# Patient Record
Sex: Female | Born: 1960 | Race: Black or African American | Hispanic: No | Marital: Single | Smoking: Former smoker
Health system: Southern US, Community
[De-identification: ages and names within clinical notes are randomized; demographics above are authoritative.]

## PROBLEM LIST (undated history)

## (undated) DIAGNOSIS — F32A Depression, unspecified: Secondary | ICD-10-CM

## (undated) DIAGNOSIS — S14124A Central cord syndrome at C4 level of cervical spinal cord, initial encounter: Secondary | ICD-10-CM

## (undated) DIAGNOSIS — R569 Unspecified convulsions: Secondary | ICD-10-CM

---

## 2007-02-08 ENCOUNTER — Emergency Department: Payer: Self-pay | Admitting: Emergency Medicine

## 2008-09-01 ENCOUNTER — Emergency Department: Payer: Self-pay | Admitting: Emergency Medicine

## 2010-11-15 ENCOUNTER — Emergency Department: Payer: Self-pay | Admitting: Emergency Medicine

## 2010-11-15 IMAGING — CR RIGHT FOOT - 2 VIEW
1 series · 2 of 2 positions shown · non-contrast
Comparison: none

REASON FOR EXAM: pain right lateral foot s/p fall yesterday
COMMENTS:   LMP: Post-Menopausal

PROCEDURE:     DXR - DXR FOOT RIGHT AP AND LATERAL  - [DATE]  [DATE]
RESULT:     No fracture, dislocation or other acute bony abnormality is
identified. Small plantar and Achilles calcaneal spurs are noted.

[Series 1: view not recorded · 0.17mm/px · 2 of 2 slices shown]
[im 1/2]
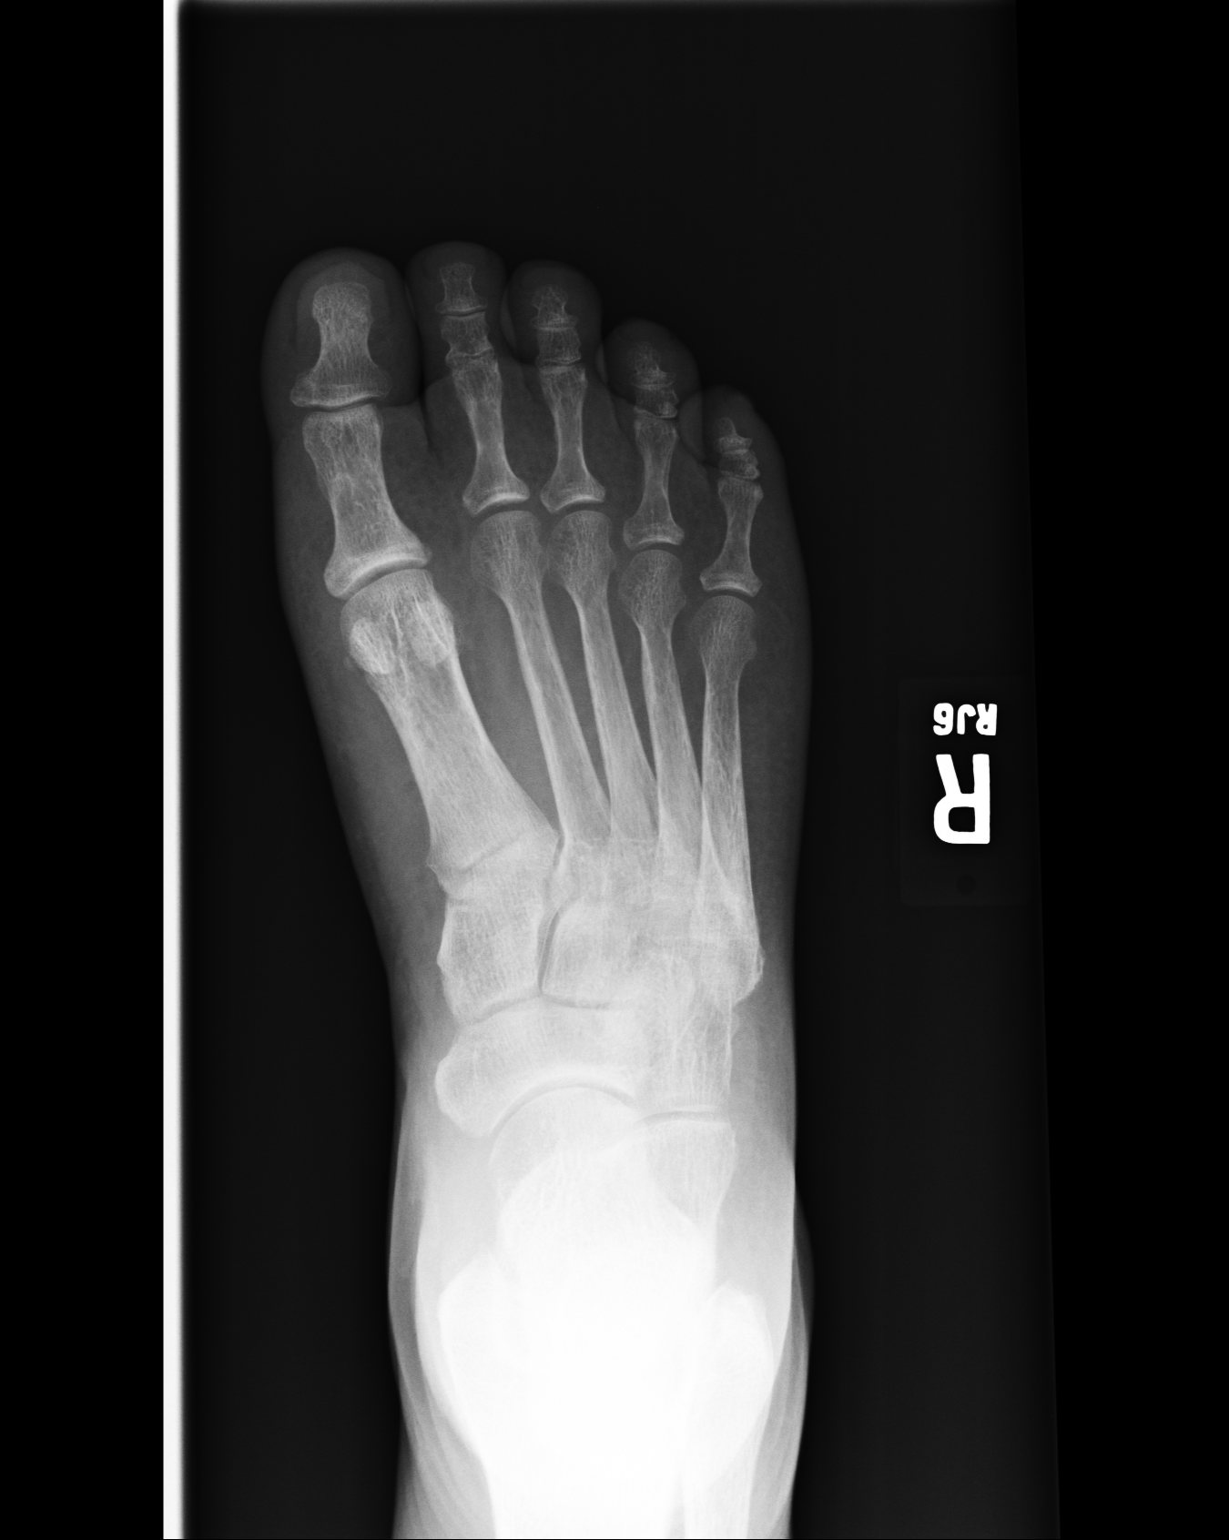
[im 2/2]
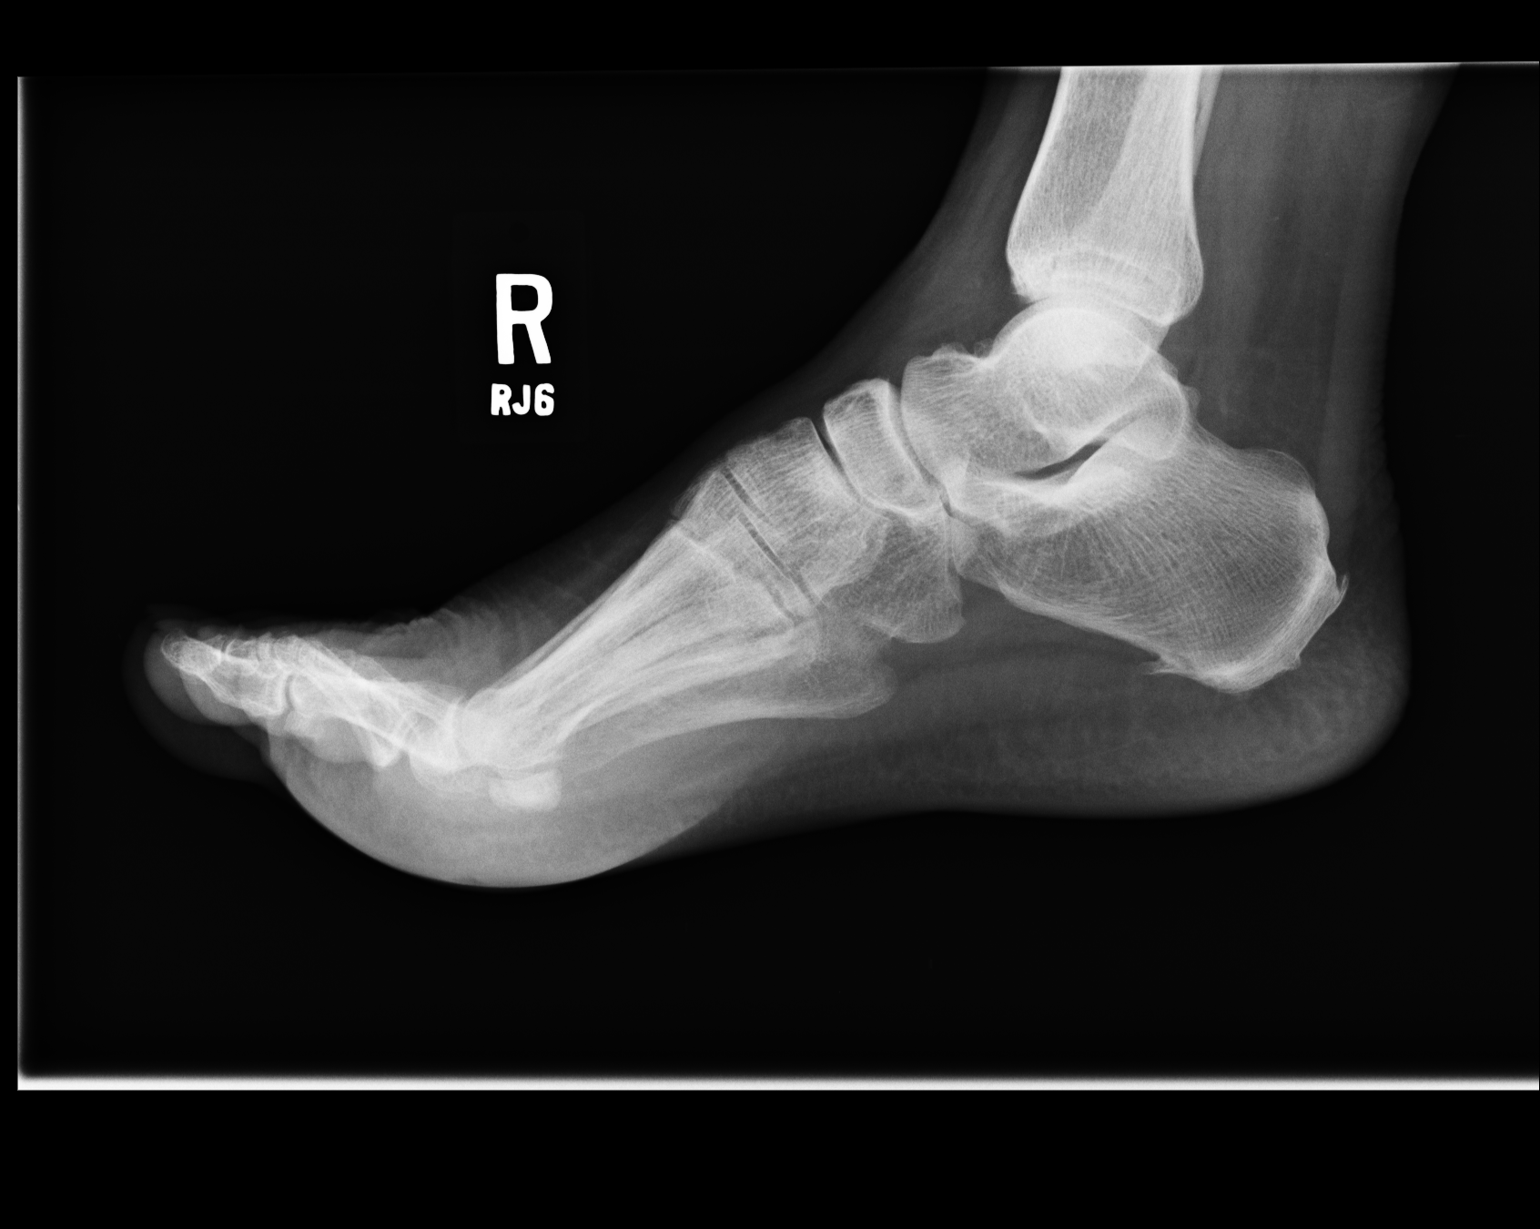

[2 of 2 positions shown; findings below may reference images not displayed]

IMPRESSION: 1.  No acute bony abnormalities are seen.
2.  No radiodense soft tissue foreign body is seen.
3.  Small plantar and Achilles calcaneal spurs are noted.

## 2013-01-05 ENCOUNTER — Emergency Department: Payer: Self-pay | Admitting: Emergency Medicine

## 2013-10-26 ENCOUNTER — Emergency Department: Payer: Self-pay | Admitting: Emergency Medicine

## 2017-12-30 ENCOUNTER — Other Ambulatory Visit: Payer: Self-pay

## 2017-12-30 ENCOUNTER — Emergency Department
Admission: EM | Admit: 2017-12-30 | Discharge: 2017-12-30 | Disposition: A | Discharge status: Other Acute Inpatient Hospital | Payer: Self-pay | Attending: Emergency Medicine | Admitting: Emergency Medicine

## 2017-12-30 DIAGNOSIS — X58XXXA Exposure to other specified factors, initial encounter: Secondary | ICD-10-CM | POA: Insufficient documentation

## 2017-12-30 DIAGNOSIS — T578X1A Toxic effect of other specified inorganic substances, accidental (unintentional), initial encounter: Secondary | ICD-10-CM | POA: Insufficient documentation

## 2017-12-30 DIAGNOSIS — F17200 Nicotine dependence, unspecified, uncomplicated: Secondary | ICD-10-CM | POA: Insufficient documentation

## 2017-12-30 DIAGNOSIS — R600 Localized edema: Secondary | ICD-10-CM

## 2017-12-30 DIAGNOSIS — Y999 Unspecified external cause status: Secondary | ICD-10-CM | POA: Insufficient documentation

## 2017-12-30 DIAGNOSIS — Y939 Activity, unspecified: Secondary | ICD-10-CM | POA: Insufficient documentation

## 2017-12-30 DIAGNOSIS — Y929 Unspecified place or not applicable: Secondary | ICD-10-CM | POA: Insufficient documentation

## 2017-12-30 DIAGNOSIS — T304 Corrosion of unspecified body region, unspecified degree: Secondary | ICD-10-CM

## 2017-12-30 DIAGNOSIS — T2065XA Corrosion of second degree of scalp [any part], initial encounter: Secondary | ICD-10-CM | POA: Insufficient documentation

## 2017-12-30 LAB — CBC WITH DIFFERENTIAL/PLATELET
BASOS PCT: 1 %
Basophils Absolute: 0 10*3/uL (ref 0–0.1)
EOS ABS: 0.5 10*3/uL (ref 0–0.7)
Eosinophils Relative: 5 %
HCT: 43 % (ref 35.0–47.0)
HEMOGLOBIN: 14.8 g/dL (ref 12.0–16.0)
Lymphocytes Relative: 9 %
Lymphs Abs: 0.8 10*3/uL — ABNORMAL LOW (ref 1.0–3.6)
MCH: 35.1 pg — ABNORMAL HIGH (ref 26.0–34.0)
MCHC: 34.5 g/dL (ref 32.0–36.0)
MCV: 101.7 fL — ABNORMAL HIGH (ref 80.0–100.0)
Monocytes Absolute: 0.9 10*3/uL (ref 0.2–0.9)
Monocytes Relative: 10 %
NEUTROS PCT: 75 %
Neutro Abs: 6.7 10*3/uL — ABNORMAL HIGH (ref 1.4–6.5)
Platelets: 243 10*3/uL (ref 150–440)
RBC: 4.22 MIL/uL (ref 3.80–5.20)
RDW: 13.5 % (ref 11.5–14.5)
WBC: 8.9 10*3/uL (ref 3.6–11.0)

## 2017-12-30 LAB — BASIC METABOLIC PANEL
Anion gap: 6 (ref 5–15)
BUN: 14 mg/dL (ref 6–20)
CHLORIDE: 102 mmol/L (ref 98–111)
CO2: 29 mmol/L (ref 22–32)
Calcium: 9.3 mg/dL (ref 8.9–10.3)
Creatinine, Ser: 1.02 mg/dL — ABNORMAL HIGH (ref 0.44–1.00)
GFR calc non Af Amer: 59 mL/min — ABNORMAL LOW (ref >60)
Glucose, Bld: 96 mg/dL (ref 70–99)
POTASSIUM: 4.1 mmol/L (ref 3.5–5.1)
SODIUM: 137 mmol/L (ref 135–145)

## 2017-12-30 MED ORDER — ONDANSETRON HCL 4 MG/2ML IJ SOLN
4.0000 mg | Freq: Once | INTRAMUSCULAR | Status: AC
  Administered 2017-12-30: 4 mg via INTRAVENOUS
  Filled 2017-12-30: qty 2

## 2017-12-30 MED ORDER — SODIUM CHLORIDE 0.9 % IV BOLUS
1000.0000 mL | Freq: Once | INTRAVENOUS | Status: AC
  Administered 2017-12-30: 1000 mL via INTRAVENOUS

## 2017-12-30 MED ORDER — TETANUS-DIPHTH-ACELL PERTUSSIS 5-2.5-18.5 LF-MCG/0.5 IM SUSP
0.5000 mL | Freq: Once | INTRAMUSCULAR | Status: AC
  Administered 2017-12-30: 0.5 mL via INTRAMUSCULAR
  Filled 2017-12-30: qty 0.5

## 2017-12-30 MED ORDER — FENTANYL CITRATE (PF) 100 MCG/2ML IJ SOLN
50.0000 ug | Freq: Once | INTRAMUSCULAR | Status: AC
  Administered 2017-12-30: 50 ug via INTRAVENOUS
  Filled 2017-12-30: qty 2

## 2017-12-30 NOTE — ED Triage Notes (Signed)
Pt c/o having an allergic reaction to a hair die that she used on Sunday .the patient has sever facial swelling. Denies any difficulty breathing

## 2017-12-30 NOTE — ED Provider Notes (Signed)
[facility] Emergency Department Provider Note ____________________________________________   First MD Initiated Contact with Patient 12/30/17 [phone]     (approximate)  I have reviewed the triage vital signs and the nursing notes.   HISTORY  Chief Complaint Allergic Reaction  HPI Yvette Phillips is a 57 y.o. female without any chronic medical conditions was presented to the emergency department today with a burn to her scalp.  Said that she used a hair dye this past Sunday which she used several years ago and had a more mild reaction.  Says that she washed her [place] but then started having a burning sensation to the scalp.  The burning has progressed and her scalp is weeping.  She also has pain to her right ear and weeping from the right ear.  This morning she came to the emergency department because her eyes have swollen shut.  She describes the pain as a burning pain.  Does not know the date of her last tetanus shot.   History reviewed. No pertinent past medical history.  There are no active problems to display for this patient.   History reviewed. No pertinent surgical history.  Prior to Admission medications   Not on File    Allergies Codeine and Penicillins  No family history on file.  Social History Social History   Tobacco Use  . Smoking status: Current Every Day Smoker  . Smokeless tobacco: Never Used  Substance Use Topics  . Alcohol use: Yes    Comment: occassionally  . Drug use: Not Currently    Review of Systems  Constitutional: No fever/chills Eyes: No visual changes. ENT: No sore throat. Cardiovascular: Denies chest pain. Respiratory: Denies shortness of breath. Gastrointestinal: No abdominal pain.  No nausea, no vomiting.  No diarrhea.  No constipation. Genitourinary: Negative for dysuria. Musculoskeletal: Negative for back pain. Skin: As above Neurological: Negative for headaches, focal weakness or  numbness.   ____________________________________________   PHYSICAL EXAM:  VITAL SIGNS: ED Triage Vitals  Enc Vitals Group     BP 12/30/17 0738 138/83     Pulse Rate 12/30/17 0738 90     Resp 12/30/17 0738 18     Temp 12/30/17 0738 98.8 F (37.1 C)     Temp Source 12/30/17 0738 Oral     SpO2 12/30/17 0738 100 %     Weight 12/30/17 0739 230 lb (104.3 kg)     Height 12/30/17 0739 5\' 7"  (1.702 m)     Head Circumference --      Peak Flow --      Pain Score 12/30/17 0739 6     Pain Loc --      Pain Edu? --      Excl. in GC? --     Constitutional: Alert and oriented. Well appearing and in no acute distress. Eyes: Severely edematous periorbital swelling.  Able to open the eyes, bilaterally and visualize the pupils which are 3 and reactive bilaterally.  Patient denies any blurred vision. Head: See skin exam below. Nose: No congestion/rhinnorhea. Mouth/Throat: Mucous membranes are moist.  Neck: No stridor.   Cardiovascular: Normal rate, regular rhythm. Grossly normal heart sounds.   Respiratory: Normal respiratory effort.  No retractions. Lungs CTAB. Gastrointestinal: Soft and nontender. No distention. No CVA tenderness. Musculoskeletal: No lower extremity tenderness nor edema.  No joint effusions. Neurologic:  Normal speech and language. No gross focal neurologic deficits are appreciated. Skin: Weeping scalp diffusely.  Severely swollen superior aspect of the right pinna which  is weeping and tense.  Scalp is erythematous.  I do not see any bullae.  The erythema extends down about 3 cm onto the forehead. Psychiatric: Mood and affect are normal. Speech and behavior are normal.  ____________________________________________   LABS (all labs ordered are listed, but only abnormal results are displayed)  Labs Reviewed  CBC WITH DIFFERENTIAL/PLATELET  BASIC METABOLIC PANEL    ____________________________________________  EKG   ____________________________________________  RADIOLOGY   ____________________________________________   PROCEDURES  Procedure(s) performed:   Procedures  Critical Care performed:   ____________________________________________   INITIAL IMPRESSION / ASSESSMENT AND PLAN / ED COURSE  Pertinent labs & imaging results that were available during my care of the patient were reviewed by me and considered in my medical decision making (see chart for details).  DDX: Chemical burn, allergic reaction, first-degree burn, second-degree burn, burn of the right ear. As part of my medical decision making, I reviewed the following data within the electronic MEDICAL RECORD NUMBER Notes from prior ED visits  ----------------------------------------- 8:25 AM on 12/30/2017 -----------------------------------------  Discussed the case with the burn fellow, Dr.Sljizic, who [facility]  Accepting doctor is Dr. Yetta BarreJones.  Patient given tetanus shot as well as pain control.  She is aware of her disposition and willing to comply. ____________________________________________   FINAL CLINICAL IMPRESSION(S) / ED DIAGNOSES  Chemical burn.  Periorbital edema.  NEW MEDICATIONS STARTED DURING THIS VISIT:  New Prescriptions   No medications on file     Note:  This document was prepared using Dragon voice recognition software and may include unintentional dictation errors.     Myrna BlazerSchaevitz, Daeshawn Redmann Matthew, MD 12/30/17 [phone]

## 2017-12-30 NOTE — ED Notes (Signed)
[facility]  CALLED  FOR  TRANSPORT 

## 2017-12-30 NOTE — ED Notes (Signed)
EMTALA reviewed by charge RN 

## 2020-05-21 DIAGNOSIS — F1010 Alcohol abuse, uncomplicated: Secondary | ICD-10-CM | POA: Diagnosis present

## 2020-05-21 DIAGNOSIS — S14129A Central cord syndrome at unspecified level of cervical spinal cord, initial encounter: Secondary | ICD-10-CM | POA: Diagnosis present

## 2020-05-31 ENCOUNTER — Emergency Department
Admission: EM | Admit: 2020-05-31 | Discharge: 2020-05-31 | Disposition: A | Discharge status: Home / Self Care | Payer: Medicaid Other | Attending: Emergency Medicine | Admitting: Emergency Medicine

## 2020-05-31 ENCOUNTER — Other Ambulatory Visit: Payer: Self-pay

## 2020-05-31 ENCOUNTER — Emergency Department: Payer: Medicaid Other

## 2020-05-31 DIAGNOSIS — R531 Weakness: Secondary | ICD-10-CM | POA: Insufficient documentation

## 2020-05-31 DIAGNOSIS — F17200 Nicotine dependence, unspecified, uncomplicated: Secondary | ICD-10-CM | POA: Insufficient documentation

## 2020-05-31 DIAGNOSIS — M25562 Pain in left knee: Secondary | ICD-10-CM | POA: Insufficient documentation

## 2020-05-31 DIAGNOSIS — W19XXXA Unspecified fall, initial encounter: Secondary | ICD-10-CM

## 2020-05-31 DIAGNOSIS — S50311A Abrasion of right elbow, initial encounter: Secondary | ICD-10-CM | POA: Diagnosis not present

## 2020-05-31 DIAGNOSIS — W010XXA Fall on same level from slipping, tripping and stumbling without subsequent striking against object, initial encounter: Secondary | ICD-10-CM | POA: Insufficient documentation

## 2020-05-31 DIAGNOSIS — S50312A Abrasion of left elbow, initial encounter: Secondary | ICD-10-CM | POA: Insufficient documentation

## 2020-05-31 LAB — BASIC METABOLIC PANEL
Anion gap: 8 (ref 5–15)
BUN: 16 mg/dL (ref 8–23)
CO2: 26 mmol/L (ref 22–32)
Calcium: 9.2 mg/dL (ref 8.9–10.3)
Chloride: 104 mmol/L (ref 98–111)
Creatinine, Ser: 0.68 mg/dL (ref 0.44–1.00)
GFR, Estimated: >60 mL/min (ref >60)
Glucose, Bld: 113 mg/dL — ABNORMAL HIGH (ref 70–99)
Potassium: 3.9 mmol/L (ref 3.5–5.1)
Sodium: 138 mmol/L (ref 135–145)

## 2020-05-31 LAB — CBC
HCT: 44.8 % (ref 36.0–46.0)
Hemoglobin: 15 g/dL (ref 12.0–15.0)
MCH: 38 pg — ABNORMAL HIGH (ref 26.0–34.0)
MCHC: 33.5 g/dL (ref 30.0–36.0)
MCV: 113.4 fL — ABNORMAL HIGH (ref 80.0–100.0)
Platelets: 338 10*3/uL (ref 150–400)
RBC: 3.95 MIL/uL (ref 3.87–5.11)
RDW: 13.6 % (ref 11.5–15.5)
WBC: 6.7 10*3/uL (ref 4.0–10.5)
nRBC: 0 % (ref 0.0–0.2)

## 2020-05-31 IMAGING — DX DG KNEE COMPLETE 4+V*L*
4 series · 4 of 4 positions shown · non-contrast
Comparison: None.

CLINICAL DATA: Left knee pain, fell from standing at 4 p.m.

EXAM:
LEFT KNEE - COMPLETE 4+ VIEW

[knee ap]
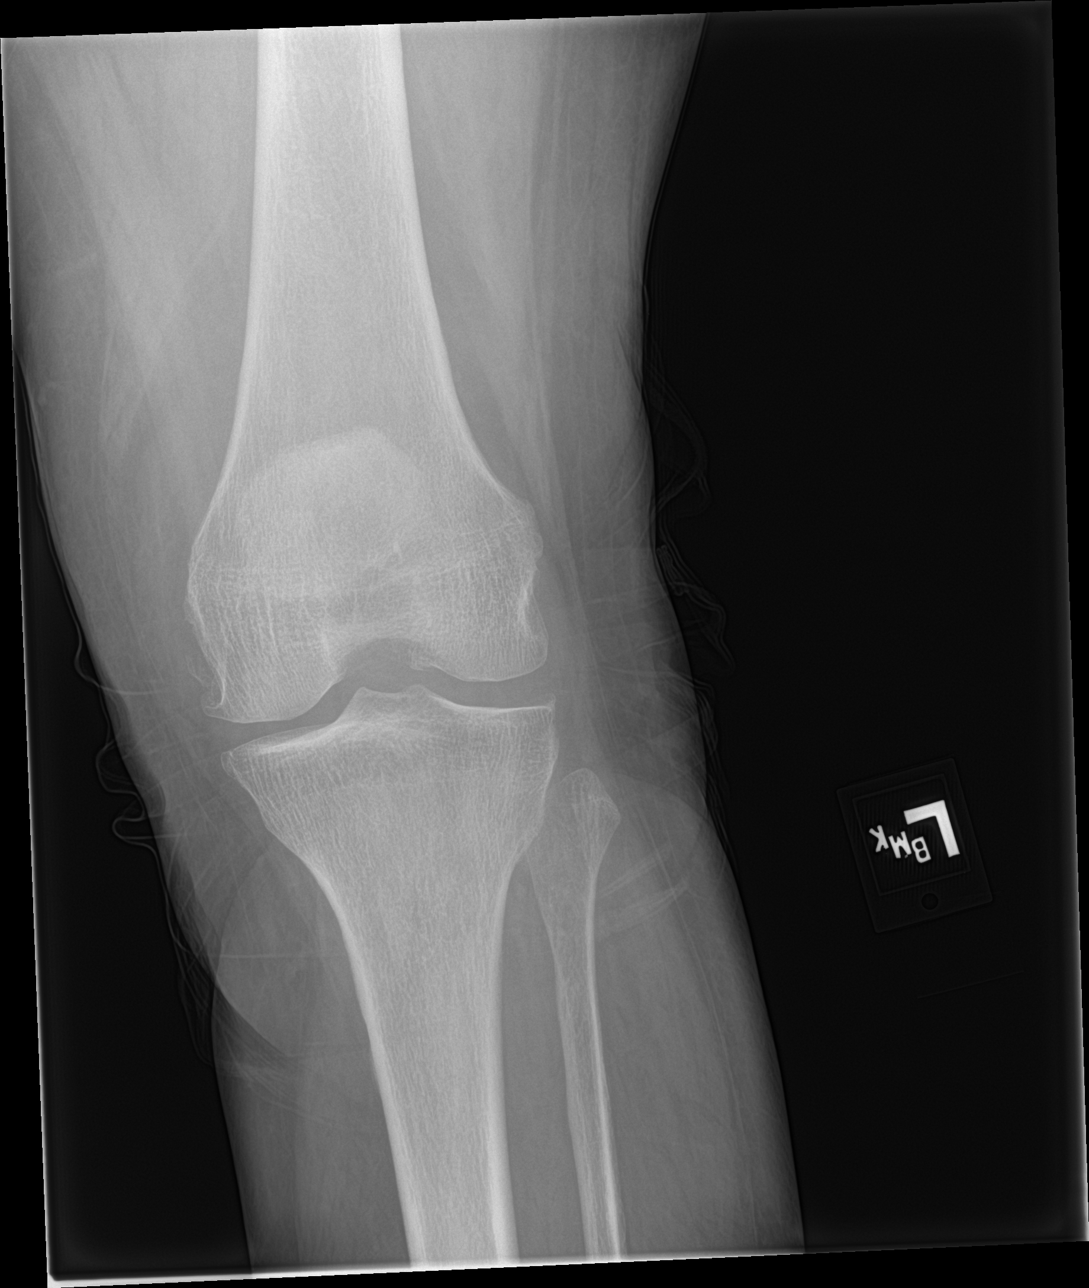

[knee tunnel]
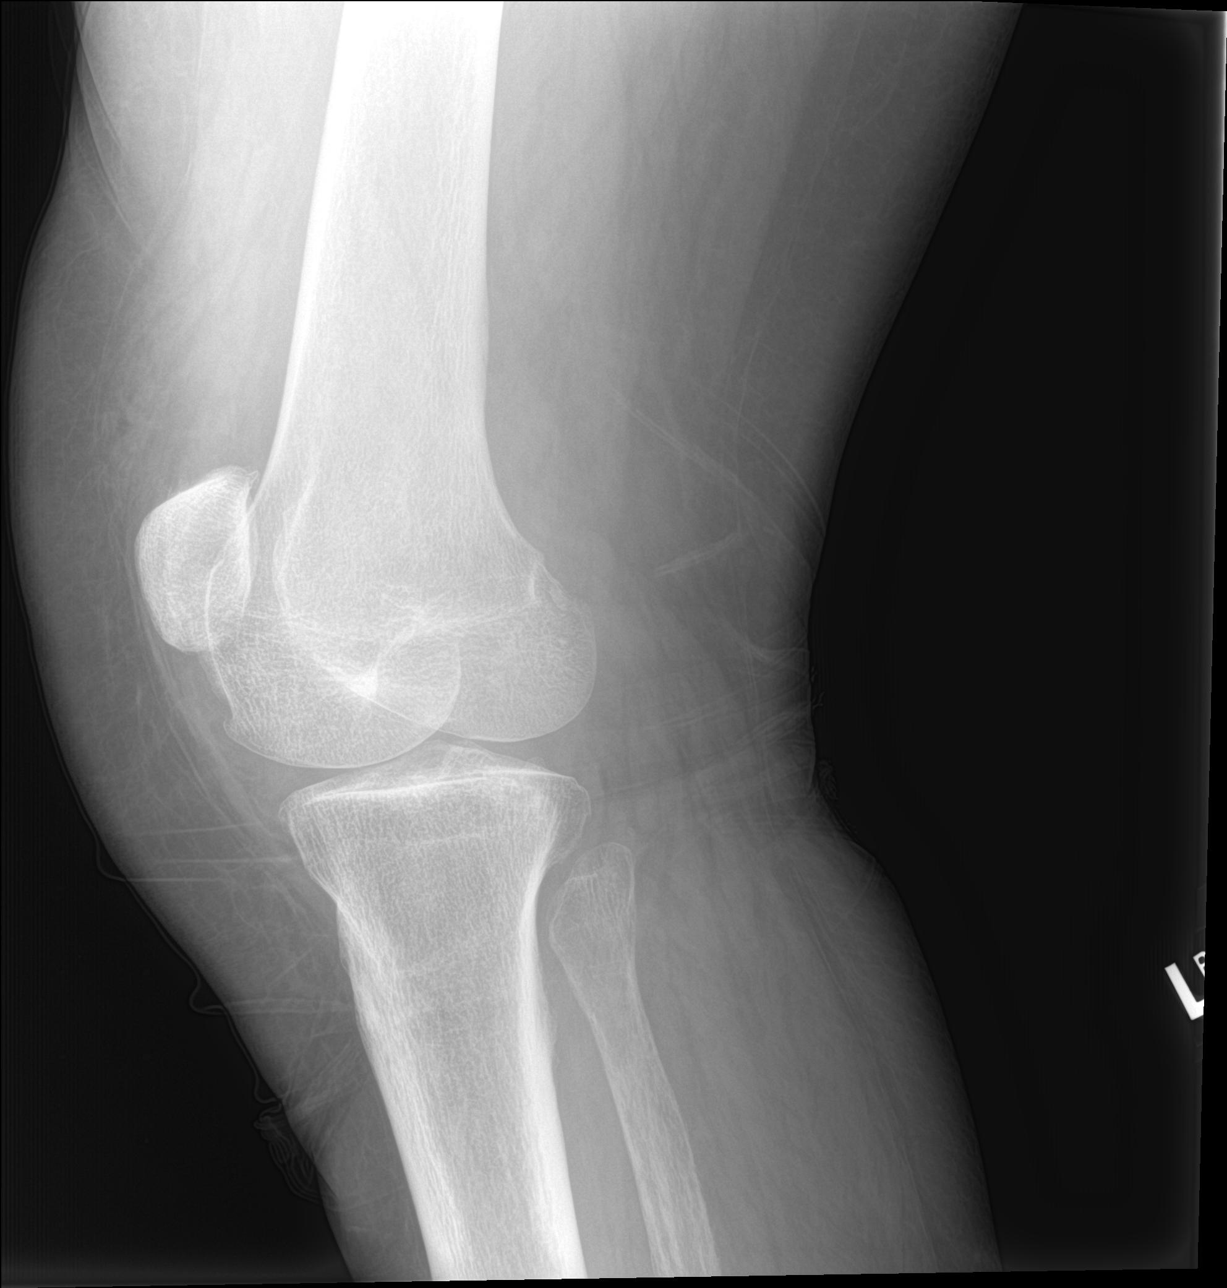

[knee lat]
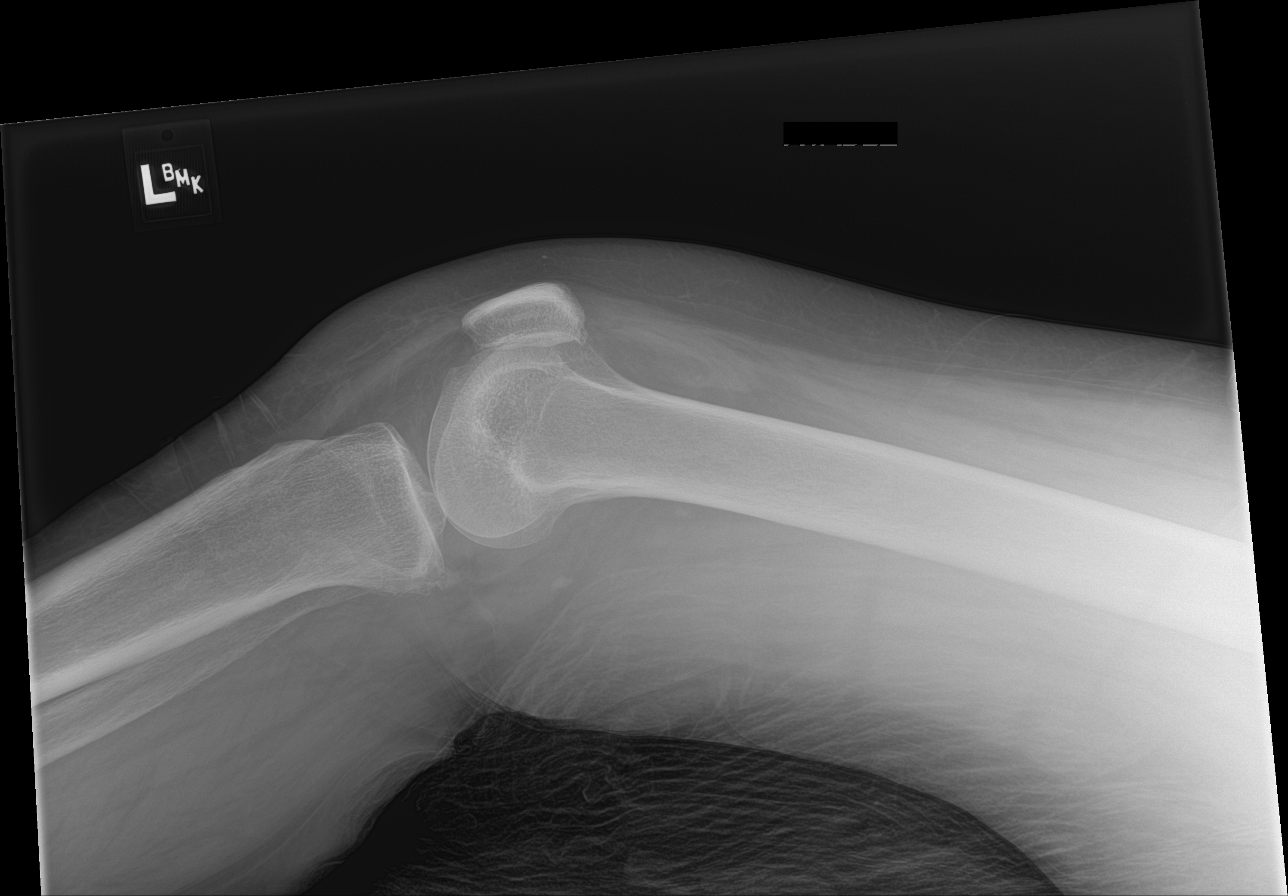

[knee obl]
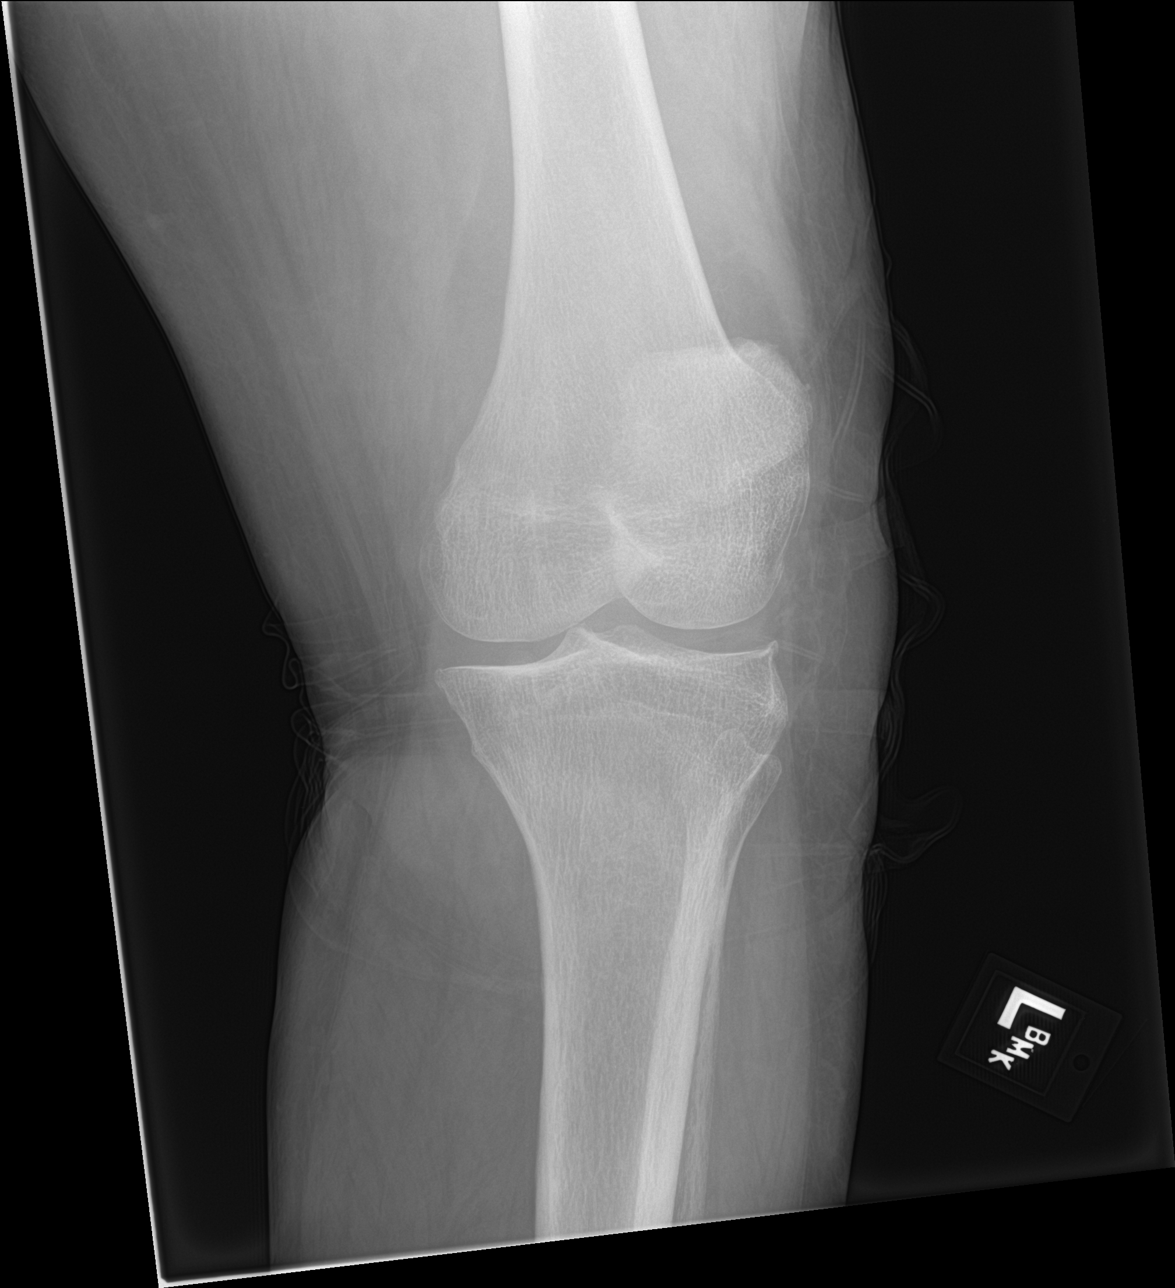

[4 of 4 positions shown; findings below may reference images not displayed]

FINDINGS: Frontal, bilateral oblique, and cross-table lateral views of the
left knee are obtained. There is mild 3 compartmental osteoarthritis
greatest in the medial compartment. No fracture, subluxation, or
dislocation. Trace joint effusion is likely reactive. Soft tissues
are otherwise unremarkable.
IMPRESSION: 1. Mild 3 compartmental osteoarthritis with trace reactive joint
effusion.
2. No acute fracture.

## 2020-05-31 MED ORDER — ACETAMINOPHEN 500 MG PO TABS
1000.0000 mg | ORAL_TABLET | Freq: Once | ORAL | Status: AC
  Administered 2020-05-31: 1000 mg via ORAL
  Filled 2020-05-31: qty 2

## 2020-05-31 NOTE — ED Triage Notes (Signed)
Pt to ED for chief complaint of fall and right sided weakness since christmas day when discharged from [facility] Denies hitting head with fall or blood thinners  Pt alert and oriented, speaking in complete sentences, clear speech.    Pt states she feels the same as when she left [facility], not worse. Discussed pt with DR Siadecki. No head CT at this time

## 2020-05-31 NOTE — ED Notes (Signed)
ED Provider at bedside. 

## 2020-05-31 NOTE — ED Triage Notes (Signed)
Pt comes into the ED via EMS from home with c/o trip and fall with c/o BL knee pain, right worse then left. States she was recently seen at [facility] for the right leg weakness a week ago and did an MRI and told her it was a pinched nerve. 85HR 140/70 100%RA

## 2020-05-31 NOTE — Discharge Instructions (Signed)
Please seek medical attention for any high fevers, chest pain, shortness of breath, change in behavior, persistent vomiting, bloody stool or any other new or concerning symptoms.  

## 2020-05-31 NOTE — ED Provider Notes (Signed)
[facility] Emergency Department Provider Note   ____________________________________________   I have reviewed the triage vital signs and the nursing notes.   HISTORY  Chief Complaint Left knee pain  History limited by: Not Limited   HPI Yvette Phillips is a 60 y.o. female who presents to the emergency department today with primary complaint of left knee pain after a fall. The patient states that she has been having weakness since an admission to [facility]. The patient states that her weakness has been the same as it was when she left the hospital. The patient denies hitting her head with the fall today. Denies any chest pain or shortness of breath.   Records reviewed. Per medical record review patient has a history of recent admission to [facility]. 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 Was evaluated by neurosurgery there who felt outpatient follow up was appropriate.    Prior to Admission medications   Not on File    Allergies Codeine and Penicillins  No family history on file.  Social History Social History   Tobacco Use  . Smoking status: Current Every Day Smoker  . Smokeless tobacco: Never Used  Substance Use Topics  . Alcohol use: Yes    Comment: occassionally  . Drug use: Not Currently    Review of Systems Constitutional: No fever/chills Eyes: No visual changes. ENT: No sore throat. Cardiovascular: Denies chest pain. Respiratory: Denies shortness of breath. Gastrointestinal: No abdominal pain.  No nausea, no vomiting.  No diarrhea.   Genitourinary: Negative for dysuria. Musculoskeletal: Positive for left knee pain. Skin: Negative for rash. Neurological: Positive for right arm numbness and weakness.   ____________________________________________   PHYSICAL EXAM:  VITAL SIGNS: ED Triage Vitals  Enc Vitals Group     BP 05/31/20 1558 135/79     Pulse Rate 05/31/20 1558 86     Resp  05/31/20 1558 20     Temp 05/31/20 1558 98.1 F (36.7 C)     Temp Source 05/31/20 1558 Oral     SpO2 05/31/20 1558 99 %     Weight 05/31/20 1602 230 lb (104.3 kg)     Height 05/31/20 1602 5\' 7"  (1.702 m)     Head Circumference --      Peak Flow --      Pain Score 05/31/20 1602 0   Constitutional: Alert and oriented.  Eyes: Conjunctivae are normal.  ENT      Head: Normocephalic and atraumatic.      Nose: No congestion/rhinnorhea.      Mouth/Throat: Mucous membranes are moist.      Neck: No stridor. Hematological/Lymphatic/Immunilogical: No cervical lymphadenopathy. Cardiovascular: Normal rate, regular rhythm.  No murmurs, rubs, or gallops.  Respiratory: Normal respiratory effort without tachypnea nor retractions. Breath sounds are clear and equal bilaterally. No wheezes/rales/rhonchi. Gastrointestinal: Soft and non tender. No rebound. No guarding.  Genitourinary: Deferred Musculoskeletal: Normal range of motion in all extremities. Small effusion to left knee. No erythema.  Neurologic:  Strength decreased in right upper arm.  Skin: Abrasion to bilateral elbows  Psychiatric: Mood and affect are normal. Speech and behavior are normal. Patient exhibits appropriate insight and judgment.  ____________________________________________    LABS (pertinent positives/negatives)  BMP wnl except glu 113 CBC wbc 6.7, hgb 15.0, plt 338  ____________________________________________   EKG  I, 07/29/20, attending physician, personally viewed and interpreted this EKG  EKG Time: 1600 Rate: 80 Rhythm: normal sinus rhythm  Axis: left axis deviation Intervals: qtc 440 QRS: narrow, q waves v1, LVH ST changes: no st elevation Impression: abnormal ekg   ____________________________________________    RADIOLOGY  None  ____________________________________________   PROCEDURES  Procedures  ____________________________________________   INITIAL IMPRESSION / ASSESSMENT AND  PLAN / ED COURSE  Pertinent labs & imaging results that were available during my care of the patient were reviewed by me and considered in my medical decision making (see chart for details).   Patient presented to the emergency department today because of concerns for left knee pain after a fall.  On exam patient does have a small effusion to the left knee.  X-ray was performed and not show any acute fracture or dislocation.  Patient does have some weakness but this has been constant since her recent admission to [facility].  Per chart review she was evaluated by neurosurgery and they are planning on following up with her as an outpatient.  I did reinforce with patient importance of neurosurgery follow-up.   ____________________________________________   FINAL CLINICAL IMPRESSION(S) / ED DIAGNOSES  Final diagnoses:  Acute pain of left knee  Fall, initial encounter     Note: This dictation was prepared with Nurse, children's dictation. Any transcriptional errors that result from this process are unintentional     Phineas Semen, MD 05/31/20 [phone]

## 2020-07-03 ENCOUNTER — Emergency Department: Payer: Medicaid Other

## 2020-07-03 ENCOUNTER — Inpatient Hospital Stay
Admission: EM | Admit: 2020-07-03 | Discharge: 2020-07-11 | DRG: 029 | Disposition: A | Discharge status: Other Acute Inpatient Hospital | Payer: Medicaid Other | Attending: Internal Medicine | Admitting: Internal Medicine

## 2020-07-03 ENCOUNTER — Other Ambulatory Visit: Payer: Self-pay

## 2020-07-03 DIAGNOSIS — E876 Hypokalemia: Secondary | ICD-10-CM | POA: Diagnosis not present

## 2020-07-03 DIAGNOSIS — R030 Elevated blood-pressure reading, without diagnosis of hypertension: Secondary | ICD-10-CM | POA: Diagnosis present

## 2020-07-03 DIAGNOSIS — S14124A Central cord syndrome at C4 level of cervical spinal cord, initial encounter: Secondary | ICD-10-CM | POA: Diagnosis present

## 2020-07-03 DIAGNOSIS — R296 Repeated falls: Secondary | ICD-10-CM | POA: Diagnosis present

## 2020-07-03 DIAGNOSIS — F1910 Other psychoactive substance abuse, uncomplicated: Secondary | ICD-10-CM | POA: Diagnosis present

## 2020-07-03 DIAGNOSIS — L8990 Pressure ulcer of unspecified site, unspecified stage: Secondary | ICD-10-CM | POA: Insufficient documentation

## 2020-07-03 DIAGNOSIS — Z716 Tobacco abuse counseling: Secondary | ICD-10-CM | POA: Diagnosis not present

## 2020-07-03 DIAGNOSIS — W19XXXA Unspecified fall, initial encounter: Secondary | ICD-10-CM | POA: Diagnosis present

## 2020-07-03 DIAGNOSIS — R531 Weakness: Secondary | ICD-10-CM | POA: Diagnosis present

## 2020-07-03 DIAGNOSIS — R1319 Other dysphagia: Secondary | ICD-10-CM | POA: Diagnosis not present

## 2020-07-03 DIAGNOSIS — M50021 Cervical disc disorder at C4-C5 level with myelopathy: Secondary | ICD-10-CM | POA: Diagnosis present

## 2020-07-03 DIAGNOSIS — S14154A Other incomplete lesion at C4 level of cervical spinal cord, initial encounter: Secondary | ICD-10-CM | POA: Diagnosis present

## 2020-07-03 DIAGNOSIS — Z7151 Drug abuse counseling and surveillance of drug abuser: Secondary | ICD-10-CM

## 2020-07-03 DIAGNOSIS — F17210 Nicotine dependence, cigarettes, uncomplicated: Secondary | ICD-10-CM | POA: Diagnosis present

## 2020-07-03 DIAGNOSIS — Z6836 Body mass index (BMI) 36.0-36.9, adult: Secondary | ICD-10-CM

## 2020-07-03 DIAGNOSIS — Z20822 Contact with and (suspected) exposure to covid-19: Secondary | ICD-10-CM | POA: Diagnosis present

## 2020-07-03 DIAGNOSIS — Z880 Allergy status to penicillin: Secondary | ICD-10-CM | POA: Diagnosis not present

## 2020-07-03 DIAGNOSIS — Z885 Allergy status to narcotic agent status: Secondary | ICD-10-CM

## 2020-07-03 DIAGNOSIS — L89322 Pressure ulcer of left buttock, stage 2: Secondary | ICD-10-CM | POA: Diagnosis not present

## 2020-07-03 DIAGNOSIS — Z981 Arthrodesis status: Secondary | ICD-10-CM

## 2020-07-03 DIAGNOSIS — D7589 Other specified diseases of blood and blood-forming organs: Secondary | ICD-10-CM

## 2020-07-03 DIAGNOSIS — S14129A Central cord syndrome at unspecified level of cervical spinal cord, initial encounter: Secondary | ICD-10-CM

## 2020-07-03 DIAGNOSIS — Z8616 Personal history of COVID-19: Secondary | ICD-10-CM

## 2020-07-03 DIAGNOSIS — M25562 Pain in left knee: Secondary | ICD-10-CM | POA: Diagnosis present

## 2020-07-03 DIAGNOSIS — M4802 Spinal stenosis, cervical region: Secondary | ICD-10-CM | POA: Diagnosis present

## 2020-07-03 DIAGNOSIS — Z419 Encounter for procedure for purposes other than remedying health state, unspecified: Secondary | ICD-10-CM

## 2020-07-03 DIAGNOSIS — M2578 Osteophyte, vertebrae: Secondary | ICD-10-CM | POA: Diagnosis present

## 2020-07-03 DIAGNOSIS — M5000 Cervical disc disorder with myelopathy, unspecified cervical region: Secondary | ICD-10-CM

## 2020-07-03 DIAGNOSIS — F1010 Alcohol abuse, uncomplicated: Secondary | ICD-10-CM | POA: Diagnosis present

## 2020-07-03 DIAGNOSIS — R1314 Dysphagia, pharyngoesophageal phase: Secondary | ICD-10-CM

## 2020-07-03 DIAGNOSIS — R1310 Dysphagia, unspecified: Secondary | ICD-10-CM

## 2020-07-03 DIAGNOSIS — D62 Acute posthemorrhagic anemia: Secondary | ICD-10-CM

## 2020-07-03 LAB — URINALYSIS, COMPLETE (UACMP) WITH MICROSCOPIC
Glucose, UA: NEGATIVE mg/dL
Ketones, ur: 80 mg/dL — AB
Nitrite: NEGATIVE
Protein, ur: NEGATIVE mg/dL
Specific Gravity, Urine: 1.028 (ref 1.005–1.030)
pH: 5 (ref 5.0–8.0)

## 2020-07-03 LAB — CBC WITH DIFFERENTIAL/PLATELET
Abs Immature Granulocytes: 0.03 10*3/uL (ref 0.00–0.07)
Basophils Absolute: 0.1 10*3/uL (ref 0.0–0.1)
Basophils Relative: 1 %
Eosinophils Absolute: 0.1 10*3/uL (ref 0.0–0.5)
Eosinophils Relative: 1 %
HCT: 46.5 % — ABNORMAL HIGH (ref 36.0–46.0)
Hemoglobin: 15.5 g/dL — ABNORMAL HIGH (ref 12.0–15.0)
Immature Granulocytes: 0 %
Lymphocytes Relative: 23 %
Lymphs Abs: 2.1 10*3/uL (ref 0.7–4.0)
MCH: 35.6 pg — ABNORMAL HIGH (ref 26.0–34.0)
MCHC: 33.3 g/dL (ref 30.0–36.0)
MCV: 106.7 fL — ABNORMAL HIGH (ref 80.0–100.0)
Monocytes Absolute: 0.9 10*3/uL (ref 0.1–1.0)
Monocytes Relative: 9 %
Neutro Abs: 6.1 10*3/uL (ref 1.7–7.7)
Neutrophils Relative %: 66 %
Platelets: 287 10*3/uL (ref 150–400)
RBC: 4.36 MIL/uL (ref 3.87–5.11)
RDW: 12.9 % (ref 11.5–15.5)
WBC: 9.3 10*3/uL (ref 4.0–10.5)
nRBC: 0 % (ref 0.0–0.2)

## 2020-07-03 LAB — BASIC METABOLIC PANEL
Anion gap: 17 — ABNORMAL HIGH (ref 5–15)
BUN: 13 mg/dL (ref 8–23)
CO2: 24 mmol/L (ref 22–32)
Calcium: 9.9 mg/dL (ref 8.9–10.3)
Chloride: 95 mmol/L — ABNORMAL LOW (ref 98–111)
Creatinine, Ser: 1.01 mg/dL — ABNORMAL HIGH (ref 0.44–1.00)
GFR, Estimated: >60 mL/min (ref >60)
Glucose, Bld: 75 mg/dL (ref 70–99)
Potassium: 4.1 mmol/L (ref 3.5–5.1)
Sodium: 136 mmol/L (ref 135–145)

## 2020-07-03 LAB — CK: Total CK: 375 U/L — ABNORMAL HIGH (ref 38–234)

## 2020-07-03 IMAGING — MR MR HEAD W/O CM
11 series · 42 of 48 positions shown · non-contrast
Comparison: None.

CLINICAL DATA: Found down

EXAM:
MRI HEAD WITHOUT CONTRAST
TECHNIQUE: Multiplanar, multiecho pulse sequences of the brain and surrounding
structures were obtained without intravenous contrast.

[Series 5: ax dwi_tracew · axial · 3.0mm · 0.60mm/px · z∈[-140,+4]mm · 3 of 48 slices shown]
[im 1/48]
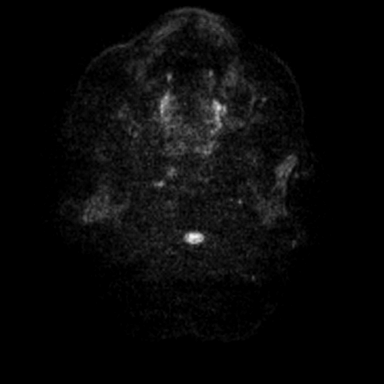
[im 24/48]
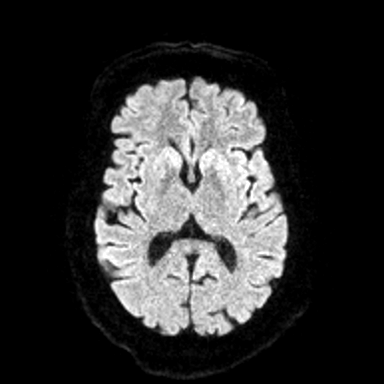
[im 48/48]
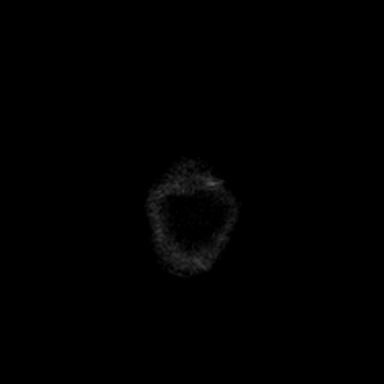

[Series 6: ax dwi_adc · axial · 3.0mm · 0.60mm/px · z∈[-140,+4]mm · 4 of 48 slices shown]
[im 1/48]
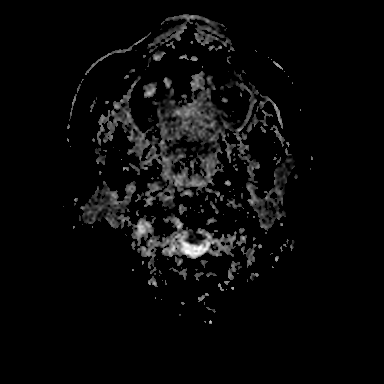
[im 16/48]
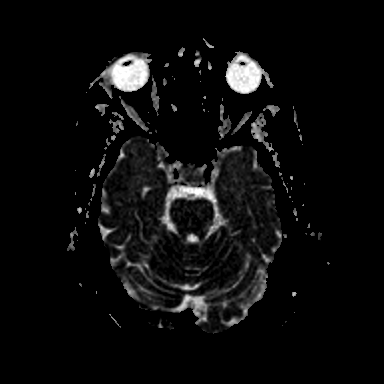
[im 32/48]
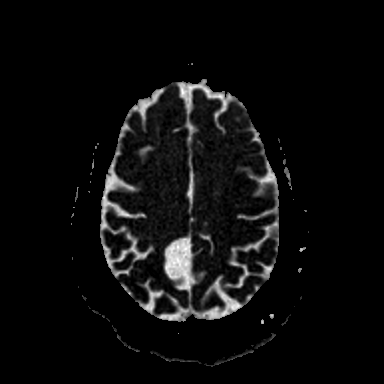
[im 48/48]
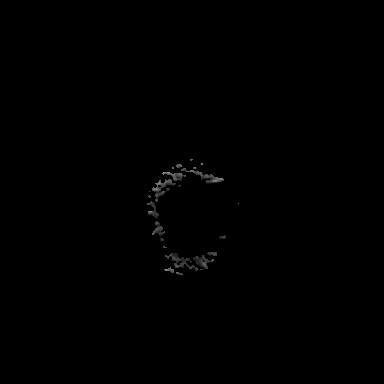

[Series 7: cor dwi_tracew · coronal · 5.0mm · 0.60mm/px · 3 of 38 slices shown]
[im 1/38]
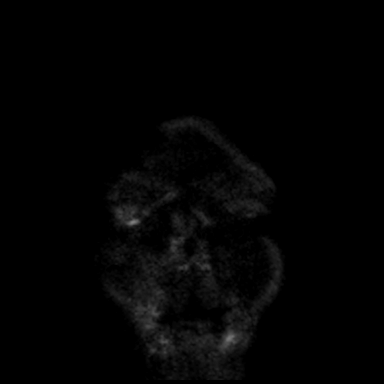
[im 19/38]
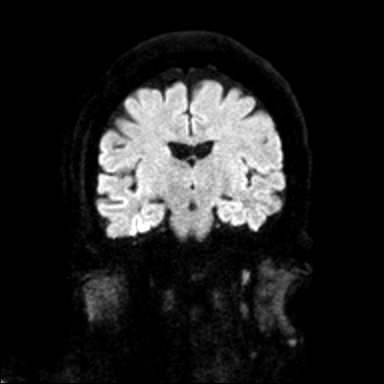
[im 38/38]
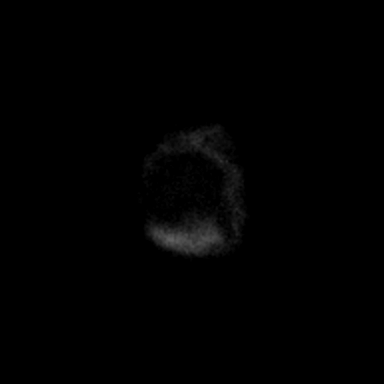

[Series 8: cor dwi_adc · coronal · 5.0mm · 0.60mm/px · 3 of 38 slices shown]
[im 1/38]
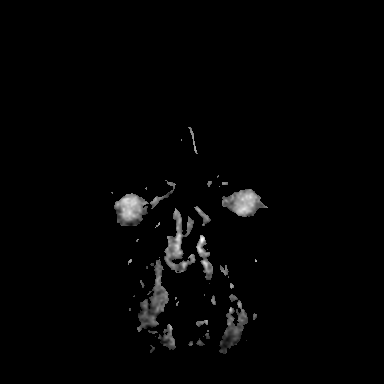
[im 19/38]
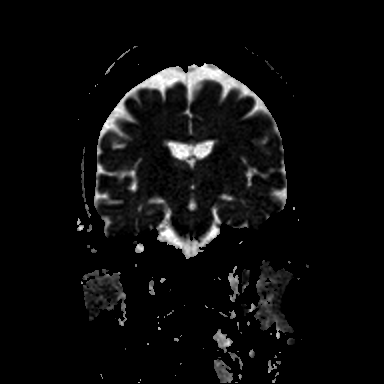
[im 38/38]
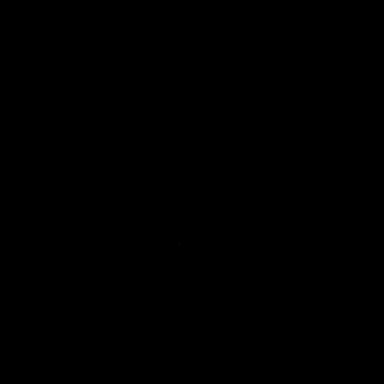

[Series 9: T1 · sagittal · 5.0mm · 0.62mm/px · 2 of 25 slices shown (1 of 2)]
[im 1/25]
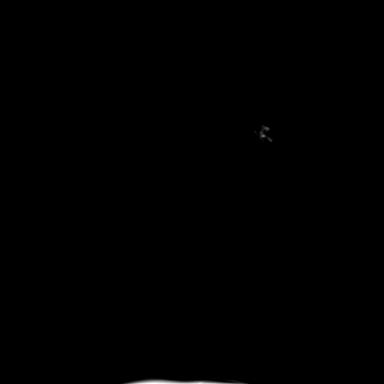
[im 25/25]
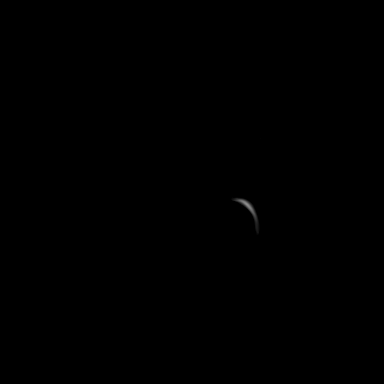

[Series 10: T2 · axial · 5.0mm · 0.53mm/px · z∈[-142,+3]mm · 2 of 27 slices shown (1 of 2)]
[im 1/27]
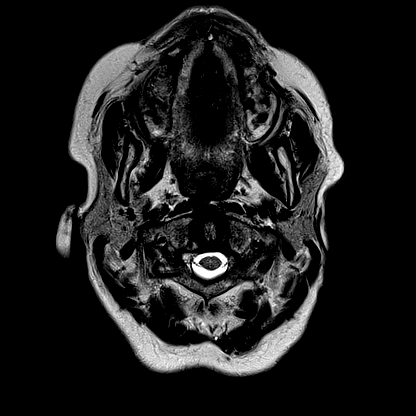
[im 27/27]
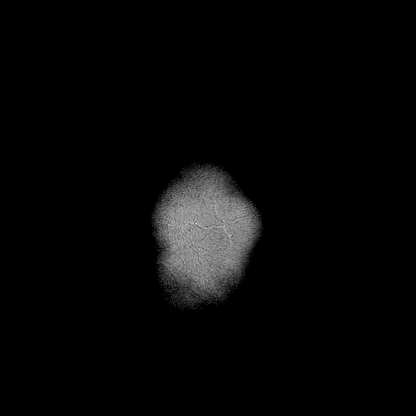

[Series 12: pha_images · axial · 3.0mm · 0.90mm/px · z∈[-149,+12]mm · 5 of 59 slices shown]
[im 1/59]
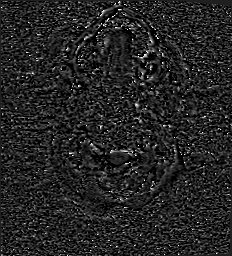
[im 15/59]
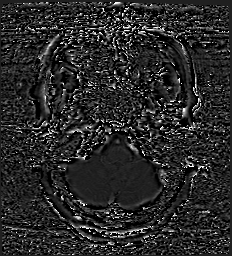
[im 30/59]
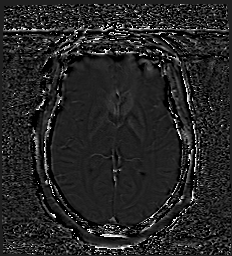
[im 44/59]
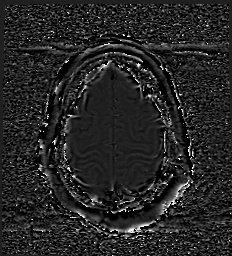
[im 59/59]
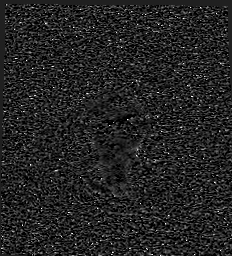

[Series 13: swi_images · axial · 3.0mm · 0.90mm/px · z∈[-152,+12]mm · 5 of 60 slices shown]
[im 1/60]
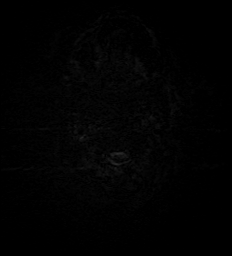
[im 15/60]
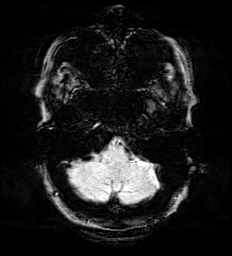
[im 30/60]
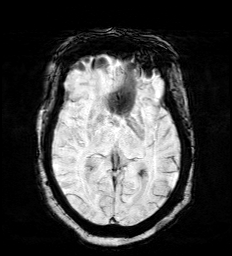
[im 45/60]
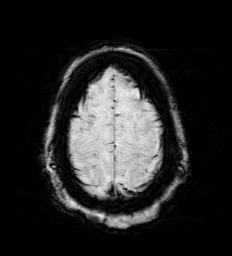
[im 60/60]
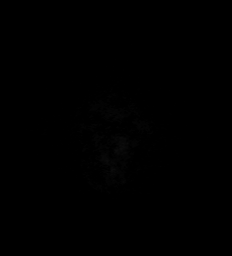

[Series 15: FLAIR · axial · 3.0mm · 0.53mm/px · z∈[-144,+6]mm · 4 of 55 slices shown]
[im 1/55]
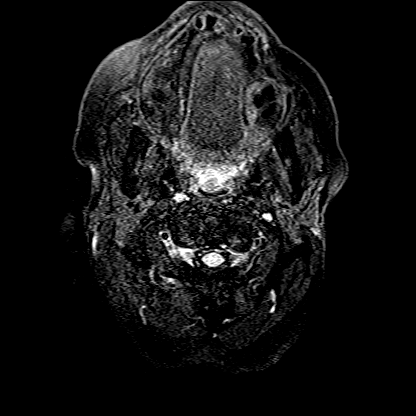
[im 19/55]
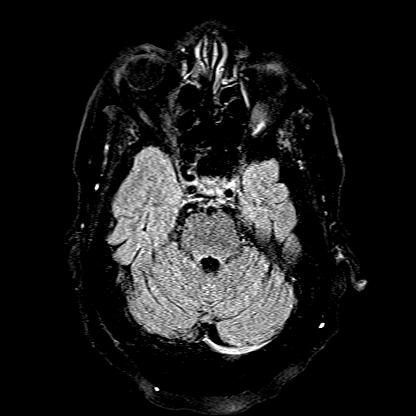
[im 37/55]
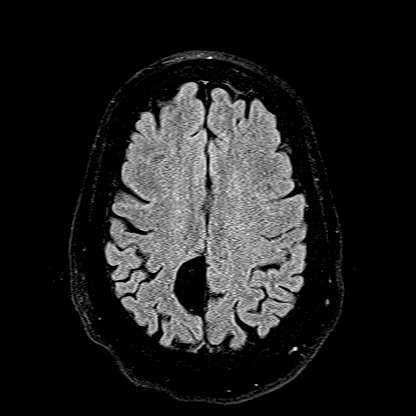
[im 55/55]
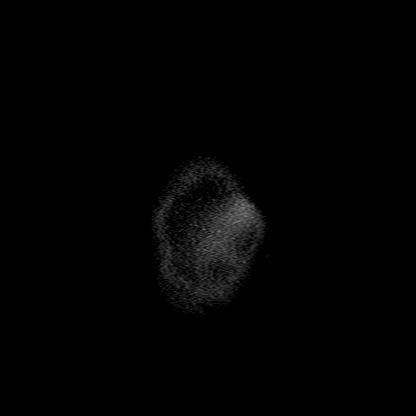

[Series 16: T1 · axial · 1.0mm · 0.98mm/px · z∈[-156,+6]mm · 8 of 176 slices shown (2 of 2)]
[im 1/176]
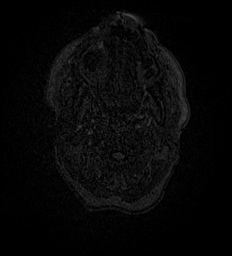
[im 27/176]
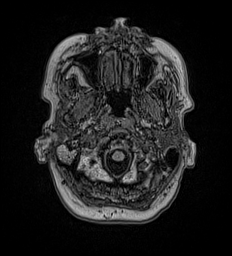
[im 54/176]
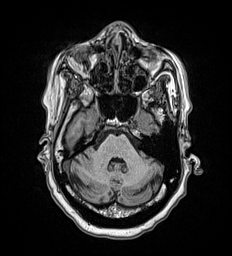
[im 81/176]
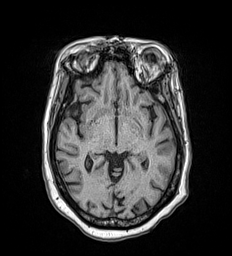
[im 95/176]
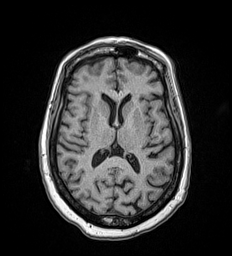
[im 122/176]
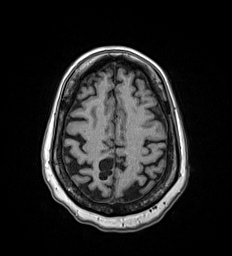
[im 149/176]
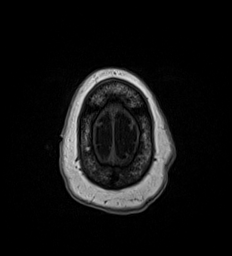
[im 176/176]
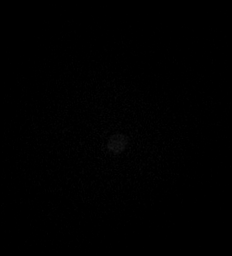

[Series 17: T2 · coronal · 5.0mm · 0.45mm/px · 3 of 32 slices shown (2 of 2)]
[im 1/32]
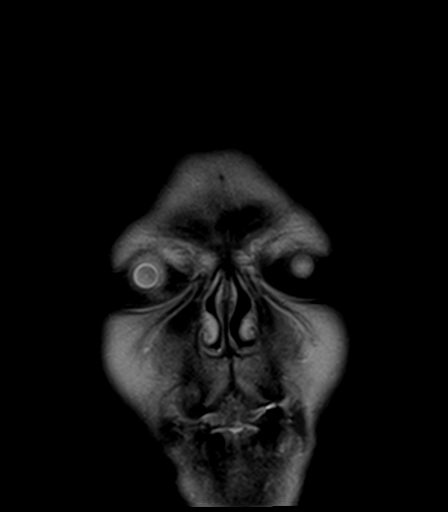
[im 16/32]
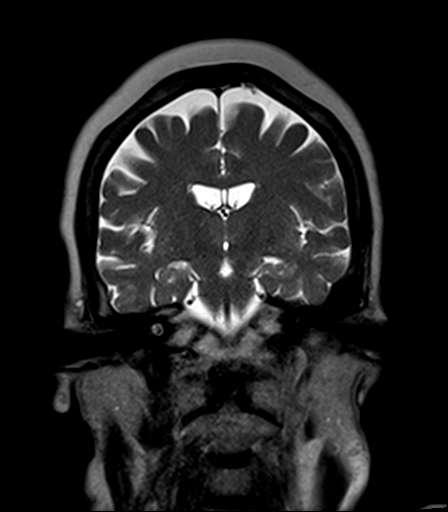
[im 32/32]
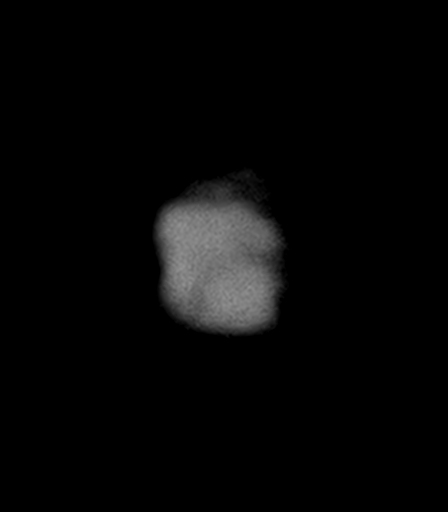

[42 of 48 positions shown; findings below may reference images not displayed]

FINDINGS: Brain: No acute infarct, mass effect or extra-axial collection. No
acute or chronic hemorrhage. Cystic encephalomalacia in the high
right parietal lobe. The midline structures are normal.

Vascular: Major flow voids are preserved.

Skull and upper cervical spine: Normal calvarium and skull base.
Visualized upper cervical spine and soft tissues are normal.

Sinuses/Orbits:No paranasal sinus fluid levels or advanced mucosal
thickening. No mastoid or middle ear effusion. Normal orbits.
IMPRESSION: 1. No acute intracranial abnormality.
2. Cystic encephalomalacia in the high right parietal lobe.

## 2020-07-03 IMAGING — MR MR CERVICAL SPINE W/O CM
5 series · 39 of 48 positions shown · non-contrast
Comparison: None.

CLINICAL DATA: Found down.  Frequent falls.

EXAM:
MRI CERVICAL SPINE WITHOUT CONTRAST
TECHNIQUE: Multiplanar, multisequence MR imaging of the cervical spine was
performed. No intravenous contrast was administered.

[Series 26: T2 · sagittal · 3.0mm · 0.62mm/px · 6 of 15 slices shown (1 of 2)]
[im 1/15]
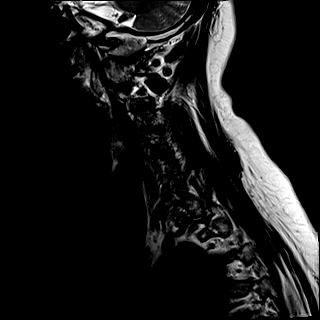
[im 3/15]
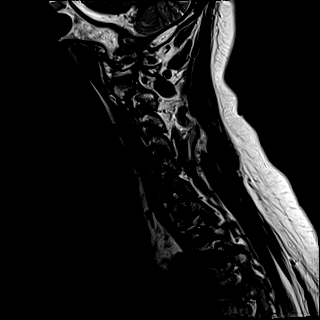
[im 6/15]
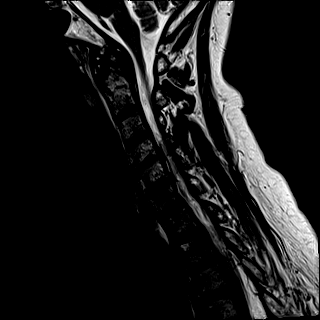
[im 9/15]
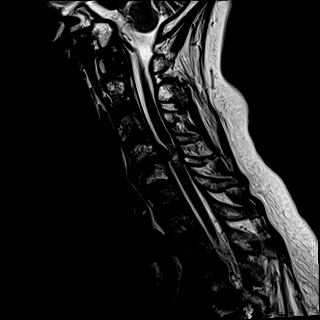
[im 12/15]
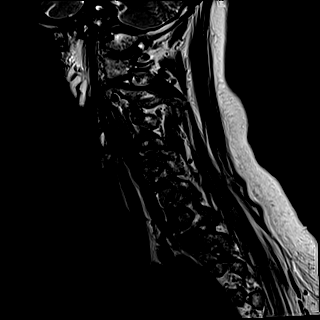
[im 15/15]
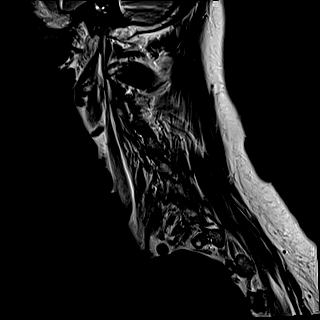

[Series 27: FLAIR · sagittal · 3.0mm · 0.78mm/px · 7 of 15 slices shown]
[im 1/15]
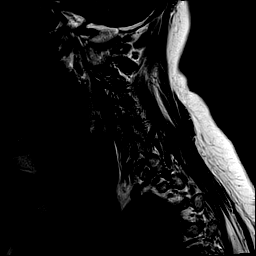
[im 3/15]
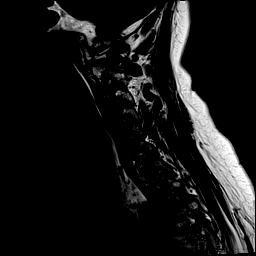
[im 5/15]
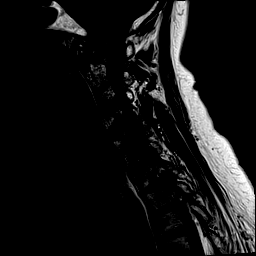
[im 8/15]
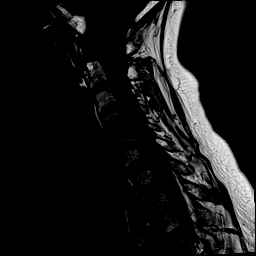
[im 10/15]
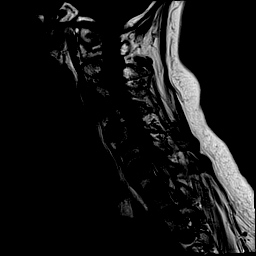
[im 12/15]
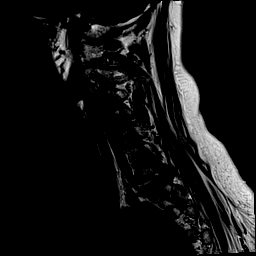
[im 15/15]
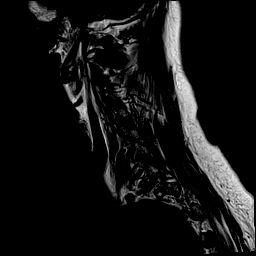

[Series 28: STIR · sagittal · 3.0mm · 0.62mm/px · 7 of 15 slices shown]
[im 1/15]
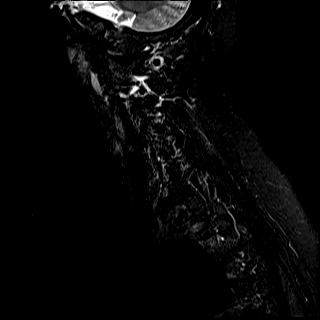
[im 3/15]
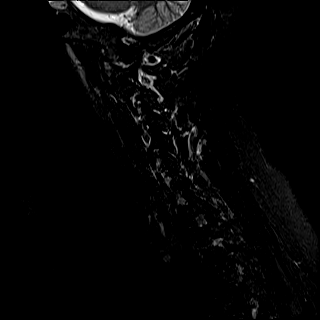
[im 5/15]
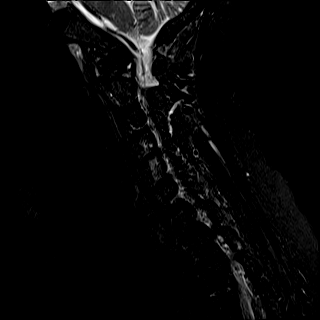
[im 8/15]
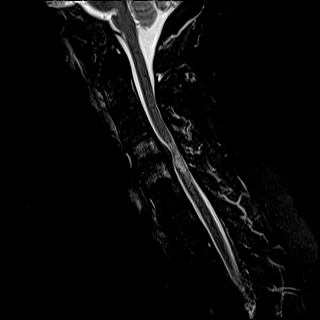
[im 10/15]
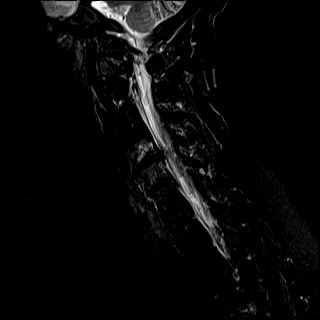
[im 12/15]
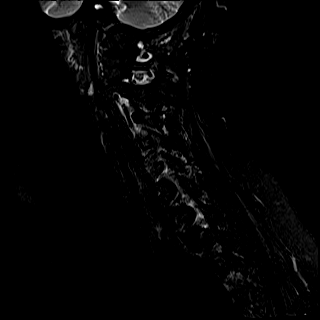
[im 15/15]
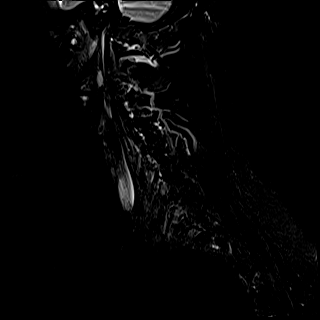

[Series 29: T2 · axial · 3.0mm · 0.70mm/px · z∈[-231,-140]mm · 11 of 30 slices shown (2 of 2)]
[im 1/30]
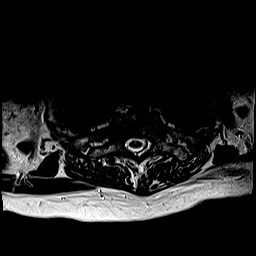
[im 3/30]
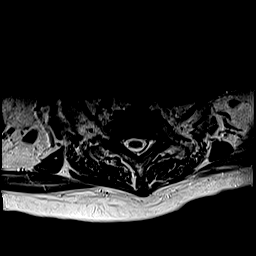
[im 5/30]
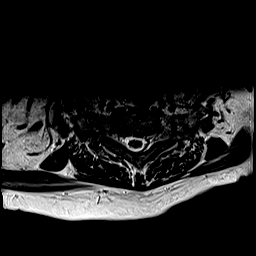
[im 7/30]
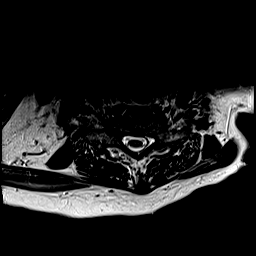
[im 9/30]
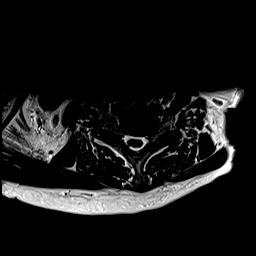
[im 12/30]
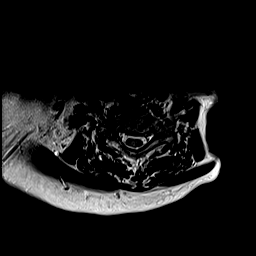
[im 14/30]
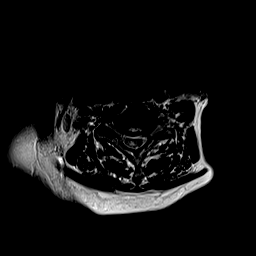
[im 16/30]
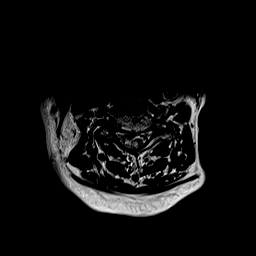
[im 21/30]
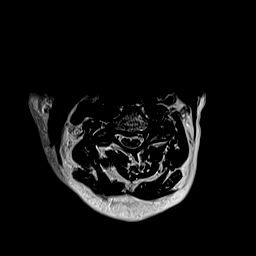
[im 25/30]
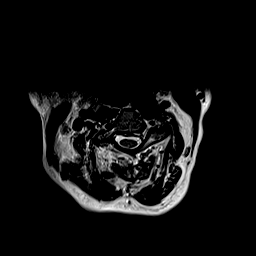
[im 30/30]
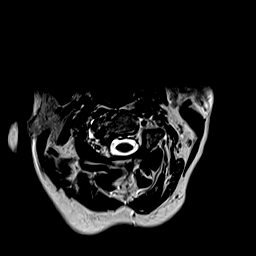

[Series 30: ax mpgr · axial · 3.0mm · 0.35mm/px · z∈[-231,-140]mm · 8 of 30 slices shown]
[im 1/30]
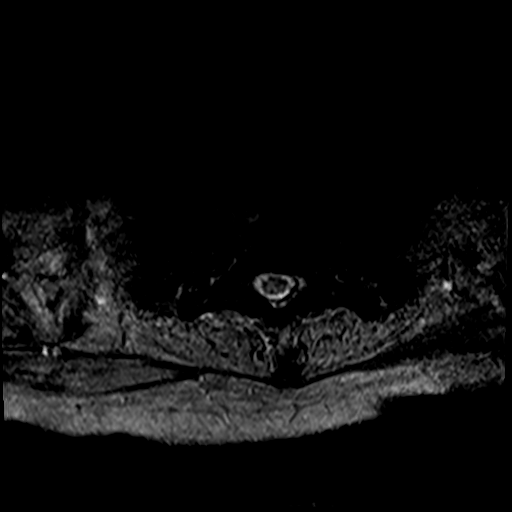
[im 5/30]
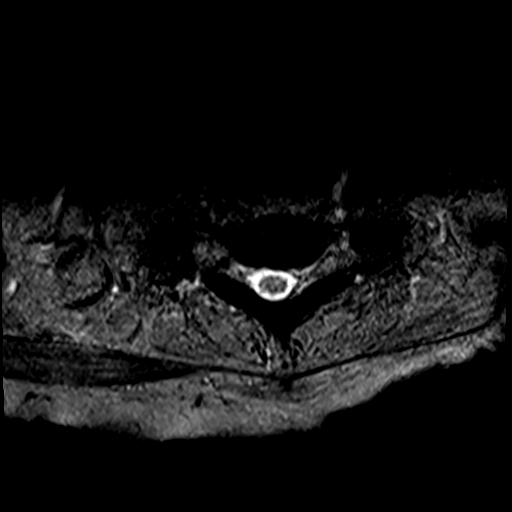
[im 9/30]
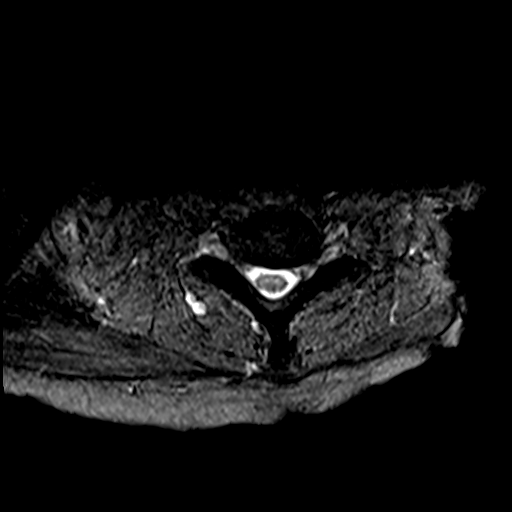
[im 14/30]
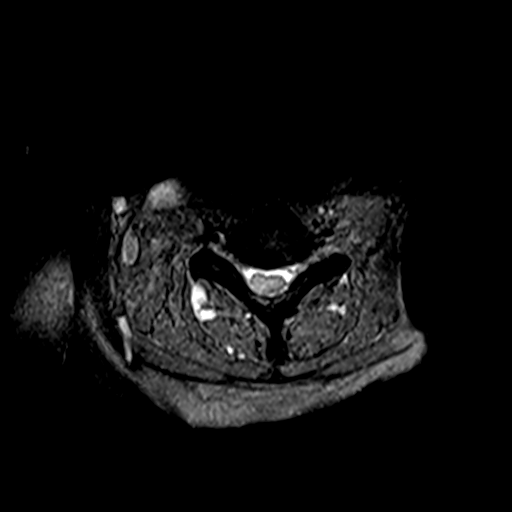
[im 16/30]
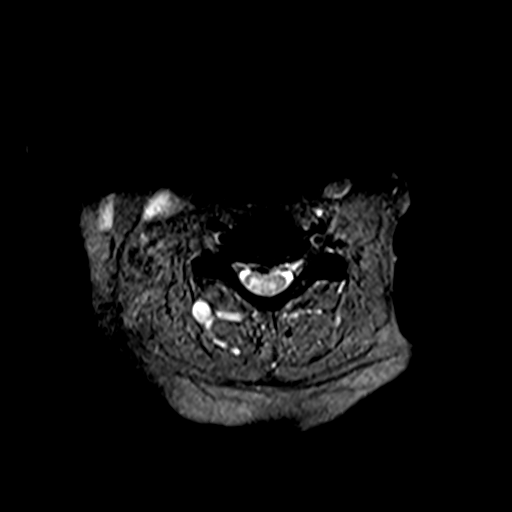
[im 21/30]
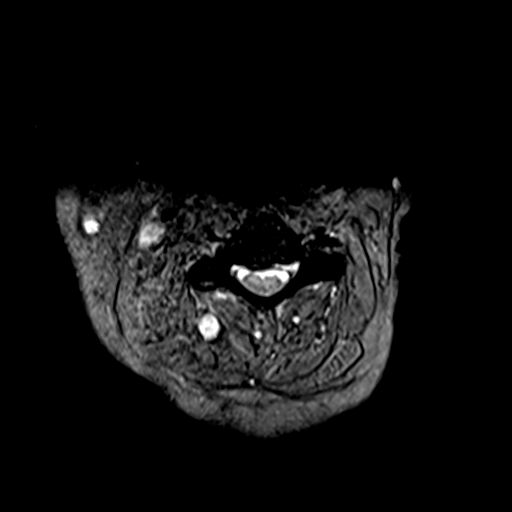
[im 25/30]
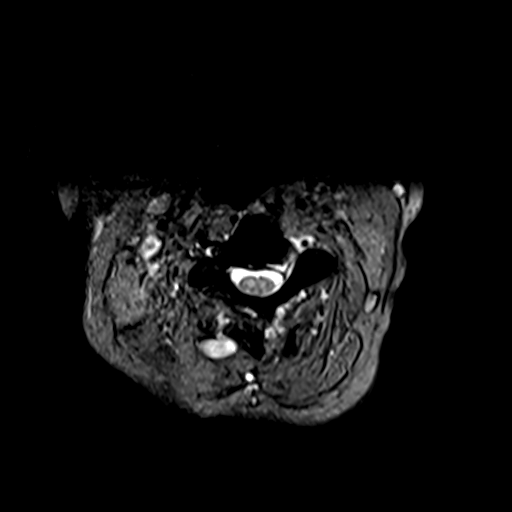
[im 30/30]
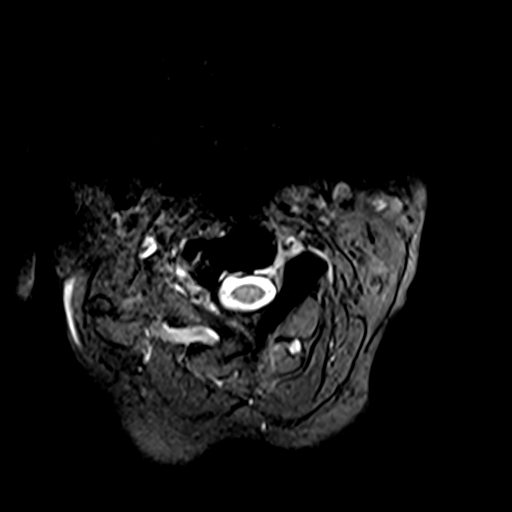

[39 of 48 positions shown; findings below may reference images not displayed]

FINDINGS: Alignment: Physiologic.  Straightening of normal cervical lordosis.

Vertebrae: Modic type 2 endplate signal changes at C4-5. No acute
fracture.

Cord: Hyperintense T2-weighted signal at the C4-5 levels within the
spinal cord.

Posterior Fossa, vertebral arteries, paraspinal tissues: Negative.

Disc levels:

C1-2: Unremarkable.

C2-3: Normal disc space and facet joints. There is no spinal canal
stenosis. No neural foraminal stenosis.

C3-4: Normal disc space and facet joints. There is no spinal canal
stenosis. No neural foraminal stenosis.

C4-5: Large central disc extrusion with components of superior and
inferior migration. There is mass effect on the spinal cord with
associated signal change. Severe spinal canal stenosis. Severe right
and mild left neural foraminal stenosis.

C5-6: Disc desiccation with mild uncovertebral hypertrophy. There is
no spinal canal stenosis. No neural foraminal stenosis.

C6-7: Normal disc space and facet joints. There is no spinal canal
stenosis. No neural foraminal stenosis.

C7-T1: Normal disc space and facet joints. There is no spinal canal
stenosis. No neural foraminal stenosis.
IMPRESSION: 1. Large central disc extrusion at C4-5 with components of superior
and inferior migration, with severe spinal canal stenosis and
compressive myelopathy.
2. Severe right C4-5 neural foraminal stenosis.

## 2020-07-03 IMAGING — DX DG KNEE 1-2V*L*
2 series · 2 of 2 positions shown · non-contrast
Comparison: [DATE]

CLINICAL DATA: Left knee pain

EXAM:
LEFT KNEE - 1-2 VIEW

[knee ap]
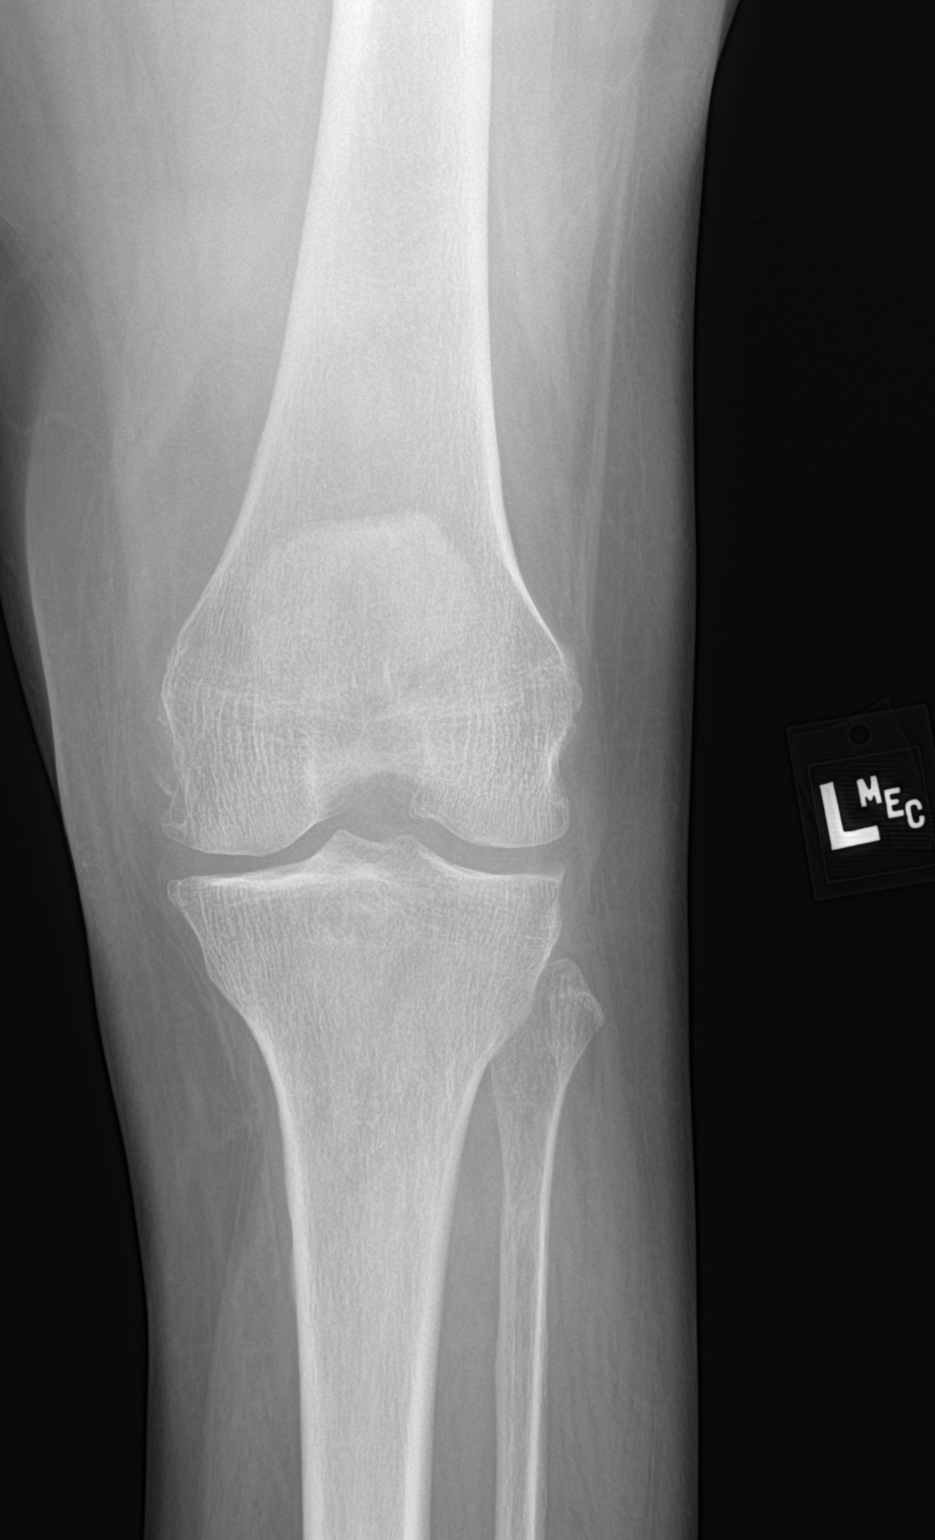

[knee lat]
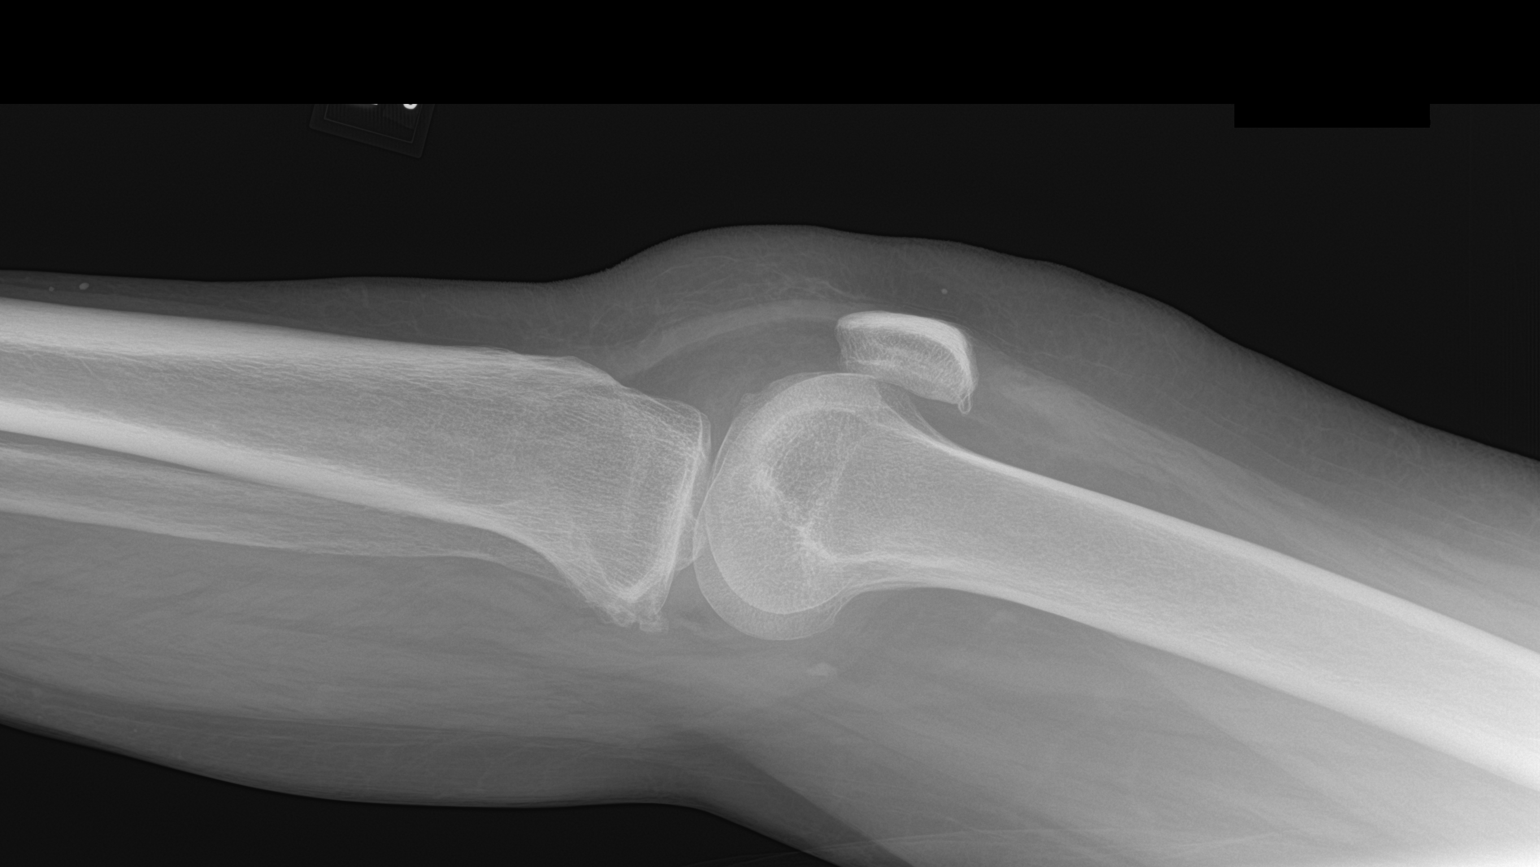

[2 of 2 positions shown; findings below may reference images not displayed]

FINDINGS: Frontal and cross-table lateral views of the left knee are obtained.
No acute fracture, subluxation, or dislocation. Stable 3
compartmental osteoarthritis greatest in the medial compartment. No
joint effusion. Soft tissues are unremarkable.
IMPRESSION: 1. Stable osteoarthritis.  No acute fracture.

## 2020-07-03 IMAGING — DX DG CHEST 1V PORT
1 series · 1 of 1 positions shown · non-contrast
Comparison: None.

CLINICAL DATA: Weakness

EXAM:
PORTABLE CHEST 1 VIEW

[chest ap]
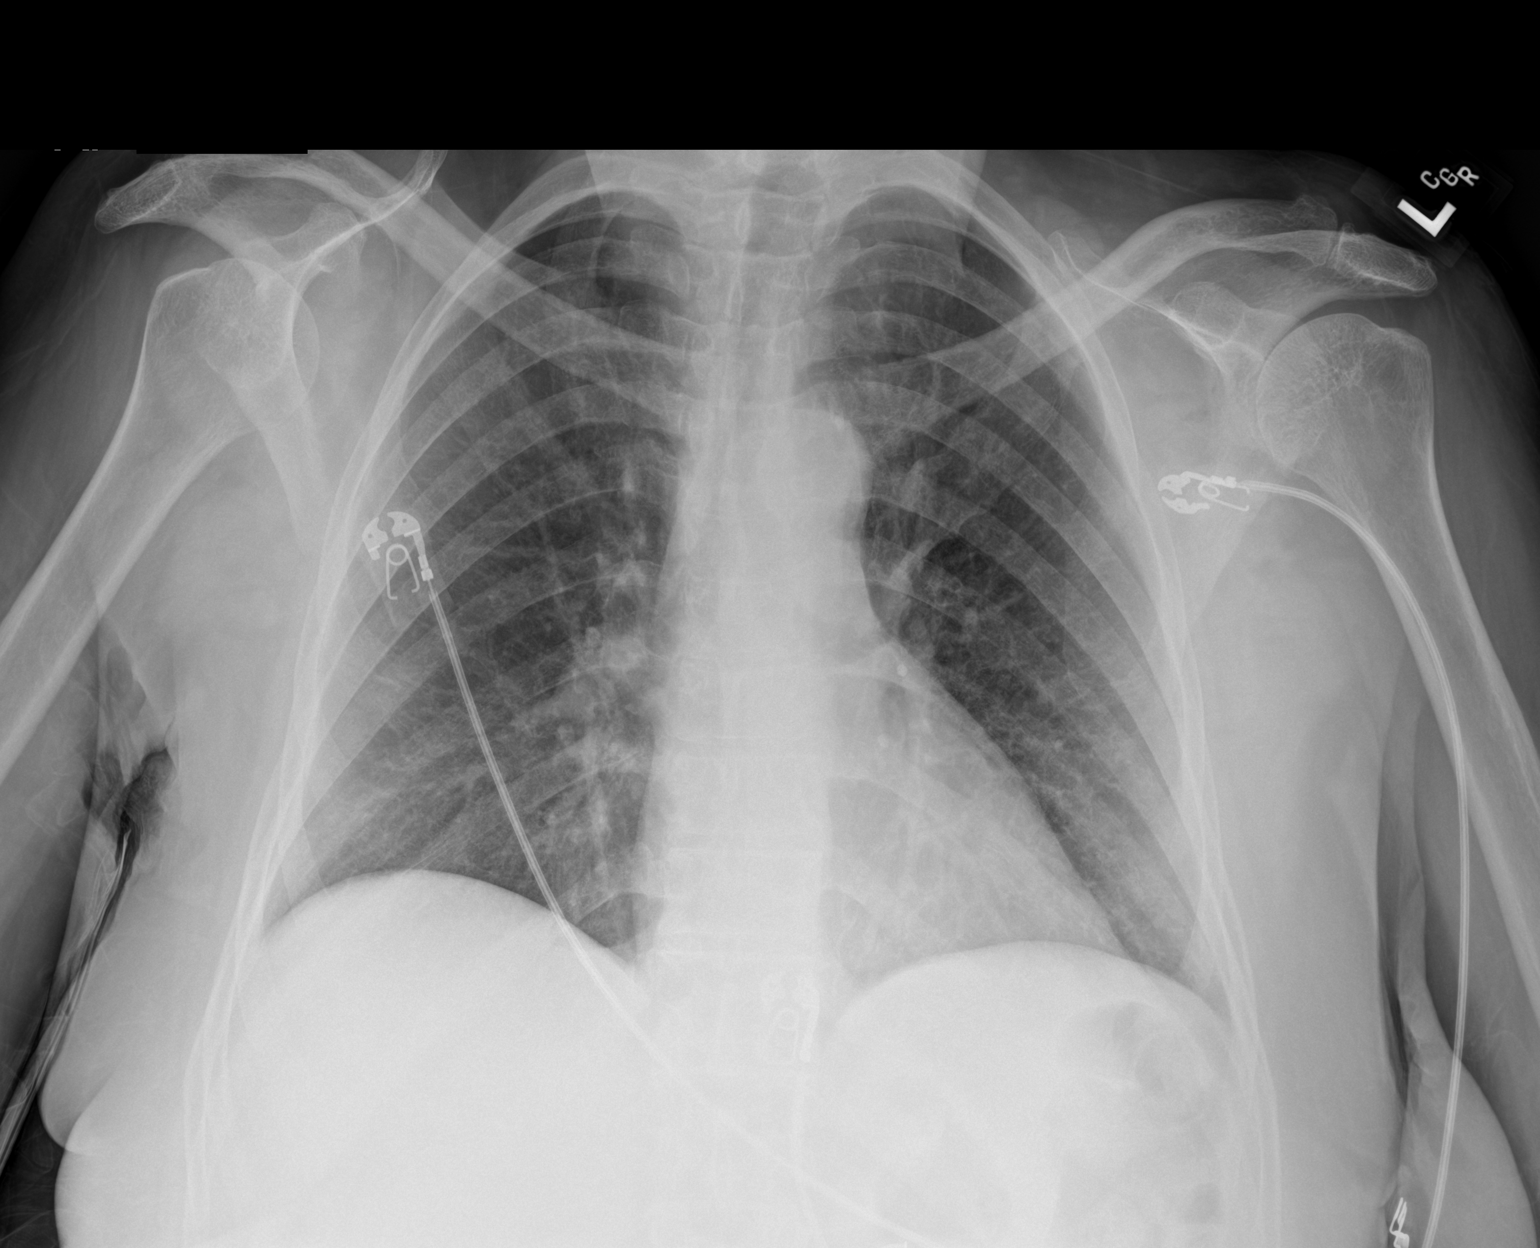

[1 of 1 positions shown; findings below may reference images not displayed]

FINDINGS: The heart size and mediastinal contours are within normal limits.
Both lungs are clear. The visualized skeletal structures are
unremarkable.
IMPRESSION: No acute abnormality of the lungs in AP portable projection.

## 2020-07-03 MED ORDER — ADULT MULTIVITAMIN W/MINERALS CH
1.0000 | ORAL_TABLET | Freq: Every day | ORAL | Status: DC
  Administered 2020-07-05 – 2020-07-11 (×7): 1 via ORAL
  Filled 2020-07-03 (×7): qty 1

## 2020-07-03 MED ORDER — ONDANSETRON HCL 4 MG PO TABS: 4.0000 mg | ORAL_TABLET | Freq: Four times a day (QID) | ORAL | Status: DC | PRN

## 2020-07-03 MED ORDER — MIDODRINE HCL 5 MG PO TABS
5.0000 mg | ORAL_TABLET | Freq: Three times a day (TID) | ORAL | Status: DC
  Administered 2020-07-04: 5 mg via ORAL
  Filled 2020-07-03 (×4): qty 1

## 2020-07-03 MED ORDER — SODIUM CHLORIDE 0.9 % IV SOLN: Freq: Once | INTRAVENOUS | Status: AC

## 2020-07-03 MED ORDER — ACETAMINOPHEN 325 MG PO TABS
650.0000 mg | ORAL_TABLET | Freq: Once | ORAL | Status: AC
  Administered 2020-07-03: 650 mg via ORAL
  Filled 2020-07-03: qty 2

## 2020-07-03 MED ORDER — LACTATED RINGERS IV SOLN: INTRAVENOUS | Status: AC

## 2020-07-03 MED ORDER — ENOXAPARIN SODIUM 60 MG/0.6ML ~~LOC~~ SOLN
0.5000 mg/kg | SUBCUTANEOUS | Status: DC
  Administered 2020-07-04: 52.5 mg via SUBCUTANEOUS
  Filled 2020-07-03: qty 0.6

## 2020-07-03 MED ORDER — ACETAMINOPHEN 650 MG RE SUPP: 650.0000 mg | Freq: Four times a day (QID) | RECTAL | Status: DC | PRN

## 2020-07-03 MED ORDER — LORAZEPAM 1 MG PO TABS: 1.0000 mg | ORAL_TABLET | ORAL | Status: DC | PRN

## 2020-07-03 MED ORDER — MIDODRINE HCL 5 MG PO TABS
2.5000 mg | ORAL_TABLET | Freq: Once | ORAL | Status: DC
  Filled 2020-07-03: qty 0.5

## 2020-07-03 MED ORDER — THIAMINE HCL 100 MG/ML IJ SOLN
100.0000 mg | Freq: Every day | INTRAMUSCULAR | Status: DC
  Administered 2020-07-04: 100 mg via INTRAVENOUS
  Filled 2020-07-03 (×2): qty 2

## 2020-07-03 MED ORDER — SODIUM CHLORIDE 0.9 % IV BOLUS
500.0000 mL | Freq: Once | INTRAVENOUS | Status: AC
  Administered 2020-07-03: 500 mL via INTRAVENOUS

## 2020-07-03 MED ORDER — LORAZEPAM 2 MG PO TABS: 0.0000 mg | ORAL_TABLET | Freq: Two times a day (BID) | ORAL | Status: DC

## 2020-07-03 MED ORDER — POLYETHYLENE GLYCOL 3350 17 G PO PACK
17.0000 g | PACK | Freq: Every day | ORAL | Status: DC | PRN
  Administered 2020-07-04: 03:00:00 17 g via ORAL
  Filled 2020-07-03: qty 1

## 2020-07-03 MED ORDER — LORAZEPAM 2 MG PO TABS: 0.0000 mg | ORAL_TABLET | Freq: Four times a day (QID) | ORAL | Status: DC

## 2020-07-03 MED ORDER — ACETAMINOPHEN 325 MG PO TABS
650.0000 mg | ORAL_TABLET | Freq: Four times a day (QID) | ORAL | Status: DC | PRN
  Administered 2020-07-04 – 2020-07-11 (×12): 650 mg via ORAL
  Filled 2020-07-03 (×12): qty 2

## 2020-07-03 MED ORDER — LORAZEPAM 2 MG/ML IJ SOLN: 1.0000 mg | INTRAMUSCULAR | Status: DC | PRN

## 2020-07-03 MED ORDER — FOLIC ACID 1 MG PO TABS
1.0000 mg | ORAL_TABLET | Freq: Every day | ORAL | Status: DC
  Administered 2020-07-05 – 2020-07-11 (×7): 1 mg via ORAL
  Filled 2020-07-03 (×7): qty 1

## 2020-07-03 MED ORDER — SODIUM CHLORIDE 0.9 % IV BOLUS
250.0000 mL | Freq: Once | INTRAVENOUS | Status: AC
  Administered 2020-07-03: 250 mL via INTRAVENOUS

## 2020-07-03 MED ORDER — ONDANSETRON HCL 4 MG/2ML IJ SOLN: 4.0000 mg | Freq: Four times a day (QID) | INTRAMUSCULAR | Status: DC | PRN

## 2020-07-03 MED ORDER — THIAMINE HCL 100 MG PO TABS
100.0000 mg | ORAL_TABLET | Freq: Every day | ORAL | Status: DC
  Administered 2020-07-05 – 2020-07-11 (×7): 100 mg via ORAL
  Filled 2020-07-03 (×8): qty 1

## 2020-07-03 NOTE — ED Triage Notes (Signed)
Pt to ED from home via [facility], c/o increasing bilateral foot and leg pain over last 1.5 months Cannot walk well for past month Was seen at [facility] stroke symptoms 2 months ago, was pinched nerve. 10/10 pain, pt took 500mg  tylenol this morning at home Allergic to PCN and codeine Feet cold to touch, can wiggle toes per EMS [facility] yesterday, on floor for 1 hour. Fell 2 months ago due to bilateral leg weakness. Denies hitting head. Pt is A&O times 4.  This nurse noted +1 bilateral pedal pulses.  Pt denies pain at this time, states knees hurt when moves legs.

## 2020-07-03 NOTE — Consult Note (Signed)
Neurosurgery-New Consultation Evaluation 07/03/2020 Abrea Henle [id number]  Identifying Statement: Jaeleah Smyser is a 60 y.o. female from [place] [address] with weakness  Physician Requesting Consultation: [facility]  History of Present Illness: Ms. Langland is here for evaluation of ongoing weakness.  She states that Christmas Day of last year she did have a fall at home resulting in weakness for which she was treated at [facility].  She was treated for a few days in the hospital there.  She was discharged home with ongoing weakness and required care from her family member.  She was attempting to set up a clinic visit for discussion of surgery but unfortunately noticed even more decline a few weeks ago.  She was using a walker but felt unable to use that at this time.  She is been unable to get up and go to the bathroom.  She did have another fall in the last day that she says resulted in more weakness.  She is now here for evaluation.  She does note some numbness in multiple extremities.  Past Medical History:  History reviewed. No pertinent past medical history.  Social History: Social History   Socioeconomic History  . Marital status: Single    Spouse name: Not on file  . Number of children: Not on file  . Years of education: Not on file  . Highest education level: Not on file  Occupational History  . Not on file  Tobacco Use  . Smoking status: Current Every Day Smoker  . Smokeless tobacco: Never Used  Substance and Sexual Activity  . Alcohol use: Yes    Comment: occassionally  . Drug use: Not Currently  . Sexual activity: Not on file  Other Topics Concern  . Not on file  Social History Narrative  . Not on file   Social Determinants of Health   Financial Resource Strain: Not on file  Food Insecurity: Not on file  Transportation Needs: Not on file  Physical Activity: Not on file  Stress: Not on file  Social Connections: Not on file   Intimate Partner Violence: Not on file    Family History: History reviewed. No pertinent family history.  Review of Systems:  Review of Systems - General ROS: Negative Psychological ROS: Negative Ophthalmic ROS: Negative ENT ROS: Negative Hematological and Lymphatic ROS: Negative  Endocrine ROS: Negative Respiratory ROS: Negative Cardiovascular ROS: Negative Gastrointestinal ROS: Negative Genito-Urinary ROS: Positive for incontinence Musculoskeletal ROS: Negative Neurological ROS: Positive for numbness and weakness Dermatological ROS: Negative  Physical Exam: BP (!) 117/52   Pulse 92   Temp 98.1 F (36.7 C) (Oral)   Resp 17   Ht 5\' 7"  (1.702 m)   Wt 104.3 kg   SpO2 96%   BMI 36.02 kg/m  Body mass index is 36.02 kg/m. Body surface area is 2.22 meters squared. General appearance: Alert, cooperative, in no acute distress Head: Normocephalic, atraumatic Eyes: Normal, EOM intact Oropharynx: Appears hydrated Neck: Supple, no obvious tenderness Ext: No edema in LE bilaterally but she does have tenderness around the left knee  Neurologic exam:  Mental status: alertness: alert, affect: normal Speech: fluent and clear Motor:strength strength appears 5 out of 5 in bilateral deltoids, biceps, triceps.  She is 1 out of 5 in bilateral grip and wrist extension.  She is 4 out of 5 in bilateral hip flexion but does note pain in the left knee limiting this.  She has 5-5 in bilateral dorsi and plantar flexion but does not  show any appreciable movement at knee extension Sensory: Decreased to light touch in bilateral lower extremities throughout all dermatomes and in bilateral hands below the forearm Gait: Not tested  Laboratory: Results for orders placed or performed during the hospital encounter of 07/03/20  CBC with Differential  Result Value Ref Range   WBC 9.3 4.0 - 10.5 K/uL   RBC 4.36 3.87 - 5.11 MIL/uL   Hemoglobin 15.5 (H) 12.0 - 15.0 g/dL   HCT 42.8 (H) 76.8 - 11.5 %    MCV 106.7 (H) 80.0 - 100.0 fL   MCH 35.6 (H) 26.0 - 34.0 pg   MCHC 33.3 30.0 - 36.0 g/dL   RDW 72.6 20.3 - 55.9 %   Platelets 287 150 - 400 K/uL   nRBC 0.0 0.0 - 0.2 %   Neutrophils Relative % 66 %   Neutro Abs 6.1 1.7 - 7.7 K/uL   Lymphocytes Relative 23 %   Lymphs Abs 2.1 0.7 - 4.0 K/uL   Monocytes Relative 9 %   Monocytes Absolute 0.9 0.1 - 1.0 K/uL   Eosinophils Relative 1 %   Eosinophils Absolute 0.1 0.0 - 0.5 K/uL   Basophils Relative 1 %   Basophils Absolute 0.1 0.0 - 0.1 K/uL   Immature Granulocytes 0 %   Abs Immature Granulocytes 0.03 0.00 - 0.07 K/uL  Basic metabolic panel  Result Value Ref Range   Sodium 136 135 - 145 mmol/L   Potassium 4.1 3.5 - 5.1 mmol/L   Chloride 95 (L) 98 - 111 mmol/L   CO2 24 22 - 32 mmol/L   Glucose, Bld 75 70 - 99 mg/dL   BUN 13 8 - 23 mg/dL   Creatinine, Ser 7.41 (H) 0.44 - 1.00 mg/dL   Calcium 9.9 8.9 - 63.8 mg/dL   GFR, Estimated >45 >36 mL/min   Anion gap 17 (H) 5 - 15  CK  Result Value Ref Range   Total CK 375 (H) 38 - 234 U/L   I personally reviewed labs  Imaging: No current imaging  Impression/Plan:   Ms. Collard is here for evaluation of ongoing symptoms of weakness stemming from a an initial diagnosis of central cord syndrome in December.  I do not have those images for review and therefore I do recommend MRI of the cervical spine for full evaluation.  Given the degree of her weakness and the chronicity, I do not think an emergent intervention is needed but I do recommend management of her blood pressure to ensure no hypotension.  This would be to encourage adequate spinal cord perfusion.  Once we have the images, we can make a determination on surgical options as she may need a decompression in the near future.  Given the left knee pain, I do recommend x-ray of the left knee  1.  Diagnosis: Ongoing weakness, prior central cord syndrome  2.  Plan -MRI cervical spine recommended -Recommend try to maintain maps greater than  80 mmHg

## 2020-07-03 NOTE — ED Notes (Signed)
Pt sister arrived and pt given something to drink. Pt still waiting for MRI.

## 2020-07-03 NOTE — ED Notes (Signed)
Pt on phone with MRI

## 2020-07-03 NOTE — ED Notes (Signed)
Bladder scan reveals 82 mL. Repeated 4 times to verify.

## 2020-07-03 NOTE — ED Provider Notes (Signed)
  Patient received in signout from Dr. Roxan Hockey pending MRI brain and C-spine for evaluation of worsening weakness in the setting of known central cord syndrome.  Already evaluated by neurosurgery, recommending BP support to maintain maps greater than 80.  MRIs reviewed with C4/5 disc extrusion and cord compression.  I discussed the case with Dr. Adriana Simas again, he reports no immediate intervention indicated.  Continues recommend BP support, PT OT and that he may be able to perform surgery on Friday.  Discussed case with hospitalist, who agrees to admit the patient.  Clinical Course as of 07/03/20 2245  Tue Jul 03, 2020  2216 Discussed the case with Dr. Adriana Simas, recommends continue blood pressure support maintaining maps greater than 80, admission for PT/OT, and he may be able to perform surgery on Friday [DS]  2232 Spoke with Dr. Leafy Half, who agrees to admit the patient [DS]    Clinical Course User Index [DS] Delton Prairie, MD      Delton Prairie, MD 07/03/20 [id number]

## 2020-07-03 NOTE — ED Notes (Signed)
Patient transported to MRI 

## 2020-07-03 NOTE — ED Notes (Signed)
Messaged Tamikia H. RN for transport to the floor.

## 2020-07-03 NOTE — ED Provider Notes (Signed)
[facility] Provider Note    Event Date/Time   First MD Initiated Contact with Patient 07/03/20 1231     (approximate)  I have reviewed the triage vital signs and the nursing notes.   HISTORY  Chief Complaint Extremity Weakness    HPI [facility] is a 60 y.o. female presents to the ER via EMS after DSS can evaluate patient for on the floor.  Is having frequent falls not able to care for her himself as she lives at home alone.  Patient was admitted to [facility] in early January found to have Covid as well as MRI suggesting central cord syndrome.  Was evaluate by neurosurgery and discharged home for outpatient follow-up.  Interim she is not been able to follow-up with neurosurgery.  She has had increasing weakness and frequent falls.  Denies any headache.  Denies any fevers.  No chest pain or shortness of breath.  She is unable to care for herself at home    History reviewed. No pertinent past medical history. History reviewed. No pertinent family history. History reviewed. No pertinent surgical history. There are no problems to display for this patient.     Prior to Admission medications   Not on File    Allergies Codeine and Penicillins    Social History Social History   Tobacco Use  . Smoking status: Current Every Day Smoker  . Smokeless tobacco: Never Used  Substance Use Topics  . Alcohol use: Yes    Comment: occassionally  . Drug use: Not Currently    Review of Systems Patient denies headaches, rhinorrhea, blurry vision, numbness, shortness of breath, chest pain, edema, cough, abdominal pain, nausea, vomiting, diarrhea, dysuria, fevers, rashes or hallucinations unless otherwise stated above in HPI. ____________________________________________   PHYSICAL EXAM:  VITAL SIGNS: Vitals:   07/03/20 1530 07/03/20 1630  BP: 120/62 (!) 117/52  Pulse: 91 92  Resp: 16 17  Temp:    SpO2: 97% 96%    Constitutional:  Alert and oriented.  Eyes: Conjunctivae are normal.  Head: Atraumatic. Nose: No congestion/rhinnorhea. Mouth/Throat: Mucous membranes are moist.   Neck: No stridor. Painless ROM.  Cardiovascular: Normal rate, regular rhythm. Grossly normal heart sounds.  Good peripheral circulation. Respiratory: Normal respiratory effort.  No retractions. Lungs CTAB. Gastrointestinal: Soft and nontender. No distention. No abdominal bruits. No CVA tenderness. Genitourinary:  Musculoskeletal: No lower extremity tenderness nor edema.  No joint effusions. Neurologic:  Normal speech and language. Weakness to grip and extension to bilat UE,  4/5 weakness to bilat LE Skin:  Skin is warm, dry and intact. No rash noted. Psychiatric: Mood and affect are normal. Speech and behavior are normal.  ____________________________________________   LABS (all labs ordered are listed, but only abnormal results are displayed)  Results for orders placed or performed during the hospital encounter of 07/03/20 (from the past 24 hour(s))  CBC with Differential     Status: Abnormal   Collection Time: 07/03/20 12:25 PM  Result Value Ref Range   WBC 9.3 4.0 - 10.5 K/uL   RBC 4.36 3.87 - 5.11 MIL/uL   Hemoglobin 15.5 (H) 12.0 - 15.0 g/dL   HCT 46.5 (H) 68.1 - 27.5 %   MCV 106.7 (H) 80.0 - 100.0 fL   MCH 35.6 (H) 26.0 - 34.0 pg   MCHC 33.3 30.0 - 36.0 g/dL   RDW 17.0 01.7 - 49.4 %   Platelets 287 150 - 400 K/uL   nRBC 0.0 0.0 - 0.2 %  Neutrophils Relative % 66 %   Neutro Abs 6.1 1.7 - 7.7 K/uL   Lymphocytes Relative 23 %   Lymphs Abs 2.1 0.7 - 4.0 K/uL   Monocytes Relative 9 %   Monocytes Absolute 0.9 0.1 - 1.0 K/uL   Eosinophils Relative 1 %   Eosinophils Absolute 0.1 0.0 - 0.5 K/uL   Basophils Relative 1 %   Basophils Absolute 0.1 0.0 - 0.1 K/uL   Immature Granulocytes 0 %   Abs Immature Granulocytes 0.03 0.00 - 0.07 K/uL  Basic metabolic panel     Status: Abnormal   Collection Time: 07/03/20 12:25 PM  Result  Value Ref Range   Sodium 136 135 - 145 mmol/L   Potassium 4.1 3.5 - 5.1 mmol/L   Chloride 95 (L) 98 - 111 mmol/L   CO2 24 22 - 32 mmol/L   Glucose, Bld 75 70 - 99 mg/dL   BUN 13 8 - 23 mg/dL   Creatinine, Ser 4.76 (H) 0.44 - 1.00 mg/dL   Calcium 9.9 8.9 - 54.6 mg/dL   GFR, Estimated >50 >35 mL/min   Anion gap 17 (H) 5 - 15  CK     Status: Abnormal   Collection Time: 07/03/20  1:47 PM  Result Value Ref Range   Total CK 375 (H) 38 - 234 U/L  Urinalysis, Complete w Microscopic     Status: Abnormal   Collection Time: 07/03/20  4:49 PM  Result Value Ref Range   Color, Urine AMBER (A) YELLOW   APPearance HAZY (A) CLEAR   Specific Gravity, Urine 1.028 1.005 - 1.030   pH 5.0 5.0 - 8.0   Glucose, UA NEGATIVE NEGATIVE mg/dL   Hgb urine dipstick MODERATE (A) NEGATIVE   Bilirubin Urine SMALL (A) NEGATIVE   Ketones, ur 80 (A) NEGATIVE mg/dL   Protein, ur NEGATIVE NEGATIVE mg/dL   Nitrite NEGATIVE NEGATIVE   Leukocytes,Ua MODERATE (A) NEGATIVE   RBC / HPF 0-5 0 - 5 RBC/hpf   WBC, UA 6-10 0 - 5 WBC/hpf   Bacteria, UA RARE (A) NONE SEEN   Squamous Epithelial / LPF 0-5 0 - 5   Hyaline Casts, UA PRESENT    ____________________________________________ ____________________________________________  RADIOLOGY  I personally reviewed all radiographic images ordered to evaluate for the above acute complaints and reviewed radiology reports and findings.  These findings were personally discussed with the patient.  Please see medical record for radiology report.  ____________________________________________   PROCEDURES  Procedure(s) performed:  Procedures    Critical Care performed: no ____________________________________________   INITIAL IMPRESSION / ASSESSMENT AND PLAN / ED COURSE  Pertinent labs & imaging results that were available during my care of the patient were reviewed by me and considered in my medical decision making (see chart for details).   DDX: Electrolyte  abnormality, rhabdo, dehydration, UTI, sepsis, stroke, central cord syndrome, spinal stenosis  [facility] is a 60 y.o. who presents to the ED with presentation as described above.  Symptoms have been ongoing for several weeks and seems that she is developing worsening symptoms of central cord syndrome.  Has had increased falls.  Brought in by DSS that she is unable to care for herself at home.  Bladder does show mild elevation of CK possible component of rhabdo will give IV fluids.  MRI ordered of the brain and cervical spine for the but differential.  I discussed case in consultation with Dr. Adriana Simas who recommends of MRI without any acute changes bleed or findings to suggest  need for emergent surgery would be appropriate for admission to the hospital here.  Patient be signed out to oncoming physician pending MRI     The patient was evaluated in Emergency Department today for the symptoms described in the history of present illness. He/she was evaluated in the context of the global COVID-19 pandemic, which necessitated consideration that the patient might be at risk for infection with the SARS-CoV-2 virus that causes COVID-19. Institutional protocols and algorithms that pertain to the evaluation of patients at risk for COVID-19 are in a state of rapid change based on information released by regulatory bodies including the CDC and federal and state organizations. These policies and algorithms were followed during the patient's care in the ED.  As part of my medical decision making, I reviewed the following data within the electronic MEDICAL RECORD NUMBER Nursing notes reviewed and incorporated, Labs reviewed, notes from prior ED visits and [place] Controlled Substance Database   ____________________________________________   FINAL CLINICAL IMPRESSION(S) / ED DIAGNOSES  Final diagnoses:  Weakness      NEW MEDICATIONS STARTED DURING THIS VISIT:  New Prescriptions   No medications on file     Note:   This document was prepared using Dragon voice recognition software and may include unintentional dictation errors.    Willy Eddy, MD 07/03/20 [phone]

## 2020-07-03 NOTE — ED Notes (Signed)
[place] DSS social worker came to see pt and would like [facility] CSW to call her when pt is seen. Contact number is [phone]. Name is [employer].

## 2020-07-04 ENCOUNTER — Inpatient Hospital Stay: Payer: Medicaid Other | Admitting: Anesthesiology

## 2020-07-04 ENCOUNTER — Encounter: Admission: EM | Payer: Self-pay | Source: Home / Self Care | Attending: Internal Medicine

## 2020-07-04 ENCOUNTER — Other Ambulatory Visit: Payer: Self-pay

## 2020-07-04 ENCOUNTER — Inpatient Hospital Stay: Payer: Medicaid Other

## 2020-07-04 DIAGNOSIS — E876 Hypokalemia: Secondary | ICD-10-CM | POA: Diagnosis not present

## 2020-07-04 DIAGNOSIS — F17210 Nicotine dependence, cigarettes, uncomplicated: Secondary | ICD-10-CM | POA: Diagnosis present

## 2020-07-04 HISTORY — PX: ANTERIOR CERVICAL DECOMP/DISCECTOMY FUSION: SHX1161

## 2020-07-04 LAB — CBC WITH DIFFERENTIAL/PLATELET
Abs Immature Granulocytes: 0.04 10*3/uL (ref 0.00–0.07)
Basophils Absolute: 0.1 10*3/uL (ref 0.0–0.1)
Basophils Relative: 1 %
Eosinophils Absolute: 0.1 10*3/uL (ref 0.0–0.5)
Eosinophils Relative: 2 %
HCT: 39.7 % (ref 36.0–46.0)
Hemoglobin: 13.4 g/dL (ref 12.0–15.0)
Immature Granulocytes: 1 %
Lymphocytes Relative: 26 %
Lymphs Abs: 2 10*3/uL (ref 0.7–4.0)
MCH: 35.9 pg — ABNORMAL HIGH (ref 26.0–34.0)
MCHC: 33.8 g/dL (ref 30.0–36.0)
MCV: 106.4 fL — ABNORMAL HIGH (ref 80.0–100.0)
Monocytes Absolute: 0.7 10*3/uL (ref 0.1–1.0)
Monocytes Relative: 9 %
Neutro Abs: 4.6 10*3/uL (ref 1.7–7.7)
Neutrophils Relative %: 61 %
Platelets: 263 10*3/uL (ref 150–400)
RBC: 3.73 MIL/uL — ABNORMAL LOW (ref 3.87–5.11)
RDW: 12.8 % (ref 11.5–15.5)
WBC: 7.5 10*3/uL (ref 4.0–10.5)
nRBC: 0 % (ref 0.0–0.2)

## 2020-07-04 LAB — PROTIME-INR
INR: 1 (ref 0.8–1.2)
Prothrombin Time: 13.1 seconds (ref 11.4–15.2)

## 2020-07-04 LAB — COMPREHENSIVE METABOLIC PANEL
ALT: 13 U/L (ref 0–44)
AST: 22 U/L (ref 15–41)
Albumin: 3 g/dL — ABNORMAL LOW (ref 3.5–5.0)
Alkaline Phosphatase: 53 U/L (ref 38–126)
Anion gap: 11 (ref 5–15)
BUN: 11 mg/dL (ref 8–23)
CO2: 26 mmol/L (ref 22–32)
Calcium: 9.2 mg/dL (ref 8.9–10.3)
Chloride: 100 mmol/L (ref 98–111)
Creatinine, Ser: 0.83 mg/dL (ref 0.44–1.00)
GFR, Estimated: >60 mL/min (ref >60)
Glucose, Bld: 83 mg/dL (ref 70–99)
Potassium: 3.4 mmol/L — ABNORMAL LOW (ref 3.5–5.1)
Sodium: 137 mmol/L (ref 135–145)
Total Bilirubin: 1.3 mg/dL — ABNORMAL HIGH (ref 0.3–1.2)
Total Protein: 6.1 g/dL — ABNORMAL LOW (ref 6.5–8.1)

## 2020-07-04 LAB — CK: Total CK: 279 U/L — ABNORMAL HIGH (ref 38–234)

## 2020-07-04 LAB — URINE DRUG SCREEN, QUALITATIVE (ARMC ONLY)
Amphetamines, Ur Screen: NOT DETECTED
Barbiturates, Ur Screen: NOT DETECTED
Benzodiazepine, Ur Scrn: NOT DETECTED
Cannabinoid 50 Ng, Ur ~~LOC~~: POSITIVE — AB
Cocaine Metabolite,Ur ~~LOC~~: POSITIVE — AB
MDMA (Ecstasy)Ur Screen: NOT DETECTED
Methadone Scn, Ur: NOT DETECTED
Opiate, Ur Screen: NOT DETECTED
Phencyclidine (PCP) Ur S: NOT DETECTED
Tricyclic, Ur Screen: NOT DETECTED

## 2020-07-04 LAB — SARS CORONAVIRUS 2 (TAT 6-24 HRS): SARS Coronavirus 2: NEGATIVE

## 2020-07-04 LAB — HIV ANTIBODY (ROUTINE TESTING W REFLEX): HIV Screen 4th Generation wRfx: NONREACTIVE

## 2020-07-04 LAB — MAGNESIUM: Magnesium: 1.5 mg/dL — ABNORMAL LOW (ref 1.7–2.4)

## 2020-07-04 IMAGING — RF DG CERVICAL SPINE 2 OR 3 VIEWS
1 series · 3 of 3 positions shown · non-contrast
Comparison: Cervical spine MRI [DATE].

CLINICAL DATA: Surgery, elective. Provided fluoroscopy time: 3
seconds.

EXAM:
CERVICAL SPINE - 2-3 VIEW; DG C-ARM 1-60 MIN

[Series 1: dg x-ray · 0.20mm/px · 3 of 3 slices shown]
[im 1/3]
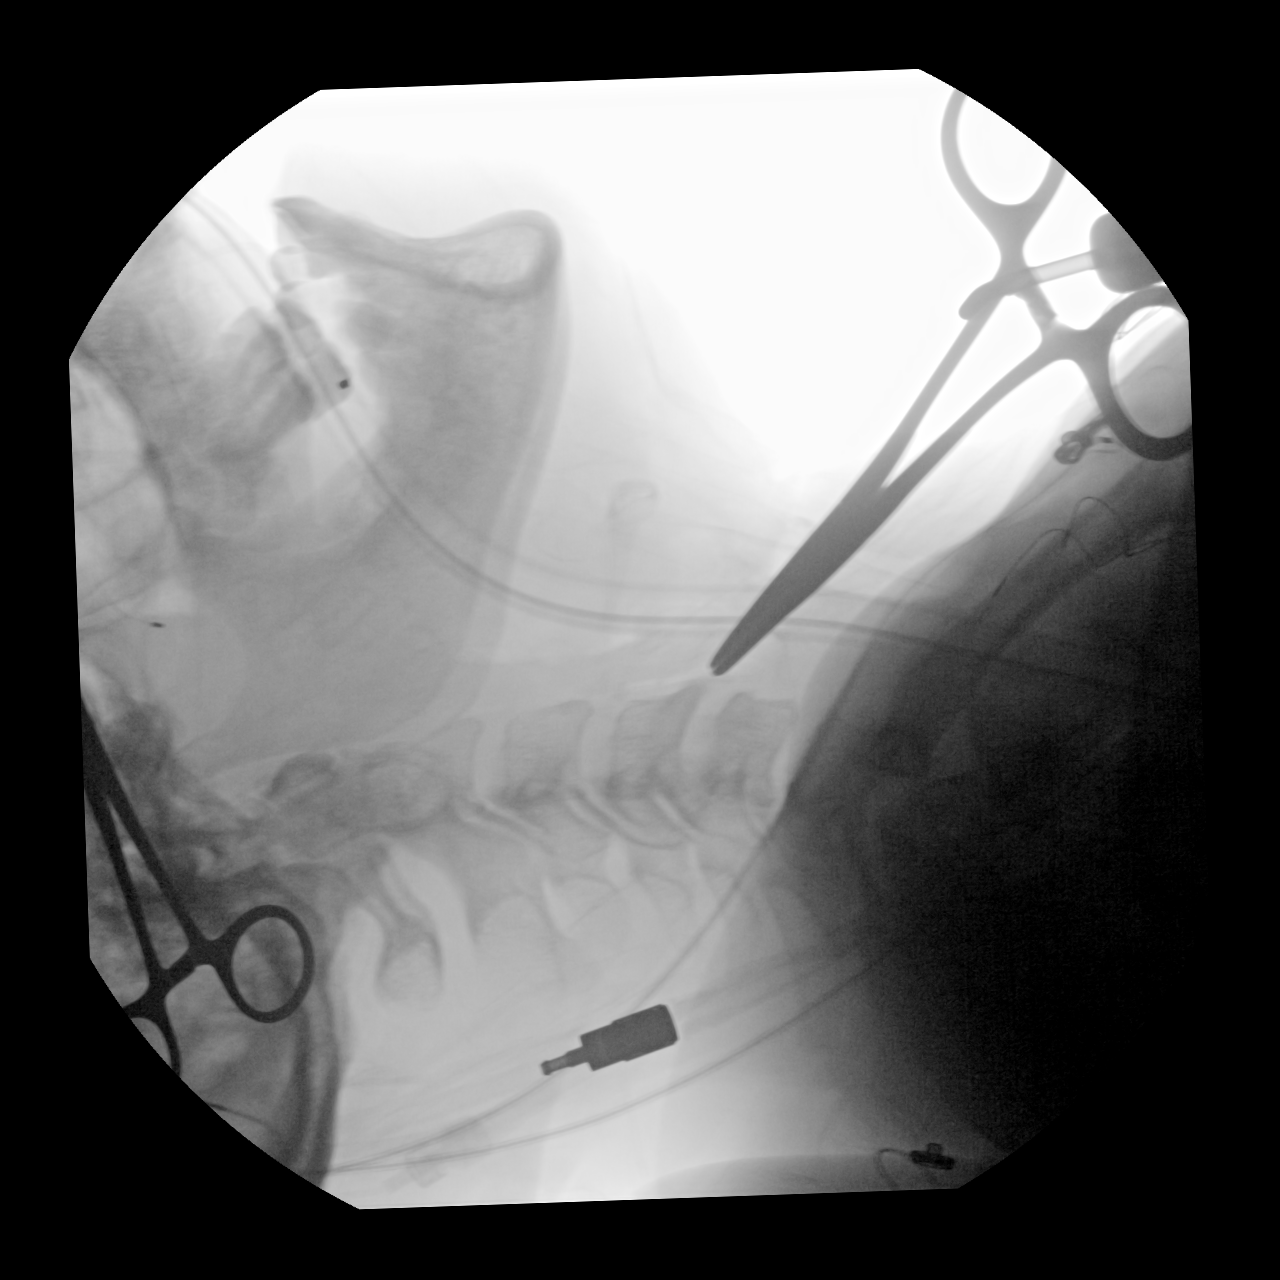
[im 2/3]
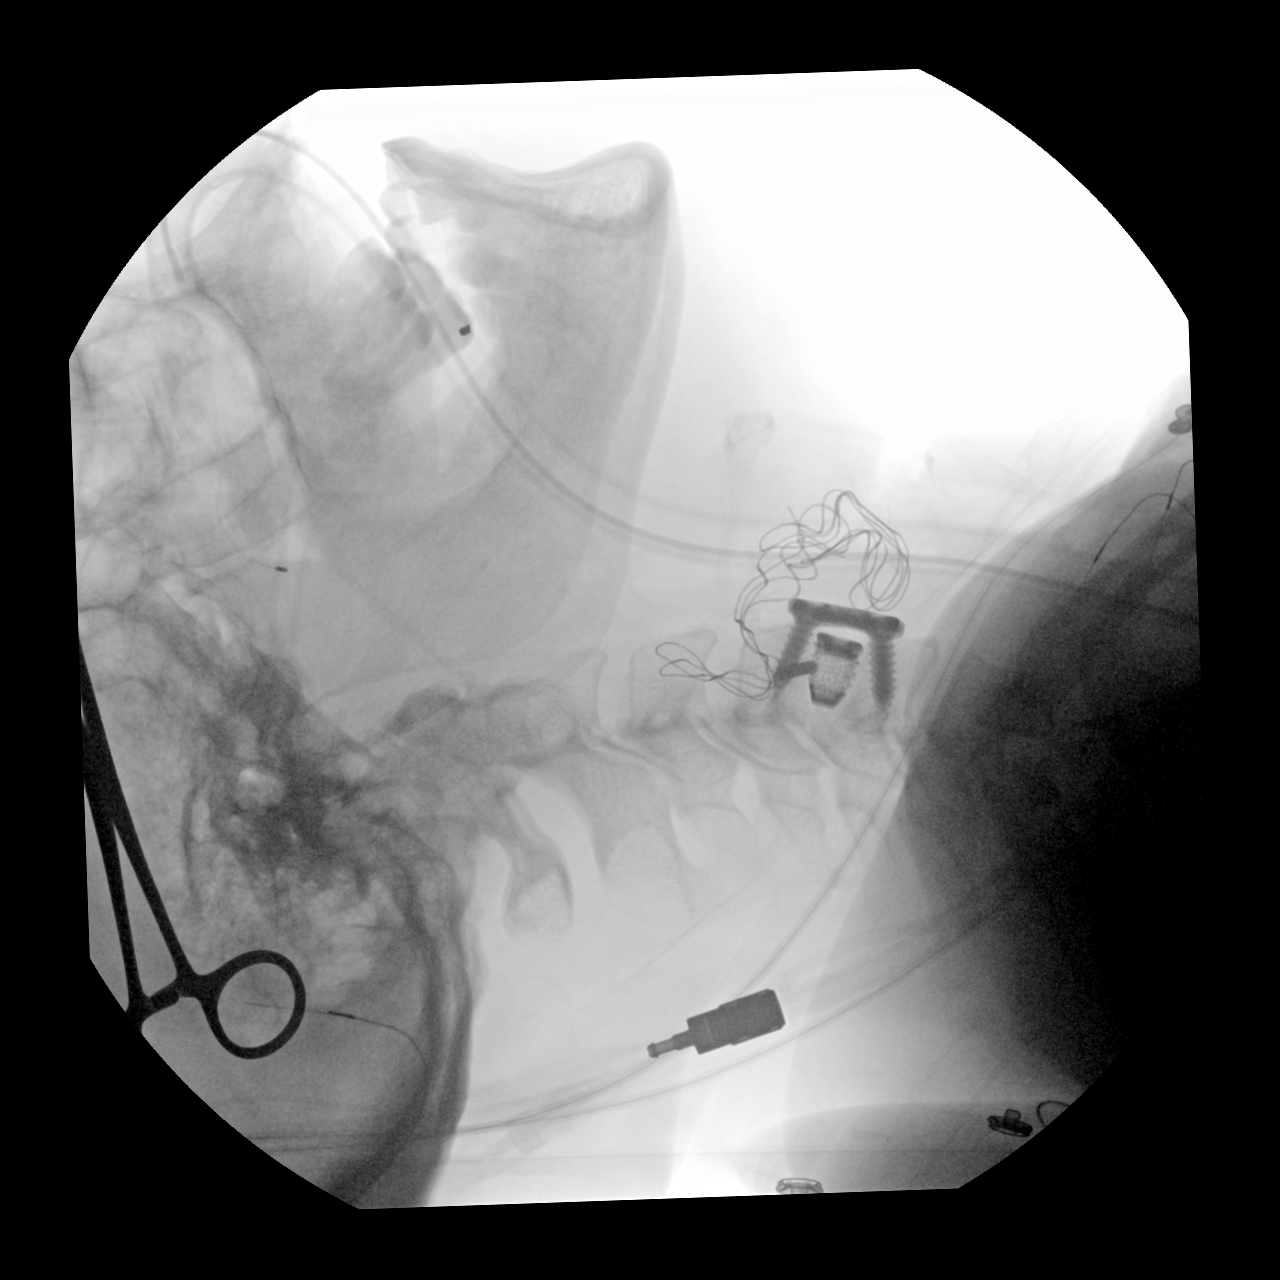
[im 3/3]
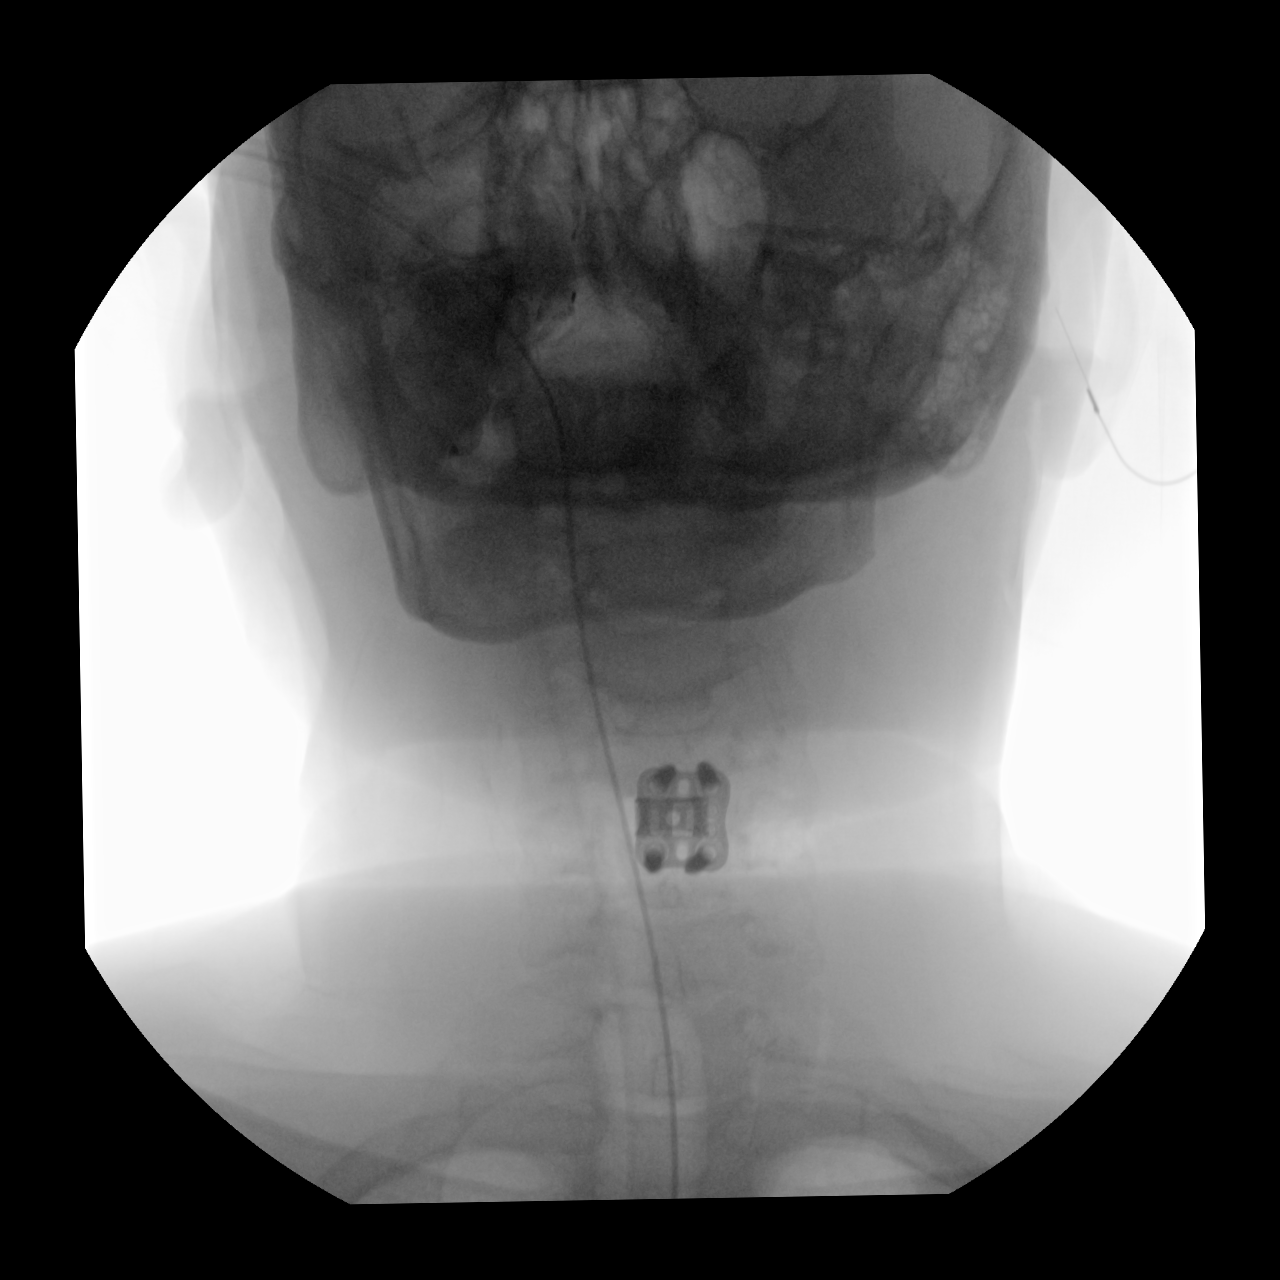

[3 of 3 positions shown; findings below may reference images not displayed]

FINDINGS: PA and lateral view intraoperative fluoroscopic images of the
cervical spine are submitted, 3 images total. On the final images,
ACDF hardware is present at the C4-C5 level. No unexpected finding.
Partially visualized ET tube. A surgical sponge/packing material
projects ventral to the ACDF hardware on the image taken at [DATE]
p.m., but this is no longer present at time of the PA fluoroscopic
image taken at [DATE] p.m.
IMPRESSION: Three intraoperative fluoroscopic images of the cervical spine from
C4-C5 ACDF, as described.

## 2020-07-04 IMAGING — RF DG C-ARM 1-60 MIN
1 series · 3 of 3 positions shown · non-contrast
Comparison: Cervical spine MRI [DATE].

CLINICAL DATA: Surgery, elective. Provided fluoroscopy time: 3
seconds.

EXAM:
CERVICAL SPINE - 2-3 VIEW; DG C-ARM 1-60 MIN

[Series 1: dg x-ray · 0.20mm/px · 3 of 3 slices shown]
[im 1/3]
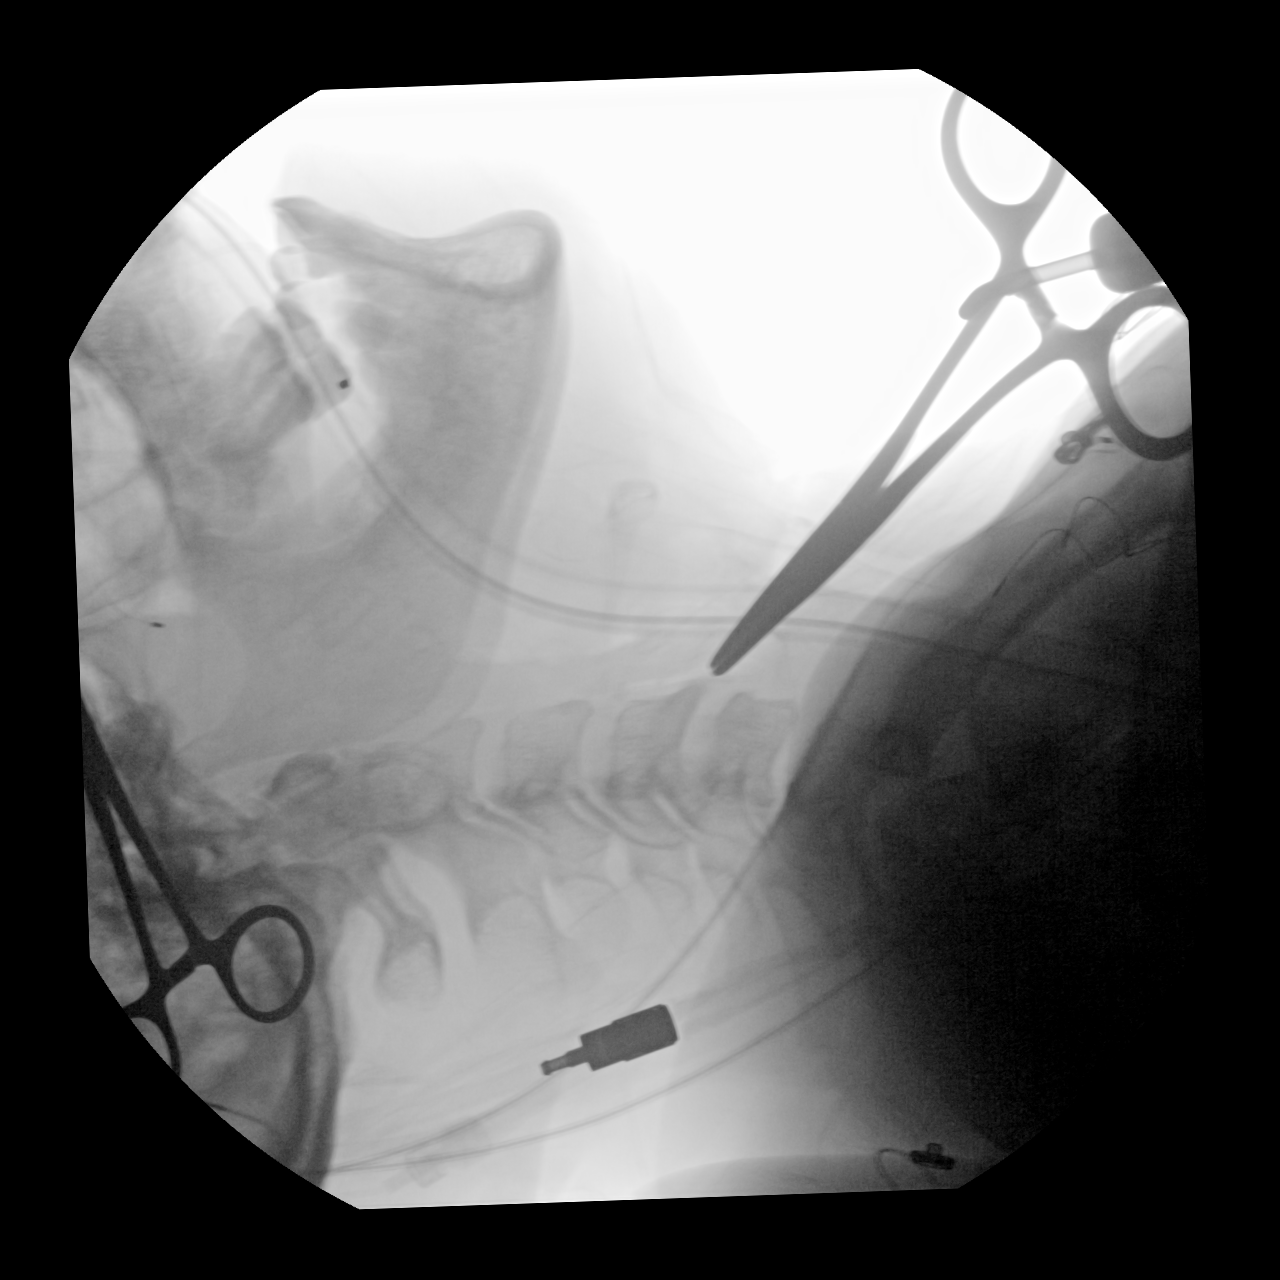
[im 2/3]
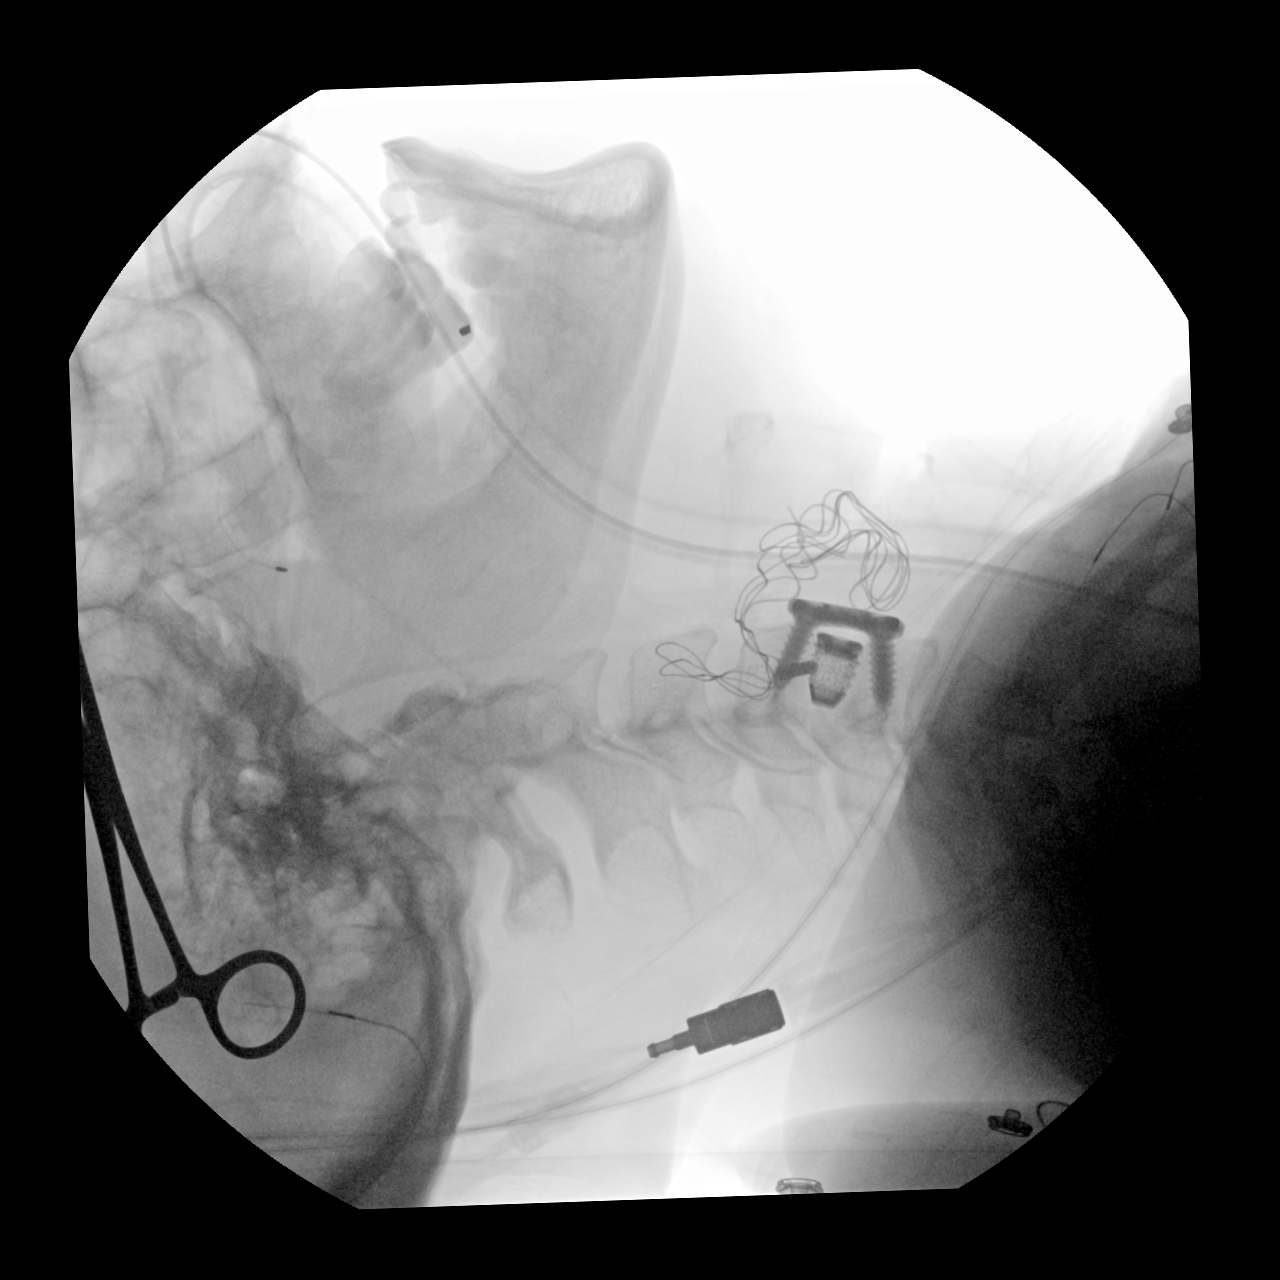
[im 3/3]
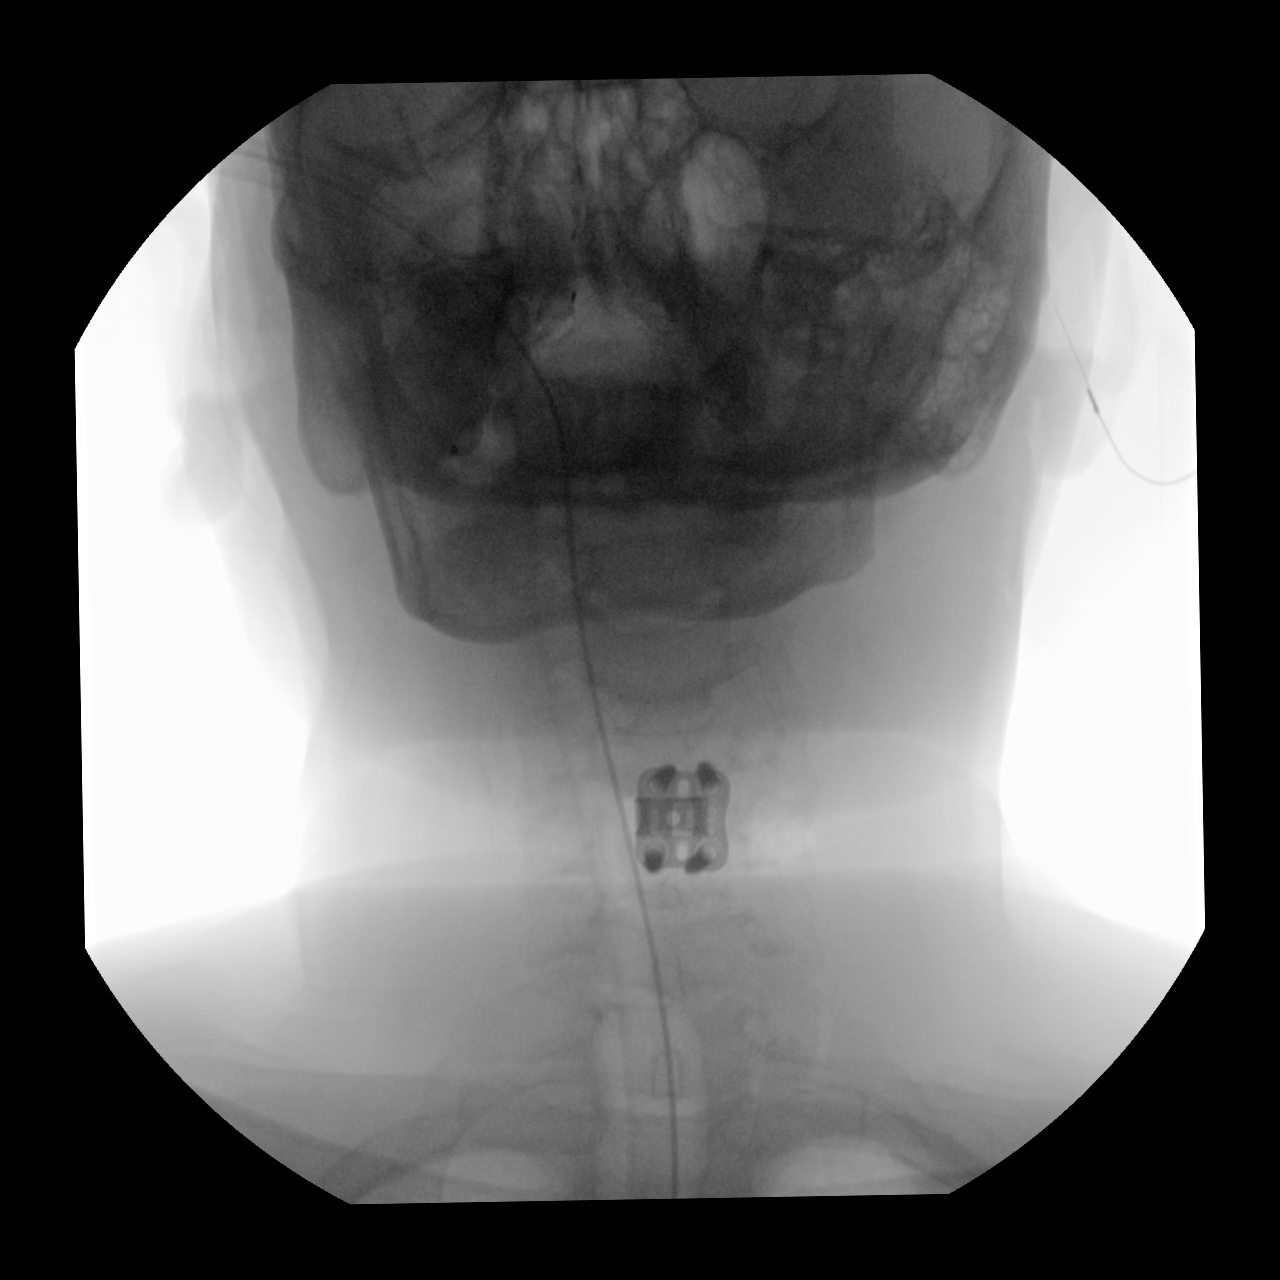

[3 of 3 positions shown; findings below may reference images not displayed]

FINDINGS: PA and lateral view intraoperative fluoroscopic images of the
cervical spine are submitted, 3 images total. On the final images,
ACDF hardware is present at the C4-C5 level. No unexpected finding.
Partially visualized ET tube. A surgical sponge/packing material
projects ventral to the ACDF hardware on the image taken at [DATE]
p.m., but this is no longer present at time of the PA fluoroscopic
image taken at [DATE] p.m.
IMPRESSION: Three intraoperative fluoroscopic images of the cervical spine from
C4-C5 ACDF, as described.

## 2020-07-04 IMAGING — RF DG C-ARM 1-60 MIN
1 series · 3 of 3 positions shown · non-contrast
Comparison: Cervical spine MRI [DATE].

CLINICAL DATA: Surgery, elective. Provided fluoroscopy time: 3
seconds.

EXAM:
DG C-ARM 1-60 MIN

[Series 1: dg x-ray · 0.20mm/px · 3 of 3 slices shown]
[im 1/3]
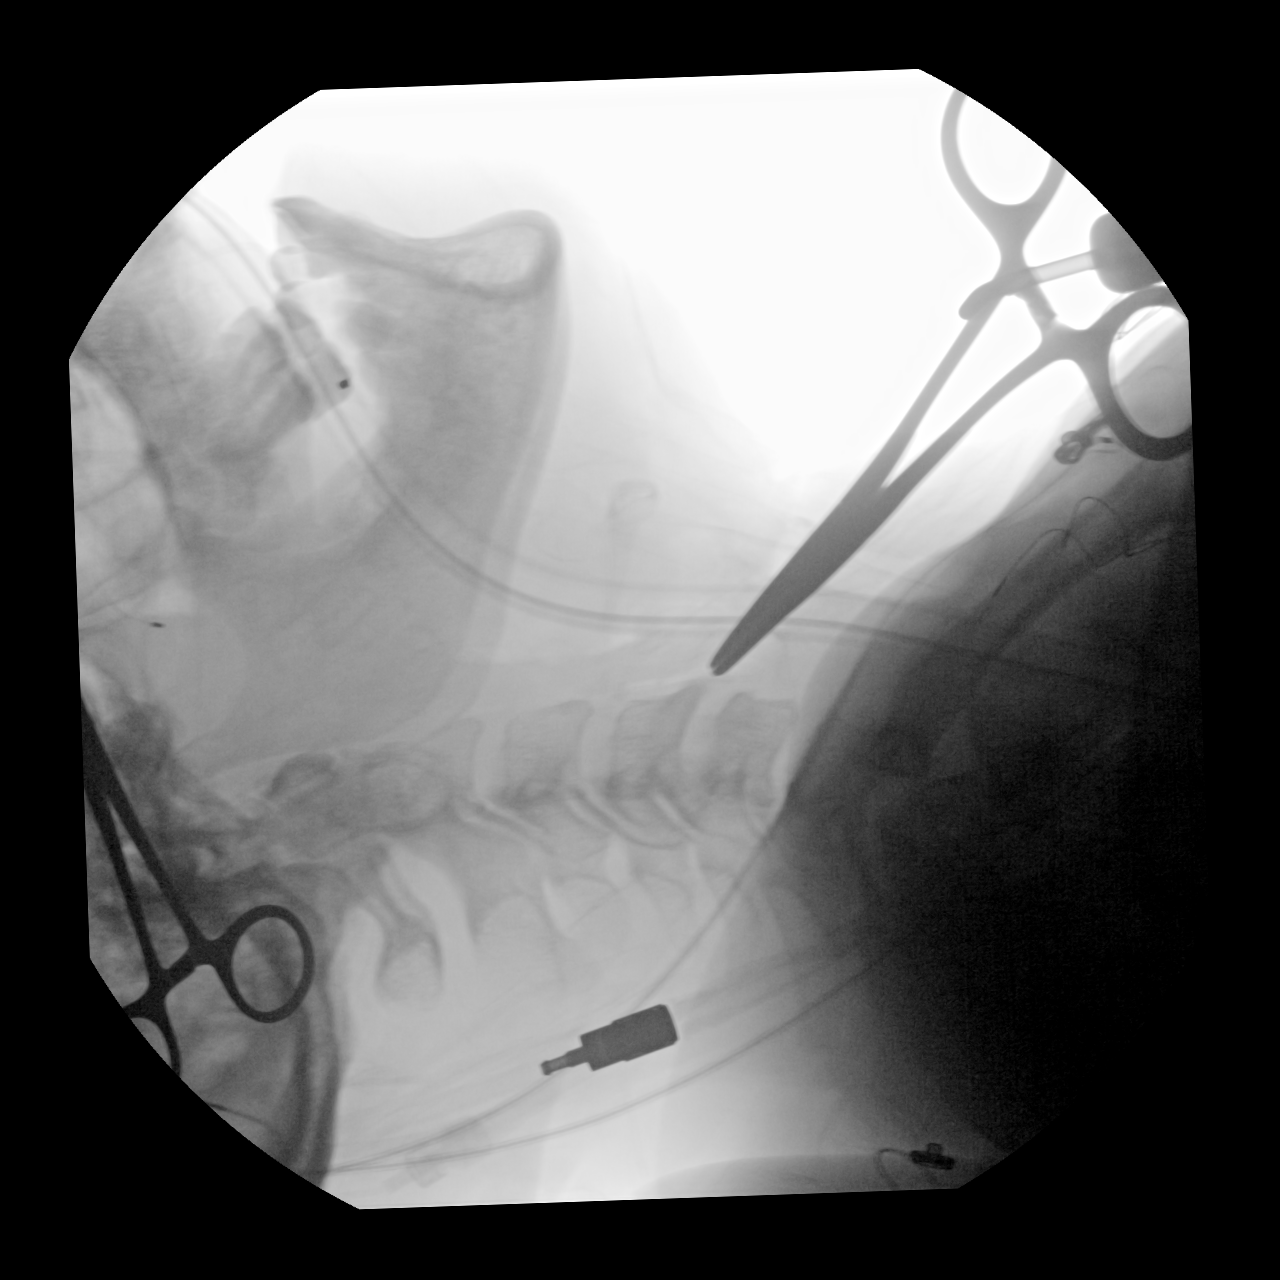
[im 2/3]
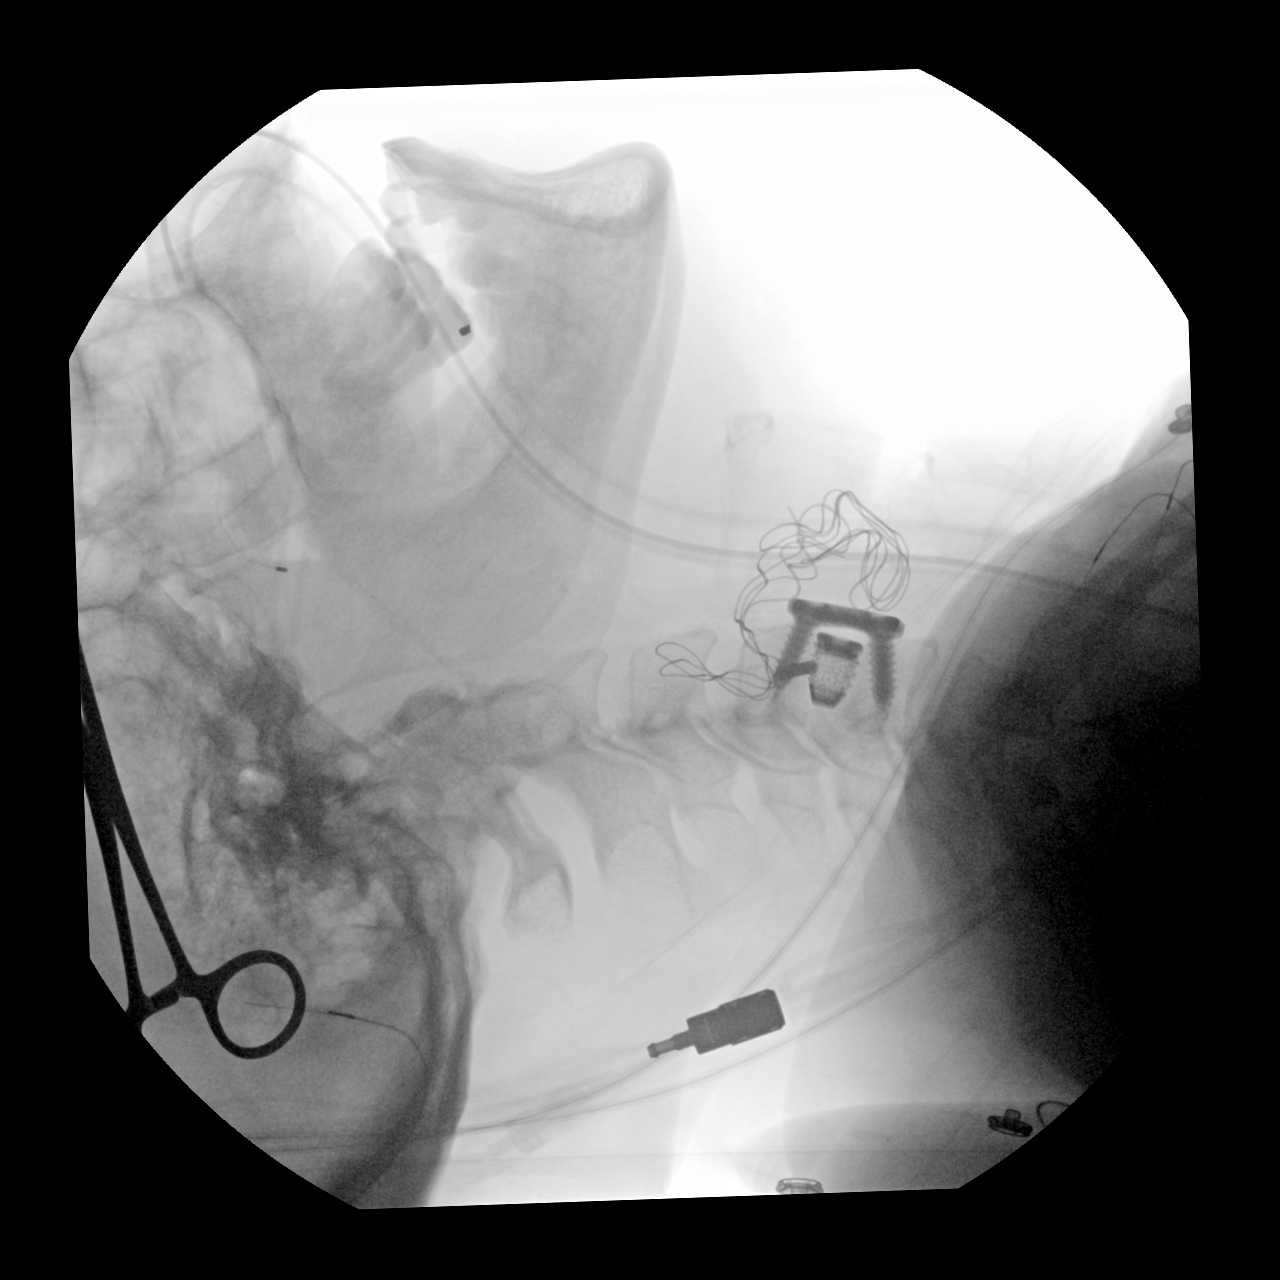
[im 3/3]
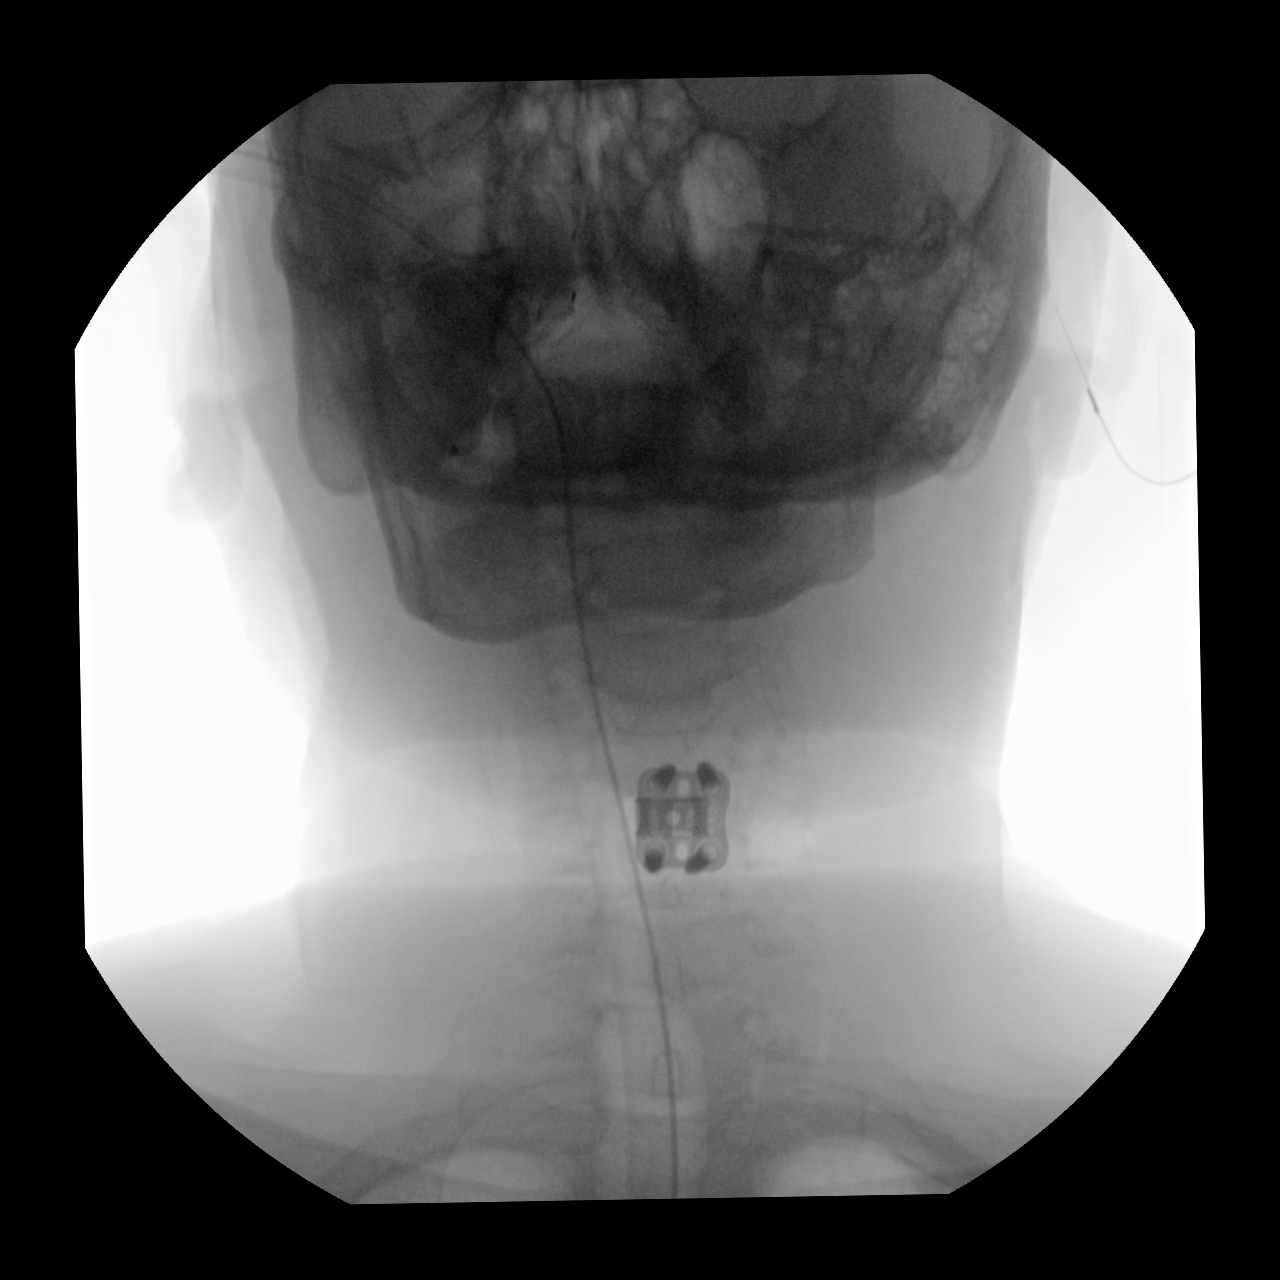

[3 of 3 positions shown; findings below may reference images not displayed]

FINDINGS: PA and lateral view intraoperative fluoroscopic images of the
cervical spine are submitted, 3 images total. On the final images,
ACDF hardware is present at the C4-C5 level. No unexpected finding.
Partially visualized ET tube. A surgical sponge/packing material
projects ventral to the ACDF hardware on the image taken at [DATE]
p.m., but this is no longer present at time of the PA fluoroscopic
image taken at [DATE] p.m.
IMPRESSION: Three intraoperative fluoroscopic images of the cervical spine from
C4-C5 ACDF, as described.

## 2020-07-04 SURGERY — ANTERIOR CERVICAL DECOMPRESSION/DISCECTOMY FUSION 1 LEVEL
Anesthesia: General | Site: Spine Cervical

## 2020-07-04 MED ORDER — FENTANYL CITRATE (PF) 100 MCG/2ML IJ SOLN
INTRAMUSCULAR | Status: AC
  Administered 2020-07-04: 25 ug via INTRAVENOUS
  Filled 2020-07-04: qty 2

## 2020-07-04 MED ORDER — MAGNESIUM SULFATE 2 GM/50ML IV SOLN
2.0000 g | Freq: Once | INTRAVENOUS | Status: AC
  Administered 2020-07-04: 2 g via INTRAVENOUS
  Filled 2020-07-04: qty 50

## 2020-07-04 MED ORDER — CEFAZOLIN SODIUM-DEXTROSE 2-4 GM/100ML-% IV SOLN
INTRAVENOUS | Status: AC
  Filled 2020-07-04: qty 100

## 2020-07-04 MED ORDER — REMIFENTANIL HCL 1 MG IV SOLR
INTRAVENOUS | Status: AC
  Filled 2020-07-04: qty 1000

## 2020-07-04 MED ORDER — SODIUM CHLORIDE 0.9 % IV SOLN
INTRAVENOUS | Status: DC | PRN
  Administered 2020-07-04: .15 ug/kg/min via INTRAVENOUS

## 2020-07-04 MED ORDER — FENTANYL CITRATE (PF) 100 MCG/2ML IJ SOLN
INTRAMUSCULAR | Status: DC | PRN
  Administered 2020-07-04 (×2): 50 ug via INTRAVENOUS

## 2020-07-04 MED ORDER — ACETAMINOPHEN 10 MG/ML IV SOLN
INTRAVENOUS | Status: AC
  Filled 2020-07-04: qty 100

## 2020-07-04 MED ORDER — ONDANSETRON HCL 4 MG/2ML IJ SOLN: 4.0000 mg | Freq: Once | INTRAMUSCULAR | Status: DC | PRN

## 2020-07-04 MED ORDER — FENTANYL CITRATE (PF) 100 MCG/2ML IJ SOLN
25.0000 ug | INTRAMUSCULAR | Status: DC | PRN
  Administered 2020-07-04: 25 ug via INTRAVENOUS

## 2020-07-04 MED ORDER — ACETAMINOPHEN 10 MG/ML IV SOLN
INTRAVENOUS | Status: DC | PRN
  Administered 2020-07-04: 1000 mg via INTRAVENOUS

## 2020-07-04 MED ORDER — PROPOFOL 10 MG/ML IV BOLUS
INTRAVENOUS | Status: DC | PRN
Start: 2020-07-04 — End: 2020-07-04
  Administered 2020-07-04: 50 mg via INTRAVENOUS
  Administered 2020-07-04: 30 mg via INTRAVENOUS
  Administered 2020-07-04: 170 mg via INTRAVENOUS

## 2020-07-04 MED ORDER — PHENYLEPHRINE HCL-NACL 10-0.9 MG/250ML-% IV SOLN: INTRAVENOUS | Status: DC | PRN

## 2020-07-04 MED ORDER — THROMBIN 5000 UNITS EX SOLR
CUTANEOUS | Status: AC
  Filled 2020-07-04: qty 5000

## 2020-07-04 MED ORDER — EPINEPHRINE PF 1 MG/ML IJ SOLN
INTRAMUSCULAR | Status: AC
  Filled 2020-07-04: qty 1

## 2020-07-04 MED ORDER — LIDOCAINE HCL (CARDIAC) PF 100 MG/5ML IV SOSY
PREFILLED_SYRINGE | INTRAVENOUS | Status: DC | PRN
  Administered 2020-07-04: 100 mg via INTRAVENOUS

## 2020-07-04 MED ORDER — DEXAMETHASONE SODIUM PHOSPHATE 10 MG/ML IJ SOLN
INTRAMUSCULAR | Status: AC
  Filled 2020-07-04: qty 1

## 2020-07-04 MED ORDER — PROPOFOL 500 MG/50ML IV EMUL
INTRAVENOUS | Status: AC
  Filled 2020-07-04: qty 50

## 2020-07-04 MED ORDER — SODIUM CHLORIDE 0.9 % IV SOLN
INTRAVENOUS | Status: DC | PRN
  Administered 2020-07-04: 25 ug/min via INTRAVENOUS

## 2020-07-04 MED ORDER — ONDANSETRON HCL 4 MG/2ML IJ SOLN
INTRAMUSCULAR | Status: DC | PRN
  Administered 2020-07-04: 4 mg via INTRAVENOUS

## 2020-07-04 MED ORDER — PROPOFOL 500 MG/50ML IV EMUL
INTRAVENOUS | Status: DC | PRN
  Administered 2020-07-04: 100 ug/kg/min via INTRAVENOUS

## 2020-07-04 MED ORDER — FENTANYL CITRATE (PF) 100 MCG/2ML IJ SOLN
INTRAMUSCULAR | Status: AC
  Filled 2020-07-04: qty 2

## 2020-07-04 MED ORDER — IPRATROPIUM-ALBUTEROL 0.5-2.5 (3) MG/3ML IN SOLN
RESPIRATORY_TRACT | Status: AC
  Administered 2020-07-04: 3 mL via RESPIRATORY_TRACT
  Filled 2020-07-04: qty 3

## 2020-07-04 MED ORDER — VANCOMYCIN HCL IN DEXTROSE 1-5 GM/200ML-% IV SOLN
INTRAVENOUS | Status: AC
  Filled 2020-07-04: qty 200

## 2020-07-04 MED ORDER — SUCCINYLCHOLINE CHLORIDE 20 MG/ML IJ SOLN
INTRAMUSCULAR | Status: DC | PRN
  Administered 2020-07-04: 100 mg via INTRAVENOUS

## 2020-07-04 MED ORDER — SUCCINYLCHOLINE CHLORIDE 200 MG/10ML IV SOSY
PREFILLED_SYRINGE | INTRAVENOUS | Status: AC
  Filled 2020-07-04: qty 10

## 2020-07-04 MED ORDER — NICOTINE 14 MG/24HR TD PT24
14.0000 mg | MEDICATED_PATCH | Freq: Every day | TRANSDERMAL | Status: DC
  Administered 2020-07-04 – 2020-07-11 (×8): 14 mg via TRANSDERMAL
  Filled 2020-07-04 (×9): qty 1

## 2020-07-04 MED ORDER — MIDAZOLAM HCL 2 MG/2ML IJ SOLN
INTRAMUSCULAR | Status: AC
  Filled 2020-07-04: qty 2

## 2020-07-04 MED ORDER — BUPIVACAINE HCL (PF) 0.5 % IJ SOLN
INTRAMUSCULAR | Status: AC
  Filled 2020-07-04: qty 30

## 2020-07-04 MED ORDER — IPRATROPIUM-ALBUTEROL 0.5-2.5 (3) MG/3ML IN SOLN: 3.0000 mL | Freq: Once | RESPIRATORY_TRACT | Status: AC

## 2020-07-04 MED ORDER — LACTATED RINGERS IV SOLN: INTRAVENOUS | Status: DC | PRN

## 2020-07-04 MED ORDER — MIDAZOLAM HCL 2 MG/2ML IJ SOLN
INTRAMUSCULAR | Status: DC | PRN
  Administered 2020-07-04: 2 mg via INTRAVENOUS

## 2020-07-04 MED ORDER — DEXAMETHASONE SODIUM PHOSPHATE 10 MG/ML IJ SOLN
INTRAMUSCULAR | Status: DC | PRN
  Administered 2020-07-04: 10 mg via INTRAVENOUS

## 2020-07-04 MED ORDER — VANCOMYCIN HCL IN DEXTROSE 1-5 GM/200ML-% IV SOLN
1000.0000 mg | Freq: Once | INTRAVENOUS | Status: AC
  Administered 2020-07-04: 1000 mg via INTRAVENOUS

## 2020-07-04 MED ORDER — THROMBIN 5000 UNITS EX SOLR
CUTANEOUS | Status: DC | PRN
  Administered 2020-07-04: 5000 [IU] via TOPICAL

## 2020-07-04 MED ORDER — POTASSIUM CHLORIDE 10 MEQ/100ML IV SOLN
10.0000 meq | INTRAVENOUS | Status: AC
  Administered 2020-07-04 (×3): 10 meq via INTRAVENOUS
  Filled 2020-07-04 (×3): qty 100

## 2020-07-04 MED ORDER — LIDOCAINE HCL (PF) 2 % IJ SOLN
INTRAMUSCULAR | Status: AC
  Filled 2020-07-04: qty 5

## 2020-07-04 MED ORDER — ONDANSETRON HCL 4 MG/2ML IJ SOLN
INTRAMUSCULAR | Status: AC
  Filled 2020-07-04: qty 2

## 2020-07-04 MED ORDER — PHENYLEPHRINE HCL (PRESSORS) 10 MG/ML IV SOLN
INTRAVENOUS | Status: DC | PRN
  Administered 2020-07-04 (×4): 100 ug via INTRAVENOUS

## 2020-07-04 MED ORDER — BUPIVACAINE-EPINEPHRINE (PF) 0.5% -1:200000 IJ SOLN
INTRAMUSCULAR | Status: DC | PRN
  Administered 2020-07-04: 9 mL

## 2020-07-04 SURGICAL SUPPLY — 59 items
ADH SKN CLS APL DERMABOND .7 (GAUZE/BANDAGES/DRESSINGS) ×1
AGENT HMST MTR 8 SURGIFLO (HEMOSTASIS) ×1
APL PRP STRL LF DISP 70% ISPRP (MISCELLANEOUS) ×2
BASKET BONE COLLECTION (BASKET) IMPLANT
BULB RESERV EVAC DRAIN JP 100C (MISCELLANEOUS) IMPLANT
BUR NEURO DRILL SOFT 3.0X3.8M (BURR) ×2 IMPLANT
CHLORAPREP W/TINT 26 (MISCELLANEOUS) ×4 IMPLANT
COUNTER NEEDLE 20/40 LG (NEEDLE) ×2 IMPLANT
COVER WAND RF STERILE (DRAPES) ×2 IMPLANT
CUP MEDICINE 2OZ PLAST GRAD ST (MISCELLANEOUS) ×2 IMPLANT
DERMABOND ADVANCED (GAUZE/BANDAGES/DRESSINGS) ×1
DERMABOND ADVANCED .7 DNX12 (GAUZE/BANDAGES/DRESSINGS) ×1 IMPLANT
DRAIN CHANNEL JP 10F RND 20C F (MISCELLANEOUS) IMPLANT
DRAPE C ARM PK CFD 31 SPINE (DRAPES) ×2 IMPLANT
DRAPE LAPAROTOMY 77X122 PED (DRAPES) ×2 IMPLANT
DRAPE MICROSCOPE SPINE 48X150 (DRAPES) ×2 IMPLANT
DRAPE SURG 17X11 SM STRL (DRAPES) ×8 IMPLANT
ELECT CAUTERY BLADE TIP 2.5 (TIP) ×2
ELECT REM PT RETURN 9FT ADLT (ELECTROSURGICAL) ×2
ELECTRODE CAUTERY BLDE TIP 2.5 (TIP) ×1 IMPLANT
ELECTRODE REM PT RTRN 9FT ADLT (ELECTROSURGICAL) ×1 IMPLANT
FEE INTRAOP MONITOR IMPULS NCS (MISCELLANEOUS) IMPLANT
GLOVE SURG SYN 8.5  E (GLOVE) ×3
GLOVE SURG SYN 8.5 E (GLOVE) ×3 IMPLANT
GLOVE SURG SYN 8.5 PF PI (GLOVE) ×3 IMPLANT
GOWN SRG XL LVL 3 NONREINFORCE (GOWNS) ×1 IMPLANT
GOWN STRL NON-REIN TWL XL LVL3 (GOWNS) ×2
GOWN STRL REUS W/ TWL XL LVL3 (GOWN DISPOSABLE) ×1 IMPLANT
GOWN STRL REUS W/TWL XL LVL3 (GOWN DISPOSABLE) ×2
GRADUATE 1200CC STRL 31836 (MISCELLANEOUS) ×2 IMPLANT
INTRAOP MONITOR FEE IMPULS NCS (MISCELLANEOUS) ×1
INTRAOP MONITOR FEE IMPULSE (MISCELLANEOUS) ×1
KIT TURNOVER KIT A (KITS) ×2 IMPLANT
MANIFOLD NEPTUNE II (INSTRUMENTS) ×2 IMPLANT
MARKER SKIN DUAL TIP RULER LAB (MISCELLANEOUS) ×4 IMPLANT
NDL SAFETY ECLIPSE 18X1.5 (NEEDLE) ×1 IMPLANT
NEEDLE HYPO 18GX1.5 SHARP (NEEDLE) ×2
NEEDLE HYPO 22GX1.5 SAFETY (NEEDLE) ×2 IMPLANT
NS IRRIG 1000ML POUR BTL (IV SOLUTION) ×2 IMPLANT
PACK LAMINECTOMY NEURO (CUSTOM PROCEDURE TRAY) ×2 IMPLANT
PAD ARMBOARD 7.5X6 YLW CONV (MISCELLANEOUS) ×2 IMPLANT
PIN CASPAR 14 (PIN) ×1 IMPLANT
PIN CASPAR 14MM (PIN) ×2 IMPLANT
PLATE ANT CERV XTEND 1 LV 12 (Plate) ×1 IMPLANT
PUTTY DBX 1CC (Putty) ×2 IMPLANT
PUTTY DBX 1CC DEPUY (Putty) IMPLANT
SCREW VAR 4.2 XD SELF DRILL 16 (Screw) ×1 IMPLANT
SPACER HEDRON C 12X14X8 7D (Spacer) ×1 IMPLANT
SPOGE SURGIFLO 8M (HEMOSTASIS) ×1
SPONGE KITTNER 5P (MISCELLANEOUS) ×2 IMPLANT
SPONGE SURGIFLO 8M (HEMOSTASIS) ×1 IMPLANT
STAPLER SKIN PROX 35W (STAPLE) IMPLANT
SUT V-LOC 90 ABS DVC 3-0 CL (SUTURE) ×2 IMPLANT
SUT VIC AB 3-0 SH 8-18 (SUTURE) ×2 IMPLANT
SYR 30ML LL (SYRINGE) ×2 IMPLANT
TAPE CLOTH 3X10 WHT NS LF (GAUZE/BANDAGES/DRESSINGS) ×2 IMPLANT
TOWEL OR 17X26 4PK STRL BLUE (TOWEL DISPOSABLE) ×6 IMPLANT
TRAY FOLEY MTR SLVR 16FR STAT (SET/KITS/TRAYS/PACK) IMPLANT
TUBING CONNECTING 10 (TUBING) ×2 IMPLANT

## 2020-07-04 NOTE — Op Note (Signed)
Indications: [facility] is suffering from central cord syndrome with significant weakness. Due to worsening symptoms, I recommended surgery.  Findings: severe stenosis  Preoperative Diagnosis: Cervical Central Cord syndrome Postoperative Diagnosis: same   EBL: 100 ml IVF: 700 ml Drains: none Disposition: Extubated and Stable to PACU Complications: none  No foley catheter was placed.   Preoperative Note:   Risks of surgery discussed include: infection, bleeding, stroke, coma, death, paralysis, CSF leak, nerve/spinal cord injury, numbness, tingling, weakness, complex regional pain syndrome, recurrent stenosis and/or disc herniation, vascular injury, development of instability, neck/back pain, need for further surgery, persistent symptoms, development of deformity, and the risks of anesthesia. The patient understood these risks and agreed to proceed.  Operative Note:   Procedure:  1) Anterior cervical diskectomy and fusion at C4-5 2) Anterior cervical instrumentation at C4-5 using Globus Xtend 3) Insertion of biomechanical device at C4-5 using Globus Hedron C   Procedure: After obtaining informed consent, the patient taken to the operating room, placed in supine position, general anesthesia induced.  The patient had a small shoulder roll placed behind their shoulders.  The patient received preop antibiotics and IV Decadron.  The patient had a neck incision outlined, was prepped and draped in usual sterile fashion. The incision was injected with local anesthetic.   An incision was opened, dissection taken down medial to the carotid artery and jugular vein, lateral to the trachea and esophagus.  The prevertebral fascia was identified, and a localizing x-ray demonstrated the correct level.  The longus colli were dissected laterally, and self-retaining retractors placed to open the operative field. The microscope was then brought into the field.  With this complete, distractor pins were  placed in the vertebral bodies of C4 and C5. The distractor was placed, and the anulus at C4/5 was opened using a bovie.  Curettes and pituitary rongeurs used to remove the majority of disk, then the drill was used to remove the posterior osteophyte and begin the foraminotomies. The nerve hook was used to elevate the posterior longitudinal ligament, which was then removed with Kerrison rongeurs. A large posterior disc herniation was noted both centrally and behind the C4 and C5 vertebral bodies.  With great care, the majority of this disc herniation was removed.  Bilateral foraminotomies were performed. The microblunt nerve hook could be passed out the foramen bilaterally.   Meticulous hemostasis was obtained.  A biomechanical device (Globus Hedron C 8 mm height x 14 mm width by 12 mm depth) was placed at C4/5. The device had been filled with allograft for aid in arthrodesis.  The caspar distractor was removed, and bone wax used for hemostasis. A 12 mm Globus Xtend plate was chosen.  Two screws placed in each vertebral body, respectively making sure the screws were behind the locking mechanism.  Final AP and lateral radiographs were taken.   With everything in good position, the wound was irrigated copiously with bacitracin-containing solution and meticulous hemostasis obtained.  Wound was closed in 2 layers using interrupted inverted 3-0 Vicryl sutures.  The wound was dressed with dermabond, the head of bed at 30 degrees, taken to recovery room in stable condition.  No new postop neurological deficits were identified.  Sponge and pattie counts were correct at the end of the procedure.   I performed the entire procedure.  Monitoring was stable throughout.  Venetia Night MD

## 2020-07-04 NOTE — Anesthesia Preprocedure Evaluation (Signed)
Anesthesia Evaluation  Patient identified by MRN, date of birth, ID band Patient awake    Reviewed: Allergy & Precautions, H&P , NPO status , Patient's Chart, lab work & pertinent test results, reviewed documented beta blocker date and time   Airway Mallampati: III  TM Distance: >3 FB Neck ROM: full    Dental  (+) Poor Dentition   Pulmonary neg pulmonary ROS, Current Smoker,    Pulmonary exam normal        Cardiovascular Exercise Tolerance: Poor negative cardio ROS Normal cardiovascular exam Rhythm:regular Rate:Normal     Neuro/Psych PSYCHIATRIC DISORDERS negative neurological ROS     GI/Hepatic negative GI ROS, Neg liver ROS,   Endo/Other  negative endocrine ROS  Renal/GU negative Renal ROS  negative genitourinary   Musculoskeletal   Abdominal   Peds  Hematology negative hematology ROS (+)   Anesthesia Other Findings History reviewed. No pertinent past medical history. History reviewed. No pertinent surgical history. BMI    Body Mass Index: 36.02 kg/m     Reproductive/Obstetrics negative OB ROS                             Anesthesia Physical Anesthesia Plan  ASA: II  Anesthesia Plan: General ETT   Post-op Pain Management:    Induction:   PONV Risk Score and Plan:   Airway Management Planned:   Additional Equipment:   Intra-op Plan:   Post-operative Plan:   Informed Consent: I have reviewed the patients History and Physical, chart, labs and discussed the procedure including the risks, benefits and alternatives for the proposed anesthesia with the patient or authorized representative who has indicated his/her understanding and acceptance.     Dental Advisory Given  Plan Discussed with: CRNA  Anesthesia Plan Comments: (Positive cocaine history and recent use but Neurosurgery is declaring urgent status to proceed secondary to the central cord syndrome. ja)         Anesthesia Quick Evaluation

## 2020-07-04 NOTE — Progress Notes (Addendum)
Progress Note    Yvette Phillips  OFH:[id number] DOB: 1960-07-27  DOA: 07/03/2020 PCP: Patient, No Pcp Per      Brief Narrative:    Medical records reviewed and are as summarized below:  Yvette Phillips is a 60 y.o. female       Assessment/Plan:   Principal Problem:   Central cord syndrome (HCC) Active Problems:   Alcohol abuse   Polysubstance abuse (HCC)   Nicotine dependence, cigarettes, uncomplicated   Hypokalemia   Hypomagnesemia    Body mass index is 36.02 kg/m.  (Morbid obesity)   Large disc herniation at C4-5, Central cord syndrome with bilateral upper and lower extremity weakness: She has been evaluated by neurosurgeon with plan for surgical intervention today.  Hypokalemia and hypomagnesemia: Replete potassium and magnesium intravenously since she is n.p.o. for surgery.  Polysubstance abuse (cocaine, cannabinoids, alcohol, tobacco): She has been counseled to quit.  Ativan as needed for alcohol withdrawal syndrome.        Diet Order            Diet NPO time specified  Diet effective now                    Consultants:  Neurosurgeon  Procedures:  None    Medications:   . folic acid  1 mg Oral Daily  . LORazepam  0-4 mg Oral Q6H   Followed by  . [START ON 07/05/2020] LORazepam  0-4 mg Oral Q12H  . midodrine  5 mg Oral Q8H  . multivitamin with minerals  1 tablet Oral Daily  . nicotine  14 mg Transdermal Daily  . thiamine  100 mg Oral Daily   Or  . thiamine  100 mg Intravenous Daily   Continuous Infusions: . lactated ringers 100 mL/hr at 07/04/20 0125  . potassium chloride 10 mEq (07/04/20 1109)     Anti-infectives (From admission, onward)   None             Family Communication/Anticipated D/C date and plan/Code Status   DVT prophylaxis:      Code Status: Full Code  Family Communication: None Disposition Plan:    Status is: Inpatient  Remains inpatient appropriate because:Inpatient level of care  appropriate due to severity of illness   Dispo: The patient is from: Home              Anticipated d/c is to: SNF              Anticipated d/c date is: > 3 days              Patient currently is not medically stable to d/c.   Difficult to place patient No           Subjective:   She complains of weakness in both upper and lower extremities.  No other complaints.  Objective:    Vitals:   07/04/20 0017 07/04/20 0537 07/04/20 0834 07/04/20 1114  BP: 124/68 121/72 111/65 116/69  Pulse: 80 83 90 83  Resp: 16 16 16 18   Temp: 98 F (36.7 C) 98.1 F (36.7 C) 98.6 F (37 C) 98.2 F (36.8 C)  TempSrc:  Oral    SpO2: 98% 97% 97% 96%  Weight:      Height:       No data found.   Intake/Output Summary (Last 24 hours) at 07/04/2020 1149 Last data filed at 07/04/2020 0321 Gross per 24 hour  Intake 1126.76 ml  Output --  Net 1126.76 ml   Filed Weights   07/03/20 1213  Weight: 104.3 kg    Exam:  GEN: NAD SKIN: No rash EYES: EOMI ENT: MMM CV: RRR PULM: CTA B ABD: soft, ND, NT, +BS CNS: AAO x 3, power was 4/5 in bilateral upper extremities.  Power is 3/5 in bilateral lower extremities EXT: No edema or tenderness        Data Reviewed:   I have personally reviewed following labs and imaging studies:  Labs: Labs show the following:   Basic Metabolic Panel: Recent Labs  Lab 07/03/20 1225 07/04/20 0519  NA 136 137  K 4.1 3.4*  CL 95* 100  CO2 24 26  GLUCOSE 75 83  BUN 13 11  CREATININE 1.01* 0.83  CALCIUM 9.9 9.2  MG  --  1.5*   GFR Estimated Creatinine Clearance: 88.4 mL/min (by C-G formula based on SCr of 0.83 mg/dL). Liver Function Tests: Recent Labs  Lab 07/04/20 0519  AST 22  ALT 13  ALKPHOS 53  BILITOT 1.3*  PROT 6.1*  ALBUMIN 3.0*   No results for input(s): LIPASE, AMYLASE in the last 168 hours. No results for input(s): AMMONIA in the last 168 hours. Coagulation profile Recent Labs  Lab 07/04/20 0837  INR 1.0     CBC: Recent Labs  Lab 07/03/20 1225 07/04/20 0519  WBC 9.3 7.5  NEUTROABS 6.1 4.6  HGB 15.5* 13.4  HCT 46.5* 39.7  MCV 106.7* 106.4*  PLT 287 263   Cardiac Enzymes: Recent Labs  Lab 07/03/20 1347 07/04/20 0519  CKTOTAL 375* 279*   BNP (last 3 results) No results for input(s): PROBNP in the last 8760 hours. CBG: No results for input(s): GLUCAP in the last 168 hours. D-Dimer: No results for input(s): DDIMER in the last 72 hours. Hgb A1c: No results for input(s): HGBA1C in the last 72 hours. Lipid Profile: No results for input(s): CHOL, HDL, LDLCALC, TRIG, CHOLHDL, LDLDIRECT in the last 72 hours. Thyroid function studies: No results for input(s): TSH, T4TOTAL, T3FREE, THYROIDAB in the last 72 hours.  Invalid input(s): FREET3 Anemia work up: No results for input(s): VITAMINB12, FOLATE, FERRITIN, TIBC, IRON, RETICCTPCT in the last 72 hours. Sepsis Labs: Recent Labs  Lab 07/03/20 1225 07/04/20 0519  WBC 9.3 7.5    Microbiology Recent Results (from the past 240 hour(s))  SARS CORONAVIRUS 2 (TAT 6-24 HRS) Nasopharyngeal Nasopharyngeal Swab     Status: None   Collection Time: 07/03/20  6:17 PM   Specimen: Nasopharyngeal Swab  Result Value Ref Range Status   SARS Coronavirus 2 NEGATIVE NEGATIVE Final    Comment: (NOTE) SARS-CoV-2 target nucleic acids are NOT DETECTED.  The SARS-CoV-2 RNA is generally detectable in upper and lower respiratory specimens during the acute phase of infection. Negative results do not preclude SARS-CoV-2 infection, do not rule out co-infections with other pathogens, and should not be used as the sole basis for treatment or other patient management decisions. Negative results must be combined with clinical observations, patient history, and epidemiological information. The expected result is Negative.  Fact Sheet for Patients: HairSlick.no  Fact Sheet for Healthcare  Providers: quierodirigir.com  This test is not yet approved or cleared by the [place] FDA and  has been authorized for detection and/or diagnosis of SARS-CoV-2 by FDA under an Emergency Use Authorization (EUA). This EUA will remain  in effect (meaning this test can be used) for the duration of the COVID-19 declaration under Se ction 564(b)(1) of the Act, 21 U.S.C.  section 360bbb-3(b)(1), unless the authorization is terminated or revoked sooner.  Performed at [facility], [address], [place], [place] [address]     Procedures and diagnostic studies:  DG Knee 2 Views Left  Result Date: 07/03/2020 CLINICAL DATA:  Left knee pain EXAM: LEFT KNEE - 1-2 VIEW COMPARISON:  05/31/2020 FINDINGS: Frontal and cross-table lateral views of the left knee are obtained. No acute fracture, subluxation, or dislocation. Stable 3 compartmental osteoarthritis greatest in the medial compartment. No joint effusion. Soft tissues are unremarkable. IMPRESSION: 1. Stable osteoarthritis.  No acute fracture. Electronically Signed   By: Sharlet Salina M.D.   On: 07/03/2020 18:23   MR BRAIN WO CONTRAST  Result Date: 07/03/2020 CLINICAL DATA:  Found down EXAM: MRI HEAD WITHOUT CONTRAST TECHNIQUE: Multiplanar, multiecho pulse sequences of the brain and surrounding structures were obtained without intravenous contrast. COMPARISON:  None. FINDINGS: Brain: No acute infarct, mass effect or extra-axial collection. No acute or chronic hemorrhage. 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 The midline structures are normal. Vascular: Major flow voids are preserved. Skull and upper cervical spine: Normal calvarium and skull base. Visualized upper cervical spine and soft tissues are normal. Sinuses/Orbits:No paranasal sinus fluid levels or advanced mucosal thickening. No mastoid or middle ear effusion. Normal orbits. IMPRESSION: 1. No acute intracranial abnormality. 2. Cystic  encephalomalacia in the high right parietal lobe. Electronically Signed   By: Deatra Robinson M.D.   On: 07/03/2020 21:51   MR Cervical Spine Wo Contrast  Result Date: 07/03/2020 CLINICAL DATA:  Found down.  Frequent falls. EXAM: MRI CERVICAL SPINE WITHOUT CONTRAST TECHNIQUE: Multiplanar, multisequence MR imaging of the cervical spine was performed. No intravenous contrast was administered. COMPARISON:  None. FINDINGS: Alignment: Physiologic.  Straightening of normal cervical lordosis. Vertebrae: Modic type 2 endplate signal changes at C4-5. No acute fracture. Cord: Hyperintense T2-weighted signal at the C4-5 levels within the spinal cord. Posterior Fossa, vertebral arteries, paraspinal tissues: Negative. Disc levels: C1-2: Unremarkable. C2-3: Normal disc space and facet joints. There is no spinal canal stenosis. No neural foraminal stenosis. C3-4: Normal disc space and facet joints. There is no spinal canal stenosis. No neural foraminal stenosis. C4-5: Large central disc extrusion with components of superior and inferior migration. There is mass effect on the spinal cord with associated signal change. Severe spinal canal stenosis. Severe right and mild left neural foraminal stenosis. C5-6: Disc desiccation with mild uncovertebral hypertrophy. There is no spinal canal stenosis. No neural foraminal stenosis. C6-7: Normal disc space and facet joints. There is no spinal canal stenosis. No neural foraminal stenosis. C7-T1: Normal disc space and facet joints. There is no spinal canal stenosis. No neural foraminal stenosis. IMPRESSION: 1. 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 2. Severe right C4-5 neural foraminal stenosis. Electronically Signed   By: Deatra Robinson M.D.   On: 07/03/2020 22:02   DG Chest Portable 1 View  Result Date: 07/03/2020 CLINICAL DATA:  Weakness EXAM: PORTABLE CHEST 1 VIEW COMPARISON:   None. FINDINGS: The heart size and mediastinal contours are within normal limits. Both lungs are clear. The visualized skeletal structures are unremarkable. IMPRESSION: No acute abnormality of the lungs in AP portable projection. Electronically Signed   By: Lauralyn Primes M.D.   On: 07/03/2020 14:39               LOS: 1 day   Yvette Phillips  [facility]  Pager on [email or web address]. If 7PM-7AM, please contact night-coverage at [email or web address]     07/04/2020, 11:49 AM

## 2020-07-04 NOTE — Anesthesia Postprocedure Evaluation (Signed)
Anesthesia Post Note  Patient: Wendelyn Breslow  Procedure(s) Performed: ANTERIOR CERVICAL DECOMPRESSION/DISCECTOMY FUSION 1 LEVEL (N/A Spine Cervical)  Patient location during evaluation: PACU Anesthesia Type: General Level of consciousness: awake and alert Pain management: pain level controlled Vital Signs Assessment: post-procedure vital signs reviewed and stable Respiratory status: spontaneous breathing, nonlabored ventilation and respiratory function stable Cardiovascular status: blood pressure returned to baseline and stable Postop Assessment: no apparent nausea or vomiting Anesthetic complications: yes   Encounter Complications  Complication Outcome Phase Comment  Difficult to intubate - expected  Intraprocedure      Last Vitals:  Vitals:   07/04/20 1800 07/04/20 1815  BP: (!) 118/47 119/73  Pulse: 92 88  Resp: (!) 28 20  Temp:    SpO2: 95% 95%    Last Pain:  Vitals:   07/04/20 1815  TempSrc:   PainSc: 5                  Karleen Hampshire

## 2020-07-04 NOTE — Anesthesia Procedure Notes (Signed)
Procedure Name: Intubation Date/Time: 07/04/2020 3:39 PM Performed by: Irving Burton, CRNA Pre-anesthesia Checklist: Patient identified, Emergency Drugs available, Suction available and Patient being monitored Patient Re-evaluated:Patient Re-evaluated prior to induction Oxygen Delivery Method: Circle system utilized Preoxygenation: Pre-oxygenation with 100% oxygen Induction Type: IV induction Ventilation: Mask ventilation without difficulty Laryngoscope Size: Glidescope and 3 Grade View: Grade I Tube type: Oral Tube size: 7.0 mm Number of attempts: 3 Airway Equipment and Method: Bougie stylet,  Video-laryngoscopy and Bite block Placement Confirmation: ETT inserted through vocal cords under direct vision,  positive ETCO2 and breath sounds checked- equal and bilateral Secured at: 21 cm Tube secured with: Tape Dental Injury: Teeth and Oropharynx as per pre-operative assessment  Difficulty Due To: Difficult Airway-  due to neck instability, Difficult Airway- due to reduced neck mobility, Difficulty was anticipated and Difficult Airway- due to dentition Future Recommendations: Recommend- induction with short-acting agent, and alternative techniques readily available

## 2020-07-04 NOTE — Consult Note (Signed)
Referring Physician:  No referring provider defined for this encounter.  Primary Physician:  Patient, No Pcp Per  Chief Complaint:  weakness  History of Present Illness: 07/04/2020 Yvette Phillips is a 60 y.o. female who presents with the chief complaint of weakness and poor balance.  She was seen at [facility] around new years, but not intervened upon.  She reports inability to walk over the past 2 weeks.  She re-presented to the hospital with ongoing issues, and was admitted to the hospital after she was found to have critical cervical stenosis.  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   Review of Systems:  A 10 point review of systems is negative, except for the pertinent positives and negatives detailed in the HPI.  Past Medical History: History reviewed. No pertinent past medical history.  Past Surgical History: History reviewed. No pertinent surgical history.  Allergies: Allergies as of 07/03/2020 - Review Complete 07/03/2020  Allergen Reaction Noted  . Codeine Anaphylaxis 12/30/2017  . Penicillins Anaphylaxis 12/30/2017    Medications:  Current Facility-Administered Medications:  .  acetaminophen (TYLENOL) tablet 650 mg, 650 mg, Oral, Q6H PRN **OR** acetaminophen (TYLENOL) suppository 650 mg, 650 mg, Rectal, Q6H PRN, Shalhoub, Deno Lunger, MD .  folic acid (FOLVITE) tablet 1 mg, 1 mg, Oral, Daily, Shalhoub, Deno Lunger, MD .  lactated ringers infusion, , Intravenous, Continuous, Shalhoub, Deno Lunger, MD, Last Rate: 100 mL/hr at 07/04/20 0125, New Bag at 07/04/20 0125 .  LORazepam (ATIVAN) tablet 1-4 mg, 1-4 mg, Oral, Q1H PRN **OR** LORazepam (ATIVAN) injection 1-4 mg, 1-4 mg, Intravenous, Q1H PRN, Shalhoub, Deno Lunger, MD .  LORazepam (ATIVAN) tablet 0-4 mg, 0-4 mg, Oral, Q6H **FOLLOWED BY** [START ON 07/05/2020] LORazepam (ATIVAN) tablet 0-4 mg, 0-4 mg, Oral, Q12H, Shalhoub, Deno Lunger, MD .  midodrine  (PROAMATINE) tablet 5 mg, 5 mg, Oral, Q8H, Shalhoub, Deno Lunger, MD, 5 mg at 07/04/20 0539 .  multivitamin with minerals tablet 1 tablet, 1 tablet, Oral, Daily, Shalhoub, Deno Lunger, MD .  nicotine (NICODERM CQ - dosed in mg/24 hours) patch 14 mg, 14 mg, Transdermal, Daily, Shalhoub, Deno Lunger, MD .  ondansetron (ZOFRAN) tablet 4 mg, 4 mg, Oral, Q6H PRN **OR** ondansetron (ZOFRAN) injection 4 mg, 4 mg, Intravenous, Q6H PRN, Shalhoub, Deno Lunger, MD .  polyethylene glycol (MIRALAX / GLYCOLAX) packet 17 g, 17 g, Oral, Daily PRN, Marinda Elk, MD, 17 g at 07/04/20 0255 .  thiamine tablet 100 mg, 100 mg, Oral, Daily **OR** thiamine (B-1) injection 100 mg, 100 mg, Intravenous, Daily, Shalhoub, Deno Lunger, MD   Social History: Social History   Tobacco Use  . Smoking status: Current Every Day Smoker  . Smokeless tobacco: Never Used  Substance Use Topics  . Alcohol use: Yes    Comment: occassionally  . Drug use: Not Currently    Family Medical History: Family History  Problem Relation Age of Onset  . Heart disease Neg Hx     Physical Examination: Vitals:   07/04/20 0017 07/04/20 0537  BP: 124/68 121/72  Pulse: 80 83  Resp: 16 16  Temp: 98 F (36.7 C) 98.1 F (36.7 C)  SpO2: 98% 97%     General: Patient is well developed, well nourished, calm, collected, and in no apparent distress.  Psychiatric: Patient is non-anxious.  Head:  Pupils equal, round, and reactive to light.  ENT:  Oral mucosa appears well hydrated.  Neck:   Supple.  Full range of motion.  Respiratory: Patient is breathing without any difficulty.  Extremities: No edema.  Vascular: Palpable pulses in dorsal pedal vessels.  Skin:   On exposed skin, there are no abnormal skin lesions.  NEUROLOGICAL:  General: In no acute distress.   Awake, alert, oriented to person, place, and time.  Pupils equal round and reactive to light.  Facial tone is symmetric.  Tongue protrusion is midline.  There is no pronator  drift.  ROM of spine: diminshed.  Palpation of spine: nontender.    Strength: Side Biceps Triceps Deltoid Interossei Grip Wrist Ext. Wrist Flex.  R 5 5 5 2 1 1 3  L 4+ 4+ 5 2 1 1 3   Side Iliopsoas Quads Hamstring PF DF EHL  R 4 5 5 5 5 5  L 4 4+ 5 5 5 5   Reflexes are 2+ and symmetric at the biceps, triceps, brachioradialis, patella and achilles.   Bilateral upper and lower extremity sensation is intact to light touch, but tingling in her hands is present.  Gait is untested.  Cannot perform tandem gait.  Hoffman's is present.  Imaging: MRI brain C Spine 07/03/20 IMPRESSION: 1. No acute intracranial abnormality. 2. 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  IMPRESSION: 1. 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 2. Severe right C4-5 neural foraminal stenosis.   Electronically Signed   By: Kevin  Herman M.D.   On: 07/03/2020 22:02 I have personally reviewed the images and agree with the above interpretation.  Labs: CBC Latest Ref Rng & Units 07/04/2020 07/03/2020 05/31/2020  WBC 4.0 - 10.5 K/uL 7.5 9.3 6.7  Hemoglobin 12.0 - 15.0 g/dL 13.4 15.5(H) 15.0  Hematocrit 36.0 - 46.0 % 39.7 46.5(H) 44.8  Platelets 150 - 400 K/uL 263 287 338       Assessment and Plan: Ms. Pedigo is a pleasant 59 y.o. 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  My partner and I have both recommended ACDF C4-5.  We will make her NPO and make arrangements.  No conservative management is reasonable or indicated in this situation.  I discussed the planned procedure at length with the patient, including the risks, benefits, alternatives, and indications. The risks discussed include but are not limited to bleeding, infection, need for reoperation, spinal fluid leak, stroke, vision loss, anesthetic  complication, coma, paralysis, and even death. We also discussed the possibility of post-operative dysphagia, vocal cord paralysis, and the risk of adjacent segment disease in the future. I also described in detail that improvement was not guaranteed.  The patient expressed understanding of these risks, and asked that we proceed with surgery. I described the surgery in layman's terms, and gave ample opportunity for questions, which were answered to the best of my ability.   Damen Windsor K. Reyann Troop MD, MPHS Dept. of Neurosurgery    

## 2020-07-04 NOTE — Interval H&P Note (Signed)
History and Physical Interval Note:  07/04/2020 2:50 PM  Yvette Phillips  has presented today for surgery, with the diagnosis of Central cord syndrome.  The various methods of treatment have been discussed with the patient and family. After consideration of risks, benefits and other options for treatment, the patient has consented to  Procedure(s): ANTERIOR CERVICAL DECOMPRESSION/DISCECTOMY FUSION 1 LEVEL (N/A) as a surgical intervention.  The patient's history has been reviewed, patient examined, no change in status, stable for surgery.  I have reviewed the patient's chart and labs.  Questions were answered to the patient's satisfaction.    Heart sounds normal no MRG. Chest Clear to Auscultation Bilaterally.   Dodge Ator

## 2020-07-04 NOTE — Progress Notes (Signed)
PT Cancellation Note  Patient Details Name: Yvette Phillips MRN: [id number] DOB: [birth year]   Cancelled Treatment:    Reason Eval/Treat Not Completed: Patient not medically ready.  PT consult received.  Chart reviewed.  Per pt's nurse and also MD notes, plan for surgical intervention today.  D/t this, will hold PT at this time.  Anticipate will need new PT order (or continue at transfer order) post op in order to initiate PT evaluation.  Hendricks Limes, PT 07/04/20, 12:04 PM

## 2020-07-04 NOTE — H&P (View-Only) (Signed)
Referring Physician:  No referring provider defined for this encounter.  Primary Physician:  Patient, No Pcp Per  Chief Complaint:  weakness  History of Present Illness: 07/04/2020 Yvette Phillips is a 60 y.o. female who presents with the chief complaint of weakness and poor balance.  She was seen at [facility] around new years, but not intervened upon.  She reports inability to walk over the past 2 weeks.  She re-presented to the hospital with ongoing issues, and was admitted to the hospital after she was found to have critical cervical stenosis.  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   Review of Systems:  A 10 point review of systems is negative, except for the pertinent positives and negatives detailed in the HPI.  Past Medical History: History reviewed. No pertinent past medical history.  Past Surgical History: History reviewed. No pertinent surgical history.  Allergies: Allergies as of 07/03/2020 - Review Complete 07/03/2020  Allergen Reaction Noted  . Codeine Anaphylaxis 12/30/2017  . Penicillins Anaphylaxis 12/30/2017    Medications:  Current Facility-Administered Medications:  .  acetaminophen (TYLENOL) tablet 650 mg, 650 mg, Oral, Q6H PRN **OR** acetaminophen (TYLENOL) suppository 650 mg, 650 mg, Rectal, Q6H PRN, Shalhoub, Deno Lunger, MD .  folic acid (FOLVITE) tablet 1 mg, 1 mg, Oral, Daily, Shalhoub, Deno Lunger, MD .  lactated ringers infusion, , Intravenous, Continuous, Shalhoub, Deno Lunger, MD, Last Rate: 100 mL/hr at 07/04/20 0125, New Bag at 07/04/20 0125 .  LORazepam (ATIVAN) tablet 1-4 mg, 1-4 mg, Oral, Q1H PRN **OR** LORazepam (ATIVAN) injection 1-4 mg, 1-4 mg, Intravenous, Q1H PRN, Shalhoub, Deno Lunger, MD .  LORazepam (ATIVAN) tablet 0-4 mg, 0-4 mg, Oral, Q6H **FOLLOWED BY** [START ON 07/05/2020] LORazepam (ATIVAN) tablet 0-4 mg, 0-4 mg, Oral, Q12H, Shalhoub, Deno Lunger, MD .  midodrine  (PROAMATINE) tablet 5 mg, 5 mg, Oral, Q8H, Shalhoub, Deno Lunger, MD, 5 mg at 07/04/20 0539 .  multivitamin with minerals tablet 1 tablet, 1 tablet, Oral, Daily, Shalhoub, Deno Lunger, MD .  nicotine (NICODERM CQ - dosed in mg/24 hours) patch 14 mg, 14 mg, Transdermal, Daily, Shalhoub, Deno Lunger, MD .  ondansetron (ZOFRAN) tablet 4 mg, 4 mg, Oral, Q6H PRN **OR** ondansetron (ZOFRAN) injection 4 mg, 4 mg, Intravenous, Q6H PRN, Shalhoub, Deno Lunger, MD .  polyethylene glycol (MIRALAX / GLYCOLAX) packet 17 g, 17 g, Oral, Daily PRN, Marinda Elk, MD, 17 g at 07/04/20 0255 .  thiamine tablet 100 mg, 100 mg, Oral, Daily **OR** thiamine (B-1) injection 100 mg, 100 mg, Intravenous, Daily, Shalhoub, Deno Lunger, MD   Social History: Social History   Tobacco Use  . Smoking status: Current Every Day Smoker  . Smokeless tobacco: Never Used  Substance Use Topics  . Alcohol use: Yes    Comment: occassionally  . Drug use: Not Currently    Family Medical History: Family History  Problem Relation Age of Onset  . Heart disease Neg Hx     Physical Examination: Vitals:   07/04/20 0017 07/04/20 0537  BP: 124/68 121/72  Pulse: 80 83  Resp: 16 16  Temp: 98 F (36.7 C) 98.1 F (36.7 C)  SpO2: 98% 97%     General: Patient is well developed, well nourished, calm, collected, and in no apparent distress.  Psychiatric: Patient is non-anxious.  Head:  Pupils equal, round, and reactive to light.  ENT:  Oral mucosa appears well hydrated.  Neck:   Supple.  Full range of motion.  Respiratory: Patient is breathing without any difficulty.  Extremities: No edema.  Vascular: Palpable pulses in dorsal pedal vessels.  Skin:   On exposed skin, there are no abnormal skin lesions.  NEUROLOGICAL:  General: In no acute distress.   Awake, alert, oriented to person, place, and time.  Pupils equal round and reactive to light.  Facial tone is symmetric.  Tongue protrusion is midline.  There is no pronator  drift.  ROM of spine: diminshed.  Palpation of spine: nontender.    Strength: Side Biceps Triceps Deltoid Interossei Grip Wrist Ext. Wrist Flex.  R 5 5 5 2 1 1 3   L 4+ 4+ 5 2 1 1 3    Side Iliopsoas Quads Hamstring PF DF EHL  R 4 5 5 5 5 5   L 4 4+ 5 5 5 5    Reflexes are 2+ and symmetric at the biceps, triceps, brachioradialis, patella and achilles.   Bilateral upper and lower extremity sensation is intact to light touch, but tingling in her hands is present.  Gait is untested.  Cannot perform tandem gait.  Hoffman's is present.  Imaging: MRI brain C Spine 07/03/20 IMPRESSION: 1. No acute intracranial abnormality. 2. 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  IMPRESSION: 1. 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 2. Severe right C4-5 neural foraminal stenosis.   Electronically Signed   By: M.D.   On: 07/03/2020 22:02 I have personally reviewed the images and agree with the above interpretation.  Labs: CBC Latest Ref Rng & Units 07/04/2020 07/03/2020 05/31/2020  WBC 4.0 - 10.5 K/uL 7.5 9.3 6.7  Hemoglobin 12.0 - 15.0 g/dL 08/31/2020 15.5(H) 15.0  Hematocrit 36.0 - 46.0 % 39.7 46.5(H) 44.8  Platelets 150 - 400 K/uL 263 287 338       Assessment and Plan: Ms. Kawahara is a pleasant 60 y.o. 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  My partner and I have both recommended ACDF C4-5.  We will make her NPO and make arrangements.  No conservative management is reasonable or indicated in this situation.  I discussed the planned procedure at length with the patient, including the risks, benefits, alternatives, and indications. The risks discussed include but are not limited to bleeding, infection, need for reoperation, spinal fluid leak, stroke, vision loss, anesthetic  complication, coma, paralysis, and even death. We also discussed the possibility of post-operative dysphagia, vocal cord paralysis, and the risk of adjacent segment disease in the future. I also described in detail that improvement was not guaranteed.  The patient expressed understanding of these risks, and asked that we proceed with surgery. I described the surgery in layman's terms, and gave ample opportunity for questions, which were answered to the best of my ability.   Terrelle Ruffolo K. 07/29/2020 MD, MPHS Dept. of Neurosurgery

## 2020-07-04 NOTE — ED Notes (Signed)
Patient aware charge RN arranging transport to the floor. Denies other needs.

## 2020-07-04 NOTE — H&P (Addendum)
History and Physical    Yvette Phillips DOB: 12-Apr-1961 DOA: 07/03/2020  PCP: Patient, No Pcp Per  Patient coming from: Home   Chief Complaint:  Chief Complaint  Patient presents with  . Extremity Weakness     HPI:    Past medical history of nicotine dependence, polysubstance abuse, alcohol abuse who presents to [facility] via EMS after being found down in her apartment.  Patient explains that on Christmas eve she fell in her apartment and since then she suddenly experienced severe weakness of her both arms and both legs.  At that time, patient eventually brought by EMS to [facility] on 12/27 and was hospitalized until 1/1.  During that hospitalization patient was diagnosed with central cord syndrome as well as an incidental diagnosis of mild COVID-19.  Patient was discharged home on 1/1 with outpatient neurosurgery follow-up for outpatient surgical intervention.  Since that time, patient has experienced progressively worsening weakness of the arms and legs.  Patient explains that she is basically "rolling around" her apartment to get around.  She has been inconsistently receiving her meals from her significant other.  After once again being unable to get up off the ground for several days, patient was once again found down after a well check and was brought into [facility] emergency department for evaluation  Upon further questioning patient denies significant pain.  Patient denies consistent fecal or urinary incontinence.  Patient denies fever or sick contacts.  Upon evaluation at [facility] emergency department, case was discussed with Dr. Adriana Simas with neurosurgery considering recent diagnosis of central cord syndrome.  Dr. Adriana Simas recommended hospitalization on the medicine service for optimization of blood pressures with target maps of 80.  He stated that he intends to take the patient to the operating room likely on Friday.  MRI cervical spine was performed revealing large central disc  protrusion at C4-C5 with severe spinal canal stenosis and compressive myelopathy.  The hospitalist group was then called to assess patient for admission to the hospital.     Review of Systems:   Review of Systems  Constitutional: Positive for malaise/fatigue.  Neurological: Positive for weakness.  All other systems reviewed and are negative.   History reviewed. No pertinent past medical history.  History reviewed. No pertinent surgical history.   reports that she has been smoking. She has never used smokeless tobacco. She reports current alcohol use. She reports previous drug use.  Allergies  Allergen Reactions  . Codeine Anaphylaxis  . Penicillins Anaphylaxis    Family History  Problem Relation Age of Onset  . Heart disease Neg Hx      Prior to Admission medications   Not on File    Physical Exam: Vitals:   07/03/20 2030 07/03/20 2355 07/04/20 0000 07/04/20 0017  BP: 117/70 110/63 110/62 124/68  Pulse: 95 88 81 80  Resp: 15  14 16   Temp:   98.4 F (36.9 C) 98 F (36.7 C)  TempSrc:   Oral   SpO2: 96%  100% 98%  Weight:      Height:        Constitutional: Acute alert and oriented x3, no associated distress.   Skin: no rashes, no lesions, good skin turgor noted. Eyes: Pupils are equally reactive to light.  No evidence of scleral icterus or conjunctival pallor.  ENMT: Moist mucous membranes noted.  Posterior pharynx clear of any exudate or lesions.   Neck: normal, supple, no masses, no thyromegaly.  No evidence of jugular venous distension.  Respiratory: clear to auscultation bilaterally, no wheezing, no crackles. Normal respiratory effort. No accessory muscle use.  Cardiovascular: Regular rate and rhythm, no murmurs / rubs / gallops. No extremity edema. 2+ pedal pulses. No carotid bruits.  Chest:   Nontender without crepitus or deformity.   Back:   Nontender without crepitus or deformity. Abdomen: Abdomen is soft and nontender.  No evidence of intra-abdominal  masses.  Positive bowel sounds noted in all quadrants.   Musculoskeletal: No joint deformity upper and lower extremities. Good ROM, no contractures. Normal muscle tone.  Neurologic: Significant weakness of both proximal and distal muscle groups of the bilateral upper and lower extremities.  Sensation is grossly intact.  CN 2-12 grossly intact. Sensation intact. Patient is following all commands.  Patient is responsive to verbal stimuli.   Psychiatric: Patient exhibits depressed mood with labile affect.  Patient seems to possess insight as to their current situation.     Labs on Admission: I have personally reviewed following labs and imaging studies -   CBC: Recent Labs  Lab 07/03/20 1225  WBC 9.3  NEUTROABS 6.1  HGB 15.5*  HCT 46.5*  MCV 106.7*  PLT 287   Basic Metabolic Panel: Recent Labs  Lab 07/03/20 1225  NA 136  K 4.1  CL 95*  CO2 24  GLUCOSE 75  BUN 13  CREATININE 1.01*  CALCIUM 9.9   GFR: Estimated Creatinine Clearance: 72.7 mL/min (A) (by C-G formula based on SCr of 1.01 mg/dL (H)). Liver Function Tests: No results for input(s): AST, ALT, ALKPHOS, BILITOT, PROT, ALBUMIN in the last 168 hours. No results for input(s): LIPASE, AMYLASE in the last 168 hours. No results for input(s): AMMONIA in the last 168 hours. Coagulation Profile: No results for input(s): INR, PROTIME in the last 168 hours. Cardiac Enzymes: Recent Labs  Lab 07/03/20 1347  CKTOTAL 375*   BNP (last 3 results) No results for input(s): PROBNP in the last 8760 hours. HbA1C: No results for input(s): HGBA1C in the last 72 hours. CBG: No results for input(s): GLUCAP in the last 168 hours. Lipid Profile: No results for input(s): CHOL, HDL, LDLCALC, TRIG, CHOLHDL, LDLDIRECT in the last 72 hours. Thyroid Function Tests: No results for input(s): TSH, T4TOTAL, FREET4, T3FREE, THYROIDAB in the last 72 hours. Anemia Panel: No results for input(s): VITAMINB12, FOLATE, FERRITIN, TIBC, IRON, RETICCTPCT  in the last 72 hours. Urine analysis:    Component Value Date/Time   COLORURINE AMBER (A) 07/03/2020 1649   APPEARANCEUR HAZY (A) 07/03/2020 1649   LABSPEC 1.028 07/03/2020 1649   PHURINE 5.0 07/03/2020 1649   GLUCOSEU NEGATIVE 07/03/2020 1649   HGBUR MODERATE (A) 07/03/2020 1649   BILIRUBINUR SMALL (A) 07/03/2020 1649   KETONESUR 80 (A) 07/03/2020 1649   PROTEINUR NEGATIVE 07/03/2020 1649   NITRITE NEGATIVE 07/03/2020 1649   LEUKOCYTESUR MODERATE (A) 07/03/2020 1649    Radiological Exams on Admission - Personally Reviewed: DG Knee 2 Views Left  Result Date: 07/03/2020 CLINICAL DATA:  Left knee pain EXAM: LEFT KNEE - 1-2 VIEW COMPARISON:  05/31/2020 FINDINGS: Frontal and cross-table lateral views of the left knee are obtained. No acute fracture, subluxation, or dislocation. Stable 3 compartmental osteoarthritis greatest in the medial compartment. No joint effusion. Soft tissues are unremarkable. IMPRESSION: 1. Stable osteoarthritis.  No acute fracture. Electronically Signed   By: Sharlet Salina M.D.   On: 07/03/2020 18:23   MR BRAIN WO CONTRAST  Result Date: 07/03/2020 CLINICAL DATA:  Found down EXAM: MRI HEAD WITHOUT CONTRAST TECHNIQUE: Multiplanar,  multiecho pulse sequences of the brain and surrounding structures were obtained without intravenous contrast. COMPARISON:  None. FINDINGS: Brain: No acute infarct, mass effect or extra-axial collection. No acute or chronic hemorrhage. 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 The midline structures are normal. Vascular: Major flow voids are preserved. Skull and upper cervical spine: Normal calvarium and skull base. Visualized upper cervical spine and soft tissues are normal. Sinuses/Orbits:No paranasal sinus fluid levels or advanced mucosal thickening. No mastoid or middle ear effusion. Normal orbits. IMPRESSION: 1. No acute intracranial abnormality. 2. 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 Electronically Signed    By: Deatra Robinson M.D.   On: 07/03/2020 21:51   MR Cervical Spine Wo Contrast  Result Date: 07/03/2020 CLINICAL DATA:  Found down.  Frequent falls. EXAM: MRI CERVICAL SPINE WITHOUT CONTRAST TECHNIQUE: Multiplanar, multisequence MR imaging of the cervical spine was performed. No intravenous contrast was administered. COMPARISON:  None. FINDINGS: Alignment: Physiologic.  Straightening of normal cervical lordosis. Vertebrae: Modic type 2 endplate signal changes at C4-5. No acute fracture. Cord: Hyperintense T2-weighted signal at the C4-5 levels within the spinal cord. Posterior Fossa, vertebral arteries, paraspinal tissues: Negative. Disc levels: C1-2: Unremarkable. C2-3: Normal disc space and facet joints. There is no spinal canal stenosis. No neural foraminal stenosis. C3-4: Normal disc space and facet joints. There is no spinal canal stenosis. No neural foraminal stenosis. C4-5: Large central disc extrusion with components of superior and inferior migration. There is mass effect on the spinal cord with associated signal change. Severe spinal canal stenosis. Severe right and mild left neural foraminal stenosis. C5-6: Disc desiccation with mild uncovertebral hypertrophy. There is no spinal canal stenosis. No neural foraminal stenosis. C6-7: Normal disc space and facet joints. There is no spinal canal stenosis. No neural foraminal stenosis. C7-T1: Normal disc space and facet joints. There is no spinal canal stenosis. No neural foraminal stenosis. IMPRESSION: 1. 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 2. Severe right C4-5 neural foraminal stenosis. Electronically Signed   By: Deatra Robinson M.D.   On: 07/03/2020 22:02   DG Chest Portable 1 View  Result Date: 07/03/2020 CLINICAL DATA:  Weakness EXAM: PORTABLE CHEST 1 VIEW COMPARISON:  None. FINDINGS: The heart size and mediastinal contours are within normal  limits. Both lungs are clear. The visualized skeletal structures are unremarkable. IMPRESSION: No acute abnormality of the lungs in AP portable projection. Electronically Signed   By: Lauralyn Primes M.D.   On: 07/03/2020 14:39    Assessment/Plan Principal Problem:   Central cord syndrome [facility])   Recent diagnosis of central cord syndrome during hospitalization at [facility] from 12/27 until 1/1.  Patient was originally discharged from there for outpatient neurosurgery follow-up and intervention however patient has been experiencing progressively worsening weakness and difficulty with performing activities of daily living  MRI performed here reveals severe spinal canal stenosis at C4-C5 due to large central disc extrusion.  Patient is already been evaluated by neurosurgery (Dr. Adriana Simas) who plans on taking the patient to the OR for intervention tentatively on Friday.  PT evaluation has been ordered per neurosurgery recommendations  Per neurosurgery recommendations, hydrating patient with intravenous fluids attempting to achieve target map of 80.  If unsuccessful will additionally start low-dose midodrine   Active Problems:   Alcohol abuse   Patient has longstanding known history of heavy alcohol use although she states that she has recently cut back.  Regardless, CIWA protocol has been initiated    Polysubstance abuse [facility])   Once patient is clinically improved will counsel patient daily on cessation of cocaine and marijuana which were both positive during last urine toxicology screen.    Nicotine dependence, cigarettes, uncomplicated   Counseled patient on cessation daily  Providing patient with nicotine replacement therapy    Code Status:  Full code Family Communication: deferred   Status is: Inpatient  Not inpatient appropriate, will call UM team and downgrade to OBS.   Dispo: The patient is from: Home              Anticipated d/c is to: SNF              Anticipated  d/c date is: > 3 days              Patient currently is not medically stable to d/c.   Difficult to place patient No        Yvette Elk MD [facility] Pager [phone]  If 7PM-7AM, please contact night-coverage [email or web address] Use universal [place] password for that web site. If you do not have the password, please call the hospital operator.  07/04/2020, 2:30 AM

## 2020-07-04 NOTE — Transfer of Care (Signed)
Immediate Anesthesia Transfer of Care Note  Patient: Yvette Phillips  Procedure(s) Performed: ANTERIOR CERVICAL DECOMPRESSION/DISCECTOMY FUSION 1 LEVEL (N/A Spine Cervical)  Patient Location: PACU  Anesthesia Type:General  Level of Consciousness: awake, alert  and oriented  Airway & Oxygen Therapy: Patient connected to face mask oxygen  Post-op Assessment: Post -op Vital signs reviewed and stable  Post vital signs: stable  Last Vitals:  Vitals Value Taken Time  BP 125/71 07/04/20 1748  Temp    Pulse 94 07/04/20 1752  Resp 22 07/04/20 1752  SpO2 90 % 07/04/20 1752  Vitals shown include unvalidated device data.  Last Pain:  Vitals:   07/04/20 1501  TempSrc:   PainSc: 4          Complications:  Encounter Complications  Complication Outcome Phase Comment  Difficult to intubate - expected  Intraprocedure

## 2020-07-05 ENCOUNTER — Inpatient Hospital Stay: Payer: Medicaid Other

## 2020-07-05 ENCOUNTER — Encounter: Payer: Self-pay | Admitting: Neurosurgery

## 2020-07-05 DIAGNOSIS — L8990 Pressure ulcer of unspecified site, unspecified stage: Secondary | ICD-10-CM | POA: Insufficient documentation

## 2020-07-05 MED ORDER — ALUM & MAG HYDROXIDE-SIMETH 200-200-20 MG/5ML PO SUSP
30.0000 mL | Freq: Four times a day (QID) | ORAL | Status: DC | PRN
  Administered 2020-07-05 – 2020-07-07 (×2): 30 mL via ORAL
  Filled 2020-07-05 (×2): qty 30

## 2020-07-05 MED ORDER — MAGNESIUM SULFATE 2 GM/50ML IV SOLN
2.0000 g | Freq: Once | INTRAVENOUS | Status: AC
  Administered 2020-07-05: 2 g via INTRAVENOUS
  Filled 2020-07-05: qty 50

## 2020-07-05 MED ORDER — ENOXAPARIN SODIUM 40 MG/0.4ML ~~LOC~~ SOLN: 40.0000 mg | SUBCUTANEOUS | Status: DC

## 2020-07-05 MED ORDER — FAMOTIDINE 20 MG PO TABS
20.0000 mg | ORAL_TABLET | Freq: Every day | ORAL | Status: DC
  Administered 2020-07-05 – 2020-07-10 (×6): 20 mg via ORAL
  Filled 2020-07-05 (×6): qty 1

## 2020-07-05 MED ORDER — POTASSIUM CHLORIDE CRYS ER 20 MEQ PO TBCR
40.0000 meq | EXTENDED_RELEASE_TABLET | Freq: Once | ORAL | Status: AC
  Administered 2020-07-05: 40 meq via ORAL
  Filled 2020-07-05: qty 2

## 2020-07-05 MED ORDER — BACLOFEN 10 MG PO TABS
10.0000 mg | ORAL_TABLET | Freq: Three times a day (TID) | ORAL | Status: DC | PRN
  Administered 2020-07-05 – 2020-07-06 (×2): 10 mg via ORAL
  Filled 2020-07-05 (×3): qty 1

## 2020-07-05 MED ORDER — DOCUSATE SODIUM 100 MG PO CAPS
100.0000 mg | ORAL_CAPSULE | Freq: Two times a day (BID) | ORAL | Status: DC
  Administered 2020-07-05 – 2020-07-10 (×7): 100 mg via ORAL
  Filled 2020-07-05 (×11): qty 1

## 2020-07-05 MED ORDER — ENOXAPARIN SODIUM 60 MG/0.6ML ~~LOC~~ SOLN
0.5000 mg/kg | SUBCUTANEOUS | Status: DC
  Administered 2020-07-05 – 2020-07-11 (×7): 52.5 mg via SUBCUTANEOUS
  Filled 2020-07-05 (×7): qty 0.6

## 2020-07-05 MED ORDER — POLYETHYLENE GLYCOL 3350 17 G PO PACK
17.0000 g | PACK | Freq: Two times a day (BID) | ORAL | Status: DC
  Administered 2020-07-05 – 2020-07-10 (×6): 17 g via ORAL
  Filled 2020-07-05 (×11): qty 1

## 2020-07-05 NOTE — Evaluation (Signed)
Physical Therapy Evaluation Patient Details Name: Yvette Phillips MRN: [id number] DOB: [birth year] Today's Date: 07/05/2020   History of Present Illness  Pt is a 60 y.o. female presenting to hospital 07/03/20.  Per MD H&P: "Patient explains that on Christmas eve she fell in her apartment and since then she suddenly experienced severe weakness of her both arms and both legs.  At that time, patient eventually brought by EMS to [facility] on 12/27 and was hospitalized until 1/1.  During that hospitalization patient was diagnosed with central cord syndrome as well as an incidental diagnosis of mild COVID-19.  Patient was discharged home on 1/1 with outpatient neurosurgery follow-up for outpatient surgical intervention.  Since that time, patient has experienced progressively worsening weakness of the arms and legs.  Patient explains that she is basically "rolling around" her apartment to get around.  She has been inconsistently receiving her meals from her significant other.  After once again being unable to get up off the ground for several days, patient was once again found down after a well check and was brought into [facility] emergency department for evaluation".  Imaging negative for acute fx L knee.  MRI brain negative for acute intracranial abnormality.  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  Pt s/p ACDF C4-5 07/04/20.  Per chart, no pertinent PMH.  Clinical Impression  Prior to fall Christmas Eve 2021 pt was independent with functional mobility; working at group home.  Pt has had increasing difficulty with mobility since then (most recently requiring assist to get OOB and to a chair from friend that lives down the street).  Pt lives alone in 1 level home with 4-5 STE B railings.  Pt noted with B UE and LE weakness (L sided weakness  greater than R sided weakness; decreased B hand grip strength--L weaker than R).  Currently pt is mod to max assist with bed mobility and mod assist x2 to stand up to RW (x2 trials); vc's required for shifting hips forward and shoulders back for upright standing balance.  Pt unable to take any steps with RW use and 2 assist and max cueing (pt's B LE's bracing against bed for support in standing).  Pt assisted back to bed and boosted up in bed using bed sheet with 2 assist end of session and repositioned for comfort.  Pt reporting no neck pain throughout PT session.  Pt pleasant and appearing very motivated to participate in therapy.  Pt would benefit from skilled PT to address noted impairments and functional limitations (see below for any additional details).  Upon hospital discharge, pt would benefit from CIR.    Follow Up Recommendations CIR    Equipment Recommendations   (TBD at next facility)    Recommendations for Other Services OT consult     Precautions / Restrictions Precautions Precautions: Fall;Cervical Precaution Comments: s/p ACDF C4-5 (per secure messaging with MD Myer Haff 07/05/20--no cervical brace needed) Restrictions Weight Bearing Restrictions: No      Mobility  Bed Mobility Overal bed mobility: Needs Assistance Bed Mobility: Rolling;Sidelying to Sit;Sit to Sidelying;Sit to Supine Rolling: Mod assist Sidelying to sit: Max assist (assist for trunk and B LE's)   Sit to supine: Mod assist (R sidelying to supine assist for LE's) Sit to sidelying: Max assist (assist for trunk and B LE's) General bed mobility comments: vc's and tactile cues  for technique and spinal precautions; increased time to perform    Transfers Overall transfer level: Needs assistance Equipment used: Rolling walker (2 wheeled) Transfers: Sit to/from Stand Sit to Stand: Mod assist;+2 physical assistance         General transfer comment: vc's and tactile cues for UE/LE placement; assist to  initiate and come to full stand and control descent sitting  Ambulation/Gait             General Gait Details: pt unable to take any steps with RW use and 2 assist and max cueing (pt's LE's bracing against bed for stability/balance)  Stairs            Wheelchair Mobility    Modified Rankin (Stroke Patients Only)       Balance Overall balance assessment: Needs assistance Sitting-balance support: No upper extremity supported;Feet supported Sitting balance-Leahy Scale: Fair Sitting balance - Comments: steady static sitting (once positioned safely in sitting) but requiring at least single UE support for reaching within BOS   Standing balance support: Bilateral upper extremity supported Standing balance-Leahy Scale: Poor Standing balance comment: pt bracing B LE's against bed to maintain standing with 2 assist and RW use                             Pertinent Vitals/Pain Pain Assessment: No/denies pain  Vitals (HR and O2 on room air) stable and WFL throughout treatment session.    Home Living Family/patient expects to be discharged to:: Private residence Living Arrangements: Alone   Type of Home: Apartment Home Access: Stairs to enter Entrance Stairs-Rails: Lawyer of Steps: 4-5 Home Layout: One level Home Equipment: Walker - 2 wheels;Walker - 4 wheels Additional Comments: Pt reports living in studio apt/duplex.    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  Since then pt has been using RW but most recently pt's friend (who lives down street) has been assisting her OOB and to get into chair (hasn't been able to walk with RW).     Hand Dominance        Extremity/Trunk Assessment   Upper Extremity Assessment Upper  Extremity Assessment: RUE deficits/detail;LUE deficits/detail RUE Deficits / Details: decreased strength B finger flexion/extension (although stronger than L hand); 4/5 R elbow flexion/extension; R shoulder flexion AROM at least 90 degrees RUE Coordination: decreased fine motor;decreased gross motor LUE Deficits / Details: decreased strength B finger flexion/extension with decreased AROM; at least 3/5 L elbow flexion/extension; L shoulder flexion to grossly 60 degrees AROM LUE Coordination: decreased fine motor;decreased gross motor    Lower Extremity Assessment Lower Extremity Assessment: RLE deficits/detail;LLE deficits/detail (R>L isometric quad set strength; increased tone L LE compared to R LE) RLE Deficits / Details: hip flexion 3-/5; knee extension 3/5; knee flexion at least 3/5; DF at least 3/5 AROM (limited DF ROM to grossly 10 degrees short of neutral) RLE Coordination: decreased fine motor;decreased gross motor LLE Deficits / Details: hip flexion 2-/5; knee extension 2-/5; knee flexion 2-/5; DF 2-/5 (limited DF ROM to grossly 20 degrees short of neutral AAROM) LLE Coordination: decreased fine motor;decreased gross motor    Cervical / Trunk Assessment Cervical / Trunk Assessment: Other exceptions Cervical / Trunk Exceptions: forward shoulders  Communication   Communication: No difficulties  Cognition Arousal/Alertness: Awake/alert Behavior During Therapy: WFL for tasks assessed/performed Overall Cognitive Status: Within Functional Limits for tasks assessed                                        General Comments  Continue at transfer PT order received post surgery.  Nursing cleared pt for participation in physical therapy.  Pt agreeable to PT session.    Exercises  Transfer training   Assessment/Plan    PT Assessment Patient needs continued PT services  PT Problem List Decreased strength;Decreased range of motion;Decreased balance;Decreased  mobility;Decreased coordination;Decreased knowledge of use of DME;Decreased knowledge of precautions;Pain;Decreased skin integrity;Impaired tone       PT Treatment Interventions DME instruction;Gait training;Stair training;Functional mobility training;Therapeutic activities;Therapeutic exercise;Balance training;Patient/family education    PT Goals (Current goals can be found in the Care Plan section)  Acute Rehab PT Goals Patient Stated Goal: to improve strength and mobility PT Goal Formulation: With patient Time For Goal Achievement: 07/19/20 Potential to Achieve Goals: Good    Frequency BID   Barriers to discharge Decreased caregiver support      Co-evaluation               AM-PAC PT "6 Clicks" Mobility  Outcome Measure Help needed turning from your back to your side while in a flat bed without using bedrails?: A Lot Help needed moving from lying on your back to sitting on the side of a flat bed without using bedrails?: A Lot Help needed moving to and from a bed to a chair (including a wheelchair)?: Total Help needed standing up from a chair using your arms (e.g., wheelchair or bedside chair)?: Total Help needed to walk in hospital room?: Total Help needed climbing 3-5 steps with a railing? : Total 6 Click Score: 8    End of Session Equipment Utilized During Treatment: Gait belt Activity Tolerance: Patient tolerated treatment well Patient left: in bed;with call bell/phone within reach;with bed alarm set;Other (comment) (B heels floating via pillow support) Nurse Communication: Mobility status;Precautions PT Visit Diagnosis: Other abnormalities of gait and mobility (R26.89);Unsteadiness on feet (R26.81);Muscle weakness (generalized) (M62.81);History of falling (Z91.81);Difficulty in walking, not elsewhere classified (R26.2)    Time: 9622-2979 PT Time Calculation (min) (ACUTE ONLY): 45 min   Charges:   PT Evaluation $PT Eval Moderate Complexity: 1 Mod PT  Treatments $Therapeutic Activity: 23-37 mins       Hendricks Limes, PT 07/05/20, 12:16 PM

## 2020-07-05 NOTE — Progress Notes (Addendum)
[facility]  - [place] [facility]   PATIENT NAME: Yvette Phillips    MR#:  [id number]  DATE OF BIRTH:  1960-08-01  SUBJECTIVE:   Working with physical therapy. Denies much pain. Overall slowly improving. Sister in the room. REVIEW OF SYSTEMS:   Review of Systems  Constitutional: Negative for chills, fever and weight loss.  HENT: Negative for ear discharge, ear pain and nosebleeds.   Eyes: Negative for blurred vision, pain and discharge.  Respiratory: Negative for sputum production, shortness of breath, wheezing and stridor.   Cardiovascular: Negative for chest pain, palpitations, orthopnea and PND.  Gastrointestinal: Negative for abdominal pain, diarrhea, nausea and vomiting.  Genitourinary: Negative for frequency and urgency.  Musculoskeletal: Positive for joint pain and neck pain. Negative for back pain.  Neurological: Positive for weakness. Negative for sensory change, speech change and focal weakness.  Psychiatric/Behavioral: Negative for depression and hallucinations. The patient is not nervous/anxious.    Tolerating Diet:yesTolerating PT: CIR  DRUG ALLERGIES:   Allergies  Allergen Reactions  . Codeine Anaphylaxis  . Penicillins Anaphylaxis    VITALS:  Blood pressure (!) 128/116, pulse 97, temperature 98.6 F (37 C), resp. rate 18, height 5\' 7"  (1.702 m), weight 104.3 kg, SpO2 100 %.  PHYSICAL EXAMINATION:   Physical Exam  GENERAL:  60 y.o.-year-old patient lying in the bed with no acute distress.  HEENT: Head atraumatic, normocephalic. Oropharynx and nasopharynx clear.  NECK:  Supple, no jugular venous distention. No thyroid enlargement, no tenderness.  LUNGS: Normal breath sounds bilaterally, no wheezing, rales, rhonchi. No use of accessory muscles of respiration.  CARDIOVASCULAR: S1, S2 normal. No murmurs, rubs, or gallops.  ABDOMEN: Soft, nontender, nondistended. Bowel sounds present. No organomegaly or mass.  EXTREMITIES: No cyanosis, clubbing  or edema b/l.    NEUROLOGIC: Cranial nerves II through XII are intact. 4/5 weakness LE PSYCHIATRIC:  patient is alert and oriented x 3.  SKIN:  Pressure Injury 07/04/20 Buttocks Left Stage 2 -  Partial thickness loss of dermis presenting as a shallow open injury with a red, pink wound bed without slough. (Active)  07/04/20 0030  Location: Buttocks  Location Orientation: Left  Staging: Stage 2 -  Partial thickness loss of dermis presenting as a shallow open injury with a red, pink wound bed without slough.  Wound Description (Comments):   Present on Admission: Yes        LABORATORY PANEL:  CBC Recent Labs  Lab 07/04/20 0519  WBC 7.5  HGB 13.4  HCT 39.7  PLT 263    Chemistries  Recent Labs  Lab 07/04/20 0519  NA 137  K 3.4*  CL 100  CO2 26  GLUCOSE 83  BUN 11  CREATININE 0.83  CALCIUM 9.2  MG 1.5*  AST 22  ALT 13  ALKPHOS 53  BILITOT 1.3*   Cardiac Enzymes No results for input(s): TROPONINI in the last 168 hours. RADIOLOGY:  DG Cervical Spine 2-3 Views  Result Date: 07/04/2020 CLINICAL DATA:  Surgery, elective. Provided fluoroscopy time: 3 seconds. EXAM: CERVICAL SPINE - 2-3 VIEW; DG C-ARM 1-60 MIN COMPARISON:  Cervical spine MRI 07/03/2020. FINDINGS: PA and lateral view intraoperative fluoroscopic images of the cervical spine are submitted, 3 images total. On the final images, ACDF hardware is present at the C4-C5 level. No unexpected finding. Partially visualized ET tube. A surgical sponge/packing material projects ventral to the ACDF hardware on the image taken at 5:21 p.m., but this is no longer present at time of the PA  fluoroscopic image taken at 5:22 p.m. IMPRESSION: Three intraoperative fluoroscopic images of the cervical spine from C4-C5 ACDF, as described. Electronically Signed   By: Jackey Loge DO   On: 07/04/2020 17:54   DG Knee 2 Views Left  Result Date: 07/03/2020 CLINICAL DATA:  Left knee pain EXAM: LEFT KNEE - 1-2 VIEW COMPARISON:  05/31/2020  FINDINGS: Frontal and cross-table lateral views of the left knee are obtained. No acute fracture, subluxation, or dislocation. Stable 3 compartmental osteoarthritis greatest in the medial compartment. No joint effusion. Soft tissues are unremarkable. IMPRESSION: 1. Stable osteoarthritis.  No acute fracture. Electronically Signed   By: Sharlet Salina M.D.   On: 07/03/2020 18:23   MR BRAIN WO CONTRAST  Result Date: 07/03/2020 CLINICAL DATA:  Found down EXAM: MRI HEAD WITHOUT CONTRAST TECHNIQUE: Multiplanar, multiecho pulse sequences of the brain and surrounding structures were obtained without intravenous contrast. COMPARISON:  None. FINDINGS: Brain: No acute infarct, mass effect or extra-axial collection. No acute or chronic hemorrhage. 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 The midline structures are normal. Vascular: Major flow voids are preserved. Skull and upper cervical spine: Normal calvarium and skull base. Visualized upper cervical spine and soft tissues are normal. Sinuses/Orbits:No paranasal sinus fluid levels or advanced mucosal thickening. No mastoid or middle ear effusion. Normal orbits. IMPRESSION: 1. No acute intracranial abnormality. 2. 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 Electronically Signed   By: Deatra Robinson M.D.   On: 07/03/2020 21:51   MR Cervical Spine Wo Contrast  Result Date: 07/03/2020 CLINICAL DATA:  Found down.  Frequent falls. EXAM: MRI CERVICAL SPINE WITHOUT CONTRAST TECHNIQUE: Multiplanar, multisequence MR imaging of the cervical spine was performed. No intravenous contrast was administered. COMPARISON:  None. FINDINGS: Alignment: Physiologic.  Straightening of normal cervical lordosis. Vertebrae: Modic type 2 endplate signal changes at C4-5. No acute fracture. Cord: Hyperintense T2-weighted signal at the C4-5 levels within the spinal cord. Posterior Fossa, vertebral arteries, paraspinal tissues: Negative. Disc levels: C1-2: Unremarkable.  C2-3: Normal disc space and facet joints. There is no spinal canal stenosis. No neural foraminal stenosis. C3-4: Normal disc space and facet joints. There is no spinal canal stenosis. No neural foraminal stenosis. C4-5: Large central disc extrusion with components of superior and inferior migration. There is mass effect on the spinal cord with associated signal change. Severe spinal canal stenosis. Severe right and mild left neural foraminal stenosis. C5-6: Disc desiccation with mild uncovertebral hypertrophy. There is no spinal canal stenosis. No neural foraminal stenosis. C6-7: Normal disc space and facet joints. There is no spinal canal stenosis. No neural foraminal stenosis. C7-T1: Normal disc space and facet joints. There is no spinal canal stenosis. No neural foraminal stenosis. IMPRESSION: 1. 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 2. Severe right C4-5 neural foraminal stenosis. Electronically Signed   By: Deatra Robinson M.D.   On: 07/03/2020 22:02   DG Chest Portable 1 View  Result Date: 07/03/2020 CLINICAL DATA:  Weakness EXAM: PORTABLE CHEST 1 VIEW COMPARISON:  None. FINDINGS: The heart size and mediastinal contours are within normal limits. Both lungs are clear. The visualized skeletal structures are unremarkable. IMPRESSION: No acute abnormality of the lungs in AP portable projection. Electronically Signed   By: Lauralyn Primes M.D.   On: 07/03/2020 14:39   DG C-Arm 1-60 Min  Result Date: 07/04/2020 CLINICAL DATA:  Surgery, elective. Provided fluoroscopy time: 3 seconds. EXAM: DG  C-ARM 1-60 MIN COMPARISON:  Cervical spine MRI 07/03/2020. FINDINGS: PA and lateral view intraoperative fluoroscopic images of the cervical spine are submitted, 3 images total. On the final images, ACDF hardware is present at the C4-C5 level. No unexpected finding. Partially visualized ET tube. A surgical  sponge/packing material projects ventral to the ACDF hardware on the image taken at 5:21 p.m., but this is no longer present at time of the PA fluoroscopic image taken at 5:22 p.m. IMPRESSION: Three intraoperative fluoroscopic images of the cervical spine from C4-C5 ACDF, as described. Electronically Signed   By: Jackey Loge DO   On: 07/04/2020 18:17   DG C-Arm 1-60 Min  Result Date: 07/04/2020 CLINICAL DATA:  Surgery, elective. Provided fluoroscopy time: 3 seconds. EXAM: CERVICAL SPINE - 2-3 VIEW; DG C-ARM 1-60 MIN COMPARISON:  Cervical spine MRI 07/03/2020. FINDINGS: PA and lateral view intraoperative fluoroscopic images of the cervical spine are submitted, 3 images total. On the final images, ACDF hardware is present at the C4-C5 level. No unexpected finding. Partially visualized ET tube. A surgical sponge/packing material projects ventral to the ACDF hardware on the image taken at 5:21 p.m., but this is no longer present at time of the PA fluoroscopic image taken at 5:22 p.m. IMPRESSION: Three intraoperative fluoroscopic images of the cervical spine from C4-C5 ACDF, as described. Electronically Signed   By: Jackey Loge DO   On: 07/04/2020 17:54   ASSESSMENT AND PLAN:  Tayloranne Lekas is a 60 y.o. female presents to the ER via EMS after DSS can evaluate patient for on the floor.  Is having frequent falls not able to care for her himself as she lives at home alone.  Patient was admitted to [facility] in early January found to have Covid as well as MRI suggesting central cord syndrome.  Was evaluate by neurosurgery and discharged home for outpatient follow-up.  Large disc herniation at C4-5, Central cord syndrome with bilateral upper and lower extremity weakness:  --07/04/20-- status post anterior cervical decompression/discectomy by Dr Marcell Barlow -- physical therapy to see patient. Recommends CIR.  Hypokalemia and hypomagnesemia: Replete potassium and magnesium per pharmacy protocol  Polysubstance abuse  (cocaine, cannabinoids, alcohol, tobacco): She has been counseled to quit.  Ativan as needed for alcohol withdrawal syndrome.  Elevated blood pressure without diagnosis of hypertension -- PRN IV hydralazine. Will continue to monitor  Pressure Injury 07/04/20 Buttocks Left Stage 2 -  Partial thickness loss of dermis presenting as a shallow open injury with a red, pink wound bed without slough. (Active)  07/04/20 0030  Location: Buttocks  Location Orientation: Left  Staging: Stage 2 -  Partial thickness loss of dermis presenting as a shallow open injury with a red, pink wound bed without slough.  Wound Description (Comments):   Present on Admission: Yes    Procedures: anterior cervical decompression/discectomy Family communication : sister Alona Bene in the room Consults : neurosurgery CODE STATUS: full code DVT Prophylaxis : heparin Level of care: Med-Surg Status is: Inpatient  Remains inpatient appropriate because:Unsafe d/c plan   Dispo: The patient is from: Home              Anticipated d/c is to: CIR              Anticipated d/c date is: 2 days              Patient currently is medically stable to d/c.   Difficult to place patient No patient is status post day one cervical fusion/vasectomy. Physical therapy  recommends CIR. Awaiting decision from [facility] CIR. Will continue PT while patient is in house.       TOTAL TIME TAKING CARE OF THIS PATIENT: 25 minutes.  >50% time spent on counselling and coordination of care  Note: This dictation was prepared with Dragon dictation along with smaller phrase technology. Any transcriptional errors that result from this process are unintentional.  Enedina Finner M.D    [facility]   CC: Primary care physician; Patient, No Pcp PerPatient ID: Wendelyn Breslow, female   DOB: 29-Jul-1960, 60 y.o.   MRN: [id number]

## 2020-07-05 NOTE — Progress Notes (Signed)
   07/05/20 1030  Clinical Encounter Type  Visited With Family  Visit Type Initial;Spiritual support;Social support  Referral From Nurse  Consult/Referral To Chaplain  Responded to PG from the nurse for AD education. Pt was in physical therapy I talk to the Pt sister. Sister said she will take care of everything. I told sister if she needs the Ch to let the nurse know. Ch will follow up a needed.

## 2020-07-05 NOTE — Progress Notes (Signed)
PHARMACY CONSULT NOTE - FOLLOW UP  Pharmacy Consult for Electrolyte Monitoring and Replacement   Recent Labs: Potassium (mmol/L)  Date Value  07/04/2020 3.4 (L)   Magnesium (mg/dL)  Date Value  54/56/2563 1.5 (L)   Calcium (mg/dL)  Date Value  89/37/3428 9.2   Albumin (g/dL)  Date Value  76/81/1572 3.0 (L)   Sodium (mmol/L)  Date Value  07/04/2020 137    Assessment: 60 yo female admitted with central cord syndrome. Pharmacy consulted for electrolyte replacement.   Goal of Therapy:  Electrolytes WNL   Plan:  K: 3.4, Mg: 1.5- KCL x 1 and Mag 2g IV x 1 dose.   Gardner Candle, PharmD, BCPS Clinical Pharmacist 07/05/2020 11:19 AM

## 2020-07-05 NOTE — Progress Notes (Signed)
Rehab Admissions Coordinator Note:  Patient was screened by Clois Dupes for appropriateness for an Inpatient Acute Rehab Consult per PT recs. .  At this time, we are recommending Inpatient Rehab consult. I will place order per protocol.  Clois Dupes RN MSN 07/05/2020, 1:24 PM  I can be reached at [phone].

## 2020-07-05 NOTE — Progress Notes (Signed)
    Attending Progress Note  History: Yvette Phillips is here for central cord syndrome.  Yvette Phillips has had substantial neurologic decline prior to admission.  POD1: Yvette Phillips has noted an improvement in her legs and her hands.  Yvette Phillips continues to have symptoms of central cord  Physical Exam: Vitals:   07/05/20 1207 07/05/20 1549  BP: (!) 128/116 (!) 146/76  Pulse: 97 (!) 104  Resp: 18 18  Temp: 98.6 F (37 C) 98.3 F (36.8 C)  SpO2: 100% 98%    AA Ox3 CNI  Strength: Strength: Side Biceps Triceps Deltoid Interossei Grip Wrist Ext. Wrist Flex.  R 5 5 5 2 3 2 3   L 4+ 4+ 5 2 3 2 3    Side Iliopsoas Quads Hamstring PF DF EHL  R 4 5 5 5 5 5   L 4 4+ 5 5 5 5    Incision c/d/i  Data:  Recent Labs  Lab 07/03/20 1225 07/04/20 0519  NA 136 137  K 4.1 3.4*  CL 95* 100  CO2 24 26  BUN 13 11  CREATININE 1.01* 0.83  GLUCOSE 75 83  CALCIUM 9.9 9.2   Recent Labs  Lab 07/04/20 0519  AST 22  ALT 13  ALKPHOS 53     Recent Labs  Lab 07/03/20 1225 07/04/20 0519  WBC 9.3 7.5  HGB 15.5* 13.4  HCT 46.5* 39.7  PLT 287 263   Recent Labs  Lab 07/04/20 0837  INR 1.0         Other tests/results: C spine xrays pending  Assessment/Plan:  Yvette Phillips is doing well POD1 from C4-5 ACDF.  - mobilize - pain control - DVT prophylaxis - PTOT - Will likely need acute rehab due to the extent of her central cord syndrome - No brace needed    08/31/20 MD, [facility] Department of Neurosurgery

## 2020-07-05 NOTE — Plan of Care (Signed)

## 2020-07-05 NOTE — Evaluation (Signed)
Occupational Therapy Evaluation Patient Details Name: Yvette Phillips MRN: [id number] DOB: [birth year] Today's Date: 07/05/2020    History of Present Illness Pt is a 60 y.o. female presenting to hospital 07/03/20.  Per MD H&P: "Patient explains that on Christmas eve she fell in her apartment and since then she suddenly experienced severe weakness of her both arms and both legs.  At that time, patient eventually brought by EMS to [facility] on 12/27 and was hospitalized until 1/1.  During that hospitalization patient was diagnosed with central cord syndrome as well as an incidental diagnosis of mild COVID-19.  Patient was discharged home on 1/1 with outpatient neurosurgery follow-up for outpatient surgical intervention.  Since that time, patient has experienced progressively worsening weakness of the arms and legs.  Patient explains that she is basically "rolling around" her apartment to get around.  She has been inconsistently receiving her meals from her significant other.  After once again being unable to get up off the ground for several days, patient was once again found down after a well check and was brought into [facility] emergency department for evaluation".  Imaging negative for acute fx L knee.  MRI brain negative for acute intracranial abnormality.  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  Pt s/p ACDF C4-5 07/04/20.  Per chart, no pertinent PMH.   Clinical Impression   Pt seen for OT evaluation this date in setting of acute hospitalization following a fall and now s/p ACDF C4-5. Pt reports being INDEP prior to November/December of 2021. States after one serious fall at that time and hospitalization at [facility], she has been requiring increased assistance from her significant other. 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 Some difficulty piecing PLOF/home setup information together as pt reports that she isn't very ambulatory, but no mention of W/c use. Pt Does endorse that her significant other has been performing several aspects of self care ADLs since that time and endorses that it has gotten worse this time including needing assistance even for feeding. Pt presents this date with extremity and trunk weakness including decreased fine motor coordination in b/l hands with L slightly worse than R. Pt's ADLs are significantly impacted at this time. She is requiring MIN/MOD A for bed level or seated UB ADLs d/t decreased ROM and FMC to hold objects such as a washcloth. Pt requires MAX to TOTAL A for bed level or seated LB ADLs and cannot tolerate participation in standing self care at this time d/t very poor standing balance requiring 2p assist for transfers, weight shift and to sustain static stand. Pt requires MOD A +2 to stand from elevated surface and cues to lean for weight shifting as she is with limited hip/knee flexion 2/2 weakness. Will continue to follow acutely, but as pt presents with significant deficits versus her baseline, anticipate she will require extensive rehabilitation at CIR to improve safety and likelihood of recovery of ADLs skills.    Follow Up Recommendations  CIR    Equipment Recommendations  Other (comment) (defer to next venue of care)    Recommendations for Other Services       Precautions / Restrictions Precautions Precautions: Fall;Cervical Precaution Comments: s/p ACDF C4-5 (per secure messaging with MD Myer Haff 07/05/20--no cervical brace needed) Restrictions Weight Bearing  Restrictions: No      Mobility Bed Mobility Overal bed mobility: Needs Assistance Bed Mobility: Rolling;Sidelying to Sit;Sit to Sidelying Rolling: Min assist;Mod assist Sidelying to sit: Max assist;+2 for safety/equipment (trunk and LE  assist)   Sit to supine: Mod assist (R sidelying to supine assist for LE's) Sit to sidelying: Max assist;+2 for safety/equipment General bed mobility comments: vc's and tactile cues for technique and spinal precautions; increased time to perform    Transfers Overall transfer level: Needs assistance Equipment used: Rolling walker (2 wheeled) Transfers: Sit to/from Stand Sit to Stand: Mod assist;+2 physical assistance;From elevated surface         General transfer comment: bed elevated ~3-4", pt tolerates to STS trials with bilateral knee blocking, cues for hand placement, cues for LE placement from PT, tactile cues to extend to full stand for fall prevention/rotatee pelvis FWD, and cues to control descent to sitting to avoid further trauma to spine/back.    Balance Overall balance assessment: Needs assistance Sitting-balance support: No upper extremity supported;Feet supported Sitting balance-Leahy Scale: Fair Sitting balance - Comments: steady static sitting (once positioned safely in sitting) but requiring at least single UE support for reaching within BOS   Standing balance support: Bilateral upper extremity supported Standing balance-Leahy Scale: Poor Standing balance comment: pt requiring B UE support on RW and 2 assist for safety standing (pt not bracing LE's against bed this afternoon)                    ADL either performed or assessed with clinical judgement   ADL Overall ADL's : Needs assistance/impaired                       General ADL Comments: Pt requires MIN/MOD A for bed level or seated UB ADLs d/t decreased ROM and FMC to hold objects such as a washcloth. Pt requires MAX to TOTAL A for bed level or seated LB ADLs and cannot tolerate participation in standing self care at this time d/t very poor standing balance requiring 2p assist for transfers, weight shift and to sustain static stand.     Vision Patient Visual Report: No change from baseline        Perception     Praxis      Pertinent Vitals/Pain Pain Assessment: Faces Faces Pain Scale: Hurts a little bit Pain Location: some general c/o soreness with mobilizing, states she feels stiff Pain Descriptors / Indicators: Sore Pain Intervention(s): Monitored during session;Repositioned     Hand Dominance     Extremity/Trunk Assessment Upper Extremity Assessment Upper Extremity Assessment: RUE deficits/detail;LUE deficits/detail RUE Deficits / Details: Pt with shld flexion to ~1/4 range. Almost absent wrist extension (2/5, gravity decreased plane), grip grossly 3+/5, Elbow flexion grossly 4-/5 RUE Coordination: decreased fine motor;decreased gross motor LUE Deficits / Details: Pt able to perform shoulder flexion to ~1/3 range. Decreased almost absent wrist extension. Pt's elbow flexion MMT grossly 3+/5, grip MMT grossly 3/5 LUE Coordination: decreased fine motor;decreased gross motor   Lower Extremity Assessment Lower Extremity Assessment: Defer to PT evaluation;Generalized weakness (L appears slightly weaker than R. Overall pt with limited hip flexion in sitting against gravity and decreased hip/knee flexion for stepping in standing. Pt with some mild knee buckling as well requiring blocking bilaterally.) RLE Coordination: decreased fine motor;decreased gross motor LLE Coordination: decreased fine motor;decreased gross motor       Communication Communication Communication: No difficulties   Cognition Arousal/Alertness: Awake/alert Behavior During Therapy:  WFL for tasks assessed/performed Overall Cognitive Status: Within Functional Limits for tasks assessed                                 General Comments: While pt appears oriented, she does have difficulty describing timeline of events leading up to her current mobility status.   General Comments       Exercises General Exercises - Lower Extremity Ankle Circles/Pumps:  AROM;Right;AAROM;Left;Strengthening;10 reps;Supine Quad Sets: AROM;Strengthening;Both;10 reps;Supine Gluteal Sets: AROM;Strengthening;Both;10 reps;Supine Short Arc Quad: AAROM;Strengthening;Both;10 reps;Supine Heel Slides: AAROM;Strengthening;Both;10 reps;Supine Hip ABduction/ADduction: AAROM;Strengthening;Both;10 reps;Supine Other Exercises Other Exercises: OT facilitates ed re: role of OT, importance of OOB activity, potential need for f/u therapy upon d/c from acute setting. Pt with good understanding.   Shoulder Instructions      Home Living Family/patient expects to be discharged to:: Private residence Living Arrangements: Alone Available Help at Discharge: Other (Comment);Available PRN/intermittently (significant other) Type of Home: Apartment Home Access: Stairs to enter Entrance Stairs-Number of Steps: 4-5 Entrance Stairs-Rails: Left;Right Home Layout: One level     Bathroom Shower/Tub: Walk-in shower (pt reports issue with shower at home and states she has been showering at work instead)   Firefighter: Standard     Home Equipment: Environmental consultant - 2 wheels;Walker - 4 wheels   Additional Comments: Pt reports living in studio apt/duplex.      Prior Functioning/Environment Level of Independence: Needs assistance  Gait / Transfers Assistance Needed: Pt reports that prior to her initial fall in November or DEcember of 2021, that she was walking with no device. States that since that time, she has been struggling and needing walker or hand-held assist from her significant other to stand and pivot. States she has not really walked since before chistmas time. However, does not mention wheelchair use. ADL's / Homemaking Assistance Needed: States she has been needing assist with several ADLs since fall in December/November 2021 from her significant other. 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  Since then pt has been using RW but most recently pt's friend (who lives down street) has been assisting her OOB and to get into chair (hasn't been able to walk with RW).        OT Problem List: Decreased strength;Decreased range of motion;Decreased activity tolerance;Impaired balance (sitting and/or standing);Decreased coordination;Decreased knowledge of use of DME or AE;Decreased knowledge of precautions;Impaired tone;Impaired UE functional use      OT Treatment/Interventions: Self-care/ADL training;DME and/or AE instruction;Therapeutic activities;Balance training;Therapeutic exercise;Neuromuscular education;Energy conservation;Patient/family education;Manual therapy    OT Goals(Current goals can be found in the care plan section) Acute Rehab OT Goals Patient Stated Goal: to improve strength and mobility OT Goal Formulation: With patient Time For Goal Achievement: 07/19/20 Potential to Achieve Goals: Good ADL Goals Pt Will Perform Grooming: with set-up;sitting (supported sitting with universal cuff/built up handles as needed to complete 1-2 g/h tasks (elbow supported on pillows as needed)) Pt Will Perform Upper Body Dressing: with min guard assist;sitting (CGA to orient clothing item and MIN cues for modified dressing technique as well as dycem to maintain grip on clothing as needed.) Pt Will Perform Lower Body Dressing: with min assist;sitting/lateral leans (long sitting in bed/supported sitting as needed,  with leg lifter and circle sit method as needed to reach LEs to complete) Pt Will Transfer to Toilet: with min assist;with mod assist;stand pivot transfer;bedside commode (with RW PRN) Pt Will Perform Toileting - Clothing Manipulation and hygiene: with mod assist;sitting/lateral leans (with AE PRN) Pt/caregiver will Perform Home  Exercise Program: Increased ROM;Increased strength;Both right and left upper extremity;With minimal assist  OT Frequency: Min 3X/week   Barriers to D/C:            Co-evaluation PT/OT/SLP Co-Evaluation/Treatment: Yes Reason for Co-Treatment: Complexity of the patient's impairments (multi-system involvement);For patient/therapist safety;To address functional/ADL transfers PT goals addressed during session: Mobility/safety with mobility;Balance;Proper use of DME;Strengthening/ROM OT goals addressed during session: ADL's and self-care;Proper use of Adaptive equipment and DME      AM-PAC OT "6 Clicks" Daily Activity     Outcome Measure Help from another person eating meals?: A Little Help from another person taking care of personal grooming?: A Little Help from another person toileting, which includes using toliet, bedpan, or urinal?: A Lot Help from another person bathing (including washing, rinsing, drying)?: A Lot Help from another person to put on and taking off regular upper body clothing?: A Lot Help from another person to put on and taking off regular lower body clothing?: Total 6 Click Score: 13   End of Session Equipment Utilized During Treatment: Gait belt;Rolling walker Nurse Communication: Mobility status  Activity Tolerance: Patient tolerated treatment well Patient left: in bed;with call bell/phone within reach;Other (comment) (with PT finishing up exercise.)  OT Visit Diagnosis: Unsteadiness on feet (R26.81);Muscle weakness (generalized) (M62.81);Repeated falls (R29.6);Hemiplegia and hemiparesis;Other symptoms and signs involving the nervous system (R29.898) Hemiplegia - Right/Left:  (b/l weakness)                Time: 7473-4037 OT Time Calculation (min): 58 min Charges:  OT General Charges $OT Visit: 1 Visit OT Evaluation $OT Eval Moderate Complexity: 1 Mod OT Treatments $Self Care/Home Management : 23-37 mins $Therapeutic Activity: 8-22 mins  Rejeana Brock, MS,  OTR/L ascom [phone] 07/05/20, 6:07 PM

## 2020-07-05 NOTE — Progress Notes (Signed)
PHARMACIST - PHYSICIAN COMMUNICATION  CONCERNING:  Enoxaparin (Lovenox) for DVT Prophylaxis    RECOMMENDATION: Patient was prescribed enoxaprin 40mg  q24 hours for VTE prophylaxis.   Filed Weights   07/03/20 1213  Weight: 104.3 kg (230 lb)    Body mass index is 36.02 kg/m.  Estimated Creatinine Clearance: 88.4 mL/min (by C-G formula based on SCr of 0.83 mg/dL).   Based on [facility] policy patient is candidate for enoxaparin 0.5mg /kg TBW SQ every 24 hours based on BMI being >30.  DESCRIPTION: Pharmacy has adjusted enoxaparin dose per [facility] policy.  Patient is now receiving enoxaparin 0.5 mg/kg every 24 hours   [facility], PharmD, [facility] 07/05/2020 6:15 AM

## 2020-07-05 NOTE — Progress Notes (Signed)
Physical Therapy Treatment Patient Details Name: Yvette Phillips MRN: [id number] DOB: 1961-04-09 Today's Date: 07/05/2020    History of Present Illness Pt is a 60 y.o. female presenting to hospital 07/03/20.  Per MD H&P: "Patient explains that on Christmas eve she fell in her apartment and since then she suddenly experienced severe weakness of her both arms and both legs.  At that time, patient eventually brought by EMS to [facility] on 12/27 and was hospitalized until 1/1.  During that hospitalization patient was diagnosed with central cord syndrome as well as an incidental diagnosis of mild COVID-19.  Patient was discharged home on 1/1 with outpatient neurosurgery follow-up for outpatient surgical intervention.  Since that time, patient has experienced progressively worsening weakness of the arms and legs.  Patient explains that she is basically "rolling around" her apartment to get around.  She has been inconsistently receiving her meals from her significant other.  After once again being unable to get up off the ground for several days, patient was once again found down after a well check and was brought into [facility] emergency department for evaluation".  Imaging negative for acute fx L knee.  MRI brain negative for acute intracranial abnormality.  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  Pt s/p ACDF C4-5 07/04/20.  Per chart, no pertinent PMH.    PT Comments    Pt in bed upon PT arrival participating in OT session; performed PT/OT co-session for functional mobility.  Pt requiring 2 assist to stand up to RW from elevated bed height x2 trials; pt not bracing LE's against bed this afternoon (B knees blocked for safety though).  Pt requiring intermittent vc's for upright posture in standing.  Min to mod assist x2 to take a couple  steps in place with RW and then on 2nd trial min to mod assist x2 to sidestep a few feet to R along bed using RW.  Tolerated LE ex's in bed with assist fairly well.  Pt progressing with functional mobility and L LE appearing a little stronger this afternoon.  Pt very motivated to participate in therapy and improve strength and functional status.  Continue to recommend CIR.   Follow Up Recommendations  CIR     Equipment Recommendations   (TBD at next facility)    Recommendations for Other Services OT consult     Precautions / Restrictions Precautions Precautions: Fall;Cervical Precaution Comments: s/p ACDF C4-5 (per secure messaging with MD Myer Haff 07/05/20--no cervical brace needed) Restrictions Weight Bearing Restrictions: No    Mobility  Bed Mobility Overal bed mobility: Needs Assistance Bed Mobility: Rolling;Sidelying to Sit;Sit to Sidelying;Sit to Supine Rolling: Min assist;Mod assist Sidelying to sit: Max assist;+2 for safety/equipment (assist for trunk and B LE's)   Sit to supine: Mod assist (R sidelying to supine assist for LE's) Sit to sidelying: Max assist;+2 for safety/equipment (assist for trunk and B LE's) General bed mobility comments: vc's and tactile cues for technique and spinal precautions; increased time to perform    Transfers Overall transfer level: Needs assistance Equipment used: Rolling walker (2 wheeled) Transfers: Sit to/from Stand Sit to Stand: Mod assist;+2 physical assistance;From elevated surface         General transfer comment: vc's and assist for UE/LE placement; assist to initiate and come to full stand and control descent sitting  Ambulation/Gait Ambulation/Gait assistance: Min assist;Mod  assist;+2 physical assistance Gait Distance (Feet):  (2 steps in place B LE's; sidestepping a few feet to R along bed) Assistive device: Rolling walker (2 wheeled)   Gait velocity: decreased   General Gait Details: pt able to clear floor with L LE  1/2 trials taking steps in place and 2/2 trials taking steps in place with RW use (opposite knee blocked for safety); pt then had sitting rest break; pt then able to take steps laterally to R a few feet with RW use and 2 assist; vc's required for walker use and stepping technique   Stairs             Wheelchair Mobility    Modified Rankin (Stroke Patients Only)       Balance Overall balance assessment: Needs assistance Sitting-balance support: No upper extremity supported;Feet supported Sitting balance-Leahy Scale: Fair Sitting balance - Comments: steady static sitting (once positioned safely in sitting) but requiring at least single UE support for reaching within BOS   Standing balance support: Bilateral upper extremity supported Standing balance-Leahy Scale: Poor Standing balance comment: pt requiring B UE support on RW and 2 assist for safety standing (pt not bracing LE's against bed this afternoon)                            Cognition Arousal/Alertness: Awake/alert Behavior During Therapy: WFL for tasks assessed/performed Overall Cognitive Status: Within Functional Limits for tasks assessed                                        Exercises General Exercises - Lower Extremity Ankle Circles/Pumps: AROM;Right;AAROM;Left;Strengthening;10 reps;Supine Quad Sets: AROM;Strengthening;Both;10 reps;Supine Gluteal Sets: AROM;Strengthening;Both;10 reps;Supine Short Arc Quad: AAROM;Strengthening;Both;10 reps;Supine Heel Slides: AAROM;Strengthening;Both;10 reps;Supine Hip ABduction/ADduction: AAROM;Strengthening;Both;10 reps;Supine    General Comments  Pt agreeable to therapy session.      Pertinent Vitals/Pain Pain Assessment: No/denies pain  Vitals (HR and O2 on room air) stable and WFL throughout treatment session.     Home Living Family/patient expects to be discharged to:: Private residence Living Arrangements: Alone   Type of Home:  Apartment Home Access: Stairs to enter Entrance Stairs-Rails: Left;Right Home Layout: One level Home Equipment: Environmental consultant - 2 wheels;Walker - 4 wheels Additional Comments: Pt reports living in studio apt/duplex.    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  Since then pt has been using RW but most recently pt's friend (who lives down street) has been assisting her OOB and to get into chair (hasn't been able to walk with RW).   PT Goals (current goals can now be found in the care plan section) Acute Rehab PT Goals Patient Stated Goal: to improve strength and mobility PT Goal Formulation: With patient Time For Goal Achievement: 07/19/20 Potential to Achieve Goals: Good Progress towards PT goals: Progressing toward goals    Frequency    BID      PT Plan Current plan remains appropriate    Co-evaluation PT/OT/SLP Co-Evaluation/Treatment: Yes Reason for Co-Treatment: Complexity of the patient's impairments (multi-system involvement);For patient/therapist safety;To address functional/ADL transfers PT goals addressed during session: Mobility/safety with mobility;Balance;Proper use of DME;Strengthening/ROM OT goals addressed during session: ADL's and  self-care      AM-PAC PT "6 Clicks" Mobility   Outcome Measure  Help needed turning from your back to your side while in a flat bed without using bedrails?: A Little Help needed moving from lying on your back to sitting on the side of a flat bed without using bedrails?: A Lot Help needed moving to and from a bed to a chair (including a wheelchair)?: Total Help needed standing up from a chair using your arms (e.g., wheelchair or bedside chair)?: Total Help needed to walk in hospital room?: Total Help needed climbing 3-5 steps with a  railing? : Total 6 Click Score: 9    End of Session Equipment Utilized During Treatment: Gait belt Activity Tolerance: Patient tolerated treatment well Patient left: in bed;with call bell/phone within reach;with bed alarm set;Other (comment) (B heels floating via pillow support) Nurse Communication: Mobility status;Precautions PT Visit Diagnosis: Other abnormalities of gait and mobility (R26.89);Unsteadiness on feet (R26.81);Muscle weakness (generalized) (M62.81);History of falling (Z91.81);Difficulty in walking, not elsewhere classified (R26.2)     Time: 3846-6599 PT Time Calculation (min) (ACUTE ONLY): 40 min  Charges:  $Therapeutic Exercise: 8-22 mins $Therapeutic Activity: 8-22 mins                    Hendricks Limes, PT 07/05/20, 3:47 PM

## 2020-07-06 ENCOUNTER — Inpatient Hospital Stay: Payer: Medicaid Other

## 2020-07-06 DIAGNOSIS — R1314 Dysphagia, pharyngoesophageal phase: Secondary | ICD-10-CM

## 2020-07-06 DIAGNOSIS — R1310 Dysphagia, unspecified: Secondary | ICD-10-CM

## 2020-07-06 IMAGING — RF DG ESOPHAGUS
9 of 11 series · 14 of 23 positions shown · non-contrast
Comparison: None.

CLINICAL DATA: Cervical fusion 2 days ago.  Painful swallowing.

EXAM:
ESOPHAGUS/BARIUM SWALLOW/TABLET STUDY
TECHNIQUE: Initial scout AP supine abdominal image obtained to insure adequate
colon cleansing. Barium was introduced into the colon in a
retrograde fashion and refluxed from the rectum to the cecum. Spot
images of the colon followed by overhead radiographs were obtained.
FLUOROSCOPY TIME:  Fluoroscopy Time:  0.7 minute
Radiation Exposure Index (if provided by the fluoroscopic device):
6.8 mGy
Number of Acquired Spot Images: 0

[Series 1: cp_standard · 0.26mm/px · 2 of 6 frames shown (1 of 9)]
[frame 1/6]
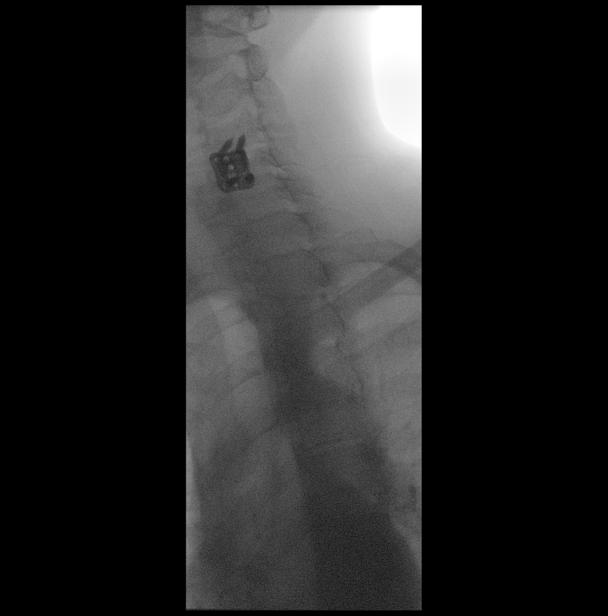
[frame 6/6]
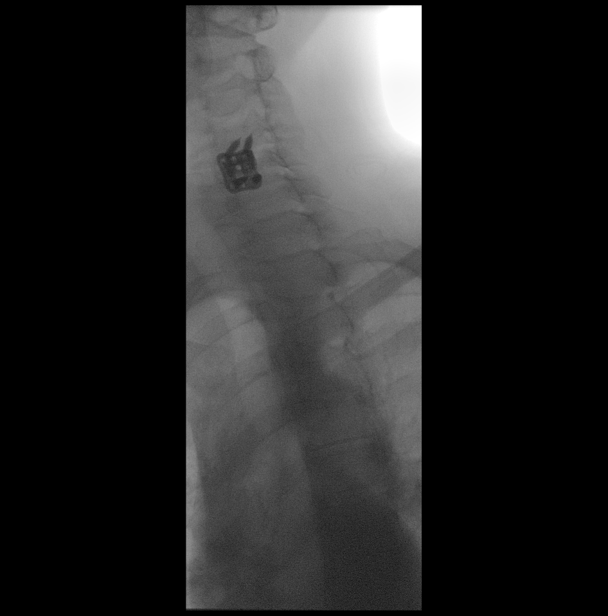

[Series 2: cp_standard · 0.26mm/px · 2 of 46 frames shown (2 of 9)]
[frame 24/46]
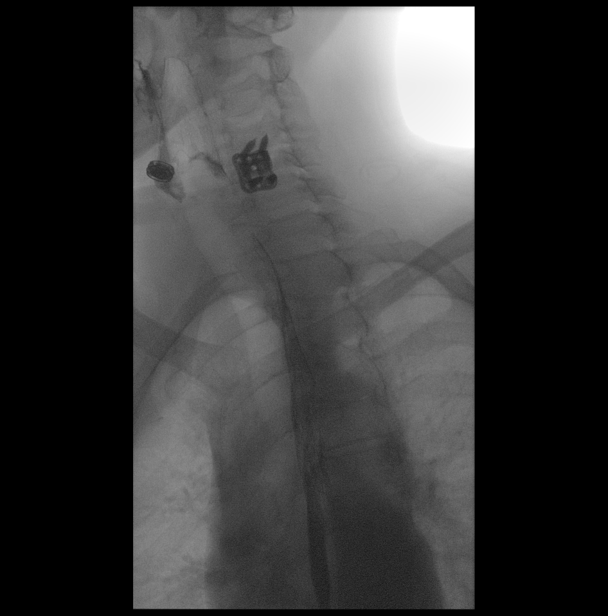
[frame 40/46]
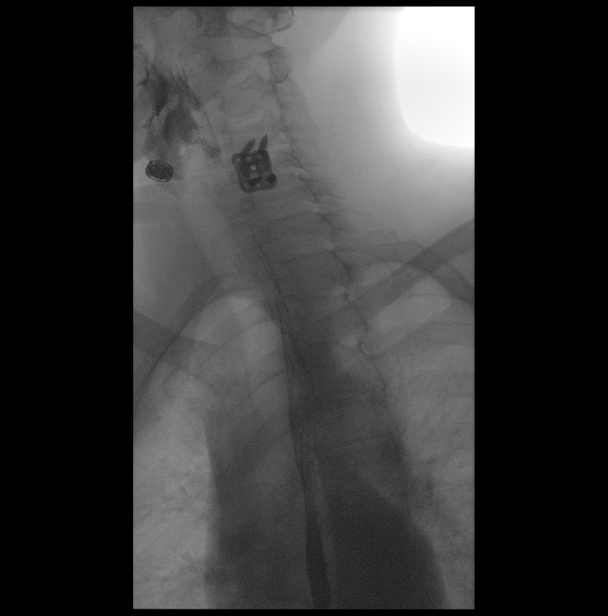

[Series 3: cp_standard · 0.26mm/px · 2 of 49 frames shown (3 of 9)]
[frame 8/49]
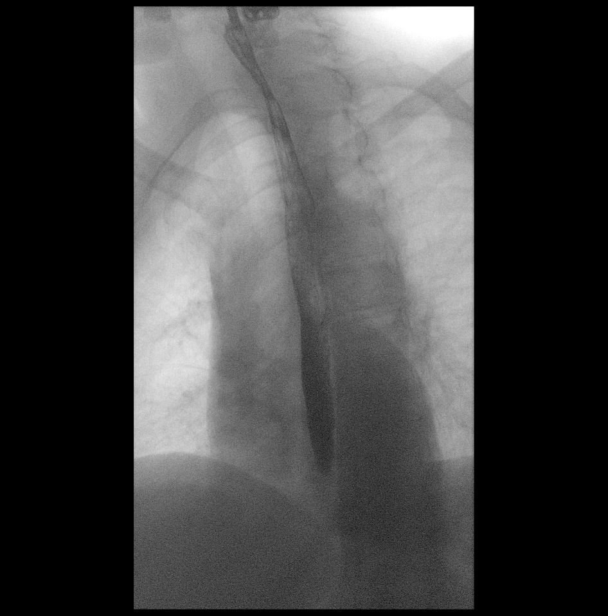
[frame 42/49]
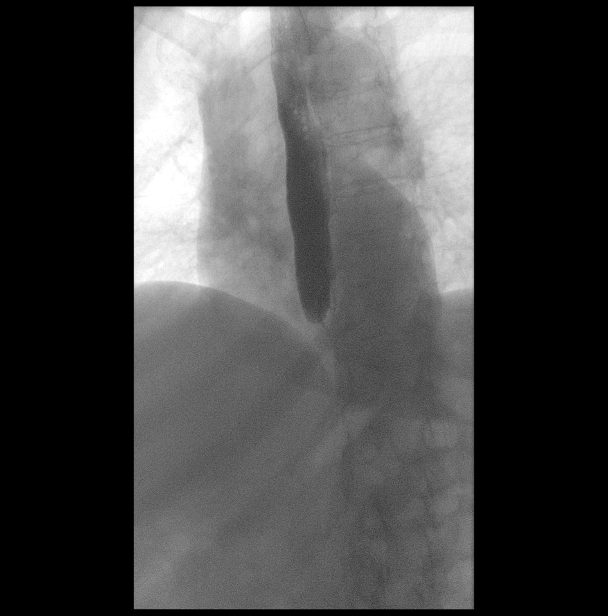

[Series 4: cp_standard · 0.26mm/px · 3 of 30 frames shown (4 of 9)]
[frame 1/30]
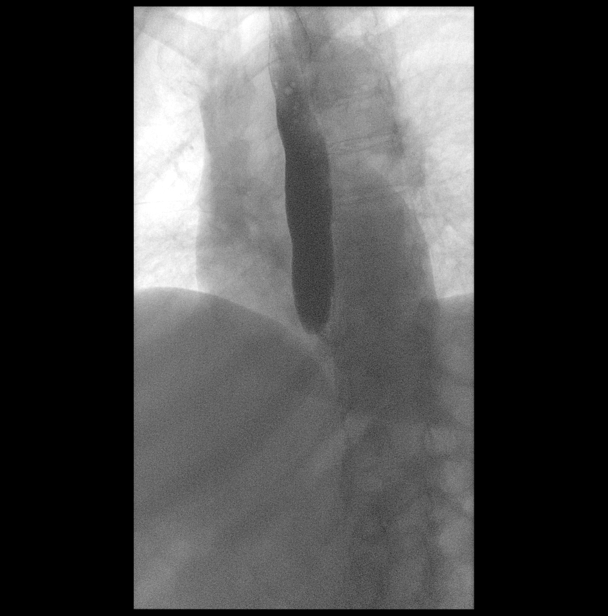
[frame 16/30]
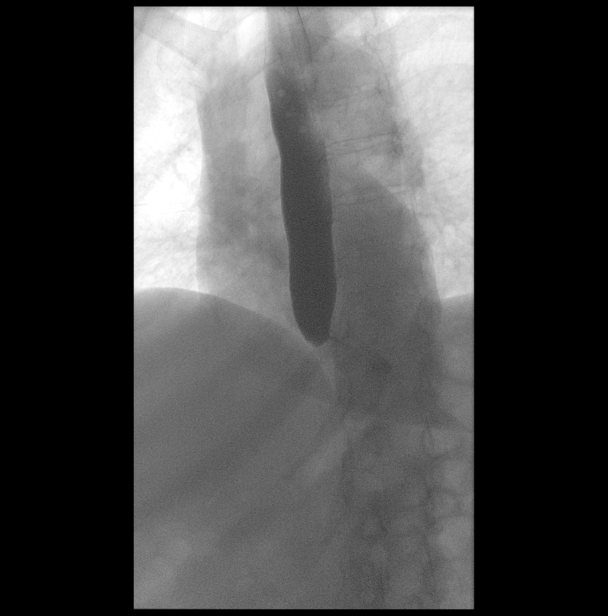
[frame 26/30]
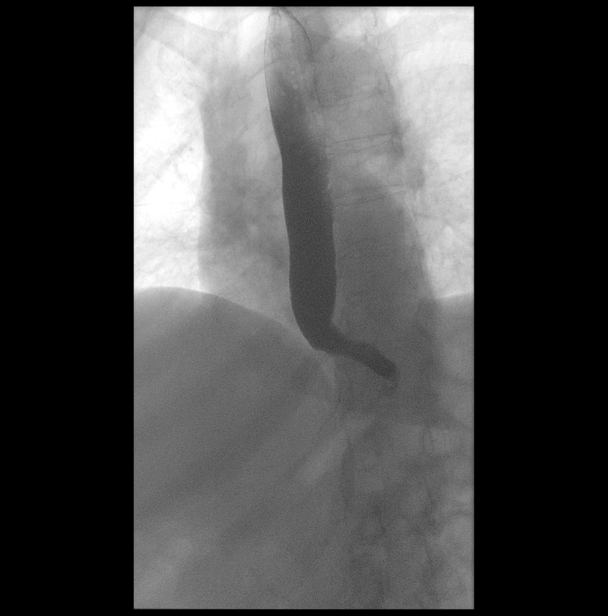

[Series 5: cp_standard · 0.26mm/px · 1 of 6 frames shown (5 of 9)]
[frame 4/6]
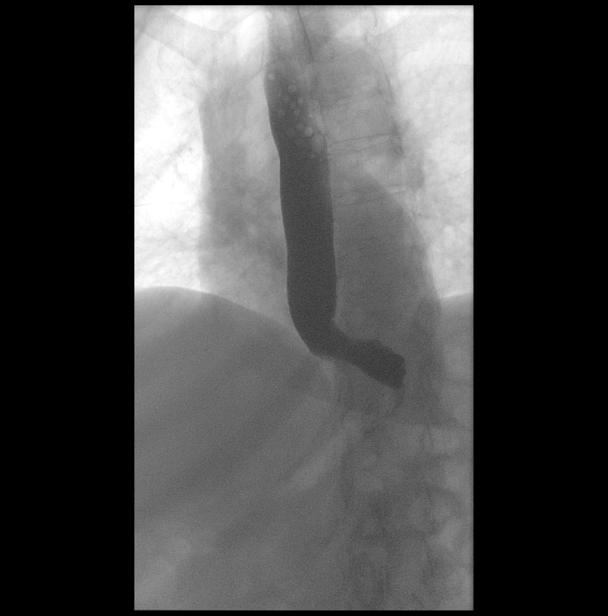

[Series 6: cp_standard · 0.26mm/px · 1 of 1 slices shown (6 of 9)]
[im 1/1]
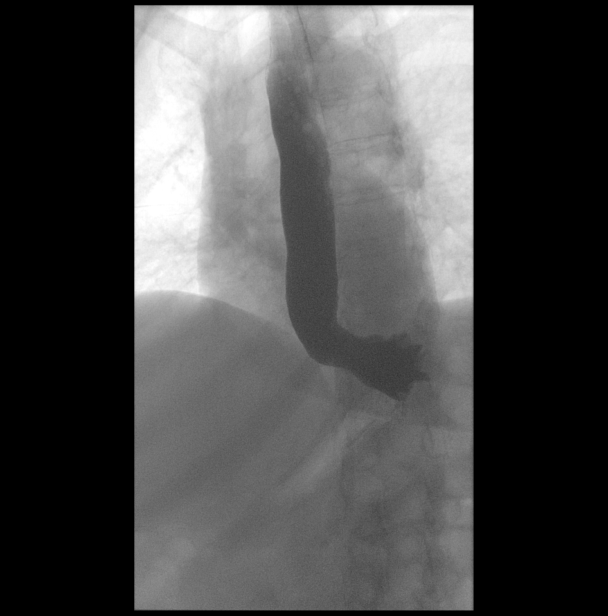

[Series 7: cp_standard · 0.26mm/px · 1 of 1 slices shown (7 of 9)]
[im 1/1]
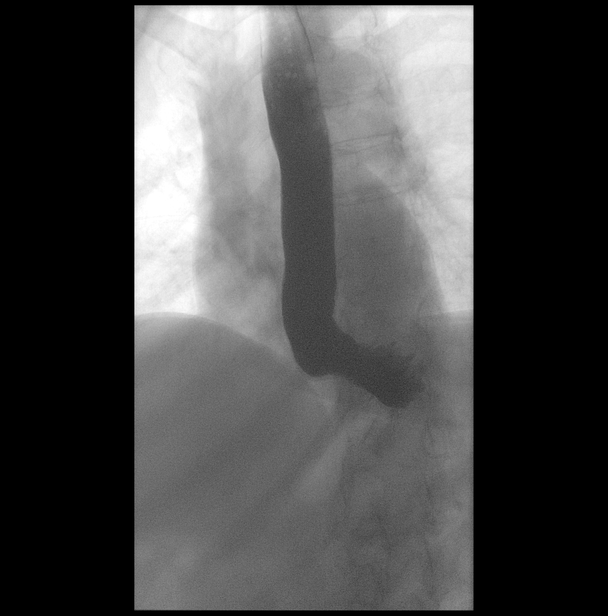

[Series 9: cp_standard · 0.27mm/px · 1 of 1 slices shown (8 of 9)]
[im 1/1]
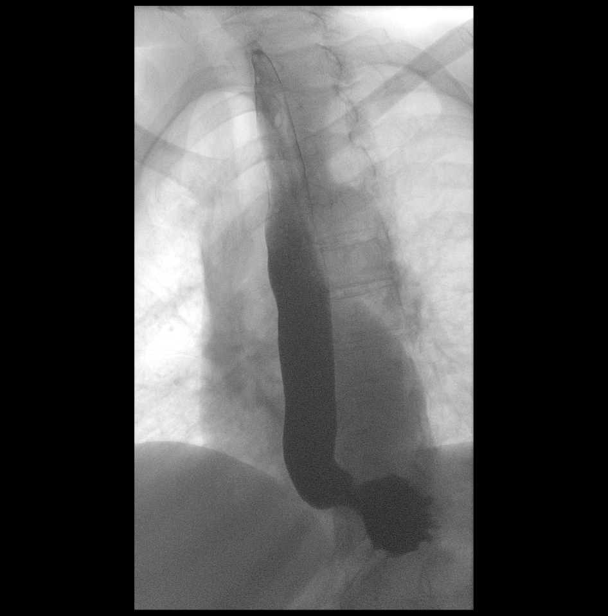

[Series 11: cp_standard · 0.27mm/px · 1 of 1 slices shown (9 of 9)]
[im 1/1]
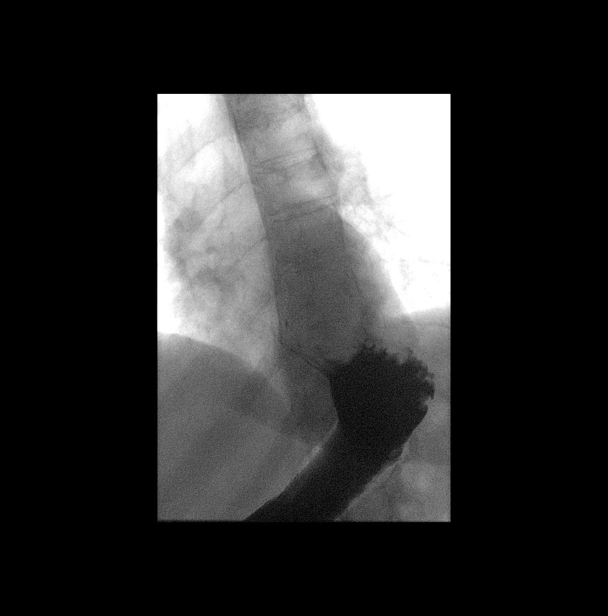

[14 of 23 positions shown; findings below may reference images not displayed]

FINDINGS: Normal pharyngeal anatomy and motility. Contrast flowed freely
through the esophagus without evidence of a stricture or mass.
Normal esophageal mucosa without evidence of irregularity or
ulceration. Esophageal motility was normal. No evidence of reflux.
No definite hiatal hernia was demonstrated.
IMPRESSION: No esophageal stricture, mass or ulceration. Normal esophageal
motility.

## 2020-07-06 NOTE — Progress Notes (Signed)
Physical Therapy Treatment Patient Details Name: Yvette Phillips MRN: [id number] DOB: 1960/08/28 Today's Date: 07/06/2020    History of Present Illness Pt is a 60 y.o. female presenting to hospital 07/03/20.  Per MD H&P: "Patient explains that on Christmas eve she fell in her apartment and since then she suddenly experienced severe weakness of her both arms and both legs.  At that time, patient eventually brought by EMS to [facility] on 12/27 and was hospitalized until 1/1.  During that hospitalization patient was diagnosed with central cord syndrome as well as an incidental diagnosis of mild COVID-19.  Patient was discharged home on 1/1 with outpatient neurosurgery follow-up for outpatient surgical intervention.  Since that time, patient has experienced progressively worsening weakness of the arms and legs.  Patient explains that she is basically "rolling around" her apartment to get around.  She has been inconsistently receiving her meals from her significant other.  After once again being unable to get up off the ground for several days, patient was once again found down after a well check and was brought into [facility] emergency department for evaluation".  Imaging negative for acute fx L knee.  MRI brain negative for acute intracranial abnormality.  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  Pt s/p ACDF C4-5 07/04/20.  Per chart, no pertinent PMH.    PT Comments    PT/OT partial co-session performed.  Able to perform 3 stands up to RW with min to mod assist x2 and cueing for upright posture once standing.  Performed ex's in sitting and practiced taking steps in standing (B UE support on RW).  Pt able to sidestep to R along bed a few feet with RW and 2 assist.  Trialed B walker hand splints this session again but pt may benefit  from trial of B platform RW instead d/t hand grip/UE weakness.  Will continue to focus on strengthening and progressive functional mobility during hospitalization.  Continue to recommend CIR.   Follow Up Recommendations  CIR     Equipment Recommendations  Other (comment) (TBD at next facility)    Recommendations for Other Services OT consult     Precautions / Restrictions Precautions Precautions: Fall;Cervical Precaution Comments: s/p ACDF C4-5 (per secure messaging with MD Myer Haff 07/05/20--no cervical brace needed) Restrictions Weight Bearing Restrictions: No    Mobility  Bed Mobility Overal bed mobility: Needs Assistance Bed Mobility: Rolling;Sidelying to Sit;Sit to Sidelying;Sit to Supine Rolling: Min assist;Mod assist (logrolling to R side) Sidelying to sit: Max assist;HOB elevated (assist for trunk and B LE's)   Sit to supine: Mod assist;+2 for safety/equipment;HOB elevated (assist for LE's R sidelying to supine) Sit to sidelying: Mod assist;+2 for physical assistance (sit to R sidelying) General bed mobility comments: vc's and tactile cues for technique and logrolling; increased time to perform    Transfers Overall transfer level: Needs assistance Equipment used: Rolling walker (2 wheeled) Transfers: Sit to/from Stand Sit to Stand: Min assist;Mod assist;+2 physical assistance Stand pivot transfers: Mod assist;+2 physical assistance (stand pivot recliner to/from [facility])       General transfer comment: x3 trials standing up to walker with vc's and tactile cues for UE and LE placement (prior to standing--min assist to scoot towards edge of bed with vc's and tactile cues for techniqe); assist to initiate and come to full stand; assist to control descent  sitting; bed in lowest position  Ambulation/Gait Ambulation/Gait assistance: Min guard;Min assist;Mod assist;+2 physical assistance Gait Distance (Feet): 3 Feet (sidestepping to R along bed) Assistive device: Rolling  walker (2 wheeled)   Gait velocity: decreased   General Gait Details: vc's for upright posture and taking steps; assist for walker use; assist for balance; L knee mildly flexed at times but able to straighten with vc's   Stairs             Wheelchair Mobility    Modified Rankin (Stroke Patients Only)       Balance Overall balance assessment: Needs assistance Sitting-balance support: No upper extremity supported;Feet supported Sitting balance-Leahy Scale: Good Sitting balance - Comments: steady sitting with at least single UE support   Standing balance support: Bilateral upper extremity supported Standing balance-Leahy Scale: Poor Standing balance comment: pt requiring B UE support on RW and 2 assist for safety standing (pt not bracing LE's against bed)                            Cognition Arousal/Alertness: Awake/alert Behavior During Therapy: WFL for tasks assessed/performed Overall Cognitive Status: Within Functional Limits for tasks assessed                                        Exercises Total Joint Exercises Ankle Circles/Pumps: AAROM;Strengthening;Both;10 reps;Supine Long Arc Quad: AROM;Strengthening;Both;10 reps;Seated (decreased L LAQ ROM compared to R) Marching in Standing: AROM;Strengthening;Both;5 reps;Standing (2 assist for balance; pt with difficulty clearing feet from floor L more than R) General Exercises - Lower Extremity Hip Flexion/Marching: AROM;Right;AAROM;Left;Strengthening;10 reps;Seated    General Comments        Pertinent Vitals/Pain Pain Assessment: No/denies pain Faces Pain Scale: No hurt Pain Intervention(s): Limited activity within patient's tolerance;Monitored during session;Repositioned  Vitals (HR and O2 on room air) stable and WFL throughout treatment session.    Home Living                      Prior Function            PT Goals (current goals can now be found in the care plan  section) Acute Rehab PT Goals Patient Stated Goal: to improve strength and mobility PT Goal Formulation: With patient Time For Goal Achievement: 07/19/20 Potential to Achieve Goals: Good Progress towards PT goals: Progressing toward goals    Frequency    BID      PT Plan Current plan remains appropriate    Co-evaluation PT/OT/SLP Co-Evaluation/Treatment: Yes Reason for Co-Treatment: Complexity of the patient's impairments (multi-system involvement);For patient/therapist safety;To address functional/ADL transfers PT goals addressed during session: Mobility/safety with mobility;Balance;Proper use of DME OT goals addressed during session: ADL's and self-care;Proper use of Adaptive equipment and DME      AM-PAC PT "6 Clicks" Mobility   Outcome Measure  Help needed turning from your back to your side while in a flat bed without using bedrails?: A Little Help needed moving from lying on your back to sitting on the side of a flat bed without using bedrails?: A Lot Help needed moving to and from a bed to a chair (including a wheelchair)?: Total Help needed standing up from a chair using your arms (e.g., wheelchair or bedside chair)?: Total Help needed to walk in hospital room?: Total Help needed climbing 3-5 steps with a railing? :  Total 6 Click Score: 9    End of Session Equipment Utilized During Treatment: Gait belt Activity Tolerance: Patient tolerated treatment well Patient left: in bed;with bed alarm set (OT present for session (OT to set pt up end of session)) Nurse Communication: Mobility status;Precautions PT Visit Diagnosis: Other abnormalities of gait and mobility (R26.89);Unsteadiness on feet (R26.81);Muscle weakness (generalized) (M62.81);History of falling (Z91.81);Difficulty in walking, not elsewhere classified (R26.2)     Time: 3825-0539 PT Time Calculation (min) (ACUTE ONLY): 28 min  Charges:  $Therapeutic Exercise: 8-22 mins $Therapeutic Activity: 8-22  mins                    Hendricks Limes, PT 07/06/20, 3:42 PM

## 2020-07-06 NOTE — Evaluation (Signed)
Clinical/Bedside Swallow Evaluation Patient Details  Name: Yvette Phillips MRN: [id number] Date of Birth: [birth year]  Today's Date: 07/06/2020 Time: SLP Start Time (ACUTE ONLY): 1050 SLP Stop Time (ACUTE ONLY): 1130 SLP Time Calculation (min) (ACUTE ONLY): 40 min  Past Medical History: History reviewed. No pertinent past medical history. Past Surgical History:  Past Surgical History:  Procedure Laterality Date  . ANTERIOR CERVICAL DECOMP/DISCECTOMY FUSION N/A 07/04/2020   Procedure: ANTERIOR CERVICAL DECOMPRESSION/DISCECTOMY FUSION 1 LEVEL;  Surgeon: Venetia Night, MD;  Location: [facility] ORS;  Service: Neurosurgery;  Laterality: N/A;   HPI:  60yo female admitted 07/03/20 with increasing bilateral foot/leg pain, frequent falls. MRI: central cord syndrome, nicotine dependence, polysubstance abuse, alcohol abuse. MRI = large central disc protrusion at C4-C5, severe spinal canal stenosis, compressive myelopathy. ACDF 07/04/20. Intubated during surgery.   Assessment / Plan / Recommendation Clinical Impression  Primary esophageal dysphagia is suspected, however, anticipated post-op swelling  may also be contributing to difficulty. Pt does not report surgical pain or sore throat. She is missing dentition, and remaining teeth are in poor condition. Pt reports history of GERD, for which she takes a PPI daily, however, following surgery she indicates an increase in phlegm which she must spit out. Pt accepted trials of thin liquid, puree, and solid textures. No overt s/s aspiration were observed on any consistency given, however, pt is at increased risk for aspiration of reflux material. Pt demonstrates significant BUE weakness, and would benefit from assistance with feeding. Given suspicion for primary esophageal dysphagia, recommend consideration of a regular barium swallow to evaluate esophageal motility. Pt was given written behavioral and dietary strategies for management of esophageal dysphagia. SLP will  follow to assess diet tolerance and continue education. RN and MD informed.  SLP Visit Diagnosis: Dysphagia, unspecified (R13.10)    Aspiration Risk  Mild aspiration risk    Diet Recommendation Dysphagia 2 (Fine chop);Thin liquid   Liquid Administration via: Cup;Straw Medication Administration: Other (Comment) (as tolerated) Supervision: Staff to assist with self feeding;Full supervision/cueing for compensatory strategies Compensations: Minimize environmental distractions;Slow rate;Small sips/bites Postural Changes: Seated upright at 90 degrees;Remain upright for at least 30 minutes after po intake    Other  Recommendations Recommended Consults: Consider esophageal assessment Oral Care Recommendations: Oral care BID   Follow up Recommendations Other (comment) (tbd)      Frequency and Duration min 1 x/week  1 week;2 weeks       Prognosis Prognosis for Safe Diet Advancement: Fair      Swallow Study   General Date of Onset: 07/03/20 HPI: 60yo female admitted 07/03/20 with increasing bilateral foot/leg pain, frequent falls. MRI: central cord syndrome, nicotine dependence, polysubstance abuse, alcohol abuse. MRI = large central disc protrusion at C4-C5, severe spinal canal stenosis, compressive myelopathy. ACDF 07/04/20. Intubated during surgery. Type of Study: Bedside Swallow Evaluation Previous Swallow Assessment: none Diet Prior to this Study: Thin liquids;Regular Temperature Spikes Noted: No Respiratory Status: Room air History of Recent Intubation: Yes Length of Intubations (days): 1 days Date extubated: 07/04/20 Behavior/Cognition: Alert;Cooperative;Pleasant mood Oral Cavity Assessment: Within Functional Limits Oral Care Completed by SLP: No Oral Cavity - Dentition: Poor condition;Missing dentition Vision: Functional for self-feeding Self-Feeding Abilities: Able to feed self Patient Positioning: Upright in chair Baseline Vocal Quality: Normal Volitional Cough:  Weak Volitional Swallow: Able to elicit    Oral/Motor/Sensory Function Overall Oral Motor/Sensory Function: Within functional limits   Ice Chips Ice chips: Not tested   Thin Liquid Thin Liquid: Within functional limits Presentation: Straw  Nectar Thick Nectar Thick Liquid: Not tested   Honey Thick Honey Thick Liquid: Not tested   Puree Puree: Within functional limits Presentation: Self Fed;Spoon   Solid     Solid: Within functional limits Presentation: Self Fed Other Comments: Pt has significant BUE weakness, and benefits from assistance with self feeding.     Smrithi Pigford B. Murvin Natal [facility], CCC-SLP Speech Language Pathologist [phone]  Leigh Aurora 07/06/2020,11:44 AM

## 2020-07-06 NOTE — Progress Notes (Signed)
PHARMACY CONSULT NOTE - FOLLOW UP  Pharmacy Consult for Electrolyte Monitoring and Replacement   Recent Labs: Potassium (mmol/L)  Date Value  07/04/2020 3.4 (L)   Magnesium (mg/dL)  Date Value  02/40/9735 1.5 (L)   Calcium (mg/dL)  Date Value  32/99/2426 9.2   Albumin (g/dL)  Date Value  83/41/9622 3.0 (L)   Sodium (mmol/L)  Date Value  07/04/2020 137    Assessment: 60 yo female admitted with central cord syndrome. Pharmacy consulted for electrolyte replacement.   Goal of Therapy:  Electrolytes WNL   Plan:  BMET and magnesium ordered for 2/12 AM. Will follow up on AM labs 2/12   Clovia Cuff, PharmD, BCPS 07/06/2020 10:41 AM

## 2020-07-06 NOTE — Plan of Care (Signed)

## 2020-07-06 NOTE — Progress Notes (Signed)
Physical Therapy Treatment Patient Details Name: Yvette Phillips MRN: [id number] DOB: 01-21-1961 Today's Date: 07/06/2020    History of Present Illness Pt is a 60 y.o. female presenting to hospital 07/03/20.  Per MD H&P: "Patient explains that on Christmas eve she fell in her apartment and since then she suddenly experienced severe weakness of her both arms and both legs.  At that time, patient eventually brought by EMS to [facility] on 12/27 and was hospitalized until 1/1.  During that hospitalization patient was diagnosed with central cord syndrome as well as an incidental diagnosis of mild COVID-19.  Patient was discharged home on 1/1 with outpatient neurosurgery follow-up for outpatient surgical intervention.  Since that time, patient has experienced progressively worsening weakness of the arms and legs.  Patient explains that she is basically "rolling around" her apartment to get around.  She has been inconsistently receiving her meals from her significant other.  After once again being unable to get up off the ground for several days, patient was once again found down after a well check and was brought into [facility] emergency department for evaluation".  Imaging negative for acute fx L knee.  MRI brain negative for acute intracranial abnormality.  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  Pt s/p ACDF C4-5 07/04/20.  Per chart, no pertinent PMH.    PT Comments    Pt resting in bed upon PT arrival; pt reporting difficulty swallowing this morning so unable to eat breakfast (pt spitting breakfast back into cup); pt also reporting spitting up clear phlegm this morning (MD notified).  Utilized walker hand splints B on walker due to impaired hand grip strength.  Pt able to stand up to walker 2x's with 2 assist; required cueing for  upright posture.  Then pt able to take steps (sidestepping more towards R bed to recliner) with RW (L knee flexed requiring intermittent cueing to straighten).  After pt got to recliner pt reporting feeling like she might need to toilet (BM) so performed stand pivot recliner to/from [facility] with 2 assist (pt unable to have bowel movement on BSC though).  Pt positioned comfortably in recliner end of session.  Pt requesting therapist help her eat a little of oatmeal so therapist assisted pt with eating some oatmeal (therapist gave pt small bites of oatmeal at a time and no coughing noted; therapist also instructed pt in taking small sips of drink for safety).  MD notified regarding pt's reports of difficulty swallowing this morning.  Will continue to focus on strengthening and progressive functional mobility during hospitalization.  Pt continues to be very motivated to participate in therapy and improve functional status.   Follow Up Recommendations  CIR     Equipment Recommendations  Other (comment) (TBD at next facility)    Recommendations for Other Services OT consult     Precautions / Restrictions Precautions Precautions: Fall;Cervical Precaution Comments: s/p ACDF C4-5 (per secure messaging with MD Myer Haff 07/05/20--no cervical brace needed) Restrictions Weight Bearing Restrictions: No    Mobility  Bed Mobility Overal bed mobility: Needs Assistance Bed Mobility: Rolling;Sidelying to Sit Rolling: Min assist;Mod assist (logrolling to R side) Sidelying to sit: Max assist;+2 for safety/equipment (trunk and B LE assist)       General bed mobility comments: vc's and tactile cues for technique and spinal precautions; increased time to perform  Transfers Overall transfer level: Needs assistance Equipment used: Rolling walker (2 wheeled) Transfers: Sit to/from [employer]   Stand pivot transfers: Mod assist;+2 physical assistance (stand pivot recliner to/from [facility])        General transfer comment: x2 trials standing from bed at lowest height; vc's and tactile cues for UE/LE placement; assist to initiate and come to full stand; vc's for upright posture required d/t flexed posture; assist to control descent sitting  Ambulation/Gait Ambulation/Gait assistance: Min assist;Mod assist;+2 physical assistance Gait Distance (Feet): 3 Feet (bed to recliner) Assistive device: Rolling walker (2 wheeled)   Gait velocity: decreased   General Gait Details: L knee flexed more than R knee in standing (tactile cues to straighten L knee intermittently); vc's for taking steps; assist to steady and walker management   Stairs             Wheelchair Mobility    Modified Rankin (Stroke Patients Only)       Balance Overall balance assessment: Needs assistance Sitting-balance support: No upper extremity supported;Feet supported Sitting balance-Leahy Scale: Fair Sitting balance - Comments: steady static sitting (once positioned safely in sitting) but requiring at least single UE support for reaching within BOS   Standing balance support: Bilateral upper extremity supported Standing balance-Leahy Scale: Poor Standing balance comment: pt requiring B UE support on RW and 2 assist for safety standing (pt not bracing LE's against bed)                            Cognition Arousal/Alertness: Awake/alert Behavior During Therapy: WFL for tasks assessed/performed Overall Cognitive Status: Within Functional Limits for tasks assessed                                        Exercises Total Joint Exercises Heel Slides: AAROM;Strengthening;Both;10 reps;Supine    General Comments   Nursing cleared pt for participation in physical therapy.  Pt agreeable to PT session.      Pertinent Vitals/Pain Pain Assessment: No/denies pain Pain Score: 0-No pain Pain Location: posterior neck pain Pain Descriptors / Indicators: Sore Pain Intervention(s):  Limited activity within patient's tolerance;Monitored during session;Repositioned  Vitals (HR and O2 on room air) stable and WFL throughout treatment session.    Home Living                      Prior Function            PT Goals (current goals can now be found in the care plan section) Acute Rehab PT Goals Patient Stated Goal: to improve strength and mobility PT Goal Formulation: With patient Time For Goal Achievement: 07/19/20 Potential to Achieve Goals: Good Progress towards PT goals: Progressing toward goals    Frequency    BID      PT Plan Current plan remains appropriate    Co-evaluation              AM-PAC PT "6 Clicks" Mobility   Outcome Measure  Help needed turning from your back to your side while in a flat bed without using bedrails?: A Little Help needed moving from lying on your back to sitting on the side of a flat bed without using bedrails?: A Lot Help needed moving to and from a bed to a chair (including a wheelchair)?: Total Help needed standing up from a  chair using your arms (e.g., wheelchair or bedside chair)?: Total Help needed to walk in hospital room?: Total Help needed climbing 3-5 steps with a railing? : Total 6 Click Score: 9    End of Session Equipment Utilized During Treatment: Gait belt Activity Tolerance: Patient tolerated treatment well Patient left: in chair;with call bell/phone within reach;with chair alarm set;Other (comment) (B heels floating via pillow support) Nurse Communication: Mobility status;Precautions PT Visit Diagnosis: Other abnormalities of gait and mobility (R26.89);Unsteadiness on feet (R26.81);Muscle weakness (generalized) (M62.81);History of falling (Z91.81);Difficulty in walking, not elsewhere classified (R26.2)     Time: 4782-9562 PT Time Calculation (min) (ACUTE ONLY): 55 min  Charges:  $Gait Training: 8-22 mins $Therapeutic Exercise: 8-22 mins $Therapeutic Activity: 23-37 mins                     Hendricks Limes, PT 07/06/20, 12:06 PM

## 2020-07-06 NOTE — Progress Notes (Signed)
Occupational Therapy Treatment Patient Details Name: Yvette Phillips MRN: [id number] DOB: 04-22-1961 Today's Date: 07/06/2020    History of present illness Pt is a 60 y.o. female presenting to hospital 07/03/20.  Per MD H&P: "Patient explains that on Christmas eve she fell in her apartment and since then she suddenly experienced severe weakness of her both arms and both legs.  At that time, patient eventually brought by EMS to [facility] on 12/27 and was hospitalized until 1/1.  During that hospitalization patient was diagnosed with central cord syndrome as well as an incidental diagnosis of mild COVID-19.  Patient was discharged home on 1/1 with outpatient neurosurgery follow-up for outpatient surgical intervention.  Since that time, patient has experienced progressively worsening weakness of the arms and legs.  Patient explains that she is basically "rolling around" her apartment to get around.  She has been inconsistently receiving her meals from her significant other.  After once again being unable to get up off the ground for several days, patient was once again found down after a well check and was brought into [facility] emergency department for evaluation".  Imaging negative for acute fx L knee.  MRI brain negative for acute intracranial abnormality.  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  Pt s/p ACDF C4-5 07/04/20.  Per chart, no pertinent PMH.   OT comments  Yvette Phillips was seen for OT/PT co-treatment on this date, units split accordingly. Upon arrival to room pt awake/alert, seated at EOB with PT in room for session. Pt continues to require +2 assist for STS attempts. She is able to perform STS x3 with assist as described below. She is also able to side-step at EOB with consistent multimodal cueing and +2 MOD A. Pt eager  to participate t/o session and puts forth excellent effort with functional tasks. She is intermittently tearful when discussing importance of self-advocacy and healthcare management. Pt reports she is very used to caring for other people, and feels it is difficult to request assistance for herself. Pt encouraged to make her needs known, in consideration of new limitations and needs 2/2 her SCI. Pt eager to trial built up handle and universal cuff to support her safety and independence with self-feeding this date. She verbalizes preference for use of built up handle for feeding and is able to return verbalize/demonstrate safe use with a spoon to scoop ice chips from a cup. Pt educated on strategies to involve BUE in self-feeding/grooming tasks and encouraged to participate in her care as much as possible while acutely hospitalized. Pt verbalized understanding instruction provided. Pt making excellent progress toward goals and continues to benefit from skilled OT services to maximize return to PLOF and minimize risk of future falls, injury, caregiver burden, and readmission. Will continue to follow POC as written. Discharge recommendation for acute inpatient rehabilitation remains appropriate.    Follow Up Recommendations  CIR    Equipment Recommendations       Recommendations for Other Services      Precautions / Restrictions Precautions Precautions: Fall;Cervical Precaution Comments: s/p ACDF C4-5 (per secure messaging with MD Myer Haff 07/05/20--no cervical brace needed) Restrictions Weight Bearing Restrictions: No       Mobility Bed Mobility Overal bed mobility: Needs Assistance Bed Mobility: Sit to Sidelying;Rolling Rolling: Min assist;Mod assist Sidelying to sit: Max assist;HOB elevated (assist for trunk and B LE's)  Sit to supine: Mod assist;+2 for safety/equipment;HOB elevated (assist for LE's R sidelying to supine) Sit to sidelying: Mod assist;+2 for physical assistance General bed  mobility comments: vc's and tactile cues for technique and logrolling; increased time to perform  Transfers Overall transfer level: Needs assistance Equipment used: Rolling walker (2 wheeled) Transfers: Sit to/from Stand Sit to Stand: Min assist;Mod assist;+2 physical assistance Stand pivot transfers: Mod assist;+2 physical assistance       General transfer comment: x3 trials standing up to walker with vc's and tactile cues for UE and LE placement (prior to standing--min assist to scoot towards edge of bed with vc's and tactile cues for techniqe); assist to initiate and come to full stand; assist to control descent sitting; bed in lowest position. Mod +2 assist for side-stepping at EOB.    Balance Overall balance assessment: Needs assistance Sitting-balance support: No upper extremity supported;Feet supported Sitting balance-Leahy Scale: Good Sitting balance - Comments: steady sitting with at least single UE support   Standing balance support: Bilateral upper extremity supported Standing balance-Leahy Scale: Poor Standing balance comment: pt requiring B UE support on RW and 2 assist for safety standing (pt not bracing LE's against bed)                           ADL either performed or assessed with clinical judgement   ADL Overall ADL's : Needs assistance/impaired Eating/Feeding: Set up;With adaptive utensils;Cueing for safety;Cueing for sequencing Eating/Feeding Details (indicate cue type and reason): Pt provided with built-up handles x2 to support functional independence with self-feeding. She is able to return demonstrate understanding of safe use of built up handle using a spoon to scoop ice cubes x10. Pt able to return demo understanding for how to apply handles to utensils with min cueing for technique for use of her LUE. Pt demos a refined tripod grasp when using built up handle and spoon in her RUE. She is eager to trial at her next meal time.   Grooming Details  (indicate cue type and reason): Pt educated on safe use of built up handle for grooming tasks as well as self-feeding this date.                             Functional mobility during ADLs: +2 for physical assistance;Cueing for sequencing;Rolling walker       Vision Patient Visual Report: No change from baseline     Perception     Praxis      Cognition Arousal/Alertness: Awake/alert Behavior During Therapy: WFL for tasks assessed/performed Overall Cognitive Status: Within Functional Limits for tasks assessed                                          Exercises Total Joint Exercises Ankle Circles/Pumps: AAROM;Strengthening;Both;10 reps;Supine Long Arc Quad: AROM;Strengthening;Both;10 reps;Seated (decreased L LAQ ROM compared to R) Marching in Standing: AROM;Strengthening;Both;5 reps;Standing (2 assist for balance; pt with difficulty clearing feet from floor L more than R) General Exercises - Lower Extremity Hip Flexion/Marching: AROM;Right;AAROM;Left;Strengthening;10 reps;Seated Other Exercises Other Exercises: OT facilitates reinforcement of prior education on role of OT in acute setting as well as safe use of AE to support functional independence with fine motor tasks. Pt trials built up handle and U-cuff, with good success for self-feeding using built up handle. See  ADL section for additional details.   Shoulder Instructions       General Comments      Pertinent Vitals/ Pain       Pain Assessment: No/denies pain Faces Pain Scale: No hurt Pain Intervention(s): Limited activity within patient's tolerance;Monitored during session;Repositioned  Home Living                                          Prior Functioning/Environment              Frequency  Min 3X/week        Progress Toward Goals  OT Goals(current goals can now be found in the care plan section)  Progress towards OT goals: Progressing toward  goals  Acute Rehab OT Goals Patient Stated Goal: to improve strength and mobility OT Goal Formulation: With patient Time For Goal Achievement: 07/19/20 Potential to Achieve Goals: Good  Plan Discharge plan remains appropriate;Frequency remains appropriate    Co-evaluation    PT/OT/SLP Co-Evaluation/Treatment: Yes Reason for Co-Treatment: Complexity of the patient's impairments (multi-system involvement);Necessary to address cognition/behavior during functional activity;To address functional/ADL transfers PT goals addressed during session: Mobility/safety with mobility;Proper use of DME;Balance OT goals addressed during session: ADL's and self-care;Proper use of Adaptive equipment and DME      AM-PAC OT "6 Clicks" Daily Activity     Outcome Measure   Help from another person eating meals?: A Little Help from another person taking care of personal grooming?: A Little Help from another person toileting, which includes using toliet, bedpan, or urinal?: A Lot Help from another person bathing (including washing, rinsing, drying)?: A Lot Help from another person to put on and taking off regular upper body clothing?: A Lot Help from another person to put on and taking off regular lower body clothing?: A Lot 6 Click Score: 14    End of Session Equipment Utilized During Treatment: Gait belt;Rolling walker  OT Visit Diagnosis: Unsteadiness on feet (R26.81);Muscle weakness (generalized) (M62.81);Repeated falls (R29.6);Hemiplegia and hemiparesis;Other symptoms and signs involving the nervous system (R29.898) Hemiplegia - Right/Left:  (B/l)   Activity Tolerance Patient tolerated treatment well   Patient Left in bed;with call bell/phone within reach;Other (comment);with nursing/sitter in room;with bed alarm set   Nurse Communication Other (comment) (Strategies for built up handle.)        Time: 1440-1530 OT Time Calculation (min): 50 min  Charges: OT General Charges $OT Visit: 1  Visit OT Treatments $Self Care/Home Management : 23-37 mins  Rockney Ghee, M.S., OTR/L Ascom: [phone] 07/06/20, 4:23 PM

## 2020-07-06 NOTE — Progress Notes (Addendum)
[facility]  - [place] at [facility]   PATIENT NAME: Yvette Phillips    MR#:  [id number]  DATE OF BIRTH:  April 17, 1961  SUBJECTIVE:   Working with physical therapy. Denies much pain. Overall slowly improving. Sister in the room. Staff noted patient having some difficulty with swallowing.-Coughing up clear phlegm. REVIEW OF SYSTEMS:   Review of Systems  Constitutional: Negative for chills, fever and weight loss.  HENT: Negative for ear discharge, ear pain and nosebleeds.   Eyes: Negative for blurred vision, pain and discharge.  Respiratory: Positive for cough. Negative for sputum production, shortness of breath, wheezing and stridor.   Cardiovascular: Negative for chest pain, palpitations, orthopnea and PND.  Gastrointestinal: Negative for abdominal pain, diarrhea, nausea and vomiting.       Dysphagia  Genitourinary: Negative for frequency and urgency.  Musculoskeletal: Negative for back pain.  Neurological: Positive for weakness. Negative for sensory change, speech change and focal weakness.  Psychiatric/Behavioral: Negative for depression and hallucinations. The patient is not nervous/anxious.    Tolerating Diet:yesTolerating PT: CIR  DRUG ALLERGIES:   Allergies  Allergen Reactions  . Codeine Anaphylaxis  . Penicillins Anaphylaxis    VITALS:  Blood pressure 118/79, pulse 96, temperature 98 F (36.7 C), resp. rate 16, height 5\' 7"  (1.702 m), weight 104.3 kg, SpO2 97 %.  PHYSICAL EXAMINATION:   Physical Exam  GENERAL:  60 y.o.-year-old patient lying in the bed with no acute distress.  LUNGS: Normal breath sounds bilaterally, no wheezing, rales, rhonchi. No use of accessory muscles of respiration.  CARDIOVASCULAR: S1, S2 normal. No murmurs, rubs, or gallops.  ABDOMEN: Soft, nontender, nondistended. Bowel sounds present. No organomegaly or mass.  EXTREMITIES: No cyanosis, clubbing or edema b/l.    NEUROLOGIC: Cranial nerves II through XII are intact. bilateral  weakness LE PSYCHIATRIC:  patient is alert and oriented x 3.  SKIN:  Pressure Injury 07/04/20 Buttocks Left Stage 2 -  Partial thickness loss of dermis presenting as a shallow open injury with a red, pink wound bed without slough. (Active)  07/04/20 0030  Location: Buttocks  Location Orientation: Left  Staging: Stage 2 -  Partial thickness loss of dermis presenting as a shallow open injury with a red, pink wound bed without slough.  Wound Description (Comments):   Present on Admission: Yes        LABORATORY PANEL:  CBC Recent Labs  Lab 07/04/20 0519  WBC 7.5  HGB 13.4  HCT 39.7  PLT 263    Chemistries  Recent Labs  Lab 07/04/20 0519  NA 137  K 3.4*  CL 100  CO2 26  GLUCOSE 83  BUN 11  CREATININE 0.83  CALCIUM 9.2  MG 1.5*  AST 22  ALT 13  ALKPHOS 53  BILITOT 1.3*   Cardiac Enzymes No results for input(s): TROPONINI in the last 168 hours. RADIOLOGY:  DG Cervical Spine 2 or 3 views  Result Date: 07/05/2020 CLINICAL DATA:  60 year old female status post cervical fusion. EXAM: CERVICAL SPINE - 2-3 VIEW COMPARISON:  Cervical spine radiograph dated 07/04/2020. FINDINGS: C4-C5 disc spacer and anterior fusion. There is no acute fracture or subluxation. The visualized posterior elements and odontoid appear intact. There is anatomic alignment of the lateral masses of C1 and C2. Anterior paraspinal soft tissue swelling with pockets of air, postsurgical. IMPRESSION: Status post C4-C5 disc spacer and anterior fusion. No acute fracture or subluxation. Electronically Signed   By: 09/01/2020 M.D.   On: 07/05/2020 19:34   DG  Cervical Spine 2-3 Views  Result Date: 07/04/2020 CLINICAL DATA:  Surgery, elective. Provided fluoroscopy time: 3 seconds. EXAM: CERVICAL SPINE - 2-3 VIEW; DG C-ARM 1-60 MIN COMPARISON:  Cervical spine MRI 07/03/2020. FINDINGS: PA and lateral view intraoperative fluoroscopic images of the cervical spine are submitted, 3 images total. On the final images,  ACDF hardware is present at the C4-C5 level. No unexpected finding. Partially visualized ET tube. A surgical sponge/packing material projects ventral to the ACDF hardware on the image taken at 5:21 p.m., but this is no longer present at time of the PA fluoroscopic image taken at 5:22 p.m. IMPRESSION: Three intraoperative fluoroscopic images of the cervical spine from C4-C5 ACDF, as described. Electronically Signed   By: Jackey Loge DO   On: 07/04/2020 17:54   DG C-Arm 1-60 Min  Result Date: 07/04/2020 CLINICAL DATA:  Surgery, elective. Provided fluoroscopy time: 3 seconds. EXAM: DG C-ARM 1-60 MIN COMPARISON:  Cervical spine MRI 07/03/2020. FINDINGS: PA and lateral view intraoperative fluoroscopic images of the cervical spine are submitted, 3 images total. On the final images, ACDF hardware is present at the C4-C5 level. No unexpected finding. Partially visualized ET tube. A surgical sponge/packing material projects ventral to the ACDF hardware on the image taken at 5:21 p.m., but this is no longer present at time of the PA fluoroscopic image taken at 5:22 p.m. IMPRESSION: Three intraoperative fluoroscopic images of the cervical spine from C4-C5 ACDF, as described. Electronically Signed   By: Jackey Loge DO   On: 07/04/2020 18:17   DG C-Arm 1-60 Min  Result Date: 07/04/2020 CLINICAL DATA:  Surgery, elective. Provided fluoroscopy time: 3 seconds. EXAM: CERVICAL SPINE - 2-3 VIEW; DG C-ARM 1-60 MIN COMPARISON:  Cervical spine MRI 07/03/2020. FINDINGS: PA and lateral view intraoperative fluoroscopic images of the cervical spine are submitted, 3 images total. On the final images, ACDF hardware is present at the C4-C5 level. No unexpected finding. Partially visualized ET tube. A surgical sponge/packing material projects ventral to the ACDF hardware on the image taken at 5:21 p.m., but this is no longer present at time of the PA fluoroscopic image taken at 5:22 p.m. IMPRESSION: Three intraoperative fluoroscopic  images of the cervical spine from C4-C5 ACDF, as described. Electronically Signed   By: Jackey Loge DO   On: 07/04/2020 17:54   ASSESSMENT AND PLAN:  Yvette Phillips is a 60 y.o. female presents to the ER via EMS after DSS can evaluate patient for on the floor.  Is having frequent falls not able to care for her himself as she lives at home alone.  Patient was admitted to [facility] in early January found to have Covid as well as MRI suggesting central cord syndrome.  Was evaluate by neurosurgery and discharged home for outpatient follow-up.  Large disc herniation at C4-5, Central cord syndrome with bilateral upper and lower extremity weakness:  --07/04/20-- status post anterior cervical decompression/discectomy by Dr Marcell Barlow -- physical therapy and Dr Myer Haff Recommends CIR/acute rehab --2/10-- patient took a few steps with PT today.  Dysphagia -- seen by speech therapy. Appears primarily esophageal suspected due to postop inflammation. -- Will order barium swallow as per recommendation  Hypokalemia and hypomagnesemia: Replete potassium and magnesium per pharmacy protocol  Polysubstance abuse (cocaine, cannabinoids, alcohol, tobacco): She has been counseled to quit.  Ativan as needed for alcohol withdrawal syndrome.  Elevated blood pressure without diagnosis of hypertension -- PRN IV hydralazine. Will continue to monitor -BP stable  Pressure Injury 07/04/20 Buttocks Left Stage 2 -  Partial thickness loss of dermis presenting as a shallow open injury with a red, pink wound bed without slough. (Active)  07/04/20 0030  Location: Buttocks  Location Orientation: Left  Staging: Stage 2 -  Partial thickness loss of dermis presenting as a shallow open injury with a red, pink wound bed without slough.  Wound Description (Comments):   Present on Admission: Yes    Procedures: anterior cervical decompression/discectomy Family communication : sister Alona Bene in the room Consults : neurosurgery CODE  STATUS: full code DVT Prophylaxis : heparin Level of care: Med-Surg Status is: Inpatient  Remains inpatient appropriate because:Unsafe d/c plan   Dispo: The patient is from: Home              Anticipated d/c is to: CIR              Anticipated d/c date is:TBD              Patient currently is medically stable to d/c.   Difficult to place patient No  Physical therapy recommends CIR. Awaiting decision from [facility] cone CIR. Will continue PT while patient is in house.       TOTAL TIME TAKING CARE OF THIS PATIENT: 25 minutes.  >50% time spent on counselling and coordination of care  Note: This dictation was prepared with Dragon dictation along with smaller phrase technology. Any transcriptional errors that result from this process are unintentional.  Enedina Finner M.D    [facility]   CC: Primary care physician; Patient, No Pcp PerPatient ID: Wendelyn Breslow, female   DOB: 10-03-1960, 60 y.o.   MRN: [id number]

## 2020-07-07 LAB — MAGNESIUM: Magnesium: 1.9 mg/dL (ref 1.7–2.4)

## 2020-07-07 LAB — BASIC METABOLIC PANEL
Anion gap: 11 (ref 5–15)
BUN: 9 mg/dL (ref 8–23)
CO2: 27 mmol/L (ref 22–32)
Calcium: 9.3 mg/dL (ref 8.9–10.3)
Chloride: 99 mmol/L (ref 98–111)
Creatinine, Ser: 0.75 mg/dL (ref 0.44–1.00)
GFR, Estimated: >60 mL/min (ref >60)
Glucose, Bld: 89 mg/dL (ref 70–99)
Potassium: 4.2 mmol/L (ref 3.5–5.1)
Sodium: 137 mmol/L (ref 135–145)

## 2020-07-07 NOTE — Progress Notes (Signed)
Inpatient Rehab Admissions:  Inpatient Rehab Consult received.  I spoke with patient via telephone for rehabilitation assessment and to discuss goals and expectations of an inpatient rehab admission.  Pt acknowledged understanding of goals and expectations. Pt interested in pursuing CIR.  Pt mentioned that sister, Alona Bene and boyfriend, Marcial Pacas would be available to assist her after discharge.  Pt gave permission to contact both.  Left message on voicemail for Alona Bene.  Able to speak with Timothy via phone. He is interested in CIR for pt and he acknowledged that he would be available to assist pt after discharge.  Will continue to follow.  Signed: Wolfgang Phoenix, MS, CCC-SLP Admissions Coordinator [phone]

## 2020-07-07 NOTE — Progress Notes (Signed)
Physical Therapy Treatment Patient Details Name: Yvette Phillips MRN: [id number] DOB: 1960-08-16 Today's Date: 07/07/2020    History of Present Illness Pt is a 60 y.o. female presenting to hospital 07/03/20.  Per MD H&P: "Patient explains that on Christmas eve she fell in her apartment and since then she suddenly experienced severe weakness of her both arms and both legs.  At that time, patient eventually brought by EMS to [facility] on 12/27 and was hospitalized until 1/1.  During that hospitalization patient was diagnosed with central cord syndrome as well as an incidental diagnosis of mild COVID-19.  Patient was discharged home on 1/1 with outpatient neurosurgery follow-up for outpatient surgical intervention.  Since that time, patient has experienced progressively worsening weakness of the arms and legs.  Patient explains that she is basically "rolling around" her apartment to get around.  She has been inconsistently receiving her meals from her significant other.  After once again being unable to get up off the ground for several days, patient was once again found down after a well check and was brought into [facility] emergency department for evaluation".  Imaging negative for acute fx L knee.  MRI brain negative for acute intracranial abnormality.  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  Pt s/p ACDF C4-5 07/04/20.  Per chart, no pertinent PMH.    PT Comments    Pt was long sitting in bed. Very eager to participate and highly motivated. She is A and O. Was able to exit R sid eof bed with max assist. Stood from elevated bed height with mod-max assist to platform RW. Was able to take 3 steps to [facility] to have BM. After BM, +2 assist required to stand and ambulate 5 ft. Max vcs throughout for LE advancement, poture corrections and  wt shifting. Pt does fatigue quickly with gait trial. She required assistance repositioning hip back into recliner for safety. RN aware of pt's BM and abilities. Will return later in day for PM session to advance gait trials. Highly recommend CIR to assist pt to PLOF.    Follow Up Recommendations  CIR     Equipment Recommendations  Other (comment) (defer to next level; of care)    Recommendations for Other Services       Precautions / Restrictions Precautions Precautions: Fall;Cervical Precaution Comments: s/p ACDF C4-5 (per secure messaging with MD Myer Haff 07/05/20--no cervical brace needed)    Mobility  Bed Mobility Overal bed mobility: Needs Assistance Bed Mobility: Supine to Sit;Sidelying to Sit;Sit to Supine Rolling: Min assist;Mod assist Sidelying to sit: Max assist Supine to sit: Max assist Sit to supine: Max assist Sit to sidelying: Max assist General bed mobility comments: Pt was long sitting in bed upon arriving. she was able to exit R side of bed with increased time and max assist.    Transfers Overall transfer level: Needs assistance Equipment used: Bilateral platform walker Transfers: Sit to/from Stand Sit to Stand: Max assist;+2 safety/equipment;From elevated surface         General transfer comment: Pt was able to stand EOB to platform RW from elevated bed height with mod assist. from [facility] height required max assist with 2nd person assist for safety.  Ambulation/Gait Ambulation/Gait assistance: +2 physical assistance;+2 safety/equipment;Mod assist Gait Distance (Feet): 5 Feet Assistive device: Bilateral platform walker Gait Pattern/deviations: Step-to pattern Gait velocity: decreased  General Gait Details: Pt has much weaker LLE than R. protected and even advanced LLE during gait once pt was fatigue.       Balance Overall balance assessment: Needs assistance Sitting-balance support: No upper extremity supported;Feet supported Sitting  balance-Leahy Scale: Fair     Standing balance support: Bilateral upper extremity supported Standing balance-Leahy Scale: Poor Standing balance comment: high fall risk           Cognition Arousal/Alertness: Awake/alert Behavior During Therapy: WFL for tasks assessed/performed Overall Cognitive Status: Within Functional Limits for tasks assessed        General Comments: Pt is A and extremely pleasant. HIghly motivated to improve             Pertinent Vitals/Pain Pain Assessment: No/denies pain           PT Goals (current goals can now be found in the care plan section) Acute Rehab PT Goals Patient Stated Goal: Rehab then back home Progress towards PT goals: Progressing toward goals    Frequency    BID      PT Plan Current plan remains appropriate    Co-evaluation     PT goals addressed during session: Mobility/safety with mobility;Proper use of DME;Balance;Strengthening/ROM        AM-PAC PT "6 Clicks" Mobility   Outcome Measure  Help needed turning from your back to your side while in a flat bed without using bedrails?: A Little Help needed moving from lying on your back to sitting on the side of a flat bed without using bedrails?: A Lot Help needed moving to and from a bed to a chair (including a wheelchair)?: A Lot Help needed standing up from a chair using your arms (e.g., wheelchair or bedside chair)?: A Lot Help needed to walk in hospital room?: Total Help needed climbing 3-5 steps with a railing? : Total 6 Click Score: 11    End of Session Equipment Utilized During Treatment: Gait belt Activity Tolerance: Patient tolerated treatment well Patient left: in chair;with call bell/phone within reach;with chair alarm set Nurse Communication: Mobility status;Precautions PT Visit Diagnosis: Other abnormalities of gait and mobility (R26.89);Unsteadiness on feet (R26.81);Muscle weakness (generalized) (M62.81);History of falling (Z91.81);Difficulty in  walking, not elsewhere classified (R26.2)     Time: 1610-9604 PT Time Calculation (min) (ACUTE ONLY): 44 min  Charges:  $Therapeutic Activity: 38-52 mins                     Jetta Lout PTA 07/07/20, 4:54 PM

## 2020-07-07 NOTE — Progress Notes (Signed)
[facility]  - [place] at [facility]   PATIENT NAME: Yvette Phillips    MR#:  [id number]  DATE OF BIRTH:  [birth year]  SUBJECTIVE:   Working with physical therapy. Denies much pain. Overall slowly improving with UE strength Very motivated to work with PT.  REVIEW OF SYSTEMS:   Review of Systems  Constitutional: Negative for chills, fever and weight loss.  HENT: Negative for ear discharge, ear pain and nosebleeds.   Eyes: Negative for blurred vision, pain and discharge.  Respiratory: Positive for cough. Negative for sputum production, shortness of breath, wheezing and stridor.   Cardiovascular: Negative for chest pain, palpitations, orthopnea and PND.  Gastrointestinal: Negative for abdominal pain, diarrhea, nausea and vomiting.  Genitourinary: Negative for frequency and urgency.  Musculoskeletal: Negative for back pain.  Neurological: Positive for weakness. Negative for sensory change, speech change and focal weakness.  Psychiatric/Behavioral: Negative for depression and hallucinations. The patient is not nervous/anxious.    Tolerating Diet:yesTolerating PT: CIR  DRUG ALLERGIES:   Allergies  Allergen Reactions  . Codeine Anaphylaxis  . Penicillins Anaphylaxis    VITALS:  Blood pressure 134/82, pulse (!) 107, temperature 97.8 F (36.6 C), resp. rate 20, height 5\' 7"  (1.702 m), weight 104.3 kg, SpO2 96 %.  PHYSICAL EXAMINATION:   Physical Exam  GENERAL:  60 y.o.-year-old patient lying in the bed with no acute distress.  LUNGS: Normal breath sounds bilaterally, no wheezing, rales, rhonchi. No use of accessory muscles of respiration.  CARDIOVASCULAR: S1, S2 normal. No murmurs, rubs, or gallops.  ABDOMEN: Soft, nontender, nondistended. Bowel sounds present. No organomegaly or mass.  EXTREMITIES: No cyanosis, clubbing or edema b/l.    NEUROLOGIC: Cranial nerves II through XII are intact. bilateral weakness LE PSYCHIATRIC:  patient is alert and oriented x 3.   SKIN:  Pressure Injury 07/04/20 Buttocks Left Stage 2 -  Partial thickness loss of dermis presenting as a shallow open injury with a red, pink wound bed without slough. (Active)  07/04/20 0030  Location: Buttocks  Location Orientation: Left  Staging: Stage 2 -  Partial thickness loss of dermis presenting as a shallow open injury with a red, pink wound bed without slough.  Wound Description (Comments):   Present on Admission: Yes        LABORATORY PANEL:  CBC Recent Labs  Lab 07/04/20 0519  WBC 7.5  HGB 13.4  HCT 39.7  PLT 263    Chemistries  Recent Labs  Lab 07/04/20 0519 07/07/20 0504  NA 137 137  K 3.4* 4.2  CL 100 99  CO2 26 27  GLUCOSE 83 89  BUN 11 9  CREATININE 0.83 0.75  CALCIUM 9.2 9.3  MG 1.5* 1.9  AST 22  --   ALT 13  --   ALKPHOS 53  --   BILITOT 1.3*  --    Cardiac Enzymes No results for input(s): TROPONINI in the last 168 hours. RADIOLOGY:  DG Cervical Spine 2 or 3 views  Result Date: 07/05/2020 CLINICAL DATA:  60 year old female status post cervical fusion. EXAM: CERVICAL SPINE - 2-3 VIEW COMPARISON:  Cervical spine radiograph dated 07/04/2020. FINDINGS: C4-C5 disc spacer and anterior fusion. There is no acute fracture or subluxation. The visualized posterior elements and odontoid appear intact. There is anatomic alignment of the lateral masses of C1 and C2. Anterior paraspinal soft tissue swelling with pockets of air, postsurgical. IMPRESSION: Status post C4-C5 disc spacer and anterior fusion. No acute fracture or subluxation. Electronically Signed   By:  Elgie Collard M.D.   On: 07/05/2020 19:34   DG ESOPHAGUS W SINGLE CM (SOL OR THIN BA)  Result Date: 07/06/2020 CLINICAL DATA:  Cervical fusion 2 days ago.  Painful swallowing. EXAM: ESOPHAGUS/BARIUM SWALLOW/TABLET STUDY TECHNIQUE: Initial scout AP supine abdominal image obtained to insure adequate colon cleansing. Barium was introduced into the colon in a retrograde fashion and refluxed from  the rectum to the cecum. Spot images of the colon followed by overhead radiographs were obtained. FLUOROSCOPY TIME:  Fluoroscopy Time:  0.7 minute Radiation Exposure Index (if provided by the fluoroscopic device): 6.8 mGy Number of Acquired Spot Images: 0 COMPARISON:  None. FINDINGS: Normal pharyngeal anatomy and motility. Contrast flowed freely through the esophagus without evidence of a stricture or mass. Normal esophageal mucosa without evidence of irregularity or ulceration. Esophageal motility was normal. No evidence of reflux. No definite hiatal hernia was demonstrated. IMPRESSION: No esophageal stricture, mass or ulceration. Normal esophageal motility. Electronically Signed   By: Elige Ko   On: 07/06/2020 13:07   ASSESSMENT AND PLAN:  Yvette Phillips is a 60 y.o. female presents to the ER via EMS after DSS can evaluate patient for on the floor.  Is having frequent falls not able to care for her himself as she lives at home alone.  Patient was admitted to [facility] in early January found to have Covid as well as MRI suggesting central cord syndrome.  Was evaluate by neurosurgery and discharged home for outpatient follow-up.  Large disc herniation at C4-5, Central cord syndrome with bilateral upper and lower extremity weakness:  -POD # 3 --07/04/20-- status post anterior cervical decompression/discectomy by Dr Marcell Barlow -- physical therapy and Dr Myer Haff Recommends CIR/acute rehab --2/10-- patient took a few steps with PT today.  Dysphagia -- seen by speech therapy. Appears primarily esophageal suspected due to postop inflammation. --  barium swallow as per recommendation--Normal esophageal motility  Hypokalemia and hypomagnesemia: Repleted  Polysubstance abuse (cocaine, cannabinoids, alcohol, tobacco): She has been counseled to quit.  Ativan as needed for alcohol withdrawal syndrome.  Elevated blood pressure without diagnosis of hypertension -- PRN IV hydralazine. Will continue to monitor -BP  stable  Pressure Injury 07/04/20 Buttocks Left Stage 2 -  Partial thickness loss of dermis presenting as a shallow open injury with a red, pink wound bed without slough. (Active)  07/04/20 0030  Location: Buttocks  Location Orientation: Left  Staging: Stage 2 -  Partial thickness loss of dermis presenting as a shallow open injury with a red, pink wound bed without slough.  Wound Description (Comments):   Present on Admission: Yes    Procedures: anterior cervical decompression/discectomy Family communication : sister Alona Bene  Consults : neurosurgery CODE STATUS: full code DVT Prophylaxis : heparin Level of care: Med-Surg Status is: Inpatient  Remains inpatient appropriate because:Unsafe d/c plan   Dispo: The patient is from: Home              Anticipated d/c is to: CIR              Anticipated d/c date is:TBD              Patient currently is medically stable to d/c.   Difficult to place patient No patient is status post day 3 cervical fusion/disectomy. Physical therapy/Neurosx  recommends CIR. Awaiting decision from [facility] cone CIR. Will continue PT while patient is in house.       TOTAL TIME TAKING CARE OF THIS PATIENT: 25 minutes.  >50% time spent on counselling  and coordination of care  Note: This dictation was prepared with Dragon dictation along with smaller phrase technology. Any transcriptional errors that result from this process are unintentional.  Enedina Finner M.D    [facility]   CC: Primary care physician; Patient, No Pcp PerPatient ID: Wendelyn Breslow, female   DOB: 05-26-1961, 60 y.o.   MRN: [id number]

## 2020-07-07 NOTE — Progress Notes (Signed)
Physical Therapy Treatment Patient Details Name: Yvette Phillips MRN: [id number] DOB: 04/10/1961 Today's Date: 07/07/2020    History of Present Illness Pt is a 60 y.o. female presenting to hospital 07/03/20.  Per MD H&P: "Patient explains that on Christmas eve she fell in her apartment and since then she suddenly experienced severe weakness of her both arms and both legs.  At that time, patient eventually brought by EMS to [facility] on 12/27 and was hospitalized until 1/1.  During that hospitalization patient was diagnosed with central cord syndrome as well as an incidental diagnosis of mild COVID-19.  Patient was discharged home on 1/1 with outpatient neurosurgery follow-up for outpatient surgical intervention.  Since that time, patient has experienced progressively worsening weakness of the arms and legs.  Patient explains that she is basically "rolling around" her apartment to get around.  She has been inconsistently receiving her meals from her significant other.  After once again being unable to get up off the ground for several days, patient was once again found down after a well check and was brought into [facility] emergency department for evaluation".  Imaging negative for acute fx L knee.  MRI brain negative for acute intracranial abnormality.  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  Pt s/p ACDF C4-5 07/04/20.  Per chart, no pertinent PMH.    PT Comments    Pt still sitting in recliner from AM session. She agrees to PT session and is eager to improve ambulation. Pushed recliner into hallway so pt could ambulate in straight course. She required max assist +1 to stand from recliner. Did have chair follow throughout gait training for safety. She fatigues quickly and once fatigued required total assist progressing LLE during  steps. She was able to advance gait distances to 10 ft. After three trials of ambulation, pt returned to bed with RN staff aware of abilities and bed alarm in place. Pt will greatly benefit from CIR at [place] to address deficits while assisting pt to PLOF. Acute PT will continue to follow and progress as able per POC.    Follow Up Recommendations  CIR     Equipment Recommendations  Other (comment)    Recommendations for Other Services       Precautions / Restrictions Precautions Precautions: Fall;Cervical Precaution Comments: s/p ACDF C4-5 (per secure messaging with MD Myer Haff 07/05/20--no cervical brace needed) Restrictions Weight Bearing Restrictions: No    Mobility  Bed Mobility Overal bed mobility: Needs Assistance Bed Mobility: Supine to Sit;Sidelying to Sit;Sit to Supine Rolling: Min assist;Mod assist Sidelying to sit: Max assist Supine to sit: Max assist Sit to supine: Max assist;+2 for safety/equipment Sit to sidelying: +2 for safety/equipment;Max assist General bed mobility comments: Pt very fatigued after gait training. required max assist to return to supine with 2nd person for safety    Transfers Overall transfer level: Needs assistance Equipment used: Bilateral platform walker Transfers: Sit to/from Stand Sit to Stand: Max assist;+2 safety/equipment         General transfer comment: Pt performed 3 gait trials in hallway. (pushed recliner into hallway for straight navigation for pt. She required max assist of one + max vcs for improved technique. STS 4x total from recliner surface  Ambulation/Gait Ambulation/Gait assistance: +2 safety/equipment;Max assist;Total assist Gait Distance (Feet): 10 Feet Assistive device: Bilateral platform walker Gait Pattern/deviations: Step-to pattern;Staggering left;Staggering right;Trunk  flexed;Narrow base of support;Decreased weight shift to left;Decreased step length - left;Decreased step length - right Gait velocity:  decreased   General Gait Details: Pt was able to ambulate 3 x this session. 2 x ~ 5 ft and 1 x 10 ft. using platform RW + max assist. once fatigue pt required total assist to progress LLE. does fatigue quickly but overall very pleased with progress. Close chair follow throughout.       Balance Overall balance assessment: Needs assistance Sitting-balance support: No upper extremity supported;Feet supported Sitting balance-Leahy Scale: Fair     Standing balance support: Bilateral upper extremity supported Standing balance-Leahy Scale: Poor Standing balance comment: high fall risk       Cognition Arousal/Alertness: Awake/alert Behavior During Therapy: WFL for tasks assessed/performed Overall Cognitive Status: Within Functional Limits for tasks assessed      General Comments: Pt is A and extremely pleasant. HIghly motivated to improve             Pertinent Vitals/Pain Pain Assessment: No/denies pain           PT Goals (current goals can now be found in the care plan section) Acute Rehab PT Goals Patient Stated Goal: Rehab then back home Progress towards PT goals: Progressing toward goals    Frequency    BID      PT Plan Current plan remains appropriate    Co-evaluation     PT goals addressed during session: Mobility/safety with mobility        AM-PAC PT "6 Clicks" Mobility   Outcome Measure  Help needed turning from your back to your side while in a flat bed without using bedrails?: A Little Help needed moving from lying on your back to sitting on the side of a flat bed without using bedrails?: A Lot Help needed moving to and from a bed to a chair (including a wheelchair)?: A Lot Help needed standing up from a chair using your arms (e.g., wheelchair or bedside chair)?: A Lot Help needed to walk in hospital room?: Total Help needed climbing 3-5 steps with a railing? : Total 6 Click Score: 11    End of Session Equipment Utilized During Treatment: Gait  belt Activity Tolerance: Patient tolerated treatment well Patient left: in bed;with call bell/phone within reach;with bed alarm set Nurse Communication: Mobility status;Precautions PT Visit Diagnosis: Other abnormalities of gait and mobility (R26.89);Unsteadiness on feet (R26.81);Muscle weakness (generalized) (M62.81);History of falling (Z91.81);Difficulty in walking, not elsewhere classified (R26.2)     Time: 9629-5284 PT Time Calculation (min) (ACUTE ONLY): 28 min  Charges:  $Gait Training: 23-37 mins $Therapeutic Activity: 38-52 mins                     Jetta Lout PTA 07/07/20, 5:04 PM

## 2020-07-07 NOTE — Progress Notes (Signed)
  Speech Language Pathology Treatment: Dysphagia  Patient Details Name: Yvette Phillips MRN: [id number] DOB: 1960-07-02 Today's Date: 07/07/2020 Time: 7225-7505 SLP Time Calculation (min) (ACUTE ONLY): 15 min  Assessment / Plan / Recommendation Clinical Impression  Skilled treatment session focused on completing education and assessing continue toleration of current diet. Pt currently consuming dysphagia 2 diet and thin liquids via straw at bedside without any overt s/s of aspiration. She voiced understanding of possible edema following ACDF. Given potential edema and current condition of pt's dentition, am cautious with further upgrade at this time. Pt is in agreement and further upgrade can be deferred to next venue of care.    HPI HPI: 60yo female admitted 07/03/20 with increasing bilateral foot/leg pain, frequent falls. MRI: central cord syndrome, nicotine dependence, polysubstance abuse, alcohol abuse. MRI = large central disc protrusion at C4-C5, severe spinal canal stenosis, compressive myelopathy. ACDF 07/04/20. Intubated during surgery.      SLP Plan  All goals met       Recommendations  Diet recommendations: Dysphagia 2 (fine chop);Thin liquid Liquids provided via: Straw Medication Administration: Whole meds with liquid Supervision: Staff to assist with self feeding Compensations: Minimize environmental distractions;Slow rate;Small sips/bites Postural Changes and/or Swallow Maneuvers: Seated upright 90 degrees                Oral Care Recommendations: Oral care BID Follow up Recommendations: Inpatient Rehab SLP Visit Diagnosis: Dysphagia, unspecified (R13.10) Plan: All goals met       GO               Yvette Phillips B. Rutherford Nail M.S., CCC-SLP, [place] [facility] [phone]  Yvette Phillips 07/07/2020, 12:59 PM

## 2020-07-07 NOTE — Progress Notes (Signed)
PHARMACY CONSULT NOTE  Pharmacy Consult for Electrolyte Monitoring and Replacement   Recent Labs: Potassium (mmol/L)  Date Value  07/07/2020 4.2   Magnesium (mg/dL)  Date Value  02/21/5746 1.9   Calcium (mg/dL)  Date Value  34/07/7094 9.3   Albumin (g/dL)  Date Value  43/83/8184 3.0 (L)   Sodium (mmol/L)  Date Value  07/07/2020 137    Assessment: 60 yo female admitted with central cord syndrome. Pharmacy consulted for electrolyte replacement.   Goal of Therapy:  Electrolytes WNL   Plan:   Electrolytes are WNL and stable  Next electrolytes 2/14   Burnis Medin, PharmD, BCPS 07/07/2020 7:06 AM

## 2020-07-08 NOTE — Progress Notes (Signed)
[facility]  - [place] [facility]   PATIENT NAME: Yvette Phillips    MR#:  [id number]  DATE OF BIRTH:  30-Jul-1960  SUBJECTIVE:   Working with physical therapy. Denies much pain. Overall slowly improving with UE strength Very motivated to work with PT.  REVIEW OF SYSTEMS:   Review of Systems  Constitutional: Negative for chills, fever and weight loss.  HENT: Negative for ear discharge, ear pain and nosebleeds.   Eyes: Negative for blurred vision, pain and discharge.  Respiratory: Positive for cough. Negative for sputum production, shortness of breath, wheezing and stridor.   Cardiovascular: Negative for chest pain, palpitations, orthopnea and PND.  Gastrointestinal: Negative for abdominal pain, diarrhea, nausea and vomiting.  Genitourinary: Negative for frequency and urgency.  Musculoskeletal: Negative for back pain.  Neurological: Positive for weakness. Negative for sensory change, speech change and focal weakness.  Psychiatric/Behavioral: Negative for depression and hallucinations. The patient is not nervous/anxious.    Tolerating Diet:yesTolerating PT: CIR  DRUG ALLERGIES:   Allergies  Allergen Reactions  . Codeine Anaphylaxis  . Penicillins Anaphylaxis    VITALS:  Blood pressure (!) 141/77, pulse 94, temperature 98.4 F (36.9 C), temperature source Oral, resp. rate 15, height 5\' 7"  (1.702 m), weight 104.3 kg, SpO2 98 %.  PHYSICAL EXAMINATION:   Physical Exam  GENERAL:  60 y.o.-year-old patient lying in the bed with no acute distress.  LUNGS: Normal breath sounds bilaterally, no wheezing, rales, rhonchi. No use of accessory muscles of respiration.  CARDIOVASCULAR: S1, S2 normal. No murmurs, rubs, or gallops.  ABDOMEN: Soft, nontender, nondistended. Bowel sounds present. No organomegaly or mass.  EXTREMITIES: No cyanosis, clubbing or edema b/l.    NEUROLOGIC: Cranial nerves II through XII are intact. bilateral weakness UE and LE 4/5 PSYCHIATRIC:   patient is alert and oriented x 3.  SKIN:  Pressure Injury 07/04/20 Buttocks Left Stage 2 -  Partial thickness loss of dermis presenting as a shallow open injury with a red, pink wound bed without slough. (Active)  07/04/20 0030  Location: Buttocks  Location Orientation: Left  Staging: Stage 2 -  Partial thickness loss of dermis presenting as a shallow open injury with a red, pink wound bed without slough.  Wound Description (Comments):   Present on Admission: Yes        LABORATORY PANEL:  CBC Recent Labs  Lab 07/04/20 0519  WBC 7.5  HGB 13.4  HCT 39.7  PLT 263    Chemistries  Recent Labs  Lab 07/04/20 0519 07/07/20 0504  NA 137 137  K 3.4* 4.2  CL 100 99  CO2 26 27  GLUCOSE 83 89  BUN 11 9  CREATININE 0.83 0.75  CALCIUM 9.2 9.3  MG 1.5* 1.9  AST 22  --   ALT 13  --   ALKPHOS 53  --   BILITOT 1.3*  --    Cardiac Enzymes No results for input(s): TROPONINI in the last 168 hours. RADIOLOGY:  No results found. ASSESSMENT AND PLAN:  Yvette Phillips is a 60 y.o. female presents to the ER via EMS after DSS can evaluate patient for on the floor.  Is having frequent falls not able to care for her himself as she lives at home alone.  Patient was admitted to [facility] in early January found to have Covid as well as MRI suggesting central cord syndrome.  Was evaluate by neurosurgery and discharged home for outpatient follow-up.  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 She will benefit from acute inpatient rehab. Awaiting decision from CIR at [facility] for acceptance.  Dysphagia -- seen by speech therapy. Appears primarily esophageal suspected due to postop inflammation. --  barium  swallow as per recommendation--Normal esophageal motility  Hypokalemia and hypomagnesemia: Repleted  Polysubstance abuse (cocaine, cannabinoids, alcohol, tobacco): She has been counseled to quit.  Ativan as needed for alcohol withdrawal syndrome.  Elevated blood pressure without diagnosis of hypertension -- PRN IV hydralazine. Will continue to monitor -BP stable  Pressure Injury 07/04/20 Buttocks Left Stage 2 -  Partial thickness loss of dermis presenting as a shallow open injury with a red, pink wound bed without slough. (Active)  07/04/20 0030  Location: Buttocks  Location Orientation: Left  Staging: Stage 2 -  Partial thickness loss of dermis presenting as a shallow open injury with a red, pink wound bed without slough.  Wound Description (Comments):   Present on Admission: Yes    Procedures: anterior cervical decompression/discectomy Family communication : sister Yvette Phillips  Consults : neurosurgery CODE STATUS: full code DVT Prophylaxis : heparin Level of care: Med-Surg Status is: Inpatient  Remains inpatient appropriate because:Unsafe d/c plan   Dispo: The patient is from: Home              Anticipated d/c is to: CIR              Anticipated d/c date is:TBD              Patient currently is medically stable to d/c.   Difficult to place patient No patient is status post day 34cervical fusion/disectomy. Physical therapy/Neurosx  recommends CIR. Awaiting decision from [facility] CIR. Will continue PT while patient is in house.       TOTAL TIME TAKING CARE OF THIS PATIENT: 25 minutes.  >50% time spent on counselling and coordination of care  Note: This dictation was prepared with Dragon dictation along with smaller phrase technology. Any transcriptional errors that result from this process are unintentional.  Enedina Finner M.D    [facility]   CC: Primary care physician; Patient, No Pcp PerPatient ID: Yvette Phillips, female   DOB: Oct 23, 1960, 60 y.o.   MRN:  [id number]

## 2020-07-08 NOTE — Progress Notes (Signed)
Physical Therapy Treatment Patient Details Name: Yvette Phillips MRN: [id number] DOB: [birth year] Today's Date: 07/08/2020    History of Present Illness Pt is a 60 y.o. female presenting to hospital 07/03/20.  Per MD H&P: "Patient explains that on Christmas eve she fell in her apartment and since then she suddenly experienced severe weakness of her both arms and both legs.  At that time, patient eventually brought by EMS to [facility] on 12/27 and was hospitalized until 1/1.  During that hospitalization patient was diagnosed with central cord syndrome as well as an incidental diagnosis of mild COVID-19.  Patient was discharged home on 1/1 with outpatient neurosurgery follow-up for outpatient surgical intervention.  Since that time, patient has experienced progressively worsening weakness of the arms and legs.  Patient explains that she is basically "rolling around" her apartment to get around.  She has been inconsistently receiving her meals from her significant other.  After once again being unable to get up off the ground for several days, patient was once again found down after a well check and was brought into [facility] emergency department for evaluation".  Imaging negative for acute fx L knee.  MRI brain negative for acute intracranial abnormality.  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  Pt s/p ACDF C4-5 07/04/20.  Per chart, no pertinent PMH.    PT Comments    Pt ready and motivated for session.  Participated in exercises as described below.  During ex noted to be inc mod soft/loose bm.  Rolling left/right with rail and min guard for care and able to hold position well and for extended time for care.  +2 assist called for mobility.  She is able to initiate moving LE's to EOB and with HOB elevated, rolled to right and mod a x  1 to sit.  She is able to assist wit scooting to EOB with min assist and cues to be aware of wrist position as she would try to push off of flexed wrist increasing risk for injury.  Cues to make sure palmar surface is on bed and not back of hand.  Once sitting she requires min guard for dynamic activities.  She is able to stand to platform walker from raised bed with max a x 2 and transfer to chair at bedside.  Cues to stand tall as she steps.  Poor steps with sinking stance eventually guided to sitting for safety in recliner.  Tech in to assist and encouraged to have +2 assist and belt back to bed.  Voiced understanding and comfort with transfer.      Follow Up Recommendations  CIR     Equipment Recommendations       Recommendations for Other Services       Precautions / Restrictions Precautions Precautions: Fall;Cervical Precaution Comments: s/p ACDF C4-5 (per secure messaging with MD Myer Haff 07/05/20--no cervical brace needed) Restrictions Weight Bearing Restrictions: No    Mobility  Bed Mobility Overal bed mobility: Needs Assistance Bed Mobility: Rolling;Sidelying to Sit Rolling: Min assist Sidelying to sit: Mod assist       General bed mobility comments: good effort rolling and getting to EOB today    Transfers Overall transfer level: Needs assistance Equipment used: Bilateral platform walker Transfers: Sit to/from Stand Sit to Stand: Mod assist;Max assist;+2 physical assistance Stand pivot transfers: Mod assist;+2 physical assistance  General transfer comment: poor ability to stand fully today and sits before fully turning to chair despite cues  Ambulation/Gait Ambulation/Gait assistance: Max assist;Mod assist;+2 physical assistance Gait Distance (Feet): 2 Feet Assistive device: Bilateral platform walker   Gait velocity: decreased   General Gait Details: poor steps today turning to chair   Stairs             Wheelchair Mobility    Modified  Rankin (Stroke Patients Only)       Balance Overall balance assessment: Needs assistance Sitting-balance support: No upper extremity supported;Feet supported Sitting balance-Leahy Scale: Fair Sitting balance - Comments: steady sitting with at least single UE support but hands on assist for safety due to instability with scooting   Standing balance support: Bilateral upper extremity supported Standing balance-Leahy Scale: Poor Standing balance comment: high fall risk                            Cognition Arousal/Alertness: Awake/alert Behavior During Therapy: WFL for tasks assessed/performed Overall Cognitive Status: Within Functional Limits for tasks assessed                                        Exercises General Exercises - Lower Extremity Ankle Circles/Pumps: AROM;Right;AAROM;Left;Strengthening;10 reps;Supine Quad Sets: AROM;Strengthening;Both;10 reps;Supine Gluteal Sets: AROM;Strengthening;Both;10 reps;Supine Short Arc Quad: AAROM;Strengthening;Both;10 reps;Supine Heel Slides: AAROM;Strengthening;Both;10 reps;Supine Hip ABduction/ADduction: AAROM;Strengthening;Both;10 reps;Supine    General Comments        Pertinent Vitals/Pain Pain Assessment: Faces Faces Pain Scale: Hurts little more Pain Location: L knee with flexion Pain Descriptors / Indicators: Sore;Guarding Pain Intervention(s): Limited activity within patient's tolerance;Monitored during session;Repositioned    Home Living                      Prior Function            PT Goals (current goals can now be found in the care plan section) Progress towards PT goals: Progressing toward goals    Frequency    BID      PT Plan Current plan remains appropriate    Co-evaluation              AM-PAC PT "6 Clicks" Mobility   Outcome Measure  Help needed turning from your back to your side while in a flat bed without using bedrails?: A Little Help needed moving  from lying on your back to sitting on the side of a flat bed without using bedrails?: A Lot Help needed moving to and from a bed to a chair (including a wheelchair)?: A Lot Help needed standing up from a chair using your arms (e.g., wheelchair or bedside chair)?: A Lot Help needed to walk in hospital room?: Total Help needed climbing 3-5 steps with a railing? : Total 6 Click Score: 11    End of Session Equipment Utilized During Treatment: Gait belt Activity Tolerance: Patient tolerated treatment well Patient left: in chair;with call bell/phone within reach;with chair alarm set Nurse Communication: Mobility status;Precautions PT Visit Diagnosis: Other abnormalities of gait and mobility (R26.89);Unsteadiness on feet (R26.81);Muscle weakness (generalized) (M62.81);History of falling (Z91.81);Difficulty in walking, not elsewhere classified (R26.2)     Time: 1324-4010 PT Time Calculation (min) (ACUTE ONLY): 38 min  Charges:  $Therapeutic Exercise: 8-22 mins $Therapeutic Activity: 23-37 mins  Danielle Dess, PTA 07/08/20, 10:59 AM

## 2020-07-08 NOTE — PMR Pre-admission (Shared)
PMR Admission Coordinator Pre-Admission Assessment  Patient: Yvette Phillips is an 60 y.o., female MRN: [id number] DOB: 06/21/1960 Height: 5\' 7"  (170.2 cm) Weight: 104.3 kg  Insurance Information HMO:     PPO:      PCP:      IPA:      80/20:      OTHER:  PRIMARY: Uninsured      Policy#:       Subscriber:  CM Name:       Phone#:      Fax#:  Pre-Cert#:       Employer:  Benefits:  Phone #:      Name:  Eff. Date:      Deduct:       Out of Pocket Max:       Life Max:  CIR:       SNF:  Outpatient:      Co-Pay:  Home Health:       Co-Pay:  DME:      Co-Pay:  Providers:  SECONDARY:       Policy#:      Phone#:   Financial Counselor: ***      Phone#: ***  The "Data Collection Information Summary" for patients in Inpatient Rehabilitation Facilities with attached "Privacy Act Statement-Health Care Records" was provided and verbally reviewed with: {CHL IP Patient Family  Emergency Contact Information Contact Information    Name Relation Home Work Mobile   cris,joyce    [phone]   MEADOW, ABRAMO [phone]     [phone]    [phone]      Current Medical History  Patient Admitting Diagnosis: central cord syndrome s/p ACDF C4-5 History of Present Illness: ***    Patient's medical record from [facility] has been reviewed by the rehabilitation admission coordinator and physician.  Past Medical History  History reviewed. No pertinent past medical history.  Family History   family history is not on file.  Prior Rehab/Hospitalizations Has the patient had prior rehab or hospitalizations prior to admission? No  Has the patient had major surgery during 100 days prior to admission? Yes   Current Medications  Current Facility-Administered Medications:  .  acetaminophen (TYLENOL) tablet 650 mg, 650 mg, Oral, Q6H PRN, 650 mg at 07/07/20 2030 **OR** acetaminophen (TYLENOL) suppository 650 mg, 650 mg, Rectal, Q6H PRN, 2031, MD .  alum  & mag hydroxide-simeth (MAALOX/MYLANTA) 200-200-20 MG/5ML suspension 30 mL, 30 mL, Oral, Q6H PRN, 12-02-2000, NP, 30 mL at 07/07/20 2031 .  baclofen (LIORESAL) tablet 10 mg, 10 mg, Oral, TID PRN, 2032, MD, 10 mg at 07/06/20 2156 .  docusate sodium (COLACE) capsule 100 mg, 100 mg, Oral, BID, 2157, MD, 100 mg at 07/07/20 2304 .  enoxaparin (LOVENOX) injection 52.5 mg, 0.5 mg/kg, Subcutaneous, Q24H, Belue, 2305, RPH, 52.5 mg at 07/08/20 1008 .  famotidine (PEPCID) tablet 20 mg, 20 mg, Oral, QHS, 07/10/20, NP, 20 mg at 07/07/20 2304 .  folic acid (FOLVITE) tablet 1 mg, 1 mg, Oral, Daily, 2305, MD, 1 mg at 07/08/20 1007 .  multivitamin with minerals tablet 1 tablet, 1 tablet, Oral, Daily, 07/10/20, MD, 1 tablet at 07/08/20 1007 .  nicotine (NICODERM CQ - dosed in mg/24 hours) patch 14 mg, 14 mg, Transdermal, Daily, 07/10/20, MD, 14 mg at 07/08/20 1007 .  ondansetron (ZOFRAN) tablet 4 mg, 4 mg, Oral, Q6H PRN **OR** ondansetron (ZOFRAN) injection 4 mg, 4 mg, Intravenous, Q6H PRN, 07/10/20, MD .  polyethylene glycol (MIRALAX / GLYCOLAX) packet 17 g, 17 g, Oral, BID, Enedina Finner, MD, 17 g at 07/07/20 0900 .  thiamine tablet 100 mg, 100 mg, Oral, Daily, 100 mg at 07/08/20 1007 **OR** [DISCONTINUED] thiamine (B-1) injection 100 mg, 100 mg, Intravenous, Daily, Venetia Night, MD, 100 mg at 07/04/20 0935  Patients Current Diet:  Diet Order            DIET DYS 2 Room service appropriate? Yes; Fluid consistency: Thin  Diet effective now                 Precautions / Restrictions Precautions Precautions: Fall,Cervical Precaution Comments: s/p ACDF C4-5 (per secure messaging with MD Myer Haff 07/05/20--no cervical brace needed) Restrictions Weight Bearing Restrictions: No   Has the patient had 2 or more falls or a fall with injury in the past year? Yes  Prior Activity Level Community (5-7x/wk): works 5  days/week  Prior Functional Level Self Care: Did the patient need help bathing, dressing, using the toilet or eating? Independent  Indoor Mobility: Did the patient need assistance with walking from room to room (with or without device)? Independent  Stairs: Did the patient need assistance with internal or external stairs (with or without device)? Needed some help  Functional Cognition: Did the patient need help planning regular tasks such as shopping or remembering to take medications? Independent  Home Assistive Devices / Equipment Home Assistive Devices/Equipment: Environmental consultant (specify type) Home Equipment: Walker - 2 wheels,Walker - 4 wheels  Prior Device Use: Indicate devices/aids used by the patient prior to current illness, exacerbation or injury? None of the above  Current Functional Level Cognition  Overall Cognitive Status: Within Functional Limits for tasks assessed Orientation Level: Oriented X4 General Comments: Pt is A and extremely pleasant. HIghly motivated to improve    Extremity Assessment (includes Sensation/Coordination)  Upper Extremity Assessment: RUE deficits/detail,LUE deficits/detail RUE Deficits / Details: Pt with shld flexion to ~1/4 range. Almost absent wrist extension (2/5, gravity decreased plane), grip grossly 3+/5, Elbow flexion grossly 4-/5 RUE Coordination: decreased fine motor,decreased gross motor LUE Deficits / Details: Pt able to perform shoulder flexion to ~1/3 range. Decreased almost absent wrist extension. Pt's elbow flexion MMT grossly 3+/5, grip MMT grossly 3/5 LUE Coordination: decreased fine motor,decreased gross motor  Lower Extremity Assessment: Defer to PT evaluation,Generalized weakness (L appears slightly weaker than R. Overall pt with limited hip flexion in sitting against gravity and decreased hip/knee flexion for stepping in standing. Pt with some mild knee buckling as well requiring blocking bilaterally.) RLE Deficits / Details: hip  flexion 3-/5; knee extension 3/5; knee flexion at least 3/5; DF at least 3/5 AROM (limited DF ROM to grossly 10 degrees short of neutral) RLE Coordination: decreased fine motor,decreased gross motor LLE Deficits / Details: hip flexion 2-/5; knee extension 2-/5; knee flexion 2-/5; DF 2-/5 (limited DF ROM to grossly 20 degrees short of neutral AAROM) LLE Coordination: decreased fine motor,decreased gross motor    ADLs  Overall ADL's : Needs assistance/impaired Eating/Feeding: Set up,With adaptive utensils,Cueing for safety,Cueing for sequencing Eating/Feeding Details (indicate cue type and reason): Pt provided with built-up handles x2 to support functional independence with self-feeding. She is able to return demonstrate understanding of safe use of built up handle using a spoon to scoop ice cubes x10. Pt able to return demo understanding for how to apply handles to utensils with min cueing for technique for use of her LUE. Pt demos a refined tripod grasp when using built up handle and  spoon in her RUE. She is eager to trial at her next meal time. Grooming Details (indicate cue type and reason): Pt educated on safe use of built up handle for grooming tasks as well as self-feeding this date. Functional mobility during ADLs: +2 for physical assistance,Cueing for sequencing,Rolling walker General ADL Comments: Pt requires MIN/MOD A for bed level or seated UB ADLs d/t decreased ROM and FMC to hold objects such as a washcloth. Pt requires MAX to TOTAL A for bed level or seated LB ADLs and cannot tolerate participation in standing self care at this time d/t very poor standing balance requiring 2p assist for transfers, weight shift and to sustain static stand.    Mobility  Overal bed mobility: Needs Assistance Bed Mobility: Rolling,Sidelying to Sit Rolling: Min assist Sidelying to sit: Mod assist Supine to sit: Max assist Sit to supine: Max assist,+2 for safety/equipment Sit to sidelying: +2 for  safety/equipment,Max assist General bed mobility comments: good effort rolling and getting to EOB today    Transfers  Overall transfer level: Needs assistance Equipment used: Bilateral platform walker Transfers: Sit to/from Stand Sit to Stand: Mod assist,Max assist,+2 physical assistance Stand pivot transfers: Mod assist,+2 physical assistance General transfer comment: poor ability to stand fully today and sits before fully turning to chair despite cues    Ambulation / Gait / Stairs / Wheelchair Mobility  Ambulation/Gait Ambulation/Gait assistance: Max assist,Mod assist,+2 physical assistance Gait Distance (Feet): 2 Feet Assistive device: Bilateral platform walker Gait Pattern/deviations: Step-to pattern,Staggering left,Staggering right,Trunk flexed,Narrow base of support,Decreased weight shift to left,Decreased step length - left,Decreased step length - right General Gait Details: poor steps today turning to chair Gait velocity: decreased    Posture / Balance Dynamic Sitting Balance Sitting balance - Comments: steady sitting with at least single UE support but hands on assist for safety due to instability with scooting Balance Overall balance assessment: Needs assistance Sitting-balance support: No upper extremity supported,Feet supported Sitting balance-Leahy Scale: Fair Sitting balance - Comments: steady sitting with at least single UE support but hands on assist for safety due to instability with scooting Standing balance support: Bilateral upper extremity supported Standing balance-Leahy Scale: Poor Standing balance comment: high fall risk    Special needs/care consideration Skin Surgical incision: neck; Abrasion: bilateral elbow; Pressure injury: Left buttocks stage 2, Bladder incontinence, External urinary catheter and Designated visitor Leighton Parody, sister   Previous Home Environment (from acute therapy documentation) Living Arrangements: Alone Available Help at Discharge:  Domingo Cocking (Comment) (sister, boyfriend) Type of Home: Apartment Home Layout: One level Home Access: Stairs to enter Entrance Stairs-Rails: Interior and spatial designer of Steps: 5 Bathroom Shower/Tub: Health visitor: Standard Bathroom Accessibility: Yes How Accessible: Accessible via walker Home Care Services: No Additional Comments: Pt reports living in studio apt/duplex.  Discharge Living Setting Plans for Discharge Living Setting: Patient's home Type of Home at Discharge: Apartment Discharge Home Layout: One level Discharge Home Access: Stairs to enter Entrance Stairs-Rails: Left,Right Entrance Stairs-Number of Steps: 5 Discharge Bathroom Shower/Tub: Walk-in shower Discharge Bathroom Toilet: Standard Discharge Bathroom Accessibility: Yes How Accessible: Accessible via walker Does the patient have any problems obtaining your medications?: No  Social/Family/Support Systems Anticipated Caregiver: Glorianne Manchester, boyfrined; Leighton Parody, sister Anticipated Caregiver's Contact Information: Marcial Pacas: [phone] Bene: [phone] Caregiver Availability: 24/7 Discharge Plan Discussed with Primary Caregiver: Yes Is Caregiver In Agreement with Plan?: Yes Does Caregiver/Family have Issues with Lodging/Transportation while Pt is in Rehab?: No  Goals Patient/Family Goal for Rehab: *** Expected length of stay: ***  Pt/Family Agrees to Admission and willing to participate: Yes Program Orientation Provided & Reviewed with Pt/Caregiver Including Roles  & Responsibilities: Yes  Decrease burden of Care through IP rehab admission: NA  Possible need for SNF placement upon discharge: Not anticipated  Patient Condition: {PATIENT'S CONDITION:22832}  Preadmission Screen Completed By:  Domingo PulseLauren P Graves Madden, 07/08/2020 2:51 PM ______________________________________________________________________   Discussed status with Dr. Marland Kitchen*** on *** at *** and received approval for  admission today.  Admission Coordinator:  Domingo PulseLauren P Graves Madden, CCC-SLP, time ***Dorna Bloom/Date ***   Assessment/Plan: Diagnosis: 1. Does the need for close, 24 hr/day Medical supervision in concert with the patient's rehab needs make it unreasonable for this patient to be served in a less intensive setting? {yes_no_potentially:3041433} 2. Co-Morbidities requiring supervision/potential complications: *** 3. Due to {due ZO:1096045}to:3041434}, does the patient require 24 hr/day rehab nursing? {yes_no_potentially:3041433} 4. Does the patient require coordinated care of a physician, rehab nurse, PT, OT, and SLP to address physical and functional deficits in the context of the above medical diagnosis(es)? {yes_no_potentially:3041433} Addressing deficits in the following areas: {deficits:3041436} 5. Can the patient actively participate in an intensive therapy program of at least 3 hrs of therapy 5 days a week? {yes_no_potentially:3041433} 6. The potential for patient to make measurable gains while on inpatient rehab is {potential:3041437} 7. Anticipated functional outcomes upon discharge from inpatient rehab: {functional outcomes:304600100} PT, {functional outcomes:304600100} OT, {functional outcomes:304600100} SLP 8. Estimated rehab length of stay to reach the above functional goals is: *** 9. Anticipated discharge destination: {anticipated dc setting:21604} 10. Overall Rehab/Functional Prognosis: {potential:3041437}   MD Signature: ***

## 2020-07-08 NOTE — Progress Notes (Signed)
Inpatient Rehab Admissions Coordinator:  Spoke with pt's sister, Alona Bene via phone. Explained CIR goals and expectations. She acknowledged understanding. She is interested in pt pursuing CIR. She indicated that she will be available to assist pt after discharge.  Will continue to follow.    Wolfgang Phoenix, MS, CCC-SLP Admissions Coordinator [phone]

## 2020-07-09 LAB — BASIC METABOLIC PANEL
Anion gap: 10 (ref 5–15)
BUN: 10 mg/dL (ref 8–23)
CO2: 27 mmol/L (ref 22–32)
Calcium: 9.4 mg/dL (ref 8.9–10.3)
Chloride: 99 mmol/L (ref 98–111)
Creatinine, Ser: 0.78 mg/dL (ref 0.44–1.00)
GFR, Estimated: >60 mL/min (ref >60)
Glucose, Bld: 92 mg/dL (ref 70–99)
Potassium: 4 mmol/L (ref 3.5–5.1)
Sodium: 136 mmol/L (ref 135–145)

## 2020-07-09 NOTE — Progress Notes (Signed)
Physical Therapy Treatment Patient Details Name: Yvette Phillips MRN: [id number] DOB: 06/27/1960 Today's Date: 07/09/2020    History of Present Illness Pt is a 60 y.o. female presenting to hospital 07/03/20.  Per MD H&P: "Patient explains that on Christmas eve she fell in her apartment and since then she suddenly experienced severe weakness of her both arms and both legs.  At that time, patient eventually brought by EMS to [facility] on 12/27 and was hospitalized until 1/1.  During that hospitalization patient was diagnosed with central cord syndrome as well as an incidental diagnosis of mild COVID-19.  Patient was discharged home on 1/1 with outpatient neurosurgery follow-up for outpatient surgical intervention.  Since that time, patient has experienced progressively worsening weakness of the arms and legs.  Patient explains that she is basically "rolling around" her apartment to get around.  She has been inconsistently receiving her meals from her significant other.  After once again being unable to get up off the ground for several days, patient was once again found down after a well check and was brought into [facility] emergency department for evaluation".  Imaging negative for acute fx L knee.  MRI brain negative for acute intracranial abnormality.  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  Pt s/p ACDF C4-5 07/04/20.  Per chart, no pertinent PMH.    PT Comments    Supine ex 2 x 10 - pt with cramping/tone on first set with both legs flexing with attempts at movements.  Stated it was involuntary.  Primary PT in to check as pt stated it was different today but on second set, she she did not display issues.  She is able to get to side lying on her own but needs mod a x 1 to get upper body off of bed with HOB raised to assist with  cervical precautions.  Once sitting, she is generally steady statically but needs increased supervision with attempts to scoot to EOB and min assist.  She is able to stand from raised bed with mod a x 2.  Verbal and tactile cues to stand fully and overall steps improved to chair today but continues with some buckling.  Seated ex 3 x 10.  Attempted sit to stand x 5 from recliner but she is unable to clear hips from chair without +2 assist.     Follow Up Recommendations  CIR     Equipment Recommendations  Other (comment)    Recommendations for Other Services       Precautions / Restrictions Precautions Precautions: Fall;Cervical Precaution Comments: s/p ACDF C4-5 (per secure messaging with MD Myer Haff 07/05/20--no cervical brace needed) Restrictions Weight Bearing Restrictions: No    Mobility  Bed Mobility Overal bed mobility: Needs Assistance   Rolling: Supervision;Min guard Sidelying to sit: Mod assist            Transfers Overall transfer level: Needs assistance Equipment used: Bilateral platform walker Transfers: Sit to/from Stand Sit to Stand: Mod assist;Max assist;+2 physical assistance            Ambulation/Gait Ambulation/Gait assistance: Mod assist;+2 physical assistance Gait Distance (Feet): 2 Feet Assistive device: Bilateral platform walker Gait Pattern/deviations: Step-to pattern;Staggering left;Staggering right;Trunk flexed;Narrow base of support;Decreased weight shift to left;Decreased step length - left;Decreased step length - right Gait velocity: decreased   General Gait Details: somewhat improved transfer today with  better steps and posture.   Stairs             Wheelchair Mobility    Modified Rankin (Stroke Patients Only)       Balance Overall balance assessment: Needs assistance Sitting-balance support: No upper extremity supported;Feet supported Sitting balance-Leahy Scale: Fair Sitting balance - Comments: steady sitting with at  least single UE support but hands on assist for safety due to instability with scooting   Standing balance support: Bilateral upper extremity supported Standing balance-Leahy Scale: Poor Standing balance comment: high fall risk                            Cognition Arousal/Alertness: Awake/alert Behavior During Therapy: WFL for tasks assessed/performed Overall Cognitive Status: Within Functional Limits for tasks assessed                                 General Comments: Pt is A and extremely pleasant. HIghly motivated to improve      Exercises Other Exercises Other Exercises: supine ex 2 x 10, seated ex 3 x 10 AAROM for full range.    General Comments        Pertinent Vitals/Pain Pain Assessment: Faces Faces Pain Scale: Hurts little more Pain Location: General LE discomfort Pain Descriptors / Indicators: Sore;Guarding;Cramping;Contraction Pain Intervention(s): Limited activity within patient's tolerance;Monitored during session;Repositioned    Home Living                      Prior Function            PT Goals (current goals can now be found in the care plan section) Progress towards PT goals: Progressing toward goals    Frequency    BID      PT Plan Current plan remains appropriate    Co-evaluation              AM-PAC PT "6 Clicks" Mobility   Outcome Measure  Help needed turning from your back to your side while in a flat bed without using bedrails?: A Little Help needed moving from lying on your back to sitting on the side of a flat bed without using bedrails?: A Lot Help needed moving to and from a bed to a chair (including a wheelchair)?: A Lot Help needed standing up from a chair using your arms (e.g., wheelchair or bedside chair)?: A Lot Help needed to walk in hospital room?: Total Help needed climbing 3-5 steps with a railing? : Total 6 Click Score: 11    End of Session Equipment Utilized During Treatment:  Gait belt Activity Tolerance: Patient tolerated treatment well Patient left: in chair;with call bell/phone within reach;with chair alarm set Nurse Communication: Mobility status;Precautions       Time: 0037-0488 PT Time Calculation (min) (ACUTE ONLY): 29 min  Charges:  $Therapeutic Exercise: 8-22 mins $Therapeutic Activity: 8-22 mins                    Danielle Dess, PTA 07/09/20, 1:09 PM

## 2020-07-09 NOTE — Progress Notes (Signed)
Inpatient Rehab Admissions Coordinator:  There are no beds available in CIR today.  Will continue to follow.   Ethin Drummond Graves Madden, MS, CCC-SLP Admissions Coordinator [phone]  

## 2020-07-09 NOTE — Progress Notes (Signed)
PHARMACY CONSULT NOTE  Pharmacy Consult for Electrolyte Monitoring and Replacement   Recent Labs: Potassium (mmol/L)  Date Value  07/09/2020 4.0   Magnesium (mg/dL)  Date Value  18/86/7737 1.9   Calcium (mg/dL)  Date Value  36/68/1594 9.4   Albumin (g/dL)  Date Value  70/76/1518 3.0 (L)   Sodium (mmol/L)  Date Value  07/09/2020 136    Assessment: 60 yo female admitted with central cord syndrome. Pharmacy consulted for electrolyte replacement.   Goal of Therapy:  Electrolytes WNL   Plan:   Electrolytes are WNL and stable  Pharmacy will sign off  Yvette Phillips, PharmD, BCPS Clinical Pharmacist 07/09/2020 8:11 AM

## 2020-07-09 NOTE — Progress Notes (Signed)
Physical Therapy Treatment Patient Details Name: Yvette Phillips MRN: [id number] DOB: Oct 23, 1960 Today's Date: 07/09/2020    History of Present Illness Pt is a 60 y.o. female presenting to hospital 07/03/20.  Per MD H&P: "Patient explains that on Christmas eve she fell in her apartment and since then she suddenly experienced severe weakness of her both arms and both legs.  At that time, patient eventually brought by EMS to [facility] on 12/27 and was hospitalized until 1/1.  During that hospitalization patient was diagnosed with central cord syndrome as well as an incidental diagnosis of mild COVID-19.  Patient was discharged home on 1/1 with outpatient neurosurgery follow-up for outpatient surgical intervention.  Since that time, patient has experienced progressively worsening weakness of the arms and legs.  Patient explains that she is basically "rolling around" her apartment to get around.  She has been inconsistently receiving her meals from her significant other.  After once again being unable to get up off the ground for several days, patient was once again found down after a well check and was brought into [facility] emergency department for evaluation".  Imaging negative for acute fx L knee.  MRI brain negative for acute intracranial abnormality.  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  Pt s/p ACDF C4-5 07/04/20.  Per chart, no pertinent PMH.    PT Comments    Pt in recliner, ready for session.  Stood with mod a x 2 to platform walker.  Emphasis on standing posture and standing marches and SLR.  She continues to require heavy support from platform walker and from staff with occasional buckling with ex.  After seated rest, she is able to stand and transfer to bed with min a x 2.  She has 2 further trials with gait forward and  backwards from bed.  Cues for foot placement and general awareness and safety.  Increased difficulty stepping backwards to bed.  On 4th standing trial, she is fatigued and elects to stand and defer further gait.  Pt remains highly motivated and with a good attitude towards her recovery.  CIR remains appropriate.  She is able to tolerate extended PT sessions.     Follow Up Recommendations  CIR     Equipment Recommendations  Other (comment)    Recommendations for Other Services       Precautions / Restrictions Precautions Precautions: Fall;Cervical Precaution Comments: s/p ACDF C4-5 (per secure messaging with MD Myer Haff 07/05/20--no cervical brace needed) Restrictions Weight Bearing Restrictions: No    Mobility  Bed Mobility Overal bed mobility: Needs Assistance Bed Mobility: Sit to Supine Rolling: Supervision;Min guard Sidelying to sit: Mod assist   Sit to supine: Mod assist;Max assist;+2 for physical assistance        Transfers Overall transfer level: Needs assistance Equipment used: Bilateral platform walker Transfers: Sit to/from Stand Sit to Stand: Min assist;Mod assist;+2 physical assistance;From elevated surface         General transfer comment: increased assist from lower surfaces like recliner  Ambulation/Gait Ambulation/Gait assistance: Mod assist;+2 physical assistance;+2 safety/equipment Gait Distance (Feet): 6 Feet Assistive device: Bilateral platform walker Gait Pattern/deviations: Step-to pattern;Staggering left;Staggering right;Trunk flexed;Narrow base of support;Decreased weight shift to left;Decreased step length - left;Decreased step length - right Gait velocity: decreased   General Gait Details: cues to increase BOS and stand tall during steps/gait   Stairs  Wheelchair Mobility    Modified Rankin (Stroke Patients Only)       Balance Overall balance assessment: Needs assistance Sitting-balance support: No upper  extremity supported;Feet supported Sitting balance-Leahy Scale: Fair Sitting balance - Comments: steady sitting with at least single UE support but hands on assist for safety due to instability with scooting   Standing balance support: Bilateral upper extremity supported Standing balance-Leahy Scale: Poor Standing balance comment: high fall risk                            Cognition Arousal/Alertness: Awake/alert Behavior During Therapy: WFL for tasks assessed/performed Overall Cognitive Status: Within Functional Limits for tasks assessed                                 General Comments: Pt is A and extremely pleasant. HIghly motivated to improve      Exercises Other Exercises Other Exercises: standing marches and SLR x 10 BLE with platform walker and +2 assist.    General Comments        Pertinent Vitals/Pain Pain Assessment: Faces Faces Pain Scale: Hurts little more Pain Location: General LE discomfort Pain Descriptors / Indicators: Sore;Guarding Pain Intervention(s): Limited activity within patient's tolerance;Monitored during session;Repositioned    Home Living                      Prior Function            PT Goals (current goals can now be found in the care plan section) Progress towards PT goals: Progressing toward goals    Frequency    BID      PT Plan Current plan remains appropriate    Co-evaluation              AM-PAC PT "6 Clicks" Mobility   Outcome Measure  Help needed turning from your back to your side while in a flat bed without using bedrails?: A Little Help needed moving from lying on your back to sitting on the side of a flat bed without using bedrails?: A Lot Help needed moving to and from a bed to a chair (including a wheelchair)?: A Lot Help needed standing up from a chair using your arms (e.g., wheelchair or bedside chair)?: A Lot Help needed to walk in hospital room?: A Lot Help needed  climbing 3-5 steps with a railing? : Total 6 Click Score: 12    End of Session Equipment Utilized During Treatment: Gait belt Activity Tolerance: Patient tolerated treatment well Patient left: in bed;with call bell/phone within reach;with bed alarm set Nurse Communication: Mobility status;Precautions PT Visit Diagnosis: Other abnormalities of gait and mobility (R26.89);Unsteadiness on feet (R26.81);Muscle weakness (generalized) (M62.81);History of falling (Z91.81);Difficulty in walking, not elsewhere classified (R26.2)     Time: 3235-5732 PT Time Calculation (min) (ACUTE ONLY): 23 min  Charges:  $Gait Training: 8-22 mins $Therapeutic Exercise: 8-22 mins $Therapeutic Activity: 8-22 mins                    Danielle Dess, PTA 07/09/20, 4:14 PM

## 2020-07-09 NOTE — Progress Notes (Signed)
[facility]  - [place] [facility]   PATIENT NAME: Yvette Phillips    MR#:  [id number]  DATE OF BIRTH:  March 04, 1961  SUBJECTIVE:   Working with physical therapy. Denies much pain. Overall slowly improving with UE strength Very motivated to work with PT.  REVIEW OF SYSTEMS:   Review of Systems  Constitutional: Negative for chills, fever and weight loss.  HENT: Negative for ear discharge, ear pain and nosebleeds.   Eyes: Negative for blurred vision, pain and discharge.  Respiratory: Positive for cough. Negative for sputum production, shortness of breath, wheezing and stridor.   Cardiovascular: Negative for chest pain, palpitations, orthopnea and PND.  Gastrointestinal: Negative for abdominal pain, diarrhea, nausea and vomiting.  Genitourinary: Negative for frequency and urgency.  Musculoskeletal: Negative for back pain.  Neurological: Positive for weakness. Negative for sensory change, speech change and focal weakness.  Psychiatric/Behavioral: Negative for depression and hallucinations. The patient is not nervous/anxious.    Tolerating Diet:yesTolerating PT: CIR  DRUG ALLERGIES:   Allergies  Allergen Reactions  . Codeine Anaphylaxis  . Penicillins Anaphylaxis    VITALS:  Blood pressure 138/86, pulse 93, temperature 97.7 F (36.5 C), resp. rate 16, height 5\' 7"  (1.702 m), weight 104.3 kg, SpO2 100 %.  PHYSICAL EXAMINATION:   Physical Exam  GENERAL:  60 y.o.-year-old patient lying in the bed with no acute distress.  LUNGS: Normal breath sounds bilaterally, no wheezing, rales, rhonchi. No use of accessory muscles of respiration.  CARDIOVASCULAR: S1, S2 normal. No murmurs, rubs, or gallops.  ABDOMEN: Soft, nontender, nondistended. Bowel sounds present. No organomegaly or mass.  EXTREMITIES: No cyanosis, clubbing or edema b/l.    NEUROLOGIC: Cranial nerves II through XII are intact. bilateral weakness UE and LE 4/5 PSYCHIATRIC:  patient is alert and oriented x  3.  SKIN:  Pressure Injury 07/04/20 Buttocks Left Stage 2 -  Partial thickness loss of dermis presenting as a shallow open injury with a red, pink wound bed without slough. (Active)  07/04/20 0030  Location: Buttocks  Location Orientation: Left  Staging: Stage 2 -  Partial thickness loss of dermis presenting as a shallow open injury with a red, pink wound bed without slough.  Wound Description (Comments):   Present on Admission: Yes        LABORATORY PANEL:  CBC Recent Labs  Lab 07/04/20 0519  WBC 7.5  HGB 13.4  HCT 39.7  PLT 263    Chemistries  Recent Labs  Lab 07/04/20 0519 07/07/20 0504 07/09/20 0456  NA 137 137 136  K 3.4* 4.2 4.0  CL 100 99 99  CO2 26 27 27   GLUCOSE 83 89 92  BUN 11 9 10   CREATININE 0.83 0.75 0.78  CALCIUM 9.2 9.3 9.4  MG 1.5* 1.9  --   AST 22  --   --   ALT 13  --   --   ALKPHOS 53  --   --   BILITOT 1.3*  --   --    Cardiac Enzymes No results for input(s): TROPONINI in the last 168 hours. RADIOLOGY:  No results found. ASSESSMENT AND PLAN:  Yvette Phillips is a 60 y.o. female presents to the ER via EMS after DSS can evaluate patient for on the floor.  Is having frequent falls not able to care for her himself as she lives at home alone.  Patient was admitted to [facility] in early January found to have Covid as well as MRI suggesting central cord syndrome.  Was evaluate by neurosurgery and discharged home for outpatient follow-up.  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 She will benefit from acute inpatient rehab.  Dysphagia -- seen by speech therapy. Appears primarily esophageal suspected due to postop inflammation. --  barium swallow as per  recommendation--Normal esophageal motility  Hypokalemia and hypomagnesemia: Repleted  Polysubstance abuse (cocaine, cannabinoids, alcohol, tobacco): She has been counseled to quit.  Ativan as needed for alcohol withdrawal syndrome.  Elevated blood pressure without diagnosis of hypertension -- PRN IV hydralazine. Will continue to monitor -BP stable  Pressure Injury 07/04/20 Buttocks Left Stage 2 -  Partial thickness loss of dermis presenting as a shallow open injury with a red, pink wound bed without slough. (Active)  07/04/20 0030  Location: Buttocks  Location Orientation: Left  Staging: Stage 2 -  Partial thickness loss of dermis presenting as a shallow open injury with a red, pink wound bed without slough.  Wound Description (Comments):   Present on Admission: Yes    Procedures: anterior cervical decompression/discectomy Family communication : sister Alona Bene  Consults : neurosurgery CODE STATUS: full code DVT Prophylaxis : heparin Level of care: Med-Surg Status is: Inpatient  Remains inpatient appropriate because:Unsafe d/c plan   Dispo: The patient is from: Home              Anticipated d/c is to: CIR              Anticipated d/c date is:TBD              Patient currently is medically stable to d/c.   Difficult to place patient No -- Physical therapy/Neurosx  recommends CIR. --per TOC--[facility] has very limited beds and likley none will be available till end of this week -- Will continue PT while patient is in house.       TOTAL TIME TAKING CARE OF THIS PATIENT: 20 minutes.  >50% time spent on counselling and coordination of care  Note: This dictation was prepared with Dragon dictation along with smaller phrase technology. Any transcriptional errors that result from this process are unintentional.  Enedina Finner M.D    [facility]   CC: Primary care physician; Patient, No Pcp PerPatient ID: Yvette Phillips, female   DOB: 08/04/1960, 60 y.o.   MRN:  [id number]

## 2020-07-10 DIAGNOSIS — S14129D Central cord syndrome at unspecified level of cervical spinal cord, subsequent encounter: Secondary | ICD-10-CM

## 2020-07-10 NOTE — Progress Notes (Signed)
Physical Therapy Treatment Patient Details Name: Yvette Phillips MRN: [id number] DOB: 1961/03/18 Today's Date: 07/10/2020    History of Present Illness Pt is a 60 y.o. female presenting to hospital 07/03/20.  Per MD H&P: "Patient explains that on Christmas eve she fell in her apartment and since then she suddenly experienced severe weakness of her both arms and both legs.  At that time, patient eventually brought by EMS to [facility] on 12/27 and was hospitalized until 1/1.  During that hospitalization patient was diagnosed with central cord syndrome as well as an incidental diagnosis of mild COVID-19.  Patient was discharged home on 1/1 with outpatient neurosurgery follow-up for outpatient surgical intervention.  Since that time, patient has experienced progressively worsening weakness of the arms and legs.  Patient explains that she is basically "rolling around" her apartment to get around.  She has been inconsistently receiving her meals from her significant other.  After once again being unable to get up off the ground for several days, patient was once again found down after a well check and was brought into [facility] emergency department for evaluation".  Imaging negative for acute fx L knee.  MRI brain negative for acute intracranial abnormality.  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  Pt s/p ACDF C4-5 07/04/20.  Per chart, no pertinent PMH.    PT Comments    Pt resting in recliner upon PT arrival.  Mod assist to stand from recliner; min to mod assist stand step turn recliner to bed; and mod assist, then max assist to stand from bed (all transfers with use of B platform walker).  Focused on above transfers and B LE strengthening ex's (sitting and semi-supine in bed) during session (pt ambulated with assist this morning).   Will continue to focus on strengthening and progressive functional mobility during hospitalization.    Follow Up Recommendations  CIR     Equipment Recommendations  Other (comment) (TBD at next facility)    Recommendations for Other Services OT consult     Precautions / Restrictions Precautions Precautions: Fall;Cervical Precaution Comments: s/p ACDF C4-5 (per secure messaging with MD Myer Haff 07/05/20--no cervical brace needed) Restrictions Weight Bearing Restrictions: No    Mobility  Bed Mobility Overal bed mobility: Needs Assistance Bed Mobility: Sit to Sidelying;Rolling Rolling: Supervision     Sit to sidelying: Mod assist (vc's for technique; assist for B LE's) General bed mobility comments: sit to R sidelying to supine    Transfers Overall transfer level: Needs assistance Equipment used: Bilateral platform walker Transfers: Sit to/from Stand;Stand Pivot Transfers Sit to Stand: Mod assist;Max assist Stand pivot transfers: Min assist;Mod assist (stand step turn recliner to bed with B platform RW)       General transfer comment: x1 trial standing from recliner (mod assist); x2 trials standing from bed (mod assist 1st trial and max assist 2nd trial); vc's for UE/LE placement; assist to initiate and come to full stand; assist to control descent sitting  Ambulation/Gait Deferred (focused on transfers and LE ex's)   Stairs             Wheelchair Mobility    Modified Rankin (Stroke Patients Only)       Balance Overall balance assessment: Needs assistance Sitting-balance support: No upper extremity supported;Feet supported Sitting balance-Leahy Scale: Fair Sitting balance - Comments: pt steady with static sitting but  close SBA for any reaching within BOS   Standing balance support: Bilateral upper extremity supported Standing balance-Leahy Scale: Poor Standing balance comment: pt requiring B UE support on B platform walker for standing balance with  assist for safety                            Cognition Arousal/Alertness: Awake/alert Behavior During Therapy: WFL for tasks assessed/performed Overall Cognitive Status: Within Functional Limits for tasks assessed                                 General Comments: Pt very motivated to participate in therapy.      Exercises General Exercises - Lower Extremity Ankle Circles/Pumps: AROM;Strengthening;Both;10 reps;Supine Quad Sets: AROM;Strengthening;Both;10 reps;Supine Gluteal Sets: AROM;Strengthening;Both;10 reps;Supine Short Arc Quad: AROM;Strengthening;Both;10 reps;Supine Long Arc Quad: AROM;Right;AAROM;Left;Strengthening;Both;10 reps;Seated Heel Slides: AAROM;Strengthening;Both;10 reps;Supine Hip ABduction/ADduction: AAROM;Strengthening;Both;10 reps;Supine Straight Leg Raises: AAROM;Strengthening;Both;10 reps;Supine Hip Flexion/Marching: AROM;Right;AAROM;Left;Strengthening;10 reps;Seated    General Comments  Pt agreeable to PT session.      Pertinent Vitals/Pain Pain Assessment: No/denies pain Faces Pain Scale: No hurt Pain Location: neck pain Pain Intervention(s): Limited activity within patient's tolerance;Monitored during session;Repositioned  Vitals (HR and O2 on room air) stable and WFL throughout treatment session.    Home Living                      Prior Function            PT Goals (current goals can now be found in the care plan section) Acute Rehab PT Goals Patient Stated Goal: Rehab then back home PT Goal Formulation: With patient Time For Goal Achievement: 07/19/20 Potential to Achieve Goals: Good Progress towards PT goals: Progressing toward goals    Frequency    BID      PT Plan Current plan remains appropriate    Co-evaluation              AM-PAC PT "6 Clicks" Mobility   Outcome Measure  Help needed turning from your back to your side while in a flat bed without using bedrails?: A Little Help  needed moving from lying on your back to sitting on the side of a flat bed without using bedrails?: A Lot Help needed moving to and from a bed to a chair (including a wheelchair)?: A Lot Help needed standing up from a chair using your arms (e.g., wheelchair or bedside chair)?: A Lot Help needed to walk in hospital room?: Total Help needed climbing 3-5 steps with a railing? : Total 6 Click Score: 11    End of Session Equipment Utilized During Treatment: Gait belt Activity Tolerance: Patient tolerated treatment well Patient left: in bed;with call bell/phone within reach;with bed alarm set;Other (comment) (B heels floating via pillow support; purewick in place) Nurse Communication: Mobility status;Precautions PT Visit Diagnosis: Other abnormalities of gait and mobility (R26.89);Unsteadiness on feet (R26.81);Muscle weakness (generalized) (M62.81);History of falling (Z91.81);Difficulty in walking, not elsewhere classified (R26.2)     Time: 4315-4008 PT Time Calculation (min) (ACUTE ONLY): 42 min  Charges:  $Therapeutic Exercise: 23-37 mins $Therapeutic Activity: 8-22 mins                     Hendricks Limes, PT 07/10/20, 2:36 PM

## 2020-07-10 NOTE — Progress Notes (Signed)
Physical Therapy Treatment Patient Details Name: Yvette Phillips MRN: [id number] DOB: [birth year] Today's Date: 07/10/2020    History of Present Illness Pt is a 60 y.o. female presenting to hospital 07/03/20.  Per MD H&P: "Patient explains that on Christmas eve she fell in her apartment and since then she suddenly experienced severe weakness of her both arms and both legs.  At that time, patient eventually brought by EMS to [facility] on 12/27 and was hospitalized until 1/1.  During that hospitalization patient was diagnosed with central cord syndrome as well as an incidental diagnosis of mild COVID-19.  Patient was discharged home on 1/1 with outpatient neurosurgery follow-up for outpatient surgical intervention.  Since that time, patient has experienced progressively worsening weakness of the arms and legs.  Patient explains that she is basically "rolling around" her apartment to get around.  She has been inconsistently receiving her meals from her significant other.  After once again being unable to get up off the ground for several days, patient was once again found down after a well check and was brought into [facility] emergency department for evaluation".  Imaging negative for acute fx L knee.  MRI brain negative for acute intracranial abnormality.  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  Pt s/p ACDF C4-5 07/04/20.  Per chart, no pertinent PMH.    PT Comments    Pt resting in bed upon PT arrival; agreeable and very motivated to participate in therapy session.  Mod assist x1 semi-supine to sitting edge of bed via logrolling; mod assist x1 to stand from bed (and recliner) with B platform walker; and min to mod assist x2 (close chair follow) to ambulate 30 feet with B platform walker (last 10 feet pt requiring assist to advance L  LE).  Will continue to focus on strengthening and progressive functional mobility.   Follow Up Recommendations  CIR     Equipment Recommendations  Other (comment) (TBD at next facility)    Recommendations for Other Services OT consult     Precautions / Restrictions Precautions Precautions: Fall;Cervical Precaution Comments: s/p ACDF C4-5 (per secure messaging with MD Myer Haff 07/05/20--no cervical brace needed) Restrictions Weight Bearing Restrictions: No    Mobility  Bed Mobility Overal bed mobility: Needs Assistance Bed Mobility: Rolling;Sidelying to Sit Rolling: Supervision (rolling R and L for clean-up d/t bowel incontinence; use of bed rail) Sidelying to sit: Mod assist       General bed mobility comments: assist for trunk R sidelying to sitting    Transfers Overall transfer level: Needs assistance Equipment used: Bilateral platform walker Transfers: Sit to/from Stand;Stand Pivot Transfers Sit to Stand: Mod assist Stand pivot transfers: Min assist;Mod assist (stand step turn bed to recliner with B platform RW)       General transfer comment: x1 trial standing from bed and x1 trial standing from recliner; vc's for UE/LE placement; assist to initiate and come to full stand; assist to control descent sitting  Ambulation/Gait Ambulation/Gait assistance: Min assist;Mod assist;+2 physical assistance;+2 safety/equipment (chair follow) Gait Distance (Feet): 30 Feet Assistive device: Bilateral platform walker   Gait velocity: decreased   General Gait Details: first 20 feet pt able to advance B LE's (decreased step length and foot clearance L LE compared to R LE); vc's to increase BOS; assist to advance walker; last 10 feet pt requiring min assist to  advance L LE with cueing for technique; min to mod assist x2 for ambulation plus chair follow   Stairs             Wheelchair Mobility    Modified Rankin (Stroke Patients Only)       Balance Overall balance  assessment: Needs assistance Sitting-balance support: No upper extremity supported;Feet supported Sitting balance-Leahy Scale: Fair Sitting balance - Comments: pt steady with static sitting but close SBA for any reaching within BOS   Standing balance support: Bilateral upper extremity supported Standing balance-Leahy Scale: Poor Standing balance comment: pt requiring B UE support on B platform walker for standing balance with assist for safety                            Cognition Arousal/Alertness: Awake/alert Behavior During Therapy: WFL for tasks assessed/performed Overall Cognitive Status: Within Functional Limits for tasks assessed                                 General Comments: Pt very motivated to participate in therapy.      Exercises      General Comments   Nursing cleared pt for participation in physical therapy.  Pt agreeable to PT session.      Pertinent Vitals/Pain Pain Assessment: 0-10 Pain Score: 2  Pain Location: neck pain Pain Descriptors / Indicators: Sore Pain Intervention(s): Limited activity within patient's tolerance;Monitored during session;Repositioned;Premedicated before session    Home Living                      Prior Function            PT Goals (current goals can now be found in the care plan section) Acute Rehab PT Goals Patient Stated Goal: Rehab then back home PT Goal Formulation: With patient Time For Goal Achievement: 07/19/20 Potential to Achieve Goals: Good Progress towards PT goals: Progressing toward goals    Frequency    BID      PT Plan Current plan remains appropriate    Co-evaluation              AM-PAC PT "6 Clicks" Mobility   Outcome Measure  Help needed turning from your back to your side while in a flat bed without using bedrails?: A Little Help needed moving from lying on your back to sitting on the side of a flat bed without using bedrails?: A Lot Help needed moving  to and from a bed to a chair (including a wheelchair)?: A Lot Help needed standing up from a chair using your arms (e.g., wheelchair or bedside chair)?: A Lot Help needed to walk in hospital room?: Total Help needed climbing 3-5 steps with a railing? : Total 6 Click Score: 11    End of Session Equipment Utilized During Treatment: Gait belt Activity Tolerance: Patient tolerated treatment well Patient left: in chair;with call bell/phone within reach;with chair alarm set;Other (comment) (B heels floating via pillow support) Nurse Communication: Mobility status;Precautions PT Visit Diagnosis: Other abnormalities of gait and mobility (R26.89);Unsteadiness on feet (R26.81);Muscle weakness (generalized) (M62.81);History of falling (Z91.81);Difficulty in walking, not elsewhere classified (R26.2)     Time: 5643-3295 PT Time Calculation (min) (ACUTE ONLY): 44 min  Charges:  $Gait Training: 8-22 mins $Therapeutic Activity: 23-37 mins  Hendricks Limes, PT 07/10/20, 1:28 PM

## 2020-07-10 NOTE — Progress Notes (Signed)
[facility]  - [place] at [facility]   PATIENT NAME: Yvette Phillips    MR#:  [id number]  DATE OF BIRTH:  12-02-1960  SUBJECTIVE:   Working with physical therapy. Denies much pain. Overall slowly improving with UE strength Very motivated to work with PT.  REVIEW OF SYSTEMS:   Review of Systems  Constitutional: Negative for chills, fever and weight loss.  HENT: Negative for ear discharge, ear pain and nosebleeds.   Eyes: Negative for blurred vision, pain and discharge.  Respiratory: Positive for cough. Negative for sputum production, shortness of breath, wheezing and stridor.   Cardiovascular: Negative for chest pain, palpitations, orthopnea and PND.  Gastrointestinal: Negative for abdominal pain, diarrhea, nausea and vomiting.  Genitourinary: Negative for frequency and urgency.  Musculoskeletal: Negative for back pain.  Neurological: Positive for weakness. Negative for sensory change, speech change and focal weakness.  Psychiatric/Behavioral: Negative for depression and hallucinations. The patient is not nervous/anxious.    Tolerating Diet:yesTolerating PT: CIR  DRUG ALLERGIES:   Allergies  Allergen Reactions  . Codeine Anaphylaxis  . Penicillins Anaphylaxis    VITALS:  Blood pressure 121/66, pulse 89, temperature 98.6 F (37 C), resp. rate 17, height 5\' 7"  (1.702 m), weight 104.3 kg, SpO2 99 %.  PHYSICAL EXAMINATION:   Physical Exam  GENERAL:  60 y.o.-year-old patient lying in the bed with no acute distress.  LUNGS: Normal breath sounds bilaterally, no wheezing, rales, rhonchi. No use of accessory muscles of respiration.  CARDIOVASCULAR: S1, S2 normal. No murmurs, rubs, or gallops.  ABDOMEN: Soft, nontender, nondistended. Bowel sounds present. No organomegaly or mass.  EXTREMITIES: No cyanosis, clubbing or edema b/l.    NEUROLOGIC: Cranial nerves II through XII are intact. bilateral weakness UE and LE 4/5 PSYCHIATRIC:  patient is alert and oriented x 3.   SKIN:  Pressure Injury 07/04/20 Buttocks Left Stage 2 -  Partial thickness loss of dermis presenting as a shallow open injury with a red, pink wound bed without slough. (Active)  07/04/20 0030  Location: Buttocks  Location Orientation: Left  Staging: Stage 2 -  Partial thickness loss of dermis presenting as a shallow open injury with a red, pink wound bed without slough.  Wound Description (Comments):   Present on Admission: Yes        LABORATORY PANEL:  CBC Recent Labs  Lab 07/04/20 0519  WBC 7.5  HGB 13.4  HCT 39.7  PLT 263    Chemistries  Recent Labs  Lab 07/04/20 0519 07/07/20 0504 07/09/20 0456  NA 137 137 136  K 3.4* 4.2 4.0  CL 100 99 99  CO2 26 27 27   GLUCOSE 83 89 92  BUN 11 9 10   CREATININE 0.83 0.75 0.78  CALCIUM 9.2 9.3 9.4  MG 1.5* 1.9  --   AST 22  --   --   ALT 13  --   --   ALKPHOS 53  --   --   BILITOT 1.3*  --   --    Cardiac Enzymes No results for input(s): TROPONINI in the last 168 hours. RADIOLOGY:  No results found. ASSESSMENT AND PLAN:  Yvette Phillips is a 60 y.o. female presents to the ER via EMS after DSS can evaluate patient for on the floor.  Is having frequent falls not able to care for her himself as she lives at home alone.  Patient was admitted to [facility] in early January found to have Covid as well as MRI suggesting central cord syndrome.  Was evaluate by neurosurgery and discharged home for outpatient follow-up.  Large disc herniation at C4-5, Central cord syndrome with bilateral upper and lower extremity weakness:  --07/04/20-- status post anterior cervical decompression/discectomy by Dr Marcell Barlow -- physical therapy and Dr Myer Haff Recommends CIR/acute rehab ---- patient working daily with physical therapy showing some improvement. She will benefit from acute inpatient rehab. --pt progressing well with daily in house PT. She has been accepted by CIR at [facility]. Awaiting for bed availability.  Dysphagia -- seen by speech  therapy. Appears primarily esophageal suspected due to postop inflammation. --  barium swallow as per recommendation--Normal esophageal motility  Hypokalemia and hypomagnesemia: Repleted  Polysubstance abuse (cocaine, cannabinoids, alcohol, tobacco): She has been counseled to quit.  Ativan as needed for alcohol withdrawal syndrome.  Elevated blood pressure without diagnosis of hypertension -- PRN IV hydralazine. Will continue to monitor -BP stable  Pressure Injury 07/04/20 Buttocks Left Stage 2 -  Partial thickness loss of dermis presenting as a shallow open injury with a red, pink wound bed without slough. (Active)  07/04/20 0030  Location: Buttocks  Location Orientation: Left  Staging: Stage 2 -  Partial thickness loss of dermis presenting as a shallow open injury with a red, pink wound bed without slough.  Wound Description (Comments):   Present on Admission: Yes    Procedures: anterior cervical decompression/discectomy Family communication : sister Alona Bene  Consults : neurosurgery CODE STATUS: full code DVT Prophylaxis : heparin Level of care: Med-Surg Status is: Inpatient  Remains inpatient appropriate because:Unsafe d/c plan   Dispo: The patient is from: Home              Anticipated d/c is to: CIR              Anticipated d/c date is:TBD              Patient currently is medically stable to d/c.   Difficult to place patient No -- Physical therapy/Neurosx  recommends CIR. --per TOC /CIR coordinator--[name] has very limited beds  -- Will continue PT while patient is in house.       TOTAL TIME TAKING CARE OF THIS PATIENT: 20 minutes.  >50% time spent on counselling and coordination of care  Note: This dictation was prepared with Dragon dictation along with smaller phrase technology. Any transcriptional errors that result from this process are unintentional.  Enedina Finner M.D    [facility]   CC: Primary care physician; Patient, No Pcp PerPatient  ID: Yvette Phillips, female   DOB: 10-Nov-1960, 60 y.o.   MRN: [id number]

## 2020-07-10 NOTE — Progress Notes (Signed)
Inpatient Rehabilitation Admissions Coordinator    I spoke with patient by phone to confirm her plans for a possible CIR admit pending bed availability. We reviewed cost of care and she states she has applied for disability and Medicaid. I await bed availability to admit her. I have updated acute team and TOC.   Ottie Glazier, RN, MSN Rehab Admissions Coordinator [phone] 07/10/2020 12:17 PM

## 2020-07-10 NOTE — H&P (Signed)
Physical Medicine and Rehabilitation Admission H&P    Chief Complaint  Patient presents with  . Functional deficits due to central cord syndrome.    HPI: Yvette Phillips is a 60 year old female with history of fall around christmas with progressive weakness and admitted [facility] 12/27- 05/26/20 with work up revealing C4/C5 disc herniation with severe spinal stenosis and cord compression, central cord syndrome, hypokalemia as well as mild Covid 19 infection.  History taken from chart review and patient.  She was set for outpatient neurosurgery follow up but continued to have BUE/BLE weakness, sustained a fall the evening before.  Her boyfriend put her to bed and called EMS the next day.  She was found after a well check and admitted to [facility] on 07/03/2020.  Urine drug screen positive cocaine and THC.  She was found to have electrolyte abnormality with potassium 3.4, Mg 1.5 and CK-375.  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 MRI C spine showed large central disc protrusion at C4-5 with severe spinal stenosis and compressive myelopathy as well as severe right C4/C5 foraminal stenosis. Right knee contusion noted and X rays negative for fracture.    Neurosurgery evaluated patient with exam revealing sensory deficits bilateral hands below forearm and bilateral lower extremities as well as 1/5 strength bilateral grip/wrist extension and 4/5 left biceps/triceps/iliopsoas/quads. MAPs. >80s recommended and she was taken to OR on 07/04/2020 for ACDF of C4/C5  by  Dr.  Myer Haff.  Hospital course further complicated by resulting post op diffuse weakness a s well as difficulty with swallowing.  Barium swallow done 02/11 due to difficulty swallowing and was negative for mas, stricture or ulceration. She was evaluated by ST and started on dysphagia 2, thins recommended with assistance for feeding. Therapy ongoing and patient limited by BLE/BUE weakness with difficulty standing and staggering gait, is  unable to feed herself or perform ADLs. CIR recommended due to functional decline.  Please see preadmission assessment from earlier today as well.  Review of Systems  Constitutional: Negative for chills and fever.  HENT: Negative for hearing loss.   Eyes: Negative for blurred vision and double vision.  Respiratory: Negative for cough and shortness of breath.   Cardiovascular: Negative for chest pain and palpitations.  Gastrointestinal: Positive for constipation (finally had BM yesterday--after 3 weeks). Negative for heartburn and nausea.  Genitourinary: Negative for dysuria.  Musculoskeletal: Positive for joint pain (left knee) and myalgias.  Skin: Negative for itching and rash.  Neurological: Positive for tingling, sensory change, focal weakness and weakness (LLE>RLE weakness. ).       Spasms LLE>RLE.   Psychiatric/Behavioral: Negative for depression. The patient is not nervous/anxious.   All other systems reviewed and are negative.    Past medical history: None   Past Surgical History:  Procedure Laterality Date  . ANTERIOR CERVICAL DECOMP/DISCECTOMY FUSION N/A 07/04/2020   Procedure: ANTERIOR CERVICAL DECOMPRESSION/DISCECTOMY FUSION 1 LEVEL;  Surgeon: Venetia Night, MD;  Location: [facility] ORS;  Service: Neurosurgery;  Laterality: N/A;    Family History  Problem Relation Age of Onset  . Diabetes Mother   . Diabetes Father   . Heart disease Neg Hx     Social History:  Lives alone. Was working prior to Christmas and has been essentially non-ambulatory since fall in Dec. Her boygriend has been assisting to get OOB to chair daily. She could stand with walker initially.   She reports that she has been smoking--1/2 PPD. She has never used smokeless tobacco.  She reports current alcohol us--mixed drink and beer after work. She smokes cocaine twice a day and uses marijuana once a day--no IV drug use.    Allergies  Allergen Reactions  . Codeine Anaphylaxis  . Penicillins Anaphylaxis    Medications Prior to Admission  Medication Sig Dispense Refill  . acetaminophen (TYLENOL) 325 MG tablet Take 2 tablets (650 mg total) by mouth every 6 (six) hours as needed for mild pain (or Fever >/= 101).    Marland Kitchen alum & mag hydroxide-simeth (MAALOX/MYLANTA) 200-200-20 MG/5ML suspension Take 30 mLs by mouth every 6 (six) hours as needed for indigestion or heartburn. 355 mL 0  . docusate sodium (COLACE) 100 MG capsule Take 1 capsule (100 mg total) by mouth 2 (two) times daily. 10 capsule 0  . [START ON 07/12/2020] enoxaparin (LOVENOX) 60 MG/0.6ML injection Inject 0.525 mLs (52.5 mg total) into the skin daily. 0 mL   . famotidine (PEPCID) 20 MG tablet Take 1 tablet (20 mg total) by mouth at bedtime.    Melene Muller ON 07/12/2020] folic acid (FOLVITE) 1 MG tablet Take 1 tablet (1 mg total) by mouth daily.    Melene Muller ON 07/12/2020] Multiple Vitamin (MULTIVITAMIN WITH MINERALS) TABS tablet Take 1 tablet by mouth daily.    Melene Muller ON 07/12/2020] nicotine (NICODERM CQ - DOSED IN MG/24 HOURS) 14 mg/24hr patch Place 1 patch (14 mg total) onto the skin daily. 28 patch 0  . [START ON 07/12/2020] thiamine 100 MG tablet Take 1 tablet (100 mg total) by mouth daily.      Drug Regimen Review  Drug regimen was reviewed and remains appropriate with no significant issues identified  Home: Home Living Family/patient expects to be discharged to:: Private residence Living Arrangements: Alone Available Help at Discharge: Family,Other (Comment) Type of Home: Apartment Home Access: Stairs to enter Entrance Stairs-Number of Steps: 5 Entrance Stairs-Rails: Right,Left Home Layout: One level Bathroom Shower/Tub: Health visitor: Standard Bathroom Accessibility: Yes Home Equipment: Walker - 2 wheels,Walker - 4 wheels Additional Comments: Pt reports living in studio apt/duplex.  Lives With: Alone   Functional History: Prior Function Level of Independence: Needs assistance Gait / Transfers Assistance  Needed: Pt reports that prior to her initial fall in November or DEcember of 2021, that she was walking with no device. States that since that time, she has been struggling and needing walker or hand-held assist from her significant other to stand and pivot. States she has not really walked since before chistmas time. However, does not mention wheelchair use. ADL's / Homemaking Assistance Needed: States she has been needing assist with several ADLs since fall in December/November 2021 from her significant other. 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  Since then pt has been using RW but most recently pt's friend (who lives down street) has been assisting her OOB and to get into chair (hasn't been able to walk with RW).  Functional Status:  Mobility: Bed Mobility Overal bed mobility: Needs Assistance Bed Mobility: Rolling,Sidelying to Sit Rolling: Supervision Sidelying to sit: Mod assist Supine to sit: Max assist Sit to supine: Mod assist,Max assist,+2 for physical assistance Sit to sidelying: Mod assist (vc's for technique; assist for B LE's) General bed mobility comments: assist for trunk R sidelying to sitting;  vc's for technique Transfers Overall transfer level: Needs assistance Equipment used: Bilateral platform walker Transfers: Sit to/from [employer] Transfers Sit to Stand:  (mod assist x1 to stand from bed; min to mod assist x2 to stand 1st trial from recliner; mod to max assist x1 to stand 2nd trial from recliner) Stand pivot transfers: Min assist,Mod assist (stand step turn bed to recliner) General transfer comment: vc's for UE/LE placement; improved ability to scoot towards edge of sitting surface to position for standing (intermittent  cueing for technique) Ambulation/Gait Ambulation/Gait assistance: Min assist,Mod assist,+2 physical assistance Gait Distance (Feet):  (20 feet; 10 feet) Assistive device: Bilateral platform walker Gait Pattern/deviations: Step-to pattern,Staggering left,Staggering right,Trunk flexed,Narrow base of support,Decreased weight shift to left,Decreased step length - left,Decreased step length - right General Gait Details: decreased step length and foot clearance L LE compared to R LE; pt took a couple standing breaks when having difficulty advancing L LE and then able to walk a little further; assist to advance walker; chair follow; vc's to increased BOS Gait velocity: decreased    ADL: ADL Overall ADL's : Needs assistance/impaired Eating/Feeding: Set up,With adaptive utensils,Cueing for safety,Cueing for sequencing Eating/Feeding Details (indicate cue type and reason): Pt provided with built-up handles x2 to support functional independence with self-feeding. She is able to return demonstrate understanding of safe use of built up handle using a spoon to scoop ice cubes x10. Pt able to return demo understanding for how to apply handles to utensils with min cueing for technique for use of her LUE. Pt demos a refined tripod grasp when using built up handle and spoon in her RUE. She is eager to trial at her next meal time. Grooming Details (indicate cue type and reason): Pt educated on safe use of built up handle for grooming tasks as well as self-feeding this date. Functional mobility during ADLs: +2 for physical assistance,Cueing for sequencing,Rolling walker General ADL Comments: Pt requires MIN/MOD A for bed level or seated UB ADLs d/t decreased ROM and FMC to hold objects such as a washcloth. Pt requires MAX to TOTAL A for bed level or seated LB ADLs and cannot tolerate participation in standing self care at this time d/t very poor standing balance requiring 2p assist for transfers, weight shift and to  sustain static stand.  Cognition: Cognition Overall Cognitive Status: Within Functional Limits for tasks assessed Orientation Level: Oriented X4 Cognition Arousal/Alertness: Awake/alert Behavior During Therapy: WFL for tasks assessed/performed Overall Cognitive Status: Within Functional Limits for tasks assessed General Comments: Pt very motivated to participate in therapy.   Blood pressure (!) 155/95, pulse 88, temperature 98.3 F (36.8 C), resp. rate 17, height 5\' 7"  (1.702 m), weight 104.3 kg, SpO2 100 %. Physical Exam Vitals and nursing note reviewed.  Constitutional:      General: She is not in acute distress.    Appearance: Normal appearance.  HENT:     Head: Normocephalic and atraumatic.     Right Ear: External ear normal.     Left Ear: External ear normal.     Nose: Nose normal.  Eyes:     General:        Right eye: No discharge.        Left eye: No discharge.     Extraocular Movements: Extraocular movements intact.  Cardiovascular:     Rate and Rhythm: Normal rate and regular rhythm.  Pulmonary:     Effort: Pulmonary effort is normal. No respiratory distress.     Breath sounds: No  stridor.  Abdominal:     General: There is distension.     Comments: Hypoactive bowel sounds  Musculoskeletal:     Cervical back: Normal range of motion and neck supple.     Comments: Left knee pain with flexion.  Left knee with tenderness and mild edema   Skin:    General: Skin is warm and dry.     Comments: Tinea pedis bilaterally with cracked right heel and thick toe nails.    Neurological:     Mental Status: She is alert and oriented to person, place, and time.     Comments: Alert Motor: Bilateral upper extremities: Shoulder abduction, elbow flexion/extension 4/5, wrist extension 3+/5, and intrinsics 2/5 Right lower extremity: Hip flexion, knee extension 2+/5, ankle dorsiflexion 4/5 Left lower extremity: Hip flexion, knee extension 3 -/5, ankle dorsiflexion 4/5 Sensation to  light touch diminished distal to elbows and knees  Psychiatric:        Mood and Affect: Mood normal.        Behavior: Behavior normal.     Results for orders placed or performed during the hospital encounter of 07/03/20 (from the past 48 hour(s))  CBC     Status: Abnormal   Collection Time: 07/11/20  4:51 AM  Result Value Ref Range   WBC 6.2 4.0 - 10.5 K/uL   RBC 3.59 (L) 3.87 - 5.11 MIL/uL   Hemoglobin 12.6 12.0 - 15.0 g/dL   HCT 14.4 31.5 - 40.0 %   MCV 107.0 (H) 80.0 - 100.0 fL   MCH 35.1 (H) 26.0 - 34.0 pg   MCHC 32.8 30.0 - 36.0 g/dL   RDW 86.7 61.9 - 50.9 %   Platelets 331 150 - 400 K/uL   nRBC 0.0 0.0 - 0.2 %    Comment: Performed at [facility], [address] [place], [place] [address]   No results found.     Medical Problem List and Plan: 1.  Diffuse weakness, difficulty standing, staggering gait secondary to cervical myelopathy.  -patient may shower  -ELOS/Goals: 14 to 17 days/supervision/min a  Admit to CIR 2.  Antithrombotics: -DVT/anticoagulation:  Pharmaceutical: Lovenox  -antiplatelet therapy: N/A 3. Pain Management: Oxycodone prn.   Monitor with increased exertion, particularly neuropathic pain 4. Mood: LCSW to follow for evaluation and support.   -antipsychotic agents: N/A 5. Neuropsych: This patient is capable of making decisions on her own behalf. 6. Skin/Wound Care:  Monitor incision for healing.  Added protein supplement to help promote healing.  7. Fluids/Electrolytes/Nutrition: Monitor I/Os.   CMP ordered for tomorrow 8. ABLA:   CBC ordered for tomorrow 9. Hypokalemia/Hypomagnesemia: Has resolved with brief supplementation.   Labs ordered for tomorrow 10. Macrocytosis/Polysubstance abuse:   Vitamin B12/folate levels ordered.  11. Constipation: KUB ordered to determine stool burden.   Jacquelynn Cree, PA-C 07/11/2020  I have personally performed a face to face diagnostic evaluation, including, but not limited to relevant history  and physical exam findings, of this patient and developed relevant assessment and plan.  Additionally, I have reviewed and concur with the physician assistant's documentation above.  Maryla Morrow, MD, ABPMR

## 2020-07-10 NOTE — Consult Note (Signed)
Physical Medicine and Rehabilitation Consult Reason for Consult: Central cord syndrome Referring Physician: Enedina Finner, MD   HPI: Yvette Phillips is a 60 y.o. female who presented to the hospital on 07/03/20. She fell on Christmas Eve in her apartment and since then experienced severe weakness of both arms and legs. Patient was brought by EMS to [facility] on 12/27 and hospitalized until 1/1 for central cord syndrome. She also had incidental mild COVID-19. Upon discharge she had worsening of symptoms. She had another fall and was admitted to [facility]. Imaging was negative for an acute fracture of her left knee. MRI brain was negative for acute intracranial abnormality. 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 Physical Medicine & Rehabilitation was consulted to assess candidacy for CIR given diffuse weakness impairing mobility and ADLs.    Review of Systems  Constitutional: Negative.   HENT: Negative.   Eyes: Negative.   Respiratory: Negative.   Cardiovascular: Negative.   Gastrointestinal: Negative.   Genitourinary: Negative.   Musculoskeletal: Negative.   Skin: Negative.   Neurological: Positive for focal weakness. Negative for sensory change.  Endo/Heme/Allergies: Negative.   Psychiatric/Behavioral: Negative.    History reviewed. No pertinent past medical history. Past Surgical History:  Procedure Laterality Date  . ANTERIOR CERVICAL DECOMP/DISCECTOMY FUSION N/A 07/04/2020   Procedure: ANTERIOR CERVICAL DECOMPRESSION/DISCECTOMY FUSION 1 LEVEL;  Surgeon: Venetia Night, MD;  Location: [facility] ORS;  Service: Neurosurgery;  Laterality: N/A;   Family History  Problem Relation Age of Onset  . Heart disease Neg Hx    Social History:  reports that she has been smoking.  She has never used smokeless tobacco. She reports current alcohol use. She reports previous drug use. Allergies:  Allergies  Allergen Reactions  . Codeine Anaphylaxis  . Penicillins Anaphylaxis   No medications prior to admission.    Home: Home Living Family/patient expects to be discharged to:: Private residence Living Arrangements: Alone Available Help at Discharge: Family,Other (Comment) (sister, boyfriend) Type of Home: Apartment Home Access: Stairs to enter Entrance Stairs-Number of Steps: 5 Entrance Stairs-Rails: Right,Left Home Layout: One level Bathroom Shower/Tub: Health visitor: Standard Bathroom Accessibility: Yes Home Equipment: Walker - 2 wheels,Walker - 4 wheels Additional Comments: Pt reports living in studio apt/duplex.  Functional History: Prior Function Level of Independence: Needs assistance Gait / Transfers Assistance Needed: Pt reports that prior to her initial fall in November or DEcember of 2021, that she was walking with no device. States that since that time, she has been struggling and needing walker or hand-held assist from her significant other to stand and pivot. States she has not really walked since before chistmas time. However, does not mention wheelchair use. ADL's / Homemaking Assistance Needed: States she has been needing assist with several ADLs since fall in December/November 2021 from her significant other. 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  Since then pt has been using RW but most recently pt's friend (who lives down street) has been assisting her OOB and to get into chair (hasn't been able to walk with RW).  Functional Status:  Mobility: Bed Mobility Overal bed mobility: Needs  Assistance Bed Mobility: Rolling,Sidelying to Sit Rolling: Supervision (rolling R and L for clean-up d/t bowel incontinence; use of bed rail) Sidelying to sit: Mod assist Supine to sit: Max assist Sit to supine: Mod assist,Max assist,+2 for physical assistance Sit to sidelying: +2 for safety/equipment,Max assist General bed mobility comments: assist for trunk R sidelying to sitting Transfers Overall transfer level: Needs assistance Equipment used: Bilateral platform walker Transfers: Sit to/from Stand,Stand Pivot Transfers Sit to Stand: Mod assist Stand pivot transfers: Min assist,Mod assist (stand step turn bed to recliner with B platform RW) General transfer comment: x1 trial standing from bed and x1 trial standing from recliner; vc's for UE/LE placement; assist to initiate and come to full stand; assist to control descent sitting Ambulation/Gait Ambulation/Gait assistance: Min assist,Mod assist,+2 physical assistance,+2 safety/equipment (chair follow) Gait Distance (Feet): 30 Feet Assistive device: Bilateral platform walker Gait Pattern/deviations: Step-to pattern,Staggering left,Staggering right,Trunk flexed,Narrow base of support,Decreased weight shift to left,Decreased step length - left,Decreased step length - right General Gait Details: first 20 feet pt able to advance B LE's (decreased step length and foot clearance L LE compared to R LE); vc's to increase BOS; assist to advance walker; last 10 feet pt requiring min assist to advance L LE with cueing for technique; min to mod assist x2 for ambulation plus chair follow Gait velocity: decreased    ADL: ADL Overall ADL's : Needs assistance/impaired Eating/Feeding: Set up,With adaptive utensils,Cueing for safety,Cueing for sequencing Eating/Feeding Details (indicate cue type and reason): Pt provided with built-up handles x2 to support functional independence with self-feeding. She is able to return demonstrate understanding of safe  use of built up handle using a spoon to scoop ice cubes x10. Pt able to return demo understanding for how to apply handles to utensils with min cueing for technique for use of her LUE. Pt demos a refined tripod grasp when using built up handle and spoon in her RUE. She is eager to trial at her next meal time. Grooming Details (indicate cue type and reason): Pt educated on safe use of built up handle for grooming tasks as well as self-feeding this date. Functional mobility during ADLs: +2 for physical assistance,Cueing for sequencing,Rolling walker General ADL Comments: Pt requires MIN/MOD A for bed level or seated UB ADLs d/t decreased ROM and FMC to hold objects such as a washcloth. Pt requires MAX to TOTAL A for bed level or seated LB ADLs and cannot tolerate participation in standing self care at this time d/t very poor standing balance requiring 2p assist for transfers, weight shift and to sustain static stand.  Cognition: Cognition Overall Cognitive Status: Within Functional Limits for tasks assessed Orientation Level: Oriented X4 Cognition Arousal/Alertness: Awake/alert Behavior During Therapy: WFL for tasks assessed/performed Overall Cognitive Status: Within Functional Limits for tasks assessed General Comments: Pt very motivated to participate in therapy.  Blood pressure 121/66, pulse 89, temperature 98.6 F (37 C), resp. rate 17, height 5\' 7"  (1.702 m), weight 104.3 kg, SpO2 99 %. Physical Exam  Gen: no distress, normal appearing HEENT: oral mucosa pink and moist, NCAT, poor dentition Cardio: Reg rate Chest: normal effort, normal rate of breathing Abd: soft, non-distended Ext: no edema Psych: pleasant, normal affect Skin: incision C/D/I Neuro: Alert and oriented x3 Musculoskeletal: RUE: 5/5 throughout LUE: 4/5 EF, EE, 3/5 WE and hand grip RLE: 5/5 throughout LLE: 4/5 throughout  No results found for this or any previous visit (from the past  24 hour(s)). No results  found.   Assessment/Plan: Diagnosis: Cervical myelopathy s/p ACDF C4-C5 on 07/04/20 1. Does the need for close, 24 hr/day medical supervision in concert with the patient's rehab needs make it unreasonable for this patient to be served in a less intensive setting? Yes 2. Co-Morbidities requiring supervision/potential complications:  1. Alcohol abuse: will require counseling closer to discharge 2. Polysubstance abuse 3. Nicotine dependence 4. Pressure injury of skin: will require daily monitoring, wound care, and repositioning 5. Electrolyte abnormalities: will require monitoring of K+ and Mg+ 3. Due to bladder management, bowel management, safety, skin/wound care, disease management, medication administration, pain management and patient education, does the patient require 24 hr/day rehab nursing? Yes 4. Does the patient require coordinated care of a physician, rehab nurse, therapy disciplines of PT, OT, SLP to address physical and functional deficits in the context of the above medical diagnosis(es)? Yes Addressing deficits in the following areas: balance, endurance, locomotion, strength, transferring, bowel/bladder control, bathing, dressing, feeding, grooming, toileting, cognition, swallowing and psychosocial support 5. Can the patient actively participate in an intensive therapy program of at least 3 hrs of therapy per day at least 5 days per week? Yes 6. The potential for patient to make measurable gains while on inpatient rehab is excellent 7. Anticipated functional outcomes upon discharge from inpatient rehab are modified independent  with PT, modified independent with OT, modified independent with SLP. 8. Estimated rehab length of stay to reach the above functional goals is: 10-12 days 9. Anticipated discharge destination: Home 10. Overall Rehab/Functional Prognosis: excellent  RECOMMENDATIONS: This patient's condition is appropriate for continued rehabilitative care in the following  setting: CIR Patient has agreed to participate in recommended program. Yes Note that insurance prior authorization may be required for reimbursement for recommended care.  Comment: Thank you for this consult. Admission coordinator to follow.   I have personally performed a face to face diagnostic evaluation, including, but not limited to relevant history and physical exam findings, of this patient and developed relevant assessment and plan.  Additionally, I have reviewed and concur with the physician assistant's documentation above.  Sula Soda, MD

## 2020-07-11 ENCOUNTER — Encounter (HOSPITAL_COMMUNITY): Payer: Self-pay | Admitting: Physical Medicine and Rehabilitation

## 2020-07-11 ENCOUNTER — Other Ambulatory Visit: Payer: Self-pay

## 2020-07-11 ENCOUNTER — Inpatient Hospital Stay (HOSPITAL_COMMUNITY)
Admission: RE | Admit: 2020-07-11 | Discharge: 2020-08-21 | DRG: 945 | Disposition: A | Discharge status: Skilled Nursing Facility | Payer: Medicaid Other | Source: Other Acute Inpatient Hospital | Attending: Physical Medicine and Rehabilitation | Admitting: Physical Medicine and Rehabilitation

## 2020-07-11 ENCOUNTER — Inpatient Hospital Stay (HOSPITAL_COMMUNITY): Payer: Medicaid Other

## 2020-07-11 ENCOUNTER — Encounter: Payer: Self-pay | Admitting: Internal Medicine

## 2020-07-11 DIAGNOSIS — R531 Weakness: Secondary | ICD-10-CM | POA: Diagnosis not present

## 2020-07-11 DIAGNOSIS — F1490 Cocaine use, unspecified, uncomplicated: Secondary | ICD-10-CM | POA: Diagnosis present

## 2020-07-11 DIAGNOSIS — Z880 Allergy status to penicillin: Secondary | ICD-10-CM

## 2020-07-11 DIAGNOSIS — R600 Localized edema: Secondary | ICD-10-CM | POA: Diagnosis present

## 2020-07-11 DIAGNOSIS — K5900 Constipation, unspecified: Secondary | ICD-10-CM | POA: Diagnosis present

## 2020-07-11 DIAGNOSIS — Z741 Need for assistance with personal care: Secondary | ICD-10-CM | POA: Diagnosis present

## 2020-07-11 DIAGNOSIS — Z885 Allergy status to narcotic agent status: Secondary | ICD-10-CM

## 2020-07-11 DIAGNOSIS — M21371 Foot drop, right foot: Secondary | ICD-10-CM | POA: Diagnosis present

## 2020-07-11 DIAGNOSIS — R42 Dizziness and giddiness: Secondary | ICD-10-CM | POA: Diagnosis not present

## 2020-07-11 DIAGNOSIS — Z8616 Personal history of COVID-19: Secondary | ICD-10-CM | POA: Diagnosis not present

## 2020-07-11 DIAGNOSIS — L84 Corns and callosities: Secondary | ICD-10-CM | POA: Diagnosis present

## 2020-07-11 DIAGNOSIS — F1290 Cannabis use, unspecified, uncomplicated: Secondary | ICD-10-CM | POA: Diagnosis present

## 2020-07-11 DIAGNOSIS — F1910 Other psychoactive substance abuse, uncomplicated: Secondary | ICD-10-CM

## 2020-07-11 DIAGNOSIS — Y92239 Unspecified place in hospital as the place of occurrence of the external cause: Secondary | ICD-10-CM | POA: Diagnosis not present

## 2020-07-11 DIAGNOSIS — M25562 Pain in left knee: Secondary | ICD-10-CM | POA: Diagnosis present

## 2020-07-11 DIAGNOSIS — G8250 Quadriplegia, unspecified: Secondary | ICD-10-CM | POA: Diagnosis present

## 2020-07-11 DIAGNOSIS — M62838 Other muscle spasm: Secondary | ICD-10-CM | POA: Diagnosis present

## 2020-07-11 DIAGNOSIS — Z20822 Contact with and (suspected) exposure to covid-19: Secondary | ICD-10-CM | POA: Diagnosis present

## 2020-07-11 DIAGNOSIS — K592 Neurogenic bowel, not elsewhere classified: Secondary | ICD-10-CM | POA: Diagnosis present

## 2020-07-11 DIAGNOSIS — S8001XD Contusion of right knee, subsequent encounter: Secondary | ICD-10-CM

## 2020-07-11 DIAGNOSIS — R609 Edema, unspecified: Secondary | ICD-10-CM

## 2020-07-11 DIAGNOSIS — N319 Neuromuscular dysfunction of bladder, unspecified: Secondary | ICD-10-CM | POA: Diagnosis present

## 2020-07-11 DIAGNOSIS — Z888 Allergy status to other drugs, medicaments and biological substances status: Secondary | ICD-10-CM

## 2020-07-11 DIAGNOSIS — Z23 Encounter for immunization: Secondary | ICD-10-CM

## 2020-07-11 DIAGNOSIS — Z981 Arthrodesis status: Secondary | ICD-10-CM

## 2020-07-11 DIAGNOSIS — W19XXXD Unspecified fall, subsequent encounter: Secondary | ICD-10-CM | POA: Diagnosis present

## 2020-07-11 DIAGNOSIS — S14124D Central cord syndrome at C4 level of cervical spinal cord, subsequent encounter: Principal | ICD-10-CM

## 2020-07-11 DIAGNOSIS — F1010 Alcohol abuse, uncomplicated: Secondary | ICD-10-CM

## 2020-07-11 DIAGNOSIS — L509 Urticaria, unspecified: Secondary | ICD-10-CM | POA: Diagnosis not present

## 2020-07-11 DIAGNOSIS — G959 Disease of spinal cord, unspecified: Secondary | ICD-10-CM | POA: Diagnosis present

## 2020-07-11 DIAGNOSIS — H00013 Hordeolum externum right eye, unspecified eyelid: Secondary | ICD-10-CM | POA: Diagnosis not present

## 2020-07-11 DIAGNOSIS — L853 Xerosis cutis: Secondary | ICD-10-CM | POA: Diagnosis present

## 2020-07-11 DIAGNOSIS — D7589 Other specified diseases of blood and blood-forming organs: Secondary | ICD-10-CM | POA: Diagnosis present

## 2020-07-11 DIAGNOSIS — G629 Polyneuropathy, unspecified: Secondary | ICD-10-CM | POA: Diagnosis present

## 2020-07-11 DIAGNOSIS — E876 Hypokalemia: Secondary | ICD-10-CM | POA: Diagnosis present

## 2020-07-11 DIAGNOSIS — M7989 Other specified soft tissue disorders: Secondary | ICD-10-CM | POA: Diagnosis not present

## 2020-07-11 DIAGNOSIS — F17210 Nicotine dependence, cigarettes, uncomplicated: Secondary | ICD-10-CM | POA: Diagnosis present

## 2020-07-11 DIAGNOSIS — D62 Acute posthemorrhagic anemia: Secondary | ICD-10-CM | POA: Diagnosis present

## 2020-07-11 DIAGNOSIS — Z7151 Drug abuse counseling and surveillance of drug abuser: Secondary | ICD-10-CM | POA: Diagnosis not present

## 2020-07-11 DIAGNOSIS — R1310 Dysphagia, unspecified: Secondary | ICD-10-CM

## 2020-07-11 DIAGNOSIS — G8929 Other chronic pain: Secondary | ICD-10-CM | POA: Diagnosis present

## 2020-07-11 DIAGNOSIS — Z833 Family history of diabetes mellitus: Secondary | ICD-10-CM

## 2020-07-11 DIAGNOSIS — R350 Frequency of micturition: Secondary | ICD-10-CM | POA: Diagnosis present

## 2020-07-11 DIAGNOSIS — R1314 Dysphagia, pharyngoesophageal phase: Secondary | ICD-10-CM | POA: Diagnosis present

## 2020-07-11 DIAGNOSIS — B353 Tinea pedis: Secondary | ICD-10-CM | POA: Diagnosis present

## 2020-07-11 DIAGNOSIS — M5000 Cervical disc disorder with myelopathy, unspecified cervical region: Secondary | ICD-10-CM

## 2020-07-11 DIAGNOSIS — T428X5A Adverse effect of antiparkinsonism drugs and other central muscle-tone depressants, initial encounter: Secondary | ICD-10-CM | POA: Diagnosis not present

## 2020-07-11 DIAGNOSIS — M21372 Foot drop, left foot: Secondary | ICD-10-CM | POA: Diagnosis present

## 2020-07-11 LAB — CBC
HCT: 38.4 % (ref 36.0–46.0)
Hemoglobin: 12.6 g/dL (ref 12.0–15.0)
MCH: 35.1 pg — ABNORMAL HIGH (ref 26.0–34.0)
MCHC: 32.8 g/dL (ref 30.0–36.0)
MCV: 107 fL — ABNORMAL HIGH (ref 80.0–100.0)
Platelets: 331 10*3/uL (ref 150–400)
RBC: 3.59 MIL/uL — ABNORMAL LOW (ref 3.87–5.11)
RDW: 13.1 % (ref 11.5–15.5)
WBC: 6.2 10*3/uL (ref 4.0–10.5)
nRBC: 0 % (ref 0.0–0.2)

## 2020-07-11 IMAGING — DX DG ABDOMEN 1V
2 series · 2 of 2 positions shown · non-contrast
Comparison: No recent.

CLINICAL DATA: Constipation.

EXAM:
ABDOMEN - 1 VIEW

[abdomen kub (1 of 2)]
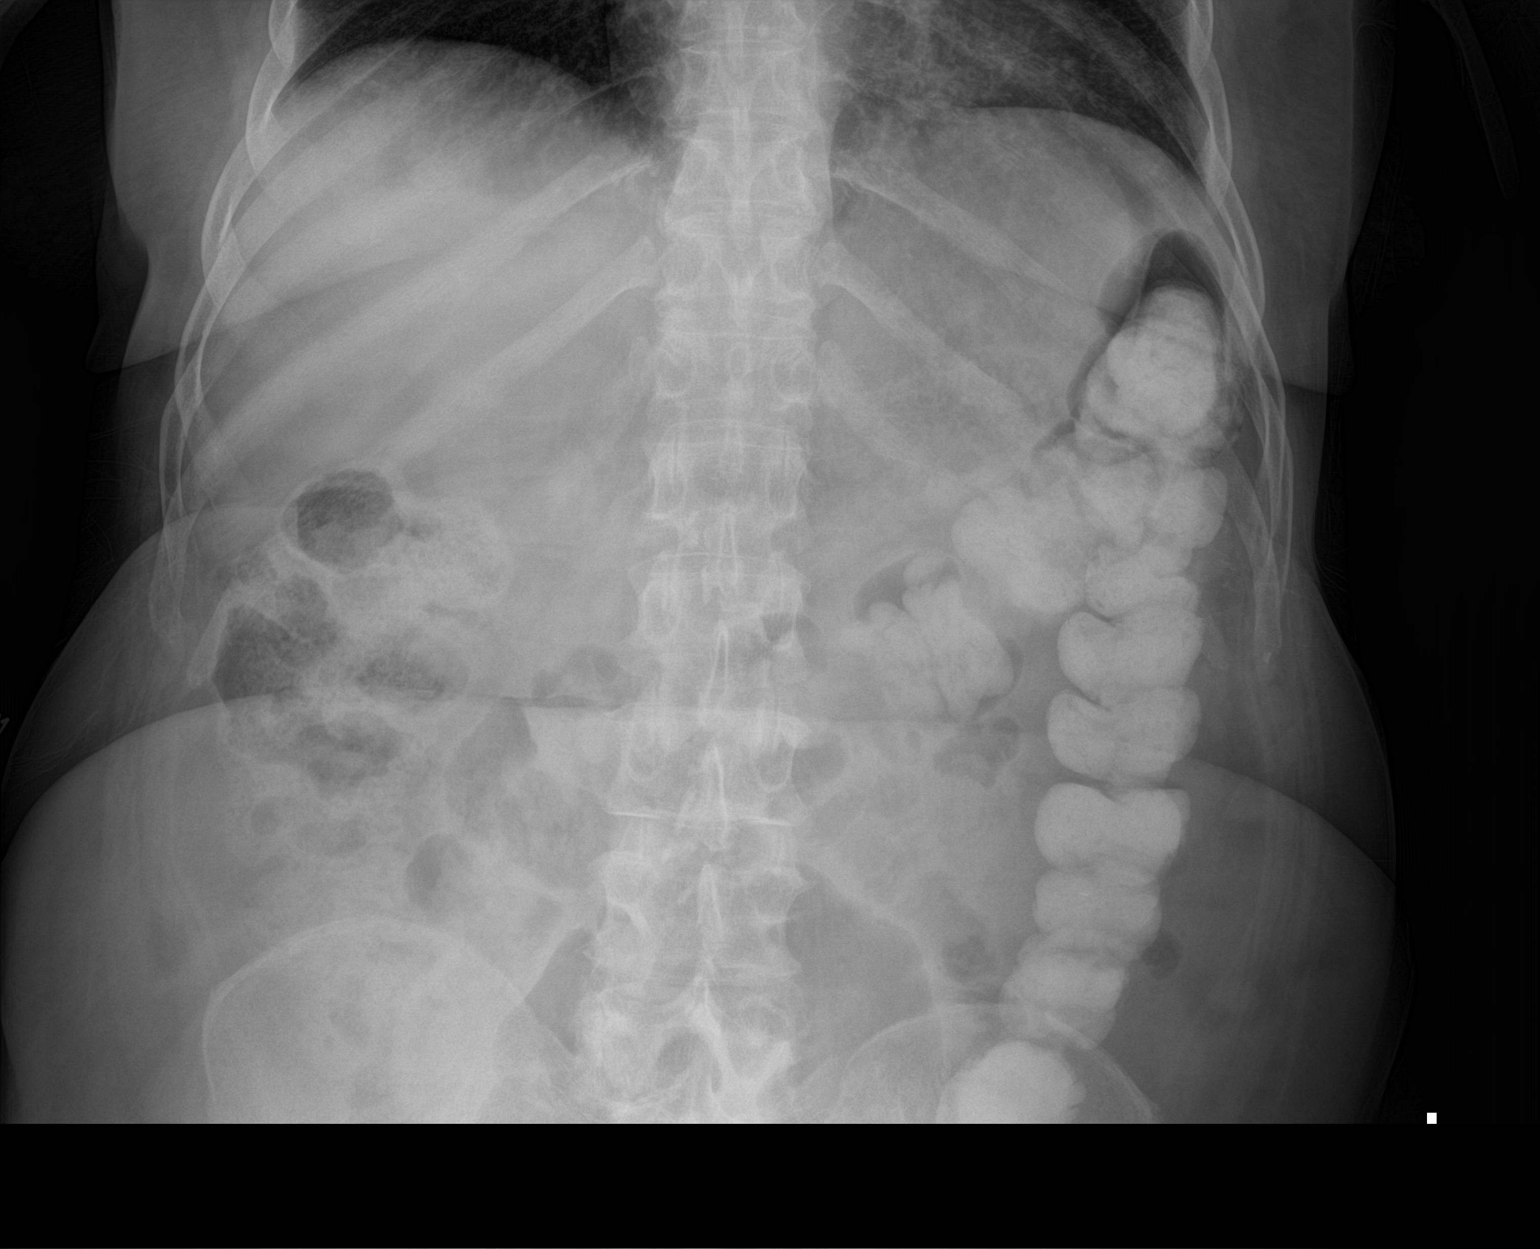

[abdomen kub (2 of 2)]
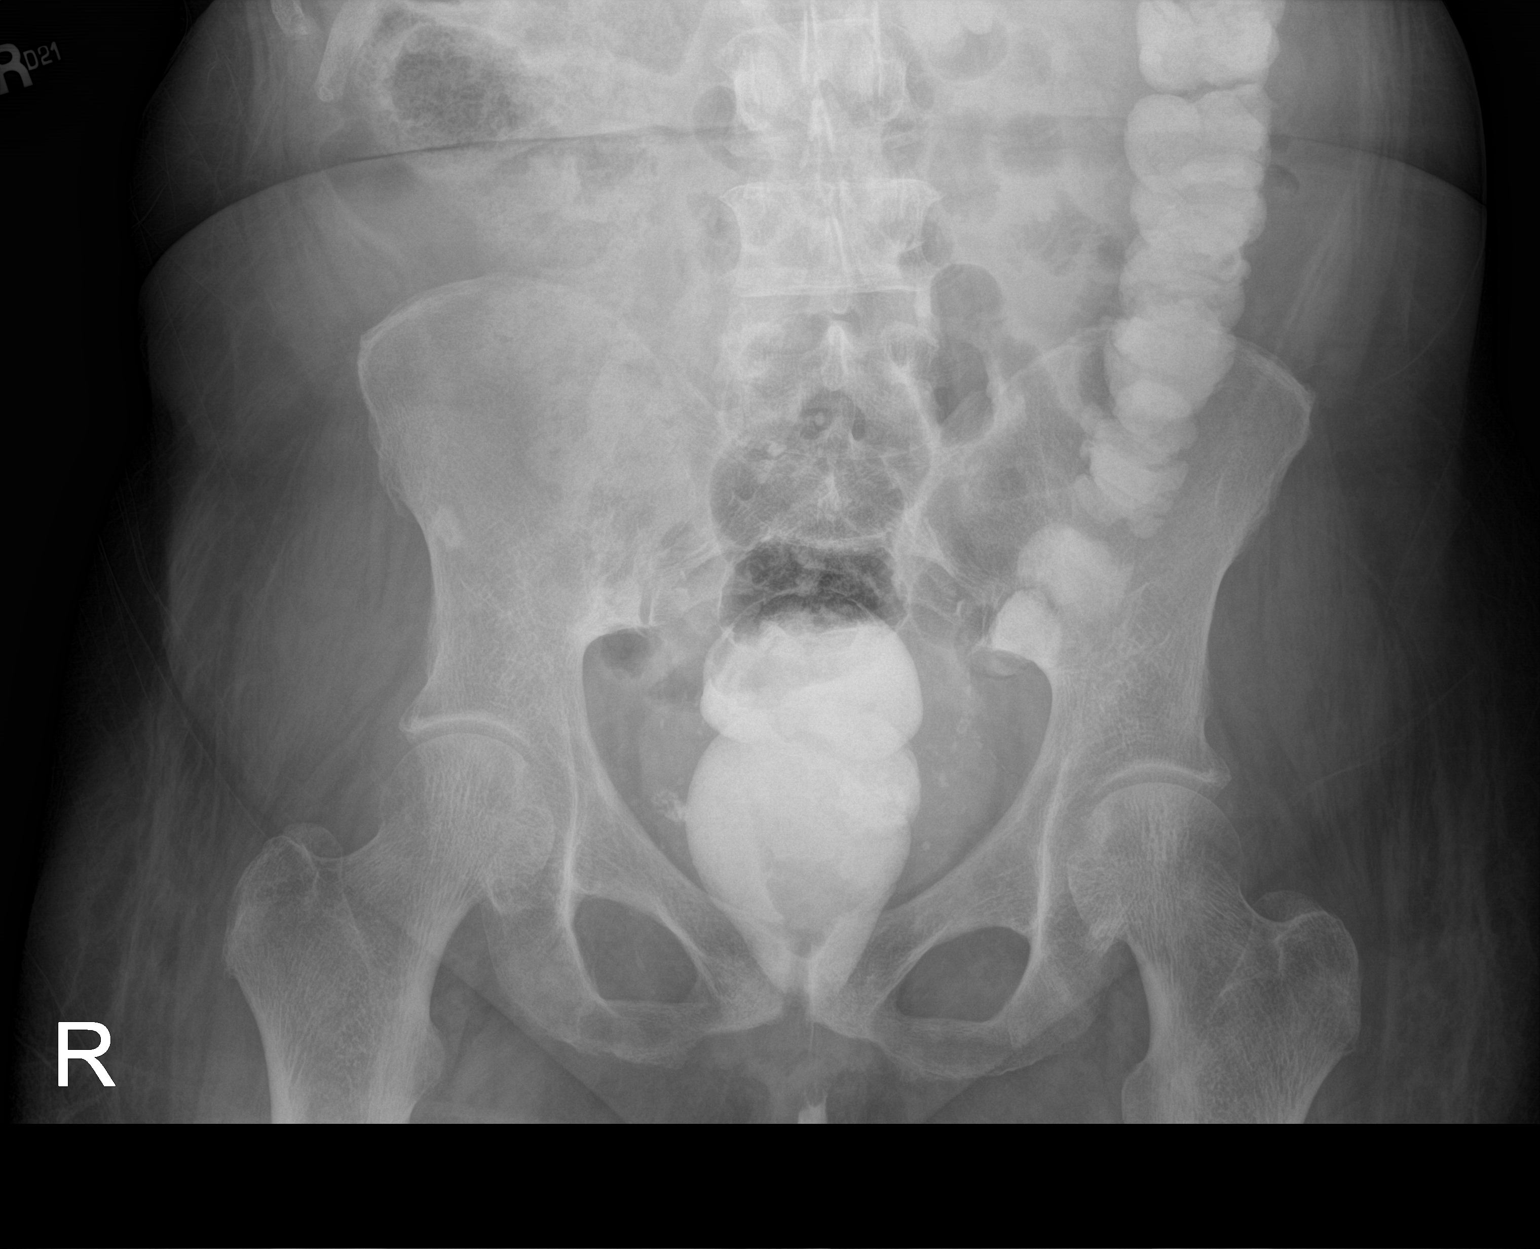

[2 of 2 positions shown; findings below may reference images not displayed]

FINDINGS: Soft tissue structures are unremarkable. Oral contrast noted
throughout the colon and rectum. Moderate stool volume right colon.
No bowel distention. No free air. Degenerative change lumbar spine.
Small well-circumscribed sclerotic density right ilium, most likely
bone island. Pelvic calcifications consistent phleboliths.
IMPRESSION: Oral contrast noted throughout the colon and rectum. Moderate stool
volume right colon. No bowel distention.

## 2020-07-11 MED ORDER — THIAMINE HCL 100 MG PO TABS: 100.0000 mg | ORAL_TABLET | Freq: Every day | ORAL | Status: DC

## 2020-07-11 MED ORDER — BISACODYL 10 MG RE SUPP: 10.0000 mg | Freq: Every day | RECTAL | Status: DC | PRN

## 2020-07-11 MED ORDER — TRAZODONE HCL 50 MG PO TABS
25.0000 mg | ORAL_TABLET | Freq: Every evening | ORAL | Status: DC | PRN
  Administered 2020-08-01 – 2020-08-16 (×3): 50 mg via ORAL
  Administered 2020-08-18: 25 mg via ORAL
  Filled 2020-07-11 (×6): qty 1

## 2020-07-11 MED ORDER — DOCUSATE SODIUM 100 MG PO CAPS: 100.0000 mg | ORAL_CAPSULE | Freq: Two times a day (BID) | ORAL | 0 refills | Status: DC

## 2020-07-11 MED ORDER — ALUM & MAG HYDROXIDE-SIMETH 200-200-20 MG/5ML PO SUSP: 30.0000 mL | Freq: Four times a day (QID) | ORAL | Status: DC | PRN

## 2020-07-11 MED ORDER — NICOTINE 14 MG/24HR TD PT24
14.0000 mg | MEDICATED_PATCH | Freq: Every day | TRANSDERMAL | Status: DC
  Administered 2020-07-12 – 2020-08-14 (×34): 14 mg via TRANSDERMAL
  Filled 2020-07-11 (×36): qty 1

## 2020-07-11 MED ORDER — FAMOTIDINE 20 MG PO TABS
20.0000 mg | ORAL_TABLET | Freq: Every day | ORAL | Status: DC
  Administered 2020-07-11 – 2020-08-20 (×41): 20 mg via ORAL
  Filled 2020-07-11 (×41): qty 1

## 2020-07-11 MED ORDER — NICOTINE 14 MG/24HR TD PT24: 14.0000 mg | MEDICATED_PATCH | Freq: Every day | TRANSDERMAL | 0 refills | Status: DC

## 2020-07-11 MED ORDER — ACETAMINOPHEN 325 MG PO TABS: 650.0000 mg | ORAL_TABLET | Freq: Four times a day (QID) | ORAL | Status: DC | PRN

## 2020-07-11 MED ORDER — PROCHLORPERAZINE EDISYLATE 10 MG/2ML IJ SOLN: 5.0000 mg | Freq: Four times a day (QID) | INTRAMUSCULAR | Status: DC | PRN

## 2020-07-11 MED ORDER — FOLIC ACID 1 MG PO TABS: 1.0000 mg | ORAL_TABLET | Freq: Every day | ORAL | Status: DC

## 2020-07-11 MED ORDER — GUAIFENESIN-DM 100-10 MG/5ML PO SYRP: 5.0000 mL | ORAL_SOLUTION | Freq: Four times a day (QID) | ORAL | Status: DC | PRN

## 2020-07-11 MED ORDER — BACLOFEN 10 MG PO TABS
10.0000 mg | ORAL_TABLET | Freq: Three times a day (TID) | ORAL | Status: DC | PRN
  Administered 2020-07-12: 10 mg via ORAL
  Filled 2020-07-11: qty 1

## 2020-07-11 MED ORDER — PROCHLORPERAZINE MALEATE 5 MG PO TABS
5.0000 mg | ORAL_TABLET | Freq: Four times a day (QID) | ORAL | Status: DC | PRN
  Administered 2020-07-12: 5 mg via ORAL
  Filled 2020-07-11: qty 2

## 2020-07-11 MED ORDER — LIDOCAINE HCL URETHRAL/MUCOSAL 2 % EX GEL: CUTANEOUS | Status: DC | PRN

## 2020-07-11 MED ORDER — ALUM & MAG HYDROXIDE-SIMETH 200-200-20 MG/5ML PO SUSP: 30.0000 mL | Freq: Four times a day (QID) | ORAL | 0 refills | Status: DC | PRN

## 2020-07-11 MED ORDER — ADULT MULTIVITAMIN W/MINERALS CH: 1.0000 | ORAL_TABLET | Freq: Every day | ORAL | Status: DC

## 2020-07-11 MED ORDER — ACETAMINOPHEN 325 MG PO TABS
325.0000 mg | ORAL_TABLET | ORAL | Status: DC | PRN
  Administered 2020-07-12 – 2020-07-25 (×30): 650 mg via ORAL
  Administered 2020-07-26: 325 mg via ORAL
  Administered 2020-07-27 – 2020-08-10 (×19): 650 mg via ORAL
  Administered 2020-08-11: 325 mg via ORAL
  Administered 2020-08-12 – 2020-08-17 (×11): 650 mg via ORAL
  Administered 2020-08-19: 325 mg via ORAL
  Filled 2020-07-11 (×66): qty 2

## 2020-07-11 MED ORDER — PNEUMOCOCCAL VAC POLYVALENT 25 MCG/0.5ML IJ INJ
0.5000 mL | INJECTION | INTRAMUSCULAR | Status: AC
  Administered 2020-07-18: 0.5 mL via INTRAMUSCULAR
  Filled 2020-07-11: qty 0.5

## 2020-07-11 MED ORDER — DIPHENHYDRAMINE HCL 12.5 MG/5ML PO ELIX: 12.5000 mg | ORAL_SOLUTION | Freq: Four times a day (QID) | ORAL | Status: DC | PRN

## 2020-07-11 MED ORDER — FOLIC ACID 1 MG PO TABS
1.0000 mg | ORAL_TABLET | Freq: Every day | ORAL | Status: DC
  Administered 2020-07-12 – 2020-08-21 (×41): 1 mg via ORAL
  Filled 2020-07-11 (×41): qty 1

## 2020-07-11 MED ORDER — THIAMINE HCL 100 MG PO TABS
100.0000 mg | ORAL_TABLET | Freq: Every day | ORAL | Status: DC
Start: 2020-07-12 — End: 2020-08-21
  Administered 2020-07-12 – 2020-08-21 (×41): 100 mg via ORAL
  Filled 2020-07-11 (×41): qty 1

## 2020-07-11 MED ORDER — POLYETHYLENE GLYCOL 3350 17 G PO PACK
17.0000 g | PACK | Freq: Every day | ORAL | Status: DC | PRN
  Administered 2020-08-20: 17 g via ORAL

## 2020-07-11 MED ORDER — ENOXAPARIN SODIUM 60 MG/0.6ML ~~LOC~~ SOLN: 0.5000 mg/kg | SUBCUTANEOUS | Status: DC

## 2020-07-11 MED ORDER — POLYETHYLENE GLYCOL 3350 17 G PO PACK
17.0000 g | PACK | Freq: Two times a day (BID) | ORAL | Status: DC
  Administered 2020-07-11 – 2020-08-21 (×73): 17 g via ORAL
  Filled 2020-07-11 (×76): qty 1

## 2020-07-11 MED ORDER — INFLUENZA VAC SPLIT QUAD 0.5 ML IM SUSY
0.5000 mL | PREFILLED_SYRINGE | INTRAMUSCULAR | Status: AC
  Administered 2020-07-12: 0.5 mL via INTRAMUSCULAR
  Filled 2020-07-11: qty 0.5

## 2020-07-11 MED ORDER — ADULT MULTIVITAMIN W/MINERALS CH
1.0000 | ORAL_TABLET | Freq: Every day | ORAL | Status: DC
  Administered 2020-07-12 – 2020-08-21 (×41): 1 via ORAL
  Filled 2020-07-11 (×41): qty 1

## 2020-07-11 MED ORDER — FLEET ENEMA 7-19 GM/118ML RE ENEM
1.0000 | ENEMA | Freq: Once | RECTAL | Status: DC | PRN
Start: 2020-07-11 — End: 2020-08-21

## 2020-07-11 MED ORDER — PROSOURCE PLUS PO LIQD
30.0000 mL | Freq: Two times a day (BID) | ORAL | Status: DC
  Administered 2020-07-11 – 2020-08-21 (×81): 30 mL via ORAL
  Filled 2020-07-11 (×76): qty 30

## 2020-07-11 MED ORDER — ENOXAPARIN SODIUM 60 MG/0.6ML ~~LOC~~ SOLN
50.0000 mg | SUBCUTANEOUS | Status: DC
  Administered 2020-07-12: 50 mg via SUBCUTANEOUS
  Filled 2020-07-11: qty 0.5

## 2020-07-11 MED ORDER — PROCHLORPERAZINE 25 MG RE SUPP: 12.5000 mg | Freq: Four times a day (QID) | RECTAL | Status: DC | PRN

## 2020-07-11 MED ORDER — FAMOTIDINE 20 MG PO TABS: 20.0000 mg | ORAL_TABLET | Freq: Every day | ORAL | Status: DC

## 2020-07-11 MED ORDER — ENOXAPARIN SODIUM 60 MG/0.6ML ~~LOC~~ SOLN: 52.5000 mg | SUBCUTANEOUS | Status: DC

## 2020-07-11 MED ORDER — ALUM & MAG HYDROXIDE-SIMETH 200-200-20 MG/5ML PO SUSP
30.0000 mL | ORAL | Status: DC | PRN
Start: 2020-07-11 — End: 2020-08-21

## 2020-07-11 NOTE — PMR Pre-admission (Addendum)
PMR Admission Coordinator Pre-Admission Assessment  Patient: Yvette Phillips is an 60 y.o., female MRN: [id number] DOB: 06/25/1960 Height: 5\' 7"  (170.2 cm) Weight: 104.3 kg              Insurance Information  PRIMARY: uninsured       I referred to with insurance to ask for disability and Medicaid assistance on 2/15  Financial Counselor:       Phone#:   The "Data Collection Information Summary" for patients in Inpatient Rehabilitation Facilities with attached "Privacy Act Statement-Health Care Records" was provided and verbally reviewed with: N/A  Emergency Contact Information Contact Information     Name Relation Home Work Mobile   cris,joyce    [phone]   AIZLYNN, DIGILIO [phone]     [phone]    [phone]      Current Medical History  Patient Admitting Diagnosis: central cord syndrome  History of Present Illness: Yvette Phillips is a 60 y.o. female who presented to the hospital on 07/03/20. She fell on Christmas Eve in her apartment and since then experienced severe weakness of both arms and legs. Patient was brought by EMS to [facility] on 12/27 and hospitalized until 1/1 for central cord syndrome. She also had incidental mild COVID-19. Patient was to follow up with outpatient neurosurgery for outpatient surgical intervention.  Upon discharge she had worsening of symptoms. She had another fall and was admitted to [facility] on 07/03/2020. Imaging was negative for an acute fracture of her left knee. MRI brain was negative for acute intracranial abnormality. 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 Seen by SLP with diet recommendations given. Appears primarily esophageal suspected due to postop inflammation.   Past Medical History  History  reviewed. No pertinent past medical history.  Family History  family history is not on file.  Prior Rehab/Hospitalizations:  Has the patient had prior rehab or hospitalizations prior to admission? Yes  Has the patient had major surgery during 100 days prior to admission? Yes  Current Medications   Current Facility-Administered Medications:    acetaminophen (TYLENOL) tablet 650 mg, 650 mg, Oral, Q6H PRN, 650 mg at 07/11/20 0851 **OR** acetaminophen (TYLENOL) suppository 650 mg, 650 mg, Rectal, Q6H PRN, 07/13/20, MD   alum & mag hydroxide-simeth (MAALOX/MYLANTA) 200-200-20 MG/5ML suspension 30 mL, 30 mL, Oral, Q6H PRN, 12-02-2000, NP, 30 mL at 07/07/20 2031   baclofen (LIORESAL) tablet 10 mg, 10 mg, Oral, TID PRN, 2032, MD, 10 mg at 07/06/20 2156   docusate sodium (COLACE) capsule 100 mg, 100 mg, Oral, BID, 2157, MD, 100 mg at 07/10/20 2143   enoxaparin (LOVENOX) injection 52.5 mg, 0.5 mg/kg, Subcutaneous, Q24H, Belue, 2144, RPH, 52.5 mg at 07/11/20 0853   famotidine (PEPCID) tablet 20 mg, 20 mg, Oral, QHS, 07/13/20, NP, 20 mg at 07/10/20 2143   folic acid (FOLVITE) tablet 1 mg, 1 mg, Oral, Daily, 2144, MD, 1 mg at 07/11/20 07/13/20   multivitamin with minerals tablet 1 tablet, 1 tablet, Oral, Daily, 4010, MD, 1 tablet at 07/11/20 0851   nicotine (NICODERM CQ - dosed in mg/24 hours) patch 14 mg, 14 mg, Transdermal, Daily, 07/13/20, MD, 14 mg at 07/11/20 0855   ondansetron (ZOFRAN) tablet 4 mg, 4 mg, Oral, Q6H PRN **OR**  ondansetron (ZOFRAN) injection 4 mg, 4 mg, Intravenous, Q6H PRN, Venetia Night, MD   polyethylene glycol (MIRALAX / GLYCOLAX) packet 17 g, 17 g, Oral, BID, Enedina Finner, MD, 17 g at 07/10/20 2143   thiamine tablet 100 mg, 100 mg, Oral, Daily, 100 mg at 07/11/20 7253 **OR** [DISCONTINUED] thiamine (B-1) injection 100 mg, 100 mg, Intravenous, Daily, Venetia Night, MD, 100 mg at 07/04/20  0935  Patients Current Diet:  Diet Order             DIET DYS 2 Room service appropriate? Yes; Fluid consistency: Thin  Diet effective now                   Precautions / Restrictions Precautions Precautions: Fall,Cervical Precaution Comments: s/p ACDF C4-5 (per secure messaging with MD Myer Haff 07/05/20--no cervical brace needed) Restrictions Weight Bearing Restrictions: No   Has the patient had 2 or more falls or a fall with injury in the past year?Yes  Prior Activity Level Community (5-7x/wk): works 5 days/week  Prior Functional Level Prior Function Level of Independence: Needs assistance Gait / Transfers Assistance Needed: Pt reports that prior to her initial fall in November or DEcember of 2021, that she was walking with no device. States that since that time, she has been struggling and needing walker or hand-held assist from her significant other to stand and pivot. States she has not really walked since before chistmas time. However, does not mention wheelchair use. ADL's / Homemaking Assistance Needed: States she has been needing assist with several ADLs since fall in December/November 2021 from her significant other. 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  Since then pt has been using RW but most recently pt's friend (who lives down street) has been assisting her OOB and to get into chair (hasn't been able to walk with RW).  Self Care: Did the patient need help bathing, dressing, using the toilet or eating?  Independent  Indoor Mobility: Did the patient need assistance with walking from room to room (with or without device)? Independent  Stairs: Did the patient need assistance with internal or external stairs (with or without  device)? Independent  Functional Cognition: Did the patient need help planning regular tasks such as shopping or remembering to take medications? Independent  Home Assistive Devices / Equipment Home Assistive Devices/Equipment: Environmental consultant (specify type) Home Equipment: Walker - 2 wheels,Walker - 4 wheels  Prior Device Use: Indicate devices/aids used by the patient prior to current illness, exacerbation or injury? None of the above  Current Functional Level Cognition  Overall Cognitive Status: Within Functional Limits for tasks assessed Orientation Level: Oriented X4 General Comments: Pt very motivated to participate in therapy.    Extremity Assessment (includes Sensation/Coordination)  Upper Extremity Assessment: RUE deficits/detail,LUE deficits/detail RUE Deficits / Details: Pt with shld flexion to ~1/4 range. Almost absent wrist extension (2/5, gravity decreased plane), grip grossly 3+/5, Elbow flexion grossly 4-/5 RUE Coordination: decreased fine motor,decreased gross motor LUE Deficits / Details: Pt able to perform shoulder flexion to ~1/3 range. Decreased almost absent wrist extension. Pt's elbow flexion MMT grossly 3+/5, grip MMT grossly 3/5 LUE Coordination: decreased fine motor,decreased gross motor  Lower Extremity Assessment: Defer to PT evaluation,Generalized weakness (L appears slightly weaker than R. Overall pt with limited  hip flexion in sitting against gravity and decreased hip/knee flexion for stepping in standing. Pt with some mild knee buckling as well requiring blocking bilaterally.) RLE Deficits / Details: hip flexion 3-/5; knee extension 3/5; knee flexion at least 3/5; DF at least 3/5 AROM (limited DF ROM to grossly 10 degrees short of neutral) RLE Coordination: decreased fine motor,decreased gross motor LLE Deficits / Details: hip flexion 2-/5; knee extension 2-/5; knee flexion 2-/5; DF 2-/5 (limited DF ROM to grossly 20 degrees short of neutral AAROM) LLE Coordination:  decreased fine motor,decreased gross motor    ADLs  Overall ADL's : Needs assistance/impaired Eating/Feeding: Set up,With adaptive utensils,Cueing for safety,Cueing for sequencing Eating/Feeding Details (indicate cue type and reason): Pt provided with built-up handles x2 to support functional independence with self-feeding. She is able to return demonstrate understanding of safe use of built up handle using a spoon to scoop ice cubes x10. Pt able to return demo understanding for how to apply handles to utensils with min cueing for technique for use of her LUE. Pt demos a refined tripod grasp when using built up handle and spoon in her RUE. She is eager to trial at her next meal time. Grooming Details (indicate cue type and reason): Pt educated on safe use of built up handle for grooming tasks as well as self-feeding this date. Functional mobility during ADLs: +2 for physical assistance,Cueing for sequencing,Rolling walker General ADL Comments: Pt requires MIN/MOD A for bed level or seated UB ADLs d/t decreased ROM and FMC to hold objects such as a washcloth. Pt requires MAX to TOTAL A for bed level or seated LB ADLs and cannot tolerate participation in standing self care at this time d/t very poor standing balance requiring 2p assist for transfers, weight shift and to sustain static stand.    Mobility  Overal bed mobility: Needs Assistance Bed Mobility: Sit to Sidelying,Rolling Rolling: Supervision Sidelying to sit: Mod assist Supine to sit: Max assist Sit to supine: Mod assist,Max assist,+2 for physical assistance Sit to sidelying: Mod assist (vc's for technique; assist for B LE's) General bed mobility comments: sit to R sidelying to supine    Transfers  Overall transfer level: Needs assistance Equipment used: Bilateral platform walker Transfers: Sit to/from [employer] Transfers Sit to Stand: Mod assist,Max assist Stand pivot transfers: Min assist,Mod assist (stand step turn recliner  to bed with B platform RW) General transfer comment: x1 trial standing from recliner (mod assist); x2 trials standing from bed (mod assist 1st trial and max assist 2nd trial); vc's for UE/LE placement; assist to initiate and come to full stand; assist to control descent sitting    Ambulation / Gait / Stairs / Wheelchair Mobility  Ambulation/Gait Ambulation/Gait assistance: Min assist,Mod assist,+2 physical assistance,+2 safety/equipment (chair follow) Gait Distance (Feet): 30 Feet Assistive device: Bilateral platform walker Gait Pattern/deviations: Step-to pattern,Staggering left,Staggering right,Trunk flexed,Narrow base of support,Decreased weight shift to left,Decreased step length - left,Decreased step length - right General Gait Details: first 20 feet pt able to advance B LE's (decreased step length and foot clearance L LE compared to R LE); vc's to increase BOS; assist to advance walker; last 10 feet pt requiring min assist to advance L LE with cueing for technique; min to mod assist x2 for ambulation plus chair follow Gait velocity: decreased    Posture / Balance Dynamic Sitting Balance Sitting balance - Comments: pt steady with static sitting but close SBA for any reaching within BOS Balance Overall balance assessment: Needs assistance Sitting-balance support:  No upper extremity supported,Feet supported Sitting balance-Leahy Scale: Fair Sitting balance - Comments: pt steady with static sitting but close SBA for any reaching within BOS Standing balance support: Bilateral upper extremity supported Standing balance-Leahy Scale: Poor Standing balance comment: pt requiring B UE support on B platform walker for standing balance with assist for safety    Special needs/care consideration Stage 2 to buttocks noted 07/04/20   Previous Home Environment  Living Arrangements: Alone  Lives With: Alone Available Help at Discharge: Family,Other (Comment) Type of Home: Apartment Home Layout: One  level Home Access: Stairs to enter Entrance Stairs-Rails: Interior and spatial designeright,Left Entrance Stairs-Number of Steps: 5 Bathroom Shower/Tub: Health visitorWalk-in shower Bathroom Toilet: Standard Bathroom Accessibility: Yes How Accessible: Accessible via walker Home Care Services: No Additional Comments: Pt reports living in studio apt/duplex.  Discharge Living Setting Plans for Discharge Living Setting: Patient's home,Alone,Apartment Type of Home at Discharge: Apartment Discharge Home Layout: One level Discharge Home Access: Stairs to enter Entrance Stairs-Rails: Presenter, broadcastingLeft,Right Entrance Stairs-Number of Steps: 5 Discharge Bathroom Shower/Tub: Walk-in shower Discharge Bathroom Toilet: Standard Discharge Bathroom Accessibility: Yes How Accessible: Accessible via walker Does the patient have any problems obtaining your medications?: Yes (Describe) (uninsured)  Social/Family/Support Systems Patient Roles: Engineer, 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 Pt/Family Agrees to Admission and willing to participate: Yes Program Orientation Provided & Reviewed with Pt/Caregiver Including Roles  & Responsibilities: Yes  Decrease burden of Care through IP rehab admission: n/a  Possible need for SNF placement upon discharge:not anticipated  Patient Condition: This patient's condition remains as documented in the consult dated 07/11/2020, in which the Rehabilitation Physician determined and  documented that the patient's condition is appropriate for intensive rehabilitative care in an inpatient rehabilitation facility. Will admit to inpatient rehab today.  Preadmission Screen Completed By:  Clois DupesBoyette, Barbara Godwin, RN, 07/11/2020 10:52 AM ______________________________________________________________________   Discussed status with Dr. Allena KatzPatel on 07/11/2020 at 1050 and received approval for admission today.  Admission Coordinator:  Clois DupesBoyette, Barbara Godwin, time [phone] Date 07/11/2020

## 2020-07-11 NOTE — Progress Notes (Signed)
Pt discharged to [facility] in-patient rehab bt [facility]. Pt took all belonging. IV taken out of Right hand. Report called to Clydie Braun at [facility].

## 2020-07-11 NOTE — Progress Notes (Signed)
Inpatient Rehabilitation Medication Review by a Pharmacist  A complete drug regimen review was completed for this patient to identify any potential clinically significant medication issues.  Clinically significant medication issues were identified:  no   Type of Medication Issue Identified Description of Issue Urgent (address now) Non-Urgent (address on AM team rounds) Plan   Drug Interaction(s) (clinically significant)       Duplicate Therapy       Allergy       No Medication Administration End Date       Incorrect Dose       Additional Drug Therapy Needed  Per DC summary, continue docusate Non-urgent Patient has several bowel care meds available, ok to hold docusate for now    Other          Time spent performing this drug regimen review (minutes):  10  Alphia Moh, PharmD, BCPS, [facility] Clinical Pharmacist  Please check AMION for all [facility] phone numbers After 10:00 PM, call Main Pharmacy [phone]

## 2020-07-11 NOTE — Progress Notes (Signed)
Patient ID: Yvette Phillips, female   DOB: August 14, 1960, 60 y.o.   MRN: [id number]  Met with the patient to review role of the nurse CM and address educational needs to manage central cord syndrome at C4 level of cervical spinal cord and collaborate with SW to facilitate prep for discharge. Patient with hx of smoking, provided education on smoking risks to health, and smoking cessation. No other questions at present. Continue to follow along to discharge to address questions/educational needs.  Dorthula Nettles, RN3, [place], [place], [place] Office [phone] Cell [phone]

## 2020-07-11 NOTE — Progress Notes (Signed)
Physical Therapy Treatment Patient Details Name: Yvette Phillips MRN: [id number] DOB: 11-24-1960 Today's Date: 07/11/2020    History of Present Illness Pt is a 60 y.o. female presenting to hospital 07/03/20.  Per MD H&P: "Patient explains that on Christmas eve she fell in her apartment and since then she suddenly experienced severe weakness of her both arms and both legs.  At that time, patient eventually brought by EMS to [facility] on 12/27 and was hospitalized until 1/1.  During that hospitalization patient was diagnosed with central cord syndrome as well as an incidental diagnosis of mild COVID-19.  Patient was discharged home on 1/1 with outpatient neurosurgery follow-up for outpatient surgical intervention.  Since that time, patient has experienced progressively worsening weakness of the arms and legs.  Patient explains that she is basically "rolling around" her apartment to get around.  She has been inconsistently receiving her meals from her significant other.  After once again being unable to get up off the ground for several days, patient was once again found down after a well check and was brought into [facility] emergency department for evaluation".  Imaging negative for acute fx L knee.  MRI brain negative for acute intracranial abnormality.  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  Pt s/p ACDF C4-5 07/04/20.  Per chart, no pertinent PMH.    PT Comments    Pt resting in bed upon PT arrival with sister present (but left beginning of session); agreeable to PT session.  Mod assist semi-supine to sitting edge of bed via logrolling; mod assist x1 to stand from bed up to B platform walker; min to mod assist x1 to take steps bed to recliner with B platform RW.  Pt able to ambulate 20 feet and then 10 feet with B platform RW min  to mod assist x1 plus 2nd assist for safety and chair follow.  When pt having increased difficulty taking steps with L LE, pt took a standing break and then able to walk a little further each time.  Pt continues to be very motivated to participate with therapy.  Plan to discharge to CIR today.   Follow Up Recommendations  CIR     Equipment Recommendations  Other (comment) (TBD at next facility)    Recommendations for Other Services OT consult     Precautions / Restrictions Precautions Precautions: Fall;Cervical Precaution Comments: s/p ACDF C4-5 (per secure messaging with MD Myer Haff 07/05/20--no cervical brace needed) Restrictions Weight Bearing Restrictions: No    Mobility  Bed Mobility Overal bed mobility: Needs Assistance Bed Mobility: Rolling;Sidelying to Sit Rolling: Supervision Sidelying to sit: Mod assist       General bed mobility comments: assist for trunk R sidelying to sitting; vc's for technique    Transfers Overall transfer level: Needs assistance Equipment used: Bilateral platform walker Transfers: Sit to/from Stand;Stand Pivot Transfers Sit to Stand:  (mod assist x1 to stand from bed; min to mod assist x2 to stand 1st trial from recliner; mod to max assist x1 to stand 2nd trial from recliner) Stand pivot transfers: Min assist;Mod assist (stand step turn bed to recliner)       General transfer comment: vc's for UE/LE placement; improved ability to scoot towards edge of sitting surface to position for standing (intermittent cueing for technique)  Ambulation/Gait Ambulation/Gait assistance: Min assist;Mod assist;+2 physical assistance Gait Distance (Feet):  (  20 feet; 10 feet) Assistive device: Bilateral platform walker   Gait velocity: decreased   General Gait Details: decreased step length and foot clearance L LE compared to R LE; pt took a couple standing breaks when having difficulty advancing L LE and then able to walk a little further; assist to  advance walker; chair follow; vc's to increased BOS   Stairs             Wheelchair Mobility    Modified Rankin (Stroke Patients Only)       Balance Overall balance assessment: Needs assistance Sitting-balance support: No upper extremity supported;Feet supported Sitting balance-Leahy Scale: Fair Sitting balance - Comments: pt steady with static sitting but close SBA for any reaching within BOS   Standing balance support: Bilateral upper extremity supported Standing balance-Leahy Scale: Poor Standing balance comment: pt requiring B UE support on B platform walker for standing balance with assist for safety                            Cognition Arousal/Alertness: Awake/alert Behavior During Therapy: WFL for tasks assessed/performed Overall Cognitive Status: Within Functional Limits for tasks assessed                                 General Comments: Pt very motivated to participate in therapy.      Exercises      General Comments         Pertinent Vitals/Pain Pain Assessment: No/denies pain Pain Intervention(s): Limited activity within patient's tolerance;Monitored during session;Premedicated before session;Repositioned (pt received pain meds for HA earlier this morning)  Vitals (HR and O2 on room air) stable and WFL throughout treatment session.    Home Living                      Prior Function            PT Goals (current goals can now be found in the care plan section) Acute Rehab PT Goals Patient Stated Goal: Rehab then back home PT Goal Formulation: With patient Time For Goal Achievement: 07/19/20 Potential to Achieve Goals: Good Progress towards PT goals: Progressing toward goals    Frequency    BID      PT Plan Current plan remains appropriate    Co-evaluation              AM-PAC PT "6 Clicks" Mobility   Outcome Measure  Help needed turning from your back to your side while in a flat bed  without using bedrails?: A Little Help needed moving from lying on your back to sitting on the side of a flat bed without using bedrails?: A Lot Help needed moving to and from a bed to a chair (including a wheelchair)?: A Lot Help needed standing up from a chair using your arms (e.g., wheelchair or bedside chair)?: A Lot Help needed to walk in hospital room?: A Lot Help needed climbing 3-5 steps with a railing? : Total 6 Click Score: 12    End of Session Equipment Utilized During Treatment: Gait belt Activity Tolerance: Patient tolerated treatment well Patient left: in chair;with call bell/phone within reach;with chair alarm set;Other (comment) (B heels floating via pillow support) Nurse Communication: Mobility status;Precautions PT Visit Diagnosis: Other abnormalities of gait and mobility (R26.89);Unsteadiness on feet (R26.81);Muscle weakness (generalized) (M62.81);History of falling (Z91.81);Difficulty in walking, not elsewhere classified (  R26.2)     Time: 2956-2130 PT Time Calculation (min) (ACUTE ONLY): 38 min  Charges:  $Gait Training: 23-37 mins $Therapeutic Activity: 8-22 mins                    Hendricks Limes, PT 07/11/20, 12:05 PM

## 2020-07-11 NOTE — Discharge Summary (Signed)
Yvette Phillips VCB:[id number] DOB: Oct 29, 1960 DOA: 07/03/2020  PCP: Patient, No Pcp Per  Admit date: 07/03/2020 Discharge date: 07/11/2020  Admitted From: home Disposition:  CIR  Recommendations for Outpatient Follow-up:  1. Follow up with PCP in 1 week 2. Please obtain BMP/CBC in one week 3. Neurosurgery Dr. Whitney Post in 2 weeks      Discharge Condition:Stable CODE STATUS: Full Diet recommendation: Dysphagia 2 diet  Brief/Interim Summary: Per HPI: Past medical history of nicotine dependence, polysubstance abuse, alcohol abuse who presents to [facility] via EMS after being found down in her apartment.  Patient explained that on Christmas eve she fell in her apartment and since then she suddenly experienced severe weakness of her both arms and both legs.  At that time, patient eventually brought by EMS to [facility] on 12/27 and was hospitalized until 1/1.  During that hospitalization patient was diagnosed with central cord syndrome as well as an incidental diagnosis of mild COVID-19.  Patient was discharged home on 1/1 with outpatient neurosurgery follow-up for outpatient surgical intervention.  Since that time, patient had experienced progressively worsening weakness of the arms and legs.  Patient explains that she is basically "rolling around" her apartment to get around.  She had been inconsistently receiving her meals from her significant other.  After once again being unable to get up off the ground for several days, patient was once again found down after a well check and was brought into [facility] emergency department for evaluation.Upon evaluation at [facility] emergency department, case was discussed with Dr. Adriana Simas with neurosurgery considering recent diagnosis of central cord syndrome. 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    Large disc herniation at C4-5, Central cord syndrome  with bilateral upper and lower extremity weakness:  --07/04/20-- status post anterior cervical decompression/discectomy by Dr Marcell Barlow -- physical therapy and Dr Myer Haff Recommends CIR/acute rehab ---- patient working daily with physical therapy showing some improvement. She will benefit from acute inpatient rehab.  Going to CIR today  Dysphagia -- seen by speech therapy. Appears primarily esophageal suspected due to postop inflammation. --  barium swallow as per recommendation--Normal esophageal motility Currently on dysphagia 2 diet.  Will need to slowly reevaluate swallowing to advance diet  Hypokalemia and hypomagnesemia: Repleted table  Polysubstance abuse (cocaine, cannabinoids, alcohol, tobacco): She has been counseled to quit.  Was placed on Ativan for withdrawal Currently stable  Elevated blood pressure without diagnosis of hypertension Currently stable will monitor blood pressure.  If starts having issues with elevated blood pressure may consider starting her on amlodipine low-dose advance as tolerated.    Discharge Diagnoses:  Principal Problem:   Central cord syndrome [facility]) Active Problems:   Alcohol abuse   Polysubstance abuse (HCC)   Nicotine dependence, cigarettes, uncomplicated   Hypokalemia   Hypomagnesemia   Pressure injury of skin   Dysphagia    Discharge Instructions  Discharge Instructions    Diet - low sodium heart healthy   Complete by: As directed    Increase activity slowly   Complete by: As directed    No wound care   Complete by: As directed      Allergies as of 07/11/2020      Reactions   Codeine Anaphylaxis   Penicillins Anaphylaxis      Medication List    TAKE these medications   acetaminophen 325 MG tablet Commonly known as: TYLENOL Take 2 tablets (650 mg  total) by mouth every 6 (six) hours as needed for mild pain (or Fever >/= 101).   alum & mag hydroxide-simeth 200-200-20 MG/5ML suspension Commonly known as:  MAALOX/MYLANTA Take 30 mLs by mouth every 6 (six) hours as needed for indigestion or heartburn.   docusate sodium 100 MG capsule Commonly known as: COLACE Take 1 capsule (100 mg total) by mouth 2 (two) times daily.   enoxaparin 60 MG/0.6ML injection Commonly known as: LOVENOX Inject 0.525 mLs (52.5 mg total) into the skin daily. Start taking on: July 12, 2020   famotidine 20 MG tablet Commonly known as: PEPCID Take 1 tablet (20 mg total) by mouth at bedtime.   folic acid 1 MG tablet Commonly known as: FOLVITE Take 1 tablet (1 mg total) by mouth daily. Start taking on: July 12, 2020   multivitamin with minerals Tabs tablet Take 1 tablet by mouth daily. Start taking on: July 12, 2020   nicotine 14 mg/24hr patch Commonly known as: NICODERM CQ - dosed in mg/24 hours Place 1 patch (14 mg total) onto the skin daily. Start taking on: July 12, 2020   thiamine 100 MG tablet Take 1 tablet (100 mg total) by mouth daily. Start taking on: July 12, 2020       Follow-up Information    Lucy Chris, MD Follow up in 2 week(s).   Specialty: Neurosurgery Why: needs f/u xray Contact information: [address] [place] [address] [phone]              Allergies  Allergen Reactions  . Codeine Anaphylaxis  . Penicillins Anaphylaxis    Consultations:  Neurosurgery   Procedures/Studies: DG Cervical Spine 2 or 3 views  Result Date: 07/05/2020 CLINICAL DATA:  60 year old female status post cervical fusion. EXAM: CERVICAL SPINE - 2-3 VIEW COMPARISON:  Cervical spine radiograph dated 07/04/2020. FINDINGS: C4-C5 disc spacer and anterior fusion. There is no acute fracture or subluxation. The visualized posterior elements and odontoid appear intact. There is anatomic alignment of the lateral masses of C1 and C2. Anterior paraspinal soft tissue swelling with pockets of air, postsurgical. IMPRESSION: Status post C4-C5 disc spacer and anterior fusion. No  acute fracture or subluxation. Electronically Signed   By: Elgie Collard M.D.   On: 07/05/2020 19:34   DG Cervical Spine 2-3 Views  Result Date: 07/04/2020 CLINICAL DATA:  Surgery, elective. Provided fluoroscopy time: 3 seconds. EXAM: CERVICAL SPINE - 2-3 VIEW; DG C-ARM 1-60 MIN COMPARISON:  Cervical spine MRI 07/03/2020. FINDINGS: PA and lateral view intraoperative fluoroscopic images of the cervical spine are submitted, 3 images total. On the final images, ACDF hardware is present at the C4-C5 level. No unexpected finding. Partially visualized ET tube. A surgical sponge/packing material projects ventral to the ACDF hardware on the image taken at 5:21 p.m., but this is no longer present at time of the PA fluoroscopic image taken at 5:22 p.m. IMPRESSION: Three intraoperative fluoroscopic images of the cervical spine from C4-C5 ACDF, as described. Electronically Signed   By: Jackey Loge DO   On: 07/04/2020 17:54   DG Knee 2 Views Left  Result Date: 07/03/2020 CLINICAL DATA:  Left knee pain EXAM: LEFT KNEE - 1-2 VIEW COMPARISON:  05/31/2020 FINDINGS: Frontal and cross-table lateral views of the left knee are obtained. No acute fracture, subluxation, or dislocation. Stable 3 compartmental osteoarthritis greatest in the medial compartment. No joint effusion. Soft tissues are unremarkable. IMPRESSION: 1. Stable osteoarthritis.  No acute fracture. Electronically Signed   By: Sharlet Salina  M.D.   On: 07/03/2020 18:23   MR BRAIN WO CONTRAST  Result Date: 07/03/2020 CLINICAL DATA:  Found down EXAM: MRI HEAD WITHOUT CONTRAST TECHNIQUE: Multiplanar, multiecho pulse sequences of the brain and surrounding structures were obtained without intravenous contrast. COMPARISON:  None. FINDINGS: Brain: No acute infarct, mass effect or extra-axial collection. No acute or chronic hemorrhage. 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 The midline structures are normal. Vascular: Major flow voids are preserved.  Skull and upper cervical spine: Normal calvarium and skull base. Visualized upper cervical spine and soft tissues are normal. Sinuses/Orbits:No paranasal sinus fluid levels or advanced mucosal thickening. No mastoid or middle ear effusion. Normal orbits. IMPRESSION: 1. No acute intracranial abnormality. 2. 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 Electronically Signed   By: Deatra Robinson M.D.   On: 07/03/2020 21:51   MR Cervical Spine Wo Contrast  Result Date: 07/03/2020 CLINICAL DATA:  Found down.  Frequent falls. EXAM: MRI CERVICAL SPINE WITHOUT CONTRAST TECHNIQUE: Multiplanar, multisequence MR imaging of the cervical spine was performed. No intravenous contrast was administered. COMPARISON:  None. FINDINGS: Alignment: Physiologic.  Straightening of normal cervical lordosis. Vertebrae: Modic type 2 endplate signal changes at C4-5. No acute fracture. Cord: Hyperintense T2-weighted signal at the C4-5 levels within the spinal cord. Posterior Fossa, vertebral arteries, paraspinal tissues: Negative. Disc levels: C1-2: Unremarkable. C2-3: Normal disc space and facet joints. There is no spinal canal stenosis. No neural foraminal stenosis. C3-4: Normal disc space and facet joints. There is no spinal canal stenosis. No neural foraminal stenosis. C4-5: Large central disc extrusion with components of superior and inferior migration. There is mass effect on the spinal cord with associated signal change. Severe spinal canal stenosis. Severe right and mild left neural foraminal stenosis. C5-6: Disc desiccation with mild uncovertebral hypertrophy. There is no spinal canal stenosis. No neural foraminal stenosis. C6-7: Normal disc space and facet joints. There is no spinal canal stenosis. No neural foraminal stenosis. C7-T1: Normal disc space and facet joints. There is no spinal canal stenosis. No neural foraminal stenosis. IMPRESSION: 1. 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 2. Severe right C4-5 neural foraminal stenosis. Electronically Signed   By: Deatra Robinson M.D.   On: 07/03/2020 22:02   DG Chest Portable 1 View  Result Date: 07/03/2020 CLINICAL DATA:  Weakness EXAM: PORTABLE CHEST 1 VIEW COMPARISON:  None. FINDINGS: The heart size and mediastinal contours are within normal limits. Both lungs are clear. The visualized skeletal structures are unremarkable. IMPRESSION: No acute abnormality of the lungs in AP portable projection. Electronically Signed   By: Lauralyn Primes M.D.   On: 07/03/2020 14:39   DG C-Arm 1-60 Min  Result Date: 07/04/2020 CLINICAL DATA:  Surgery, elective. Provided fluoroscopy time: 3 seconds. EXAM: DG C-ARM 1-60 MIN COMPARISON:  Cervical spine MRI 07/03/2020. FINDINGS: PA and lateral view intraoperative fluoroscopic images of the cervical spine are submitted, 3 images total. On the final images, ACDF hardware is present at the C4-C5 level. No unexpected finding. Partially visualized ET tube. A surgical sponge/packing material projects ventral to the ACDF hardware on the image taken at 5:21 p.m., but this is no longer present at time of the PA fluoroscopic image taken at 5:22 p.m. IMPRESSION: Three intraoperative fluoroscopic images of the cervical spine from C4-C5 ACDF, as described. Electronically Signed   By: Jackey Loge DO   On: 07/04/2020 18:17   DG  C-Arm 1-60 Min  Result Date: 07/04/2020 CLINICAL DATA:  Surgery, elective. Provided fluoroscopy time: 3 seconds. EXAM: CERVICAL SPINE - 2-3 VIEW; DG C-ARM 1-60 MIN COMPARISON:  Cervical spine MRI 07/03/2020. FINDINGS: PA and lateral view intraoperative fluoroscopic images of the cervical spine are submitted, 3 images total. On the final images, ACDF hardware is present at the C4-C5 level. No unexpected finding. Partially visualized ET tube. A surgical sponge/packing material projects ventral to the ACDF hardware on the image  taken at 5:21 p.m., but this is no longer present at time of the PA fluoroscopic image taken at 5:22 p.m. IMPRESSION: Three intraoperative fluoroscopic images of the cervical spine from C4-C5 ACDF, as described. Electronically Signed   By: Jackey Loge DO   On: 07/04/2020 17:54   DG ESOPHAGUS W SINGLE CM (SOL OR THIN BA)  Result Date: 07/06/2020 CLINICAL DATA:  Cervical fusion 2 days ago.  Painful swallowing. EXAM: ESOPHAGUS/BARIUM SWALLOW/TABLET STUDY TECHNIQUE: Initial scout AP supine abdominal image obtained to insure adequate colon cleansing. Barium was introduced into the colon in a retrograde fashion and refluxed from the rectum to the cecum. Spot images of the colon followed by overhead radiographs were obtained. FLUOROSCOPY TIME:  Fluoroscopy Time:  0.7 minute Radiation Exposure Index (if provided by the fluoroscopic device): 6.8 mGy Number of Acquired Spot Images: 0 COMPARISON:  None. FINDINGS: Normal pharyngeal anatomy and motility. Contrast flowed freely through the esophagus without evidence of a stricture or mass. Normal esophageal mucosa without evidence of irregularity or ulceration. Esophageal motility was normal. No evidence of reflux. No definite hiatal hernia was demonstrated. IMPRESSION: No esophageal stricture, mass or ulceration. Normal esophageal motility. Electronically Signed   By: Elige Ko   On: 07/06/2020 13:07       Subjective: Has no complaints this AM.  Discharge Exam: Vitals:   07/11/20 0407 07/11/20 0850  BP: 121/65 125/69  Pulse: 97 91  Resp: 16 16  Temp: 98 F (36.7 C) 98.4 F (36.9 C)  SpO2: 97% 98%   Vitals:   07/10/20 1947 07/10/20 2338 07/11/20 0407 07/11/20 0850  BP: 116/74 119/80 121/65 125/69  Pulse: 97 92 97 91  Resp: Temp: 98.1 F (36.7 C) 98.3 F (36.8 C) 98 F (36.7 C) 98.4 F (36.9 C)  TempSrc: Oral Oral Oral Oral  SpO2: 99% 95% 97% 98%  Weight:      Height:        General: Pt is alert, awake, not in acute  distress Cardiovascular: RRR, S1/S2 +, no rubs, no gallops Respiratory: CTA bilaterally, no wheezing, no rhonchi Abdominal: Soft, NT, ND, bowel sounds + Extremities: no edema, no cyanosis    The results of significant diagnostics from this hospitalization (including imaging, microbiology, ancillary and laboratory) are listed below for reference.     Microbiology: Recent Results (from the past 240 hour(s))  SARS CORONAVIRUS 2 (TAT 6-24 HRS) Nasopharyngeal Nasopharyngeal Swab     Status: None   Collection Time: 07/03/20  6:17 PM   Specimen: Nasopharyngeal Swab  Result Value Ref Range Status   SARS Coronavirus 2 NEGATIVE NEGATIVE Final    Comment: (NOTE) SARS-CoV-2 target nucleic acids are NOT DETECTED.  The SARS-CoV-2 RNA is generally detectable in upper and lower respiratory specimens during the acute phase of infection. Negative results do not preclude SARS-CoV-2 infection, do not rule out co-infections with other pathogens, and should not be used as the sole basis for treatment or other patient management decisions. Negative results must  be combined with clinical observations, patient history, and epidemiological information. The expected result is Negative.  Fact Sheet for Patients: HairSlick.nohttps://www.fda.gov/media/138098/download  Fact Sheet for Healthcare Providers: quierodirigir.comhttps://www.fda.gov/media/138095/download  This test is not yet approved or cleared by the [place] FDA and  has been authorized for detection and/or diagnosis of SARS-CoV-2 by FDA under an Emergency Use Authorization (EUA). This EUA will remain  in effect (meaning this test can be used) for the duration of the COVID-19 declaration under Se ction 564(b)(1) of the Act, 21 U.S.C. section 360bbb-3(b)(1), unless the authorization is terminated or revoked sooner.  Performed at [facility], [address] [place], [place] [address]      Labs: BNP (last 3 results) No results for input(s): BNP in the  last 8760 hours. Basic Metabolic Panel: Recent Labs  Lab 07/07/20 0504 07/09/20 0456  NA 137 136  K 4.2 4.0  CL 99 99  CO2 27 27  GLUCOSE 89 92  BUN 9 10  CREATININE 0.75 0.78  CALCIUM 9.3 9.4  MG 1.9  --    Liver Function Tests: No results for input(s): AST, ALT, ALKPHOS, BILITOT, PROT, ALBUMIN in the last 168 hours. No results for input(s): LIPASE, AMYLASE in the last 168 hours. No results for input(s): AMMONIA in the last 168 hours. CBC: Recent Labs  Lab 07/11/20 0451  WBC 6.2  HGB 12.6  HCT 38.4  MCV 107.0*  PLT 331   Cardiac Enzymes: No results for input(s): CKTOTAL, CKMB, CKMBINDEX, TROPONINI in the last 168 hours. BNP: Invalid input(s): POCBNP CBG: No results for input(s): GLUCAP in the last 168 hours. D-Dimer No results for input(s): DDIMER in the last 72 hours. Hgb A1c No results for input(s): HGBA1C in the last 72 hours. Lipid Profile No results for input(s): CHOL, HDL, LDLCALC, TRIG, CHOLHDL, LDLDIRECT in the last 72 hours. Thyroid function studies No results for input(s): TSH, T4TOTAL, T3FREE, THYROIDAB in the last 72 hours.  Invalid input(s): FREET3 Anemia work up No results for input(s): VITAMINB12, FOLATE, FERRITIN, TIBC, IRON, RETICCTPCT in the last 72 hours. Urinalysis    Component Value Date/Time   COLORURINE AMBER (A) 07/03/2020 1649   APPEARANCEUR HAZY (A) 07/03/2020 1649   LABSPEC 1.028 07/03/2020 1649   PHURINE 5.0 07/03/2020 1649   GLUCOSEU NEGATIVE 07/03/2020 1649   HGBUR MODERATE (A) 07/03/2020 1649   BILIRUBINUR SMALL (A) 07/03/2020 1649   KETONESUR 80 (A) 07/03/2020 1649   PROTEINUR NEGATIVE 07/03/2020 1649   NITRITE NEGATIVE 07/03/2020 1649   LEUKOCYTESUR MODERATE (A) 07/03/2020 1649   Sepsis Labs Invalid input(s): PROCALCITONIN,  WBC,  LACTICIDVEN Microbiology Recent Results (from the past 240 hour(s))  SARS CORONAVIRUS 2 (TAT 6-24 HRS) Nasopharyngeal Nasopharyngeal Swab     Status: None   Collection Time: 07/03/20  6:17  PM   Specimen: Nasopharyngeal Swab  Result Value Ref Range Status   SARS Coronavirus 2 NEGATIVE NEGATIVE Final    Comment: (NOTE) SARS-CoV-2 target nucleic acids are NOT DETECTED.  The SARS-CoV-2 RNA is generally detectable in upper and lower respiratory specimens during the acute phase of infection. Negative results do not preclude SARS-CoV-2 infection, do not rule out co-infections with other pathogens, and should not be used as the sole basis for treatment or other patient management decisions. Negative results must be combined with clinical observations, patient history, and epidemiological information. The expected result is Negative.  Fact Sheet for Patients: HairSlick.nohttps://www.fda.gov/media/138098/download  Fact Sheet for Healthcare Providers: quierodirigir.comhttps://www.fda.gov/media/138095/download  This test is not yet approved or  cleared by the [place] and  has been authorized for detection and/or diagnosis of SARS-CoV-2 by FDA under an Emergency Use Authorization (EUA). This EUA will remain  in effect (meaning this test can be used) for the duration of the COVID-19 declaration under Se ction 564(b)(1) of the Act, 21 U.S.C. section 360bbb-3(b)(1), unless the authorization is terminated or revoked sooner.  Performed at [facility], [address], [place], [place] [address]      Time coordinating discharge: Over 30 minutes  SIGNED:   Lynn Ito, MD  [facility] 07/11/2020, 10:59 AM Pager   If 7PM-7AM, please contact night-coverage [email or web address] Password [email or web address]

## 2020-07-11 NOTE — Progress Notes (Signed)
Pt arrived via EMS, pt alert

## 2020-07-11 NOTE — Progress Notes (Signed)
Horton Chin, MD  Physician  Physical Medicine and Rehabilitation  Consult Note      Signed  Date of Service:  07/10/2020  1:44 PM      Related encounter: ED to Hosp-Admission (Discharged) from 07/03/2020 in [facility] (1A)       Signed      Expand All Collapse All     Show:Clear all [x] Manual[x] Template[] Copied  Added by: [x] Raulkar, , MD   [] Hover for details           Physical Medicine and Rehabilitation Consult Reason for Consult: Central cord syndrome Referring Physician: , MD     HPI: Yvette Phillips is a 60 y.o. female who presented to the hospital on 07/03/20. She fell on Christmas Eve in her apartment and since then experienced severe weakness of both arms and legs. Patient was brought by EMS to [facility] on 12/27 and hospitalized until 1/1 for central cord syndrome. She also had incidental mild COVID-19. Upon discharge she had worsening of symptoms. She had another fall and was admitted to [facility]. Imaging was negative for an acute fracture of her left knee. MRI brain was negative for acute intracranial abnormality. 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 Physical Medicine & Rehabilitation was consulted to assess candidacy for CIR given diffuse weakness impairing mobility and ADLs.      Review of Systems  Constitutional: Negative.   HENT: Negative.   Eyes: Negative.   Respiratory: Negative.   Cardiovascular: Negative.   Gastrointestinal: Negative.   Genitourinary: Negative.   Musculoskeletal: Negative.   Skin: Negative.   Neurological: Positive for focal weakness. Negative for sensory change.  Endo/Heme/Allergies: Negative.   Psychiatric/Behavioral: Negative.     History reviewed.  No pertinent past medical history.      Past Surgical History:  Procedure Laterality Date  . ANTERIOR CERVICAL DECOMP/DISCECTOMY FUSION N/A 07/04/2020    Procedure: ANTERIOR CERVICAL DECOMPRESSION/DISCECTOMY FUSION 1 LEVEL;  Surgeon: [facility], MD;  Location: [facility] ORS;  Service: Neurosurgery;  Laterality: N/A;         Family History  Problem Relation Age of Onset  . Heart disease Neg Hx      Social History:  reports that she has been smoking. She has never used smokeless tobacco. She reports current alcohol use. She reports previous drug use. Allergies:      Allergies  Allergen Reactions  . Codeine Anaphylaxis  . Penicillins Anaphylaxis    No medications prior to admission.      Home: Home Living Family/patient expects to be discharged to:: Private residence Living Arrangements: Alone Available Help at Discharge: Family,Other (Comment) (sister, boyfriend) Type of Home: Apartment Home Access: Stairs to enter Entrance Stairs-Number of Steps: 5 Entrance Stairs-Rails: Right,Left Home Layout: One level Bathroom Shower/Tub: 09/01/2020: Standard Bathroom Accessibility: Yes Home Equipment: Walker - 2 wheels,Walker - 4 wheels Additional Comments: Pt reports living in studio apt/duplex.  Functional History: Prior Function Level of Independence: Needs assistance Gait / Transfers Assistance Needed: Pt reports that prior to her initial fall in November or DEcember of 2021, that she was walking with no device. States that since that time, she has been struggling and needing walker or hand-held assist from her significant other to stand and pivot. States she has not  really walked since before chistmas time. However, does not mention wheelchair use. ADL's / Homemaking Assistance Needed: States she has been needing assist with several ADLs since fall in December/November 2021 from her significant other. 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  Since then pt has been using RW but most recently pt's friend (who lives down street) has been assisting her OOB and to get into chair (hasn't been able to walk with RW). Functional Status:  Mobility: Bed Mobility Overal bed mobility: Needs Assistance Bed Mobility: Rolling,Sidelying to Sit Rolling: Supervision (rolling R and L for clean-up d/t bowel incontinence; use of bed rail) Sidelying to sit: Mod assist Supine to sit: Max assist Sit to supine: Mod assist,Max assist,+2 for physical assistance Sit to sidelying: +2 for safety/equipment,Max assist General bed mobility comments: assist for trunk R sidelying to sitting Transfers Overall transfer level: Needs assistance Equipment used: Bilateral platform walker Transfers: Sit to/from Stand,Stand Pivot Transfers Sit to Stand: Mod assist Stand pivot transfers: Min assist,Mod assist (stand step turn bed to recliner with B platform RW) General transfer comment: x1 trial standing from bed and x1 trial standing from recliner; vc's for UE/LE placement; assist to initiate and come to full stand; assist to control descent sitting Ambulation/Gait Ambulation/Gait assistance: Min assist,Mod assist,+2 physical assistance,+2 safety/equipment (chair follow) Gait Distance (Feet): 30 Feet Assistive device: Bilateral platform walker Gait Pattern/deviations: Step-to pattern,Staggering left,Staggering right,Trunk flexed,Narrow base of support,Decreased weight shift to left,Decreased step length - left,Decreased step length - right General Gait Details: first 20 feet pt able to advance B LE's (decreased step length and foot clearance L LE compared to R LE); vc's to increase BOS; assist to advance walker; last 10 feet pt requiring min assist to  advance L LE with cueing for technique; min to mod assist x2 for ambulation plus chair follow Gait velocity: decreased   ADL: ADL Overall ADL's : Needs assistance/impaired Eating/Feeding: Set up,With adaptive utensils,Cueing for safety,Cueing for sequencing Eating/Feeding Details (indicate cue type and reason): Pt provided with built-up handles x2 to support functional independence with self-feeding. She is able to return demonstrate understanding of safe use of built up handle using a spoon to scoop ice cubes x10. Pt able to return demo understanding for how to apply handles to utensils with min cueing for technique for use of her LUE. Pt demos a refined tripod grasp when using built up handle and spoon in her RUE. She is eager to trial at her next meal time. Grooming Details (indicate cue type and reason): Pt educated on safe use of built up handle for grooming tasks as well as self-feeding this date. Functional mobility during ADLs: +2 for physical assistance,Cueing for sequencing,Rolling walker General ADL Comments: Pt requires MIN/MOD A for bed level or seated UB ADLs d/t decreased ROM and FMC to hold objects such as a washcloth. Pt requires MAX to TOTAL A for bed level or seated LB ADLs and cannot tolerate participation in standing self care at this time d/t very poor standing balance requiring 2p assist for transfers, weight shift and to sustain static stand.   Cognition: Cognition Overall Cognitive Status: Within Functional Limits for tasks assessed Orientation Level: Oriented X4 Cognition Arousal/Alertness: Awake/alert Behavior During Therapy: WFL for tasks assessed/performed Overall Cognitive Status: Within  Functional Limits for tasks assessed General Comments: Pt very motivated to participate in therapy.   Blood pressure 121/66, pulse 89, temperature 98.6 F (37 C), resp. rate 17, height 5\' 7"  (1.702 m), weight 104.3 kg, SpO2 99 %. Physical Exam  Gen: no distress, normal  appearing HEENT: oral mucosa pink and moist, NCAT, poor dentition Cardio: Reg rate Chest: normal effort, normal rate of breathing Abd: soft, non-distended Ext: no edema Psych: pleasant, normal affect Skin: incision C/D/I Neuro: Alert and oriented x3 Musculoskeletal: RUE: 5/5 throughout LUE: 4/5 EF, EE, 3/5 WE and hand grip RLE: 5/5 throughout LLE: 4/5 throughout   Lab Results Last 24 Hours  No results found for this or any previous visit (from the past 24 hour(s)).   Imaging Results (Last 48 hours)  No results found.       Assessment/Plan: Diagnosis: Cervical myelopathy s/p ACDF C4-C5 on 07/04/20 1. Does the need for close, 24 hr/day medical supervision in concert with the patient's rehab needs make it unreasonable for this patient to be served in a less intensive setting? Yes 2. Co-Morbidities requiring supervision/potential complications:  1. Alcohol abuse: will require counseling closer to discharge 2. Polysubstance abuse 3. Nicotine dependence 4. Pressure injury of skin: will require daily monitoring, wound care, and repositioning 5. Electrolyte abnormalities: will require monitoring of K+ and Mg+ 3. Due to bladder management, bowel management, safety, skin/wound care, disease management, medication administration, pain management and patient education, does the patient require 24 hr/day rehab nursing? Yes 4. Does the patient require coordinated care of a physician, rehab nurse, therapy disciplines of PT, OT, SLP to address physical and functional deficits in the context of the above medical diagnosis(es)? Yes Addressing deficits in the following areas: balance, endurance, locomotion, strength, transferring, bowel/bladder control, bathing, dressing, feeding, grooming, toileting, cognition, swallowing and psychosocial support 5. Can the patient actively participate in an intensive therapy program of at least 3 hrs of therapy per day at least 5 days per week? Yes 6. The potential  for patient to make measurable gains while on inpatient rehab is excellent 7. Anticipated functional outcomes upon discharge from inpatient rehab are modified independent  with PT, modified independent with OT, modified independent with SLP. 8. Estimated rehab length of stay to reach the above functional goals is: 10-12 days 9. Anticipated discharge destination: Home 10. Overall Rehab/Functional Prognosis: excellent   RECOMMENDATIONS: This patient's condition is appropriate for continued rehabilitative care in the following setting: CIR Patient has agreed to participate in recommended program. Yes Note that insurance prior authorization may be required for reimbursement for recommended care.   Comment: Thank you for this consult. Admission coordinator to follow.    I have personally performed a face to face diagnostic evaluation, including, but not limited to relevant history and physical exam findings, of this patient and developed relevant assessment and plan.  Additionally, I have reviewed and concur with the physician assistant's documentation above.   09/01/20, MD               Routing History              Note Details  Author Sula Soda, Carlis Abbott, MD File Time 07/10/2020  2:44 PM  Author Type Physician Status Signed  Last Editor 07/12/2020, MD Service Physical Medicine and Rehabilitation  [facility] Admit Date 07/11/2020

## 2020-07-11 NOTE — Progress Notes (Signed)
Inpatient Rehabilitation Admissions Coordinator  CIR/Inpatient acute rehab bed at [facility] is available to admit patient to today. I spoke with patient by phone and she is in agreement. I have alerted acute team and TOC. I will contact [facility] for pickup. She will be admitted to [facility] [address] with Dr. Maryla Morrow receiving MD to CIR.   Ottie Glazier, RN, MSN Rehab Admissions Coordinator [phone] 07/11/2020 11:21 AM

## 2020-07-11 NOTE — Progress Notes (Signed)
Marcello Fennel, MD  Physician  Physical Medicine and Rehabilitation  PMR Pre-admission      Addendum  Date of Service:  07/11/2020 10:38 AM      Related encounter: ED to Hosp-Admission (Discharged) from 07/03/2020 in [facility] (1A)           Show:Clear all [x] Manual[x] Template[x] Copied  Added by: [x] Darly Fails, , RN[x] , MD   [] Hover for details  PMR Admission Coordinator Pre-Admission Assessment   Patient: Yvette Phillips is an 60 y.o., female MRN: DOB: [id number] Height: 5\' 7"  (170.2 cm) Weight: 104.3 kg                                                                                                                                                  Insurance Information   PRIMARY: uninsured        I referred to 75 with insurance to ask for disability and Medicaid assistance on 2/15   Financial Counselor:       Phone#:    The "Data Collection Information Summary" for patients in Inpatient Rehabilitation Facilities with attached "Privacy Act Statement-Health Care Records" was provided and verbally reviewed with: N/A   Emergency Contact Information Contact Information       Name Relation Home Work Mobile    cris,joyce       [phone]    MADISYN, MAWHINNEY [phone]        3/15       [phone]         Current Medical History  Patient Admitting Diagnosis: central cord syndrome   History of Present Illness: Yvette Phillips is a 60 y.o. female who presented to the hospital on 07/03/20. She fell on Christmas Eve in her apartment and since then experienced severe weakness of both arms and legs. Patient was brought by EMS to [facility] on 12/27 and hospitalized until 1/1 for central cord syndrome. She also had incidental mild COVID-19. Patient was to follow up with outpatient neurosurgery for outpatient surgical intervention.  Upon discharge she had worsening of symptoms. She had  another fall and was admitted to [facility] on 07/03/2020. Imaging was negative for an acute fracture of her left knee. MRI brain was negative for acute intracranial abnormality. 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 Seen by SLP with diet recommendations given. Appears primarily esophageal suspected due to postop inflammation.    Past Medical History  History reviewed. No pertinent past medical history.   Family History  family history is not on file.   Prior Rehab/Hospitalizations:  Has the patient had prior rehab or hospitalizations prior to admission? Yes  Has the patient had major surgery during 100 days prior to admission? Yes   Current Medications    Current Facility-Administered Medications:  .  acetaminophen (TYLENOL) tablet 650 mg, 650 mg, Oral, Q6H PRN, 650 mg at 07/11/20 0851 **OR** acetaminophen (TYLENOL) suppository 650 mg, 650 mg, Rectal, Q6H PRN, Venetia Night, MD .  alum & mag hydroxide-simeth (MAALOX/MYLANTA) 200-200-20 MG/5ML suspension 30 mL, 30 mL, Oral, Q6H PRN, Manuela Schwartz, NP, 30 mL at 07/07/20 2031 .  baclofen (LIORESAL) tablet 10 mg, 10 mg, Oral, TID PRN, Venetia Night, MD, 10 mg at 07/06/20 2156 .  docusate sodium (COLACE) capsule 100 mg, 100 mg, Oral, BID, Enedina Finner, MD, 100 mg at 07/10/20 2143 .  enoxaparin (LOVENOX) injection 52.5 mg, 0.5 mg/kg, Subcutaneous, Q24H, Otelia Sergeant, RPH, 52.5 mg at 07/11/20 0853 .  famotidine (PEPCID) tablet 20 mg, 20 mg, Oral, QHS, Manuela Schwartz, NP, 20 mg at 07/10/20 2143 .  folic acid (FOLVITE) tablet 1 mg, 1 mg, Oral, Daily, Venetia Night, MD, 1 mg at 07/11/20 0851 .  multivitamin with minerals tablet 1 tablet, 1 tablet, Oral, Daily, Venetia Night, MD, 1 tablet at 07/11/20  0851 .  nicotine (NICODERM CQ - dosed in mg/24 hours) patch 14 mg, 14 mg, Transdermal, Daily, Venetia Night, MD, 14 mg at 07/11/20 0855 .  ondansetron (ZOFRAN) tablet 4 mg, 4 mg, Oral, Q6H PRN **OR** ondansetron (ZOFRAN) injection 4 mg, 4 mg, Intravenous, Q6H PRN, Venetia Night, MD .  polyethylene glycol (MIRALAX / GLYCOLAX) packet 17 g, 17 g, Oral, BID, Enedina Finner, MD, 17 g at 07/10/20 2143 .  thiamine tablet 100 mg, 100 mg, Oral, Daily, 100 mg at 07/11/20 0539 **OR** [DISCONTINUED] thiamine (B-1) injection 100 mg, 100 mg, Intravenous, Daily, Venetia Night, MD, 100 mg at 07/04/20 0935   Patients Current Diet:  Diet Order                  DIET DYS 2 Room service appropriate? Yes; Fluid consistency: Thin  Diet effective now                         Precautions / Restrictions Precautions Precautions: Fall,Cervical Precaution Comments: s/p ACDF C4-5 (per secure messaging with MD Myer Haff 07/05/20--no cervical brace needed) Restrictions Weight Bearing Restrictions: No    Has the patient had 2 or more falls or a fall with injury in the past year?Yes   Prior Activity Level Community (5-7x/wk): works 5 days/week   Prior Functional Level Prior Function Level of Independence: Needs assistance Gait / Transfers Assistance Needed: Pt reports that prior to her initial fall in November or DEcember of 2021, that she was walking with no device. States that since that time, she has been struggling and needing walker or hand-held assist from her significant other to stand and pivot. States she has not really walked since before chistmas time. However, does not mention wheelchair use. ADL's / Homemaking Assistance Needed: States she has been needing assist with several ADLs since fall in December/November 2021 from her significant other. 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  Since then pt has been using RW but most recently pt's friend (who lives down street) has been assisting her OOB and to get into  chair (hasn't been able to walk with RW).   Self Care: Did the patient need help bathing, dressing, using the toilet or eating?  Independent   Indoor Mobility: Did the patient need assistance with walking from room to room (with or without device)? Independent   Stairs: Did the patient need assistance with internal or external stairs (with or without device)? Independent   Functional Cognition: Did the patient need help planning regular tasks such as shopping or remembering to take medications? Independent   Home Assistive Devices / Equipment Home Assistive Devices/Equipment: Environmental consultantWalker (specify type) Home Equipment: Walker - 2 wheels,Walker - 4 wheels   Prior Device Use: Indicate devices/aids used by the patient prior to current illness, exacerbation or injury? None of the above   Current Functional Level Cognition   Overall Cognitive Status: Within Functional Limits for tasks assessed Orientation Level: Oriented X4 General Comments: Pt very motivated to participate in therapy.    Extremity Assessment (includes Sensation/Coordination)   Upper Extremity Assessment: RUE deficits/detail,LUE deficits/detail RUE Deficits / Details: Pt with shld flexion to ~1/4 range. Almost absent wrist extension (2/5, gravity decreased plane), grip grossly 3+/5, Elbow flexion grossly 4-/5 RUE Coordination: decreased fine motor,decreased gross motor LUE Deficits / Details: Pt able to perform shoulder flexion to ~1/3 range. Decreased almost absent wrist extension. Pt's elbow flexion MMT grossly 3+/5, grip MMT grossly 3/5 LUE Coordination: decreased fine motor,decreased gross motor  Lower Extremity Assessment: Defer to PT evaluation,Generalized  weakness (L appears slightly weaker than R. Overall pt with limited hip flexion in sitting against gravity and decreased hip/knee flexion for stepping in standing. Pt with some mild knee buckling as well requiring blocking bilaterally.) RLE Deficits / Details: hip flexion 3-/5; knee extension 3/5; knee flexion at least 3/5; DF at least 3/5 AROM (limited DF ROM to grossly 10 degrees short of neutral) RLE Coordination: decreased fine motor,decreased gross motor LLE Deficits / Details: hip flexion 2-/5; knee extension 2-/5; knee flexion 2-/5; DF 2-/5 (limited DF ROM to grossly 20 degrees short of neutral AAROM) LLE Coordination: decreased fine motor,decreased gross motor     ADLs   Overall ADL's : Needs assistance/impaired Eating/Feeding: Set up,With adaptive utensils,Cueing for safety,Cueing for sequencing Eating/Feeding Details (indicate cue type and reason): Pt provided with built-up handles x2 to support functional independence with self-feeding. She is able to return demonstrate understanding of safe use of built up handle using a spoon to scoop ice cubes x10. Pt able to return demo understanding for how to apply handles to utensils with min cueing for technique for use of her LUE. Pt demos a refined tripod grasp when using built up handle and spoon in her RUE. She is eager to trial at her next meal time. Grooming Details (indicate cue type and reason): Pt educated on safe use of built up handle for grooming tasks as well as self-feeding this date. Functional mobility during ADLs: +2 for physical assistance,Cueing for sequencing,Rolling walker General ADL Comments: Pt requires MIN/MOD A for bed level or seated UB ADLs d/t decreased ROM and FMC to hold objects such as a washcloth. Pt requires MAX to TOTAL A for bed level or seated LB ADLs and cannot tolerate participation in standing self care at this time d/t very poor standing balance requiring 2p assist for transfers, weight shift and to sustain  static stand.     Mobility   Overal bed mobility: Needs Assistance Bed Mobility: Sit to Sidelying,Rolling Rolling: Supervision Sidelying to sit: Mod assist Supine to sit: Max  assist Sit to supine: Mod assist,Max assist,+2 for physical assistance Sit to sidelying: Mod assist (vc's for technique; assist for B LE's) General bed mobility comments: sit to R sidelying to supine     Transfers   Overall transfer level: Needs assistance Equipment used: Bilateral platform walker Transfers: Sit to/from [employer] Transfers Sit to Stand: Mod assist,Max assist Stand pivot transfers: Min assist,Mod assist (stand step turn recliner to bed with B platform RW) General transfer comment: x1 trial standing from recliner (mod assist); x2 trials standing from bed (mod assist 1st trial and max assist 2nd trial); vc's for UE/LE placement; assist to initiate and come to full stand; assist to control descent sitting     Ambulation / Gait / Stairs / Wheelchair Mobility   Ambulation/Gait Ambulation/Gait assistance: Min assist,Mod assist,+2 physical assistance,+2 safety/equipment (chair follow) Gait Distance (Feet): 30 Feet Assistive device: Bilateral platform walker Gait Pattern/deviations: Step-to pattern,Staggering left,Staggering right,Trunk flexed,Narrow base of support,Decreased weight shift to left,Decreased step length - left,Decreased step length - right General Gait Details: first 20 feet pt able to advance B LE's (decreased step length and foot clearance L LE compared to R LE); vc's to increase BOS; assist to advance walker; last 10 feet pt requiring min assist to advance L LE with cueing for technique; min to mod assist x2 for ambulation plus chair follow Gait velocity: decreased     Posture / Balance Dynamic Sitting Balance Sitting balance - Comments: pt steady with static sitting but close SBA for any reaching within BOS Balance Overall balance assessment: Needs assistance Sitting-balance  support: No upper extremity supported,Feet supported Sitting balance-Leahy Scale: Fair Sitting balance - Comments: pt steady with static sitting but close SBA for any reaching within BOS Standing balance support: Bilateral upper extremity supported Standing balance-Leahy Scale: Poor Standing balance comment: pt requiring B UE support on B platform walker for standing balance with assist for safety     Special needs/care consideration Stage 2 to buttocks noted 07/04/20    Previous Home Environment  Living Arrangements: Alone  Lives With: Alone Available Help at Discharge: Family,Other (Comment) Type of Home: Apartment Home Layout: One level Home Access: Stairs to enter Entrance Stairs-Rails: Interior and spatial designer of Steps: 5 Bathroom Shower/Tub: Health visitor: Standard Bathroom Accessibility: Yes How Accessible: Accessible via walker Home Care Services: No Additional Comments: Pt reports living in studio apt/duplex.   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 Pt/Family Agrees to Admission and willing to participate: Yes Program Orientation Provided & Reviewed with Pt/Caregiver Including Roles  & Responsibilities: Yes   Decrease burden  of Care through IP rehab admission: n/a   Possible need for SNF placement upon discharge:not anticipated   Patient Condition: This patient's condition remains as documented in the consult dated 07/11/2020, in which the Rehabilitation Physician determined and documented that the patient's condition is appropriate for intensive rehabilitative care in an inpatient rehabilitation facility. Will admit to inpatient rehab today.   Preadmission Screen Completed By:  Clois Dupes, RN, 07/11/2020 10:52 AM ______________________________________________________________________   Discussed status with Dr. Allena Katz on 07/11/2020 at 1050 and received approval for admission today.   Admission Coordinator:  Clois Dupes, time 0981 Date 07/11/2020           Revision History                             Note Details  Author Marcello Fennel, MD File Time 07/11/2020 11:00 AM  Author Type Physician Status Addendum  Last Editor Marcello Fennel, MD Service Physical Medicine and Rehabilitation  [facility] Acct # [id number] Admit Date 07/11/2020

## 2020-07-11 NOTE — Progress Notes (Signed)
   Progress Note   Date: 07/11/2020  Postop day 7 from C4-5 ACDF  Subjective: Yvette Phillips is doing well after her anterior cervical decompression and fusion. She has been taking a regular diet. She is not complaining of any pain around incision but she does endorse that it itches. She does feel like her strength may be improving.   Vital Signs: Temp:  [98 F (36.7 C)-98.6 F (37 C)] 98 F (36.7 C) (02/16 0407) Pulse Rate:  [89-97] 97 (02/16 0407) Resp:  [16-17] 16 (02/16 0407) BP: (116-128)/(65-80) 121/65 (02/16 0407) SpO2:  [95 %-99 %] 97 % (02/16 0407) Temp (24hrs), Avg:98.2 F (36.8 C), Min:98 F (36.7 C), Max:98.6 F (37 C)  Weight: 104.3 kg   Problem List Patient Active Problem List   Diagnosis Date Noted  . Dysphagia   . Pressure injury of skin 07/05/2020  . Nicotine dependence, cigarettes, uncomplicated 07/04/2020  . Hypokalemia 07/04/2020  . Hypomagnesemia 07/04/2020  . Polysubstance abuse (HCC) 07/03/2020  . Central cord syndrome (HCC) 05/21/2020  . Alcohol abuse 05/21/2020    Medications: Scheduled Meds: . docusate sodium  100 mg Oral BID  . enoxaparin (LOVENOX) injection  0.5 mg/kg Subcutaneous Q24H  . famotidine  20 mg Oral QHS  . folic acid  1 mg Oral Daily  . multivitamin with minerals  1 tablet Oral Daily  . nicotine  14 mg Transdermal Daily  . polyethylene glycol  17 g Oral BID  . thiamine  100 mg Oral Daily    Labs:  Lab Results  Component Value Date   WBC 6.2 07/11/2020   WBC 7.5 07/04/2020   HCT 38.4 07/11/2020   HCT 39.7 07/04/2020   PLT 331 07/11/2020   PLT 263 07/04/2020    Lab Results  Component Value Date   INR 1.0 07/04/2020   Lab Results  Component Value Date   NA 136 07/09/2020   NA 137 07/07/2020   K 4.0 07/09/2020   K 4.2 07/07/2020   BUN 10 07/09/2020   BUN 9 07/07/2020    Lab Results  Component Value Date   MG 1.9 07/07/2020   Imaging: Cervical spine x-rays: There is a stable placement of the C4-5 interbody  graft and anterior plating.  Physical exam: Neck is soft, flat Incision well approximated  Strength in grip is 3 out of 5, remainder of the upper extremities is 4 to 4+ out of 5 Strength in the lower extremities appears 4+ out of 5 throughout  Decrease sensation in bilateral upper extremities  Assessment/plan -postop day 7 from C4-5 ACDF -Postoperative x-rays completed -Patient tolerating diet -Continue to work with PT/OT -Molli Knock for DVT prophylaxis -Patient is clear from neurosurgery perspective for discharge when able, we will follow-up in clinic for x-rays 2 to 3 weeks after discharge  Lucy Chris, MD

## 2020-07-11 NOTE — H&P (Addendum)
Physical Medicine and Rehabilitation Admission H&P    Chief Complaint  Patient presents with  . Functional deficits due to central cord syndrome.    HPI: Yvette Phillips is a 60 year old female with history of fall around christmas with progressive weakness and admitted [facility] 12/27- 05/26/20 with work up revealing C4/C5 disc herniation with severe spinal stenosis and cord compression, central cord syndrome, hypokalemia as well as mild Covid 19 infection.  History taken from chart review and patient.  She was set for outpatient neurosurgery follow up but continued to have BUE/BLE weakness, sustained a fall the evening before.  Her boyfriend put her to bed and called EMS the next day.  She was found after a well check and admitted to [facility] on 07/03/2020.  Urine drug screen positive cocaine and THC.  She was found to have electrolyte abnormality with potassium 3.4, Mg 1.5 and CK-375.  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 MRI C spine showed large central disc protrusion at C4-5 with severe spinal stenosis and compressive myelopathy as well as severe right C4/C5 foraminal stenosis. Right knee contusion noted and X rays negative for fracture.    Neurosurgery evaluated patient with exam revealing sensory deficits bilateral hands below forearm and bilateral lower extremities as well as 1/5 strength bilateral grip/wrist extension and 4/5 left biceps/triceps/iliopsoas/quads. MAPs. >80s recommended and she was taken to OR on 07/04/2020 for ACDF of C4/C5  by  Dr.  Myer Haff.  Hospital course further complicated by resulting post op diffuse weakness a s well as difficulty with swallowing.  Barium swallow done 02/11 due to difficulty swallowing and was negative for mas, stricture or ulceration. She was evaluated by ST and started on dysphagia 2, thins recommended with assistance for feeding. Therapy ongoing and patient limited by BLE/BUE weakness with difficulty standing and staggering gait, is  unable to feed herself or perform ADLs. CIR recommended due to functional decline.  Please see preadmission assessment from earlier today as well.  Review of Systems  Constitutional: Negative for chills and fever.  HENT: Negative for hearing loss.   Eyes: Negative for blurred vision and double vision.  Respiratory: Negative for cough and shortness of breath.   Cardiovascular: Negative for chest pain and palpitations.  Gastrointestinal: Positive for constipation (finally had BM yesterday--after 3 weeks). Negative for heartburn and nausea.  Genitourinary: Negative for dysuria.  Musculoskeletal: Positive for joint pain (left knee) and myalgias.  Skin: Negative for itching and rash.  Neurological: Positive for tingling, sensory change, focal weakness and weakness (LLE>RLE weakness. ).       Spasms LLE>RLE.   Psychiatric/Behavioral: Negative for depression. The patient is not nervous/anxious.   All other systems reviewed and are negative.    Past medical history: None   Past Surgical History:  Procedure Laterality Date  . ANTERIOR CERVICAL DECOMP/DISCECTOMY FUSION N/A 07/04/2020   Procedure: ANTERIOR CERVICAL DECOMPRESSION/DISCECTOMY FUSION 1 LEVEL;  Surgeon: Venetia Night, MD;  Location: [facility] ORS;  Service: Neurosurgery;  Laterality: N/A;    Family History  Problem Relation Age of Onset  . Diabetes Mother   . Diabetes Father   . Heart disease Neg Hx     Social History:  Lives alone. Was working prior to Christmas and has been essentially non-ambulatory since fall in Dec. Her boygriend has been assisting to get OOB to chair daily. She could stand with walker initially.   She reports that she has been smoking--1/2 PPD. She has never used smokeless tobacco.  She reports current alcohol us--mixed drink and beer after work. She smokes cocaine twice a day and uses marijuana once a day--no IV drug use.    Allergies  Allergen Reactions  . Codeine Anaphylaxis  . Penicillins Anaphylaxis    Medications Prior to Admission  Medication Sig Dispense Refill  . acetaminophen (TYLENOL) 325 MG tablet Take 2 tablets (650 mg total) by mouth every 6 (six) hours as needed for mild pain (or Fever >/= 101).    Marland Kitchen alum & mag hydroxide-simeth (MAALOX/MYLANTA) 200-200-20 MG/5ML suspension Take 30 mLs by mouth every 6 (six) hours as needed for indigestion or heartburn. 355 mL 0  . docusate sodium (COLACE) 100 MG capsule Take 1 capsule (100 mg total) by mouth 2 (two) times daily. 10 capsule 0  . [START ON 07/12/2020] enoxaparin (LOVENOX) 60 MG/0.6ML injection Inject 0.525 mLs (52.5 mg total) into the skin daily. 0 mL   . famotidine (PEPCID) 20 MG tablet Take 1 tablet (20 mg total) by mouth at bedtime.    Melene Muller ON 07/12/2020] folic acid (FOLVITE) 1 MG tablet Take 1 tablet (1 mg total) by mouth daily.    Melene Muller ON 07/12/2020] Multiple Vitamin (MULTIVITAMIN WITH MINERALS) TABS tablet Take 1 tablet by mouth daily.    Melene Muller ON 07/12/2020] nicotine (NICODERM CQ - DOSED IN MG/24 HOURS) 14 mg/24hr patch Place 1 patch (14 mg total) onto the skin daily. 28 patch 0  . [START ON 07/12/2020] thiamine 100 MG tablet Take 1 tablet (100 mg total) by mouth daily.      Drug Regimen Review  Drug regimen was reviewed and remains appropriate with no significant issues identified  Home: Home Living Family/patient expects to be discharged to:: Private residence Living Arrangements: Alone Available Help at Discharge: Family,Other (Comment) Type of Home: Apartment Home Access: Stairs to enter Entrance Stairs-Number of Steps: 5 Entrance Stairs-Rails: Right,Left Home Layout: One level Bathroom Shower/Tub: Health visitor: Standard Bathroom Accessibility: Yes Home Equipment: Walker - 2 wheels,Walker - 4 wheels Additional Comments: Pt reports living in studio apt/duplex.  Lives With: Alone   Functional History: Prior Function Level of Independence: Needs assistance Gait / Transfers Assistance  Needed: Pt reports that prior to her initial fall in November or DEcember of 2021, that she was walking with no device. States that since that time, she has been struggling and needing walker or hand-held assist from her significant other to stand and pivot. States she has not really walked since before chistmas time. However, does not mention wheelchair use. ADL's / Homemaking Assistance Needed: States she has been needing assist with several ADLs since fall in December/November 2021 from her significant other. 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  Since then pt has been using RW but most recently pt's friend (who lives down street) has been assisting her OOB and to get into chair (hasn't been able to walk with RW).  Functional Status:  Mobility: Bed Mobility Overal bed mobility: Needs Assistance Bed Mobility: Rolling,Sidelying to Sit Rolling: Supervision Sidelying to sit: Mod assist Supine to sit: Max assist Sit to supine: Mod assist,Max assist,+2 for physical assistance Sit to sidelying: Mod assist (vc's for technique; assist for B LE's) General bed mobility comments: assist for trunk R sidelying to sitting;  vc's for technique Transfers Overall transfer level: Needs assistance Equipment used: Bilateral platform walker Transfers: Sit to/from [employer] Transfers Sit to Stand:  (mod assist x1 to stand from bed; min to mod assist x2 to stand 1st trial from recliner; mod to max assist x1 to stand 2nd trial from recliner) Stand pivot transfers: Min assist,Mod assist (stand step turn bed to recliner) General transfer comment: vc's for UE/LE placement; improved ability to scoot towards edge of sitting surface to position for standing (intermittent  cueing for technique) Ambulation/Gait Ambulation/Gait assistance: Min assist,Mod assist,+2 physical assistance Gait Distance (Feet):  (20 feet; 10 feet) Assistive device: Bilateral platform walker Gait Pattern/deviations: Step-to pattern,Staggering left,Staggering right,Trunk flexed,Narrow base of support,Decreased weight shift to left,Decreased step length - left,Decreased step length - right General Gait Details: decreased step length and foot clearance L LE compared to R LE; pt took a couple standing breaks when having difficulty advancing L LE and then able to walk a little further; assist to advance walker; chair follow; vc's to increased BOS Gait velocity: decreased    ADL: ADL Overall ADL's : Needs assistance/impaired Eating/Feeding: Set up,With adaptive utensils,Cueing for safety,Cueing for sequencing Eating/Feeding Details (indicate cue type and reason): Pt provided with built-up handles x2 to support functional independence with self-feeding. She is able to return demonstrate understanding of safe use of built up handle using a spoon to scoop ice cubes x10. Pt able to return demo understanding for how to apply handles to utensils with min cueing for technique for use of her LUE. Pt demos a refined tripod grasp when using built up handle and spoon in her RUE. She is eager to trial at her next meal time. Grooming Details (indicate cue type and reason): Pt educated on safe use of built up handle for grooming tasks as well as self-feeding this date. Functional mobility during ADLs: +2 for physical assistance,Cueing for sequencing,Rolling walker General ADL Comments: Pt requires MIN/MOD A for bed level or seated UB ADLs d/t decreased ROM and FMC to hold objects such as a washcloth. Pt requires MAX to TOTAL A for bed level or seated LB ADLs and cannot tolerate participation in standing self care at this time d/t very poor standing balance requiring 2p assist for transfers, weight shift and to  sustain static stand.  Cognition: Cognition Overall Cognitive Status: Within Functional Limits for tasks assessed Orientation Level: Oriented X4 Cognition Arousal/Alertness: Awake/alert Behavior During Therapy: WFL for tasks assessed/performed Overall Cognitive Status: Within Functional Limits for tasks assessed General Comments: Pt very motivated to participate in therapy.   Blood pressure (!) 155/95, pulse 88, temperature 98.3 F (36.8 C), resp. rate 17, height 5\' 7"  (1.702 m), weight 104.3 kg, SpO2 100 %. Physical Exam Vitals and nursing note reviewed.  Constitutional:      General: She is not in acute distress.    Appearance: Normal appearance.  HENT:     Head: Normocephalic and atraumatic.     Right Ear: External ear normal.     Left Ear: External ear normal.     Nose: Nose normal.  Eyes:     General:        Right eye: No discharge.        Left eye: No discharge.     Extraocular Movements: Extraocular movements intact.  Cardiovascular:     Rate and Rhythm: Normal rate and regular rhythm.  Pulmonary:     Effort: Pulmonary effort is normal. No respiratory distress.     Breath sounds: No  stridor.  Abdominal:     General: There is distension.     Comments: Hypoactive bowel sounds  Musculoskeletal:     Cervical back: Normal range of motion and neck supple.     Comments: Left knee pain with flexion.  Left knee with tenderness and mild edema   Skin:    General: Skin is warm and dry.     Comments: Tinea pedis bilaterally with cracked right heel and thick toe nails.    Neurological:     Mental Status: She is alert and oriented to person, place, and time.     Comments: Alert Motor: Bilateral upper extremities: Shoulder abduction, elbow flexion/extension 4/5, wrist extension 3+/5, and intrinsics 2/5 Right lower extremity: Hip flexion, knee extension 2+/5, ankle dorsiflexion 4/5 Left lower extremity: Hip flexion, knee extension 3 -/5, ankle dorsiflexion 4/5 Sensation to  light touch diminished distal to elbows and knees  Psychiatric:        Mood and Affect: Mood normal.        Behavior: Behavior normal.     Results for orders placed or performed during the hospital encounter of 07/03/20 (from the past 48 hour(s))  CBC     Status: Abnormal   Collection Time: 07/11/20  4:51 AM  Result Value Ref Range   WBC 6.2 4.0 - 10.5 K/uL   RBC 3.59 (L) 3.87 - 5.11 MIL/uL   Hemoglobin 12.6 12.0 - 15.0 g/dL   HCT 34.7 42.5 - 95.6 %   MCV 107.0 (H) 80.0 - 100.0 fL   MCH 35.1 (H) 26.0 - 34.0 pg   MCHC 32.8 30.0 - 36.0 g/dL   RDW 38.7 56.4 - 33.2 %   Platelets 331 150 - 400 K/uL   nRBC 0.0 0.0 - 0.2 %    Comment: Performed at [facility], [address] [place], [place] [address]   No results found.     Medical Problem List and Plan: 1.  Diffuse weakness, difficulty standing, staggering gait secondary to cervical myelopathy with quadriparesis.  -patient may shower  -ELOS/Goals: 14 to 17 days/supervision/min a  Admit to CIR 2.  Antithrombotics: -DVT/anticoagulation:  Pharmaceutical: Lovenox  -antiplatelet therapy: N/A 3. Pain Management: Oxycodone prn.   Monitor with increased exertion, particularly neuropathic pain 4. Mood: LCSW to follow for evaluation and support.   -antipsychotic agents: N/A 5. Neuropsych: This patient is capable of making decisions on her own behalf. 6. Skin/Wound Care:  Monitor incision for healing.  Added protein supplement to help promote healing.  7. Fluids/Electrolytes/Nutrition: Monitor I/Os.   CMP ordered for tomorrow 8. ABLA:   CBC ordered for tomorrow 9. Hypokalemia/Hypomagnesemia: Has resolved with brief supplementation.   Labs ordered for tomorrow 10. Macrocytosis/Polysubstance abuse:   Vitamin B12/folate levels ordered.  11. Constipation: KUB ordered to determine stool burden.   Jacquelynn Cree, PA-C 07/11/2020  I have personally performed a face to face diagnostic evaluation, including, but not limited to  relevant history and physical exam findings, of this patient and developed relevant assessment and plan.  Additionally, I have reviewed and concur with the physician assistant's documentation above.  Maryla Morrow, MD, ABPMR  The patient's status has not changed. Any changes from the pre-admission screening or documentation from the acute chart are noted above.   Maryla Morrow, MD, ABPMR

## 2020-07-12 DIAGNOSIS — S14124D Central cord syndrome at C4 level of cervical spinal cord, subsequent encounter: Principal | ICD-10-CM

## 2020-07-12 LAB — COMPREHENSIVE METABOLIC PANEL
ALT: 13 U/L (ref 0–44)
AST: 17 U/L (ref 15–41)
Albumin: 2.8 g/dL — ABNORMAL LOW (ref 3.5–5.0)
Alkaline Phosphatase: 64 U/L (ref 38–126)
Anion gap: 9 (ref 5–15)
BUN: 9 mg/dL (ref 8–23)
CO2: 27 mmol/L (ref 22–32)
Calcium: 9.5 mg/dL (ref 8.9–10.3)
Chloride: 101 mmol/L (ref 98–111)
Creatinine, Ser: 0.82 mg/dL (ref 0.44–1.00)
GFR, Estimated: >60 mL/min (ref >60)
Glucose, Bld: 91 mg/dL (ref 70–99)
Potassium: 4.1 mmol/L (ref 3.5–5.1)
Sodium: 137 mmol/L (ref 135–145)
Total Bilirubin: 0.5 mg/dL (ref 0.3–1.2)
Total Protein: 6.1 g/dL — ABNORMAL LOW (ref 6.5–8.1)

## 2020-07-12 LAB — CBC WITH DIFFERENTIAL/PLATELET
Abs Immature Granulocytes: 0.02 10*3/uL (ref 0.00–0.07)
Basophils Absolute: 0 10*3/uL (ref 0.0–0.1)
Basophils Relative: 0 %
Eosinophils Absolute: 0.3 10*3/uL (ref 0.0–0.5)
Eosinophils Relative: 4 %
HCT: 40.4 % (ref 36.0–46.0)
Hemoglobin: 13.4 g/dL (ref 12.0–15.0)
Immature Granulocytes: 0 %
Lymphocytes Relative: 25 %
Lymphs Abs: 1.7 10*3/uL (ref 0.7–4.0)
MCH: 35.6 pg — ABNORMAL HIGH (ref 26.0–34.0)
MCHC: 33.2 g/dL (ref 30.0–36.0)
MCV: 107.4 fL — ABNORMAL HIGH (ref 80.0–100.0)
Monocytes Absolute: 0.9 10*3/uL (ref 0.1–1.0)
Monocytes Relative: 13 %
Neutro Abs: 3.9 10*3/uL (ref 1.7–7.7)
Neutrophils Relative %: 58 %
Platelets: 346 10*3/uL (ref 150–400)
RBC: 3.76 MIL/uL — ABNORMAL LOW (ref 3.87–5.11)
RDW: 13.1 % (ref 11.5–15.5)
WBC: 6.8 10*3/uL (ref 4.0–10.5)
nRBC: 0 % (ref 0.0–0.2)

## 2020-07-12 LAB — FOLATE: Folate: 13.2 ng/mL (ref >5.9)

## 2020-07-12 LAB — MAGNESIUM: Magnesium: 1.9 mg/dL (ref 1.7–2.4)

## 2020-07-12 LAB — CK: Total CK: 31 U/L — ABNORMAL LOW (ref 38–234)

## 2020-07-12 MED ORDER — BACLOFEN 10 MG PO TABS
10.0000 mg | ORAL_TABLET | Freq: Three times a day (TID) | ORAL | Status: DC
  Administered 2020-07-12 – 2020-07-29 (×53): 10 mg via ORAL
  Filled 2020-07-12 (×53): qty 1

## 2020-07-12 MED ORDER — BISACODYL 10 MG RE SUPP
10.0000 mg | Freq: Every day | RECTAL | Status: DC
Start: 2020-07-12 — End: 2020-07-25
  Administered 2020-07-12 – 2020-07-24 (×12): 10 mg via RECTAL
  Filled 2020-07-12 (×14): qty 1

## 2020-07-12 MED ORDER — ENOXAPARIN SODIUM 40 MG/0.4ML ~~LOC~~ SOLN
40.0000 mg | SUBCUTANEOUS | Status: DC
  Administered 2020-07-13 – 2020-08-02 (×21): 40 mg via SUBCUTANEOUS
  Filled 2020-07-12 (×22): qty 0.4

## 2020-07-12 MED ORDER — PREGABALIN 50 MG PO CAPS
50.0000 mg | ORAL_CAPSULE | Freq: Three times a day (TID) | ORAL | Status: DC
  Administered 2020-07-12 – 2020-07-15 (×10): 50 mg via ORAL
  Filled 2020-07-12 (×10): qty 1

## 2020-07-12 NOTE — Evaluation (Signed)
Speech Language Pathology Assessment and Plan  Patient Details  Name: Yvette Phillips MRN: [id number] Date of Birth: 1960-11-26  SLP Diagnosis: Dysphagia  Rehab Potential: Good ELOS: TBD for ST pending MBS results    Today's Date: 07/12/2020 SLP Individual Time: 0800-0850 SLP Individual Time Calculation (min): 50 min   Hospital Problem: Principal Problem:   Central cord syndrome at C4 level of cervical spinal cord, subsequent encounter [facility])  Past Medical History: History reviewed. No pertinent past medical history. Past Surgical History:  Past Surgical History:  Procedure Laterality Date  . ANTERIOR CERVICAL DECOMP/DISCECTOMY FUSION N/A 07/04/2020   Procedure: ANTERIOR CERVICAL DECOMPRESSION/DISCECTOMY FUSION 1 LEVEL;  Surgeon: Meade Maw, MD;  Location: [facility] ORS;  Service: Neurosurgery;  Laterality: N/A;    Assessment / Plan / Recommendation Clinical Impression Patient presents with cognitive-linguistic voice and speech abilities that are [facility]. Bedside swallow evaluation did reveal potential impairment with patient exhibiting a delayed cough response, c/o globus sensation as well as reports of "getting strangled." Patient is s/p ACDF and as per SLP assessments during acute care, likely has some pharyngeal edema contributing to swallowing difficulties. Esophagram did not reveal any abnormalities. SLP plans to complete an MBS with patient next date to r/o aspiraiton, penetration or pharyngeal residuals/retention of boluses and will determine need for continued ST intervention after this test.  Skilled Therapeutic Interventions          Bedside swallow evaluation, speech-language cognitive evaluation  SLP Assessment  Patient will need skilled Speech Lanaguage Pathology Services during CIR admission    Recommendations  SLP Diet Recommendations: Thin;Dysphagia 2 (Fine chop) Liquid Administration via: Cup;Straw Medication Administration: Whole meds with liquid Supervision: Staff to  assist with self feeding;Full supervision/cueing for compensatory strategies;Patient able to self feed Compensations: Minimize environmental distractions;Slow rate;Small sips/bites Postural Changes and/or Swallow Maneuvers: Seated upright 90 degrees Oral Care Recommendations: Oral care BID Patient destination: Home Follow up Recommendations: None Equipment Recommended: None recommended by SLP    SLP Frequency   TBD pending MBS  SLP Duration  SLP Intensity  SLP Treatment/Interventions TBD for ST pending MBS results   (TBD pending MBS results)  Dysphagia/aspiration precaution training;Patient/family education    Pain Pain Assessment Pain Scale: 0-10 Pain Score: 5  (with standing) Pain Type: Acute pain Pain Location: Foot Pain Orientation: Right;Left Pain Descriptors / Indicators: Constant (with WB per pt)  Prior Functioning Cognitive/Linguistic Baseline: Within functional limits Type of Home: Apartment  Lives With: Alone Available Help at Discharge: Family;Available 24 hours/day (pt reports her boyfriend can assist and is avaible AAT) 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)  SLP Evaluation Cognition Overall Cognitive Status: Within Functional Limits for tasks assessed Arousal/Alertness: Awake/alert Orientation Level: Oriented X4 Attention: Focused;Sustained Focused Attention: Appears intact Sustained Attention: Appears intact Memory: Appears intact Immediate Memory Recall: Sock;Blue;Bed Memory Recall Sock: Without Cue Memory Recall Blue: Without Cue Memory Recall Bed: Not able to recall Awareness: Appears intact Executive Function: Reasoning Reasoning: Appears intact Safety/Judgment: Appears intact  Comprehension Auditory Comprehension Overall Auditory Comprehension: Appears within functional limits for tasks assessed Expression Expression Primary Mode of Expression: Verbal Verbal Expression Overall Verbal Expression: Appears within  functional limits for tasks assessed Written Expression Dominant Hand: Right Oral Motor Oral Motor/Sensory Function Overall Oral Motor/Sensory Function: Within functional limits Motor Speech Overall Motor Speech: Appears within functional limits for tasks assessed  Care Tool Care Tool Cognition Expression of Ideas and Wants Expression of Ideas and Wants: Without difficulty (complex and basic) - expresses complex messages  without difficulty and with speech that is clear and easy to understand   Understanding Verbal and Non-Verbal Content Understanding Verbal and Non-Verbal Content: Understands (complex and basic) - clear comprehension without cues or repetitions   Memory/Recall Ability *first 3 days only Memory/Recall Ability *first 3 days only: Current season;Staff names and faces;That he or she is in a hospital/hospital unit;Location of own room      Bedside Swallowing Assessment General Date of Onset: 07/12/20 Previous Swallow Assessment: BSE 2/11 Diet Prior to this Study: Thin liquids;Dysphagia 2 (chopped) Temperature Spikes Noted: No Respiratory Status: Room air History of Recent Intubation: Yes Length of Intubations (days): 1 days Date extubated: 07/04/20 Behavior/Cognition: Alert;Cooperative;Pleasant mood Oral Cavity - Dentition: Poor condition;Missing dentition Self-Feeding Abilities: Able to feed self Vision: Functional for self-feeding Patient Positioning: Upright in bed Baseline Vocal Quality: Normal Volitional Cough: Strong Volitional Swallow: Able to elicit  Oral Care Assessment   Ice Chips Ice chips: Not tested Thin Liquid Thin Liquid: Impaired Presentation: Straw Pharyngeal  Phase Impairments: Cough - Delayed Other Comments: unable to determine if very delayed cough was from thin liquid PO's    Puree Puree: Within functional limits Solid Solid: Not tested BSE Assessment Suspected Esophageal Findings Suspected Esophageal Findings: Globus  sensation Risk for Aspiration Impact on safety and function: Mild aspiration risk Other Related Risk Factors: History of GERD  Short Term Goals: Week 1: SLP Short Term Goal 1 (Week 1): Patient will participate in MBS to determine current baseline swallow function and determine need for continued SLP intervention.  Refer to Care Plan for Long Term Goals  Recommendations for other services: Neuropsych  Discharge Criteria: Patient will be discharged from SLP if patient refuses treatment 3 consecutive times without medical reason, if treatment goals not met, if there is a change in medical status, if patient makes no progress towards goals or if patient is discharged from hospital.  The above assessment, treatment plan, treatment alternatives and goals were discussed and mutually agreed upon: by patient   Sonia Baller, MA, CCC-SLP Speech Therapy

## 2020-07-12 NOTE — Progress Notes (Signed)
Inpatient Rehabilitation  Patient information reviewed and entered into eRehab system by Jailynn Lavalais M. Windsor Goeken, M.A., CCC/SLP, PPS Coordinator.  Information including medical coding, functional ability and quality indicators will be reviewed and updated through discharge.    

## 2020-07-12 NOTE — Evaluation (Signed)
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 No pertinent past medical history. Past Surgical History:  Past Surgical History:  Procedure Laterality Date  . ANTERIOR CERVICAL DECOMP/DISCECTOMY FUSION N/A 07/04/2020   Procedure: ANTERIOR CERVICAL DECOMPRESSION/DISCECTOMY FUSION 1 LEVEL;  Surgeon: Meade Maw, MD;  Location: [facility] ORS;  Service: Neurosurgery;  Laterality: N/A;    Assessment & Plan Clinical Impression: Yvette Phillips is a 60 year old female with history of fall around christmas with progressive weakness and admitted [facility] 12/27- 05/26/20 with work up revealing C4/C5 disc herniation with severe spinal stenosis and cord compression, central cord syndrome, hypokalemia as well as mild Covid 19 infection.  History taken from chart review and patient.  She was set for outpatient neurosurgery follow up but continued to have BUE/BLE weakness, sustained a fall the evening before.  Her boyfriend put her to bed and called EMS the next day.  She was found after a well check and admitted to [facility] on 07/03/2020.  Urine drug screen positive cocaine and THC.  She was found to have electrolyte abnormality with potassium 3.4, Mg 1.5 and CK-375.  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 MRI C spine showed large central disc protrusion at C4-5 with severe spinal stenosis and compressive myelopathy as well as severe right C4/C5 foraminal stenosis. Right knee contusion noted and X rays negative for  fracture.    Neurosurgery evaluated patient with exam revealing sensory deficits bilateral hands below forearm and bilateral lower extremities as well as 1/5 strength bilateral grip/wrist extension and 4/5 left biceps/triceps/iliopsoas/quads. MAPs. >80s recommended and she was taken to OR on 07/04/2020 for ACDF of C4/C5  by  Dr.  Izora Ribas.  Hospital course further complicated by resulting post op diffuse weakness a s well as difficulty with swallowing.  Barium swallow done 02/11 due to difficulty swallowing and was negative for mas, stricture or ulceration. She was evaluated by ST and started on dysphagia 2, thins recommended with assistance for feeding. Therapy ongoing and patient limited by BLE/BUE weakness with difficulty standing and staggering gait, is unable to feed herself or perform ADLs. CIR recommended due to functional decline.  Please see preadmission assessment from earlier today as well. Patient transferred to CIR on 07/11/2020 .    Patient currently requires max with basic self-care skills secondary to muscle weakness, decreased cardiorespiratoy endurance, impaired timing and sequencing, abnormal tone, unbalanced muscle activation, ataxia, decreased coordination and decreased motor planning, decreased motor planning and decreased sitting balance, decreased standing balance, decreased postural control and decreased balance strategies.  Prior to hospitalization, patient could complete BADL and IADLs with independent .  Patient will benefit from skilled intervention to decrease level of assist with basic self-care skills and increase independence with basic self-care skills prior to discharge home with care partner.  Anticipate patient will require intermittent supervision and follow up home health.  OT - End of Session Activity Tolerance: Tolerates 30+ min activity with multiple rests Endurance Deficit: Yes OT Assessment OT Barriers to Discharge: Decreased caregiver support OT Patient  demonstrates impairments  in the following area(s): Balance;Endurance;Motor;Safety;Sensory;Skin Integrity OT Basic ADL's Functional Problem(s): Eating;Grooming;Bathing;Dressing;Toileting OT Transfers Functional Problem(s): Toilet;Tub/Shower OT Additional Impairment(s): None OT Plan OT Intensity: Minimum of 1-2 x/day, 45 to 90 minutes OT Frequency: 5 out of 7 days OT Duration/Estimated Length of Stay: 18-21 days OT Treatment/Interventions: Balance/vestibular training;Discharge planning;Functional electrical stimulation;Self Care/advanced ADL retraining;Therapeutic Activities;UE/LE Coordination activities;Cognitive remediation/compensation;Disease mangement/prevention;Functional mobility training;Patient/family education;Skin care/wound managment;Therapeutic Exercise;Visual/perceptual remediation/compensation;Wheelchair propulsion/positioning;UE/LE Strength taining/ROM;Splinting/orthotics;Psychosocial support;Neuromuscular re-education;DME/adaptive equipment instruction;Community reintegration OT Self Feeding Anticipated Outcome(s): Mod I OT Basic Self-Care Anticipated Outcome(s): Min A OT Toileting Anticipated Outcome(s): Min A OT Bathroom Transfers Anticipated Outcome(s): CGA OT Recommendation Recommendations for Other Services: Neuropsych consult Patient destination: Home Follow Up Recommendations: Home health OT Equipment Recommended: To be determined   OT Evaluation Precautions/Restrictions  Precautions Precautions: Fall;Cervical Precaution Comments: s/p ACDF C4-5 (no cervical brace needed per acute), BUE/LE weakness Restrictions Weight Bearing Restrictions: No Pain Pain Assessment Pain Scale: 0-10 Pain Score: 0-No pain Home Living/Prior Functioning Home Living Family/patient expects to be discharged to:: Private residence Living Arrangements: Alone Available Help at Discharge: Family,Other (Comment) (pt reports bf lives close by and can help PRN, reports community/friend  supports) Type of Home: Apartment Home Access: Stairs to enter [employer] of Steps: 5 (per staff notes) Home Layout: One level Bathroom Shower/Tub: Multimedia programmer: Standard Bathroom Accessibility: Yes Additional Comments: Pt reports living in studio apt/duplex. 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 Prior Function Level of Independence: Independent with basic ADLs,Independent with homemaking with ambulation,Independent with gait,Independent with transfers (prior to fall in December)  Able to Take Stairs?: Yes Comments: prior to fall Christmas 2021 [place] with self care, working at group home Vision Baseline Vision/History: No visual deficits Patient Visual Report: No change from baseline Perception  Perception: Within Functional Limits Praxis Praxis: Impaired Cognition Overall Cognitive Status: Within Functional Limits for tasks assessed Arousal/Alertness: Awake/alert Orientation Level: Person;Place;Situation Person: Oriented Place: Oriented Situation: Oriented Year: 2022 Month: February Day of Week: Correct Memory: Appears intact Immediate Memory Recall: Sock;Blue;Bed Memory Recall Sock: Without Cue Memory Recall Blue: Without Cue Memory Recall Bed: Not able to recall Attention: Focused;Sustained Focused Attention: Appears intact Sustained Attention: Appears intact Awareness: Appears intact Safety/Judgment: Appears intact Sensation Sensation Light Touch: Impaired by gross assessment Peripheral sensation comments: reports feeling "fuzzy", decreased sensation in BLEs, decreased in [place] as well Light Touch Impaired Details: Impaired RUE;Impaired LUE;Impaired RLE;Impaired LLE Coordination Gross Motor Movements are Fluid and Coordinated: No Fine Motor Movements  are Fluid and Coordinated: No Finger Nose Finger Test: limited grossly d/t weakness (more weakness distal > proximal) Motor  Motor Motor: Abnormal postural alignment and control;Tetraplegia Motor - Skilled Clinical Observations: decreased GM/FMC in BUEs, weakness in BLEs as well  Trunk/Postural Assessment  Cervical Assessment Cervical Assessment: Exceptions to [facility] (s/p ACDF, cervical precautions) Thoracic Assessment Thoracic Assessment: Exceptions to [facility] (rounded shoulders) Lumbar Assessment Lumbar Assessment: Exceptions to [facility] (posterior pelvic tilt in sitting) Postural Control Postural Control: Deficits on evaluation  Balance Balance Balance Assessed: Yes Static Sitting Balance Static Sitting - Balance Support: Feet unsupported;Bilateral upper extremity supported Static Sitting - Level of Assistance: 5: Stand by assistance Dynamic Sitting Balance Dynamic Sitting - Balance Support: Feet unsupported;Bilateral upper extremity supported Dynamic Sitting - Level of Assistance: 4: Min assist Dynamic Sitting - Balance Activities: Forward lean/weight shifting;Reaching for objects;Lateral lean/weight shifting Static Standing  Balance Static Standing - Balance Support: During functional activity;Bilateral upper extremity supported Static Standing - Level of Assistance: 3: Mod assist Dynamic Standing Balance Dynamic Standing - Balance Support: Bilateral upper extremity supported;During functional activity Dynamic Standing - Level of Assistance: 3: Mod assist Extremity/Trunk Assessment RUE Assessment RUE Assessment: Exceptions to [facility] Active Range of Motion (AROM) Comments: approx 100* shoulder flexion/abduction, WFL for ROM elbow/wrist, limited at digits able to make gross grasp General Strength Comments: roughly 4/5 shoulders and bicep, 3+/5 tricep, roughly 2/5 grip LUE Assessment LUE Assessment: Exceptions to [facility] Active Range of Motion (AROM) Comments: approx 100* shoulder  flexion/abduction, WFL for ROM elbow/wrist, limited at digits able to make gross grasp General Strength Comments: roughly 4/5 shoulders and bicep, 3+/5 tricep, roughly 2/5 grip  Care Tool Care Tool Self Care Eating   Eating Assist Level: Set up assist    Oral Care    Oral Care Assist Level: Moderate Assistance - Patient 50 - 74%    Bathing   Body parts bathed by patient: Right arm;Left arm;Chest;Abdomen;Front perineal area;Right upper leg;Left upper leg;Face Body parts bathed by helper: Buttocks;Right lower leg;Left lower leg;Front perineal area   Assist Level: Maximal Assistance - Patient 24 - 49%    Upper Body Dressing(including orthotics)   What is the patient wearing?: Pull over shirt   Assist Level: Moderate Assistance - Patient 50 - 74%    Lower Body Dressing (excluding footwear)   What is the patient wearing?: Incontinence brief;Pants Assist for lower body dressing: Total Assistance - Patient < 25%    Putting on/Taking off footwear   What is the patient wearing?: Non-skid slipper socks Assist for footwear: Dependent - Patient 0%       Care Tool Toileting Toileting activity   Assist for toileting: Total Assistance - Patient < 25%     Care Tool Bed Mobility Roll left and right activity   Roll left and right assist level: Minimal Assistance - Patient > 75%    Sit to lying activity    Max A    Lying to sitting edge of bed activity   Lying to sitting edge of bed assist level: Maximal Assistance - Patient 25 - 49%     Care Tool Transfers Sit to stand transfer   Sit to stand assist level: Maximal Assistance - Patient 25 - 49%    Chair/bed transfer   Chair/bed transfer assist level: Maximal Assistance - Patient 25 - 49%     Toilet transfer   Assist Level: Maximal Assistance - Patient 24 - 49%     Care Tool Cognition Expression of Ideas and Wants Expression of Ideas and Wants: Without difficulty (complex and basic) - expresses complex messages without  difficulty and with speech that is clear and easy to understand   Understanding Verbal and Non-Verbal Content Understanding Verbal and Non-Verbal Content: Understands (complex and basic) - clear comprehension without cues or repetitions   Memory/Recall Ability *first 3 days only Memory/Recall Ability *first 3 days only: Current season;Staff names and faces;That he or she is in a hospital/hospital unit    Refer to Care Plan for [place] 1 OT Short Term Goal 1 (Week 1): Pt will perform self feeding with AE with Supervision OT Short Term Goal 2 (Week 1): Pt will perform sit > stand at AD with no more than Mod A in prep for LB ADL OT Short Term Goal 3 (Week 1): Pt will perform BSC/toilet transfers with no more  than Mod of 1 OT Short Term Goal 4 (Week 1): Pt will increase functional grip strength to perform oral hygiene w/ Supervision  Recommendations for other services: Neuropsych   Skilled Therapeutic Intervention ADL ADL Equipment Provided: Feeding equipment Eating: Not assessed (not fully assessed, does have built up handles and able to simulate) Grooming: Moderate assistance Upper Body Bathing: Minimal assistance Where Assessed-Upper Body Bathing: Sitting at sink Lower Body Bathing: Maximal assistance Where Assessed-Lower Body Bathing: Sitting at sink;Standing at sink Upper Body Dressing: Moderate assistance Lower Body Dressing: Dependent Where Assessed-Lower Body Dressing: Sitting at sink;Standing at sink Toileting: Not assessed Toilet Transfer: Maximal assistance Mobility  Bed Mobility Bed Mobility: Rolling Right;Rolling Left;Right Sidelying to Sit Rolling Right: Minimal Assistance - Patient > 75% Rolling Left: Minimal Assistance - Patient > 75% Right Sidelying to Sit: Maximal Assistance - Patient 25-49% Transfers Sit to Stand: Maximal Assistance - Patient 25-49% Stand to Sit: Moderate Assistance - Patient 50-74%;Maximal Assistance - Patient  25-49%    Intervention: Pt greeted at time of session supine in bed resting comfortably, discussion regarding role and purpose of OT with pt verbalizing understanding. Eager to participate and "get better."  Reviewed cervical precautions, did not remember from previous therapies but verbalized understanding now. Pt with soiled brief urine only, dependent brief change at bed level with pt rolling L/R with Min A with pt able to help with some hygiene in periarea using R hand. Supine > sit Max A with Mod A initially for sitting balance decreasing to CGA/close supervision. Sit > stand from bed with Max A and stand pivot to wheelchair in same manner with noted knee buckle but able to recover. Sink level bathing Max for LB and Min A for UB, compensatory and adaptive techniques reviewed d/t decreased grip and in hand manipulation. UB dress Mod A pull over shirt, sit > stand at sink level Max A and therapist donned pants total A. Pt agreeable to sit up in wheelchair for lunch until PT session. No dizziness or lightheadedness throughout, feeling good and no pain reported. Up in chair with call bell in reach alarm on all needs met. Also reviewed use of AE built up handles for toothbrush and utensils for self feeding.    Discharge Criteria: Patient will be discharged from OT if patient refuses treatment 3 consecutive times without medical reason, if treatment goals not met, if there is a change in medical status, if patient makes no progress towards goals or if patient is discharged from hospital.  The above assessment, treatment plan, treatment alternatives and goals were discussed and mutually agreed upon: by patient  Viona Gilmore 07/12/2020, 12:55 PM

## 2020-07-12 NOTE — Progress Notes (Signed)
Pt had one emesis episode with no other symptoms besides nausea. PRN med given with ginger ale. RN will continue to monitor. Pt stable at this time.

## 2020-07-12 NOTE — Progress Notes (Signed)
PROGRESS NOTE   Subjective/Complaints:    Pt reports has no control of bowels OR bladder- just goes without control most of the time.  Otherwise, "everything fine". But when asked more questions, having a lot of issues.   Peeing a lot- but she thinks she's emptying- per PVRs, 0-100cc.   Has chronic L knee pain.   C/o muscle spasms that's awful- very painful Also c/o nerve pain- her feet on "fire"- burning.    ROS:  Pt denies SOB, abd pain, CP, N/V/C/D, and vision changes   Objective:   DG Abd 1 View  Result Date: 07/11/2020 CLINICAL DATA:  Constipation. EXAM: ABDOMEN - 1 VIEW COMPARISON:  No recent. FINDINGS: Soft tissue structures are unremarkable. Oral contrast noted throughout the colon and rectum. Moderate stool volume right colon. No bowel distention. No free air. Degenerative change lumbar spine. Small well-circumscribed sclerotic density right ilium, most likely bone island. Pelvic calcifications consistent phleboliths. IMPRESSION: Oral contrast noted throughout the colon and rectum. Moderate stool volume right colon. No bowel distention. Electronically Signed   By: Maisie Fus  Register   On: 07/11/2020 16:53   Recent Labs    07/11/20 0451 07/12/20 0513  WBC 6.2 6.8  HGB 12.6 13.4  HCT 38.4 40.4  PLT 331 346   Recent Labs    07/12/20 0513  NA 137  K 4.1  CL 101  CO2 27  GLUCOSE 91  BUN 9  CREATININE 0.82  CALCIUM 9.5    Intake/Output Summary (Last 24 hours) at 07/12/2020 1008 Last data filed at 07/12/2020 0900 Gross per 24 hour  Intake 297 ml  Output --  Net 297 ml        Physical Exam: Vital Signs Blood pressure 133/66, pulse 90, temperature (!) 97.5 F (36.4 C), temperature source Oral, resp. rate 18, height 5\' 7"  (1.702 m), weight 81.9 kg, SpO2 97 %.  Constitutional:  sitting up in bed; appropriate, very honest about medical issues, NAD HENT: smile equal; conjugate gaze  Cardiovascular:  RRR_ no JVD  Pulmonary: CTA B/L- no W/R/R- good air movement  Abdominal: soft, NT, ND< normoactive BS this AM (LBM last night) Musculoskeletal:     Cervical back: Normal range of motion and neck supple.     Comments: Left knee pain with flexion.  Left knee with tenderness and mild edema   Skin:    General: Skin is warm and dry.     Comments: Tinea pedis bilaterally with cracked right heel and thick toe nails.    Neurological: Ox3   MAS of 1 to 1+ in UEs/LEs with Hoffman's brisk (+) in UEs Motor: Bilateral upper extremities: Shoulder abduction, elbow flexion/extension 4/5, wrist extension 3+/5, and intrinsics 2/5 Right lower extremity: Hip flexion, knee extension 2+/5, ankle dorsiflexion 4/5 Left lower extremity: Hip flexion, knee extension 3 -/5, ankle dorsiflexion 4/5 Sensation to light touch diminished distal to elbows and knees  Psychiatric:    appropriate   Assessment/Plan: 1. Functional deficits which require 3+ hours per day of interdisciplinary therapy in a comprehensive inpatient rehab setting.  Physiatrist is providing close team supervision and 24 hour management of active medical problems listed below.  Physiatrist and rehab team  continue to assess barriers to discharge/monitor patient progress toward functional and medical goals  Care Tool:  Bathing              Bathing assist       Upper Body Dressing/Undressing Upper body dressing        Upper body assist      Lower Body Dressing/Undressing Lower body dressing      What is the patient wearing?: Incontinence brief     Lower body assist Assist for lower body dressing: Moderate Assistance - Patient 50 - 74%     Toileting Toileting    Toileting assist Assist for toileting: Total Assistance - Patient < 25% (Incontinent episode, incontinent brief.)     Transfers Chair/bed transfer  Transfers assist           Locomotion Ambulation   Ambulation assist              Walk 10 feet  activity   Assist           Walk 50 feet activity   Assist           Walk 150 feet activity   Assist           Walk 10 feet on uneven surface  activity   Assist           Wheelchair     Assist               Wheelchair 50 feet with 2 turns activity    Assist            Wheelchair 150 feet activity     Assist          Blood pressure 133/66, pulse 90, temperature (!) 97.5 F (36.4 C), temperature source Oral, resp. rate 18, height 5\' 7"  (1.702 m), weight 81.9 kg, SpO2 97 %.  Medical Problem List and Plan: 1.  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             -patient may shower             -ELOS/Goals: 14 to 17 days/supervision/min a             Admit to CIR 2.  Antithrombotics:  -DVT/anticoagulation:  Pharmaceutical: Lovenox  2/17- Very high risk of DVT due to SCI-will need 2-3 months of Lovenox per CHEST guidelines.              -antiplatelet therapy: N/A 3. Pain Management: Oxycodone prn.              Monitor with increased exertion, particularly neuropathic pain 4. Mood: LCSW to follow for evaluation and support.              -antipsychotic agents: N/A 5. Neuropsych: This patient is capable of making decisions on her own behalf. 6. Skin/Wound Care:  Monitor incision for healing.  Added protein supplement to help promote healing.  7. Fluids/Electrolytes/Nutrition: Monitor I/Os.              CMP ordered for tomorrow 8. ABLA:              CBC ordered for tomorrow 9. Hypokalemia/Hypomagnesemia: Has resolved with brief supplementation.              Labs ordered for tomorrow 10. Macrocytosis/Polysubstance abuse:              Vitamin B12/folate levels ordered.  217- counseling given about polysubstance abuse 11. Neurogenic bowel and constipation: KUB ordered to determine stool burden.  2/17- moderate R sided stool burden  with a lot of contrast left in colon- will order bowel program with dig stim nightly and con't Miralax BID for now- she has no control of stool per pt.  12. Neurogenic bladder  2/17- PVRs 0-100 cc so far- not on Flomax- con't regimen for now- has no control of bladder, of note.  13. Neuropathic pain  2/17- start Lyrica 50 mg TID for nerve pain- hopefully will help "being on fire".  14. Spasticity  2/17- will start Baclofen 5 mg QID for muscle tightness/spasms.      LOS: 1 days A FACE TO FACE EVALUATION WAS PERFORMED  Yvette Phillips 07/12/2020, 10:08 AM

## 2020-07-12 NOTE — Plan of Care (Signed)
  Problem: RH Balance Goal: LTG: Patient will maintain dynamic sitting balance (OT) Description: LTG:  Patient will maintain dynamic sitting balance with assistance during activities of daily living (OT) Flowsheets (Taken 07/12/2020 1705) LTG: Pt will maintain dynamic sitting balance during ADLs with: Supervision/Verbal cueing Goal: LTG Patient will maintain dynamic standing with ADLs (OT) Description: LTG:  Patient will maintain dynamic standing balance with assist during activities of daily living (OT)  Flowsheets (Taken 07/12/2020 1705) LTG: Pt will maintain dynamic standing balance during ADLs with: Minimal Assistance - Patient > 75%   Problem: Sit to Stand Goal: LTG:  Patient will perform sit to stand in prep for activites of daily living with assistance level (OT) Description: LTG:  Patient will perform sit to stand in prep for activites of daily living with assistance level (OT) Flowsheets (Taken 07/12/2020 1705) LTG: PT will perform sit to stand in prep for activites of daily living with assistance level: Contact Guard/Touching assist   Problem: RH Eating Goal: LTG Patient will perform eating w/assist, cues/equip (OT) Description: LTG: Patient will perform eating with assist, with/without cues using equipment (OT) Flowsheets (Taken 07/12/2020 1705) LTG: Pt will perform eating with assistance level of: Independent with assistive device    Problem: RH Grooming Goal: LTG Patient will perform grooming w/assist,cues/equip (OT) Description: LTG: Patient will perform grooming with assist, with/without cues using equipment (OT) Flowsheets (Taken 07/12/2020 1705) LTG: Pt will perform grooming with assistance level of: Set up assist    Problem: RH Bathing Goal: LTG Patient will bathe all body parts with assist levels (OT) Description: LTG: Patient will bathe all body parts with assist levels (OT) Flowsheets (Taken 07/12/2020 1705) LTG: Pt will perform bathing with assistance level/cueing:  Minimal Assistance - Patient > 75%   Problem: RH Dressing Goal: LTG Patient will perform upper body dressing (OT) Description: LTG Patient will perform upper body dressing with assist, with/without cues (OT). Flowsheets (Taken 07/12/2020 1705) LTG: Pt will perform upper body dressing with assistance level of: Supervision/Verbal cueing Goal: LTG Patient will perform lower body dressing w/assist (OT) Description: LTG: Patient will perform lower body dressing with assist, with/without cues in positioning using equipment (OT) Flowsheets (Taken 07/12/2020 1705) LTG: Pt will perform lower body dressing with assistance level of: Minimal Assistance - Patient > 75%   Problem: RH Toileting Goal: LTG Patient will perform toileting task (3/3 steps) with assistance level (OT) Description: LTG: Patient will perform toileting task (3/3 steps) with assistance level (OT)  Flowsheets (Taken 07/12/2020 1705) LTG: Pt will perform toileting task (3/3 steps) with assistance level: Minimal Assistance - Patient > 75%   Problem: RH Toilet Transfers Goal: LTG Patient will perform toilet transfers w/assist (OT) Description: LTG: Patient will perform toilet transfers with assist, with/without cues using equipment (OT) Flowsheets (Taken 07/12/2020 1705) LTG: Pt will perform toilet transfers with assistance level of: Minimal Assistance - Patient > 75%

## 2020-07-12 NOTE — Progress Notes (Signed)
Pt completed dinner at 1820 and bowel program started at 1830. Rectal vault assessed for impaction. Stool is soft and mushy, unable to manually remove. Pt reported intact rectal sensation.  No result until end of shift. Reported off to oncoming staff.  Marylu Lund, RN

## 2020-07-12 NOTE — Progress Notes (Addendum)
RN assessed rectum at 2038. RN performed dig stimulation. BM was medium, soft and brown. Pt had one bm prior with tech. Pt stated, " I feel like I have to pass more." Pt had 2 more small soft BM. RN will continue to monitor. Pt stable at this time.

## 2020-07-12 NOTE — Evaluation (Signed)
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 No pertinent past medical history. Past Surgical History:  Past Surgical History:  Procedure Laterality Date  . ANTERIOR CERVICAL DECOMP/DISCECTOMY FUSION N/A 07/04/2020   Procedure: ANTERIOR CERVICAL DECOMPRESSION/DISCECTOMY FUSION 1 LEVEL;  Surgeon: Meade Maw, MD;  Location: [facility] ORS;  Service: Neurosurgery;  Laterality: N/A;    Assessment & Plan Clinical Impression: Patient is a 60 y.o. year old female with history of fall around christmas with progressive weakness and admitted [facility] 12/27- 05/26/20 with work up revealing C4/C5 disc herniation with severe spinal stenosis and cord compression, central cord syndrome, hypokalemia as well as mild Covid 19 infection.  History taken from chart review and patient.  She was set for outpatient neurosurgery follow up but continued to have BUE/BLE weakness, sustained a fall the evening before.  Her boyfriend put her to bed and called EMS the next day.  She was found after a well check and admitted to [facility] on 07/03/2020.  Urine drug screen positive cocaine and THC.  She was found to have electrolyte abnormality with potassium 3.4, Mg 1.5 and CK-375.  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 MRI C spine showed large central disc protrusion at C4-5 with severe spinal stenosis and compressive myelopathy as well as severe right C4/C5 foraminal stenosis. Right knee contusion noted and X  rays negative for fracture.    Neurosurgery evaluated patient with exam revealing sensory deficits bilateral hands below forearm and bilateral lower extremities as well as 1/5 strength bilateral grip/wrist extension and 4/5 left biceps/triceps/iliopsoas/quads. MAPs. >80s recommended and she was taken to OR on 07/04/2020 for ACDF of C4/C5  by  Dr.  Izora Ribas.  Hospital course further complicated by resulting post op diffuse weakness a s well as difficulty with swallowing.  Barium swallow done 02/11 due to difficulty swallowing and was negative for mas, stricture or ulceration. She was evaluated by ST and started on dysphagia 2, thins recommended with assistance for feeding. Therapy ongoing and patient limited by BLE/BUE weakness with difficulty standing and staggering gait, is unable to feed herself or perform ADLs. CIR recommended due to functional decline.  Please see preadmission assessment from earlier today as well..  Patient transferred to CIR on 07/11/2020 .   Patient currently requires max with mobility secondary to muscle weakness, decreased cardiorespiratoy endurance, unbalanced muscle activation, motor apraxia and decreased coordination and decreased sitting balance, decreased standing balance, decreased postural control, decreased balance strategies and difficulty maintaining precautions.  Prior to hospitalization, patient was max with mobility and lived with Alone in a [place] home.  Home access is 5Stairs to enter.  Patient will benefit from skilled PT intervention to maximize safe functional mobility, minimize fall risk and decrease caregiver burden for planned discharge home with 24 hour assist.  Anticipate patient will benefit from follow up [facility] at discharge.  PT - End of Session Activity Tolerance: Tolerates 30+ min activity with multiple rests Endurance Deficit: Yes PT Assessment Rehab Potential (ACUTE/IP ONLY): Good PT Barriers to Discharge: [place] home environment;Decreased  caregiver  support;Behavior;Incontinence;Nutrition means PT Patient demonstrates impairments in the following area(s): Motor;Balance;Nutrition;Safety;Sensory;Pain;Skin Integrity;Endurance PT Transfers Functional Problem(s): Bed Mobility;Bed to Chair;Car;Furniture PT Locomotion Functional Problem(s): Ambulation;Stairs;Wheelchair Mobility PT Plan PT Intensity: Minimum of 1-2 x/day ,45 to 90 minutes PT Frequency: 5 out of 7 days PT Duration Estimated Length of Stay: 18-21 days PT Treatment/Interventions: Ambulation/gait training;Balance/vestibular training;Cognitive remediation/compensation;Community reintegration;Discharge planning;Disease management/prevention;DME/adaptive equipment instruction;Functional electrical stimulation;Functional mobility training;Pain management;Patient/family education;Psychosocial support;Skin care/wound management;Splinting/orthotics;Stair training;Therapeutic Activities;Therapeutic Exercise;UE/LE Strength taining/ROM;Neuromuscular re-education;UE/LE Coordination activities;Wheelchair propulsion/positioning PT Transfers Anticipated Outcome(s): min A PT Locomotion Anticipated Outcome(s): min A short distances with LRAD or WC level at supervision-mod I PT Recommendation Follow Up Recommendations: Home health PT Patient destination: Home Equipment Recommended: To be determined   PT Evaluation Precautions/Restrictions Precautions Precautions: Fall;Cervical Precaution Comments: s/p ACDF C4-5 (no cervical brace needed per acute), BUE/LE weakness Restrictions Weight Bearing Restrictions: No General   Vital SignsTherapy Vitals Temp: 98 F (36.7 C) Pulse Rate: 95 Resp: 19 BP: 128/75 Patient Position (if appropriate): Lying Oxygen Therapy SpO2: 97 % O2 Device: Room Air Pain Pain Assessment Pain Scale: 0-10 Pain Score: 5  (with standing) Pain Type: Acute pain Pain Location: Foot Pain Orientation: Right;Left Pain Descriptors / Indicators: Constant (with WB  per pt) Home Living/Prior Functioning Home Living Available Help at Discharge: Family;Available 24 hours/day (pt reports her boyfriend can assist and is avaible AAT) Type of Home: Apartment Home Access: Stairs to enter Entrance Stairs-Number of Steps: 5 Entrance Stairs-Rails: Right;Left (cannot reach both) Home Layout: One level Bathroom Shower/Tub: Multimedia programmer: Standard Bathroom Accessibility: Yes Additional Comments: Pt reports living in studio apt/duplex. 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) Comments: prior to fall Christmas 2021 [place] with self care, working at group home Vision/Perception  Perception Perception: Within [place]: Impaired  Cognition Overall Cognitive Status: Within Functional Limits for tasks assessed Arousal/Alertness: Awake/alert Orientation Level: Oriented X4 Attention: Focused;Sustained Focused Attention: Appears intact Sustained Attention: Appears intact Memory: Appears intact Immediate Memory Recall: Sock;Blue;Bed Memory Recall Sock: Without Cue Memory Recall Blue: Without Cue Memory Recall Bed: Not able to recall Awareness: Appears intact Executive Function: Reasoning Reasoning: Appears intact Safety/Judgment: Appears intact Sensation Sensation Light Touch: Impaired by gross assessment Peripheral sensation comments: reports feeling "fuzzy", decreased sensation in BLEs, decreased in [place] (mostly hands) as well Light Touch Impaired Details: Impaired RUE;Impaired LUE;Impaired RLE;Impaired LLE Coordination Gross Motor Movements are Fluid and Coordinated: No Fine Motor Movements are Fluid and Coordinated: No Finger Nose  Finger Test: limited grossly d/t weakness (more weakness distal > proximal) Motor  Motor Motor: Abnormal postural alignment and control;Tetraplegia Motor - Skilled Clinical Observations: decreased GM/FMC in BUEs, weakness in BLEs as well   Trunk/Postural Assessment  Cervical Assessment Cervical Assessment: Exceptions to [facility] (s/p ACDF and cervical precautions) Thoracic Assessment Thoracic Assessment: Exceptions to [facility] (mildly kyphotic) Lumbar Assessment Lumbar Assessment: Exceptions to [facility] (posterior pelvic tilt) Postural Control Postural Control: Deficits on evaluation  Balance Balance Balance Assessed: Yes Static Sitting Balance Static Sitting - Balance Support: Feet supported;Left upper extremity supported Static Sitting - Level of Assistance: 5: Stand by assistance Dynamic Sitting Balance Dynamic Sitting - Balance Support: Left upper extremity supported;Feet supported Dynamic Sitting - Level of Assistance: 4: Min assist Dynamic Sitting - Balance Activities: Forward lean/weight shifting;Reaching for  objects;Lateral lean/weight shifting Static Standing Balance Static Standing - Balance Support: During functional activity;Bilateral upper extremity supported Static Standing - Level of Assistance: 2: Max assist Dynamic Standing Balance Dynamic Standing - Balance Support: Bilateral upper extremity supported;During functional activity Dynamic Standing - Level of Assistance: Not tested (comment) (pt unable to tolerate standing on B feet long enough to attempt 2/2 pain) Extremity Assessment  RUE Assessment RUE Assessment: Exceptions to [facility] Active Range of Motion (AROM) Comments: approx 100* shoulder flexion/abduction, WFL for ROM elbow/wrist, limited at digits able to make gross grasp General Strength Comments: roughly 4/5 shoulders and bicep, 3+/5 tricep, roughly 2/5 grip LUE Assessment LUE Assessment: Exceptions to [facility] Active Range of Motion (AROM) Comments: approx 100* shoulder  flexion/abduction, WFL for ROM elbow/wrist, limited at digits able to make gross grasp General Strength Comments: roughly 4/5 shoulders and bicep, 3+/5 tricep, roughly 2/5 grip RLE Assessment RLE Assessment: Exceptions to [facility] RLE Strength Right Hip Flexion: 2/5 Right Hip ABduction: 2+/5 Right Hip ADduction: 2+/5 Right Knee Flexion: 2+/5 Right Knee Extension: 2/5 Right Ankle Dorsiflexion: 2-/5 Right Ankle Plantar Flexion: 2/5 LLE Assessment LLE Assessment: Exceptions to [facility] LLE Strength Left Hip Flexion: 2/5 Left Hip ABduction: 2+/5 Left Hip ADduction: 2+/5 Left Knee Flexion: 2+/5 Left Knee Extension: 2/5 Left Ankle Dorsiflexion: 2-/5 Left Ankle Plantar Flexion: 2/5  Care Tool Care Tool Bed Mobility Roll left and right activity   Roll left and right assist level: Moderate Assistance - Patient 50 - 74%    Sit to lying activity   Sit to lying assist level: Maximal Assistance - Patient 25 - 49%    Lying to sitting edge of bed activity   Lying to sitting edge of bed assist level: Maximal Assistance - Patient 25 - 49%     Care Tool Transfers Sit to stand transfer   Sit to stand assist level: Maximal Assistance - Patient 25 - 49%    Chair/bed transfer   Chair/bed transfer assist level: Maximal Assistance - Patient 25 - 49%     Toilet transfer   Assist Level: Maximal Assistance - Patient 24 - 49%    Car transfer Car transfer activity did not occur: Safety/medical concerns        Care Tool Locomotion Ambulation Ambulation activity did not occur: Safety/medical concerns        Walk 10 feet activity Walk 10 feet activity did not occur: Safety/medical concerns       Walk 50 feet with 2 turns activity Walk 50 feet with 2 turns activity did not occur: Safety/medical concerns      Walk 150 feet activity Walk 150 feet activity did not occur: Safety/medical concerns      Walk 10 feet on uneven surfaces activity Walk 10 feet on uneven surfaces activity did not occur:  Safety/medical concerns      Stairs Stair activity did not occur: Safety/medical concerns        Walk up/down 1 step activity Walk up/down 1 step or curb (drop down) activity did not occur: Safety/medical concerns     Walk up/down 4 steps activity did not occuR: Safety/medical concerns  Walk up/down 4 steps activity      Walk up/down 12 steps activity Walk up/down 12 steps activity did not occur: Safety/medical concerns      Pick up small objects from floor Pick up small object from the floor (from standing position) activity did not occur: Safety/medical concerns      Wheelchair Will patient use wheelchair at discharge?: Yes  Type of Wheelchair: Manual   Wheelchair assist level: Moderate Assistance - Patient 50 - 74% Max wheelchair distance: 150  Wheel 50 feet with 2 turns activity   Assist Level: Minimal Assistance - Patient > 75%  Wheel 150 feet activity   Assist Level: Maximal Assistance - Patient 25 - 49%    Refer to Care Plan for Long Term Goals  SHORT TERM GOAL WEEK 1 PT Short Term Goal 1 (Week 1): pt to demonstrate supine<>sit mod A PT Short Term Goal 2 (Week 1): pt to demonstrate functional transfer with LRAD at mod A PT Short Term Goal 3 (Week 1): pt to demonstrate gait training with LRAD for 20' PT Short Term Goal 4 (Week 1): pt to demonstrate standing balance at mod A for at least 3 mins  Recommendations for other services: None   Skilled Therapeutic Intervention   Evaluation completed (see details above and below) with education on PT POC and goals and individual treatment initiated with focus on  Bed mobility, transfer training, sitting balance, safety awareness, call light use. pt received in [facility] and agreeable to therapy. Pt taken to gym total A for time and energy. Pt directed in transfer training in // at mod A-max A for x3 Sit to stand with B knee blocking and assist to ascend from [facility], pt unable to tolerate static standing or attempting to initiate gait  training 2/2 pain in B feet with weight bearing. Pt reported 5/10 pain and reported "unbearable" and very appologetic, with rest pt requested to try again for (2nd and 3rd reps) and similar results. Pt then removed from // total A, post rest break denied pain again and agreeable to continue therapy. Pt directed in [place] mobility for 150 total min A with very poor pace and technique noted for 15' then mod A for 75 additional feet, and ultimately max A for 50' at end range. Pt then directed in dynamic reaching from [facility] level in forward directions at [facility] with pt unable to reaching longer than 5in with min A and unable to laterally achieve reaching. Pt reported fatigue and requested rest break and then reported feeling dizzy since after getting flu shot this AM, nursing made aware. Pt taken to room total A for time and energy. BP taken to be 121/79. Pt then directed in squat pivot transfer to bed from WC max A and VC to increase participation. Pt then directed in max A sit>supine and mod A for repositioning at end of session in bed. Pt left in bed, alarm set, All needs in reach and in good condition. Call light in hand.  And reported slightly improved dizziness. Nursing made aware.   Mobility Bed Mobility Bed Mobility: Rolling Right;Rolling Left;Right Sidelying to Sit;Sit to Supine Rolling Right: Minimal Assistance - Patient > 75% Rolling Left: Minimal Assistance - Patient > 75% Right Sidelying to Sit: Maximal Assistance - Patient 25-49% Supine to Sit: Maximal Assistance - Patient - Patient 25-49% Sit to Supine: Maximal Assistance - Patient 25-49% Transfers Transfers: Sit to Stand;Stand to Sit;Stand Pivot Transfers Sit to Stand: Maximal Assistance - Patient 25-49% Stand to Sit: Moderate Assistance - Patient 50-74%;Maximal Assistance - Patient 25-49% Stand Pivot Transfers: Maximal Assistance - Patient 25 - 49% Stand Pivot Transfer Details: Verbal cues for technique;Verbal cues for safe use of DME/AE;Verbal  cues for sequencing;Verbal cues for precautions/safety Transfer (Assistive device): Rolling walker Locomotion  Gait Ambulation: No Gait Gait: No Gait velocity: decreased Stairs / Additional Locomotion Stairs: No Product manager  Mobility: Yes Wheelchair Assistance: Moderate Assistance - Patient 50 - 74% Wheelchair Propulsion: Both upper extremities Wheelchair Parts Management: Needs assistance Distance: 150'   Discharge Criteria: Patient will be discharged from PT if patient refuses treatment 3 consecutive times without medical reason, if treatment goals not met, if there is a change in medical status, if patient makes no progress towards goals or if patient is discharged from hospital.  The above assessment, treatment plan, treatment alternatives and goals were discussed and mutually agreed upon: by patient  Junie Panning 07/12/2020, 3:38 PM

## 2020-07-13 ENCOUNTER — Inpatient Hospital Stay (HOSPITAL_COMMUNITY): Payer: Medicaid Other

## 2020-07-13 NOTE — Progress Notes (Signed)
Bowel program started at 1820 with suppository. Rectum with soft mushy stool. Assisted pt toilet with 2+stedy at 1840. Pt able to have continent medium soft formed stool. Pt had a smear of incontinence during transfers, otherwise, tolerated well. Continue plan of care.   Marylu Lund, RN

## 2020-07-13 NOTE — Progress Notes (Signed)
Modified Barium Swallow Progress Note  Patient Details  Name: Yvette Phillips MRN: [id number] Date of Birth: Mar 27, 1961  Today's Date: 07/13/2020  Modified Barium Swallow completed.  Full report located under Chart Review in the Imaging Section.  Brief recommendations include the following:  Clinical Impression  Patient presents with a mild oral and mild-moderate pharyngeal dysphagia. Oral phase was impacted by patient's missing and poor condition of dentition resulting in delayed mastication and oral transit of solid boluses. She exhibited swallow initiation delays to pyriform sinuses with thin liquids and vallecular sinus with puree and regular solids. Barium tablet became briefly lodged in vallecular sinus and required several additional sips of liquids and puree solids before eventually transited. Patient did exhibit instance of sensed aspiration of trace amount which occured during the swallow with thin liquids. Patient was able to transit majority of aspirate back out into pharynx. She exhibited moderate vallecular residuals with puree solids and regular solids and mild vallecular residuals with thin liquids, trace to mild pyriform sinus residuals. Although no radiologist present to confirm, it appeared that patient had some edema in pharynx at location of ACDF hardware, which resulted in decreased esophageal opening and transit of boluses. Chin tuck posture and 2-3 dry swallows helped clear pharyngeal residuals.   Swallow Evaluation Recommendations       SLP Diet Recommendations: Thin liquid;Dysphagia 2 (Fine chop) solids   Liquid Administration via: Cup;Straw   Medication Administration: Crushed with puree   Supervision: Patient able to self feed   Compensations: Slow rate;Small sips/bites;Follow solids with liquid;Other (Comment) (chin tuck to clear pharyngeal residuals, 2-3 times extra dry swallows)   Postural Changes: Seated upright at 90 degrees;Remain semi-upright after after  feeds/meals (Comment) (20 minutes)   Oral Care Recommendations: Oral care BID      Angela Nevin, MA, CCC-SLP Speech Therapy

## 2020-07-13 NOTE — Progress Notes (Addendum)
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 No pertinent past medical history. Past Surgical History:  Past Surgical History:  Procedure Laterality Date  . ANTERIOR CERVICAL DECOMP/DISCECTOMY FUSION N/A 07/04/2020   Procedure: ANTERIOR CERVICAL DECOMPRESSION/DISCECTOMY FUSION 1 LEVEL;  Surgeon: Meade Maw, MD;  Location: [facility] ORS;  Service: Neurosurgery;  Laterality: N/A;   Social History:  reports that she has been smoking. She has a 7.50 pack-year smoking history. She has never used smokeless tobacco. She reports current alcohol use. She reports previous drug use.  Family / Support Systems Patient Roles: Partner Spouse/Significant Other: Christia Reading (boyfriend) Children: No children Other Supports: Siblings- wellness check ins only Anticipated Caregiver: Christia Reading- boyfriend Ability/Limitations of Caregiver: None reported Caregiver Availability: Intermittent Family Dynamics: Pt lives alone.  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 Education: high school Read: Yes Write: Yes Employment Status: Employed Return to Work Plans: Pt would like to return to work when able too Public relations account executive Issues: Denies Guardian/Conservator: N/A   Abuse/Neglect Abuse/Neglect Assessment Can Be Completed: Yes Physical Abuse: Denies Verbal Abuse: Denies Sexual Abuse: Denies Exploitation of patient/patient's resources: Denies Self-Neglect: Denies  Emotional Status Pt's affect, behavior and adjustment status: Pt in good spirits at time of visit Recent Psychosocial Issues: Denies Psychiatric History:  N/A Substance Abuse History: Pt admits to smoking 3-4 cigarettes per day;  using crack 3 months ago. States she has used off and on, but realized she needed to "get herself together;" marijuana use once every 2 weeks and; eoth use every weekend- beer, 1/2 of a 40z and wine atleast 2 glasses.  Patient / Family Perceptions, Expectations & Goals Pt/Family understanding of illness & functional limitations: Pt has general understanding of care needs Premorbid pt/family roles/activities: some assistance with ADLs/IADLs Anticipated changes in roles/activities/participation: Assistance with ADLs/IADLs Pt/family expectations/goals: pt goal is to get hand feet moving; be more independent.  Community Resources [employer]: None Premorbid Home Care/DME Agencies: None Transportation available at discharge: TBD Resource referrals recommended: Neuropsychology  Discharge Planning Living Arrangements: Alone Support Systems: Spouse/significant other,Other relatives,Friends/neighbors Type of Residence: Private residence [employer] Resources: Teacher, adult education Resources: Employment Financial Screen Referred: No (referral made to [place]: Rent Money Management: Patient Does the patient have any problems obtaining your medications?: No Home Management: Pt managed all homecare needs Patient/Family Preliminary Plans: TBD Care Coordinator Barriers to Discharge: Decreased caregiver support,Lack of/limited family support,Other (comments) Care Coordinator Barriers to Discharge Comments: Pt is uninsured Care Coordinator Anticipated Follow Up Needs: HH/OP Expected length of stay: 18-21 days  Clinical Impression SW met with pt in room to introduce self, explain role, and discuss discharge process. Pt is uninsured. Referral made to [facility]. Pt is not a veteran. No HCPOA paperwork. Pt interested in completing. Pt states HCPOA would be her brother Herbie [place] [phone]). No DME. Pt aware SW  to follow-up with her brother. SW informed pt assigned RN about chaplain consult for HCPOA.  Rana Snare 07/13/2020, 12:32 PM

## 2020-07-13 NOTE — Progress Notes (Signed)
Physical Therapy Session Note  Patient Details  Name: Yvette Phillips MRN: [id number] Date of Birth: Nov 24, 1960  Today's Date: 07/13/2020 PT Individual Time: 1130-1157 PT Individual Time Calculation (min): 27 min   Short Term Goals: Week 1:  PT Short Term Goal 1 (Week 1): pt to demonstrate supine<>sit mod A PT Short Term Goal 2 (Week 1): pt to demonstrate functional transfer with LRAD at mod A PT Short Term Goal 3 (Week 1): pt to demonstrate gait training with LRAD for 20' PT Short Term Goal 4 (Week 1): pt to demonstrate standing balance at mod A for at least 3 mins  Skilled Therapeutic Interventions/Progress Updates:    Pt greeted sitting upright in w/c, agreeable to therapy. Reports she has been working on [facility] with theraputty and stress ball. W/c transport for time management to main rehab gym. Performed squat<>pivot with maxA from w/c to mat table, cues for forward weight shift and general sequencing but required totalA for monitoring bilateral foot placement. Able to sit EOM with close supervision while unsupported. Performed the following sitting tasks for purposes of challenging functional sitting balance and core facilitation: -1x15 lateral elbow leans bilaterally, with minA due to posterior bias -1x20 perturbations in all directions (increased difficulty with posterior and lateral perturbations) -1x20 forward and lateral targeted reaching, bilaterally  Squat<>pivot with maxA from mat table back to her w/c, again requiring cues for head/hip, forward weight shift, and hand placement. She was able to reposition herself in chair with minA for posterior scooting. Pt returned to her room with totalA in wc and she remained seated with chair alarm on and her needs within reach. Pt thankful and appreciative of therapeutic intervention.  Therapy Documentation Precautions:  Precautions Precautions: Fall,Cervical Precaution Comments: s/p ACDF C4-5 (no cervical brace needed per acute), BUE/LE  weakness Restrictions Weight Bearing Restrictions: No  Therapy/Group: Individual Therapy  Dinari Stgermaine P Pryce Folts PT 07/13/2020, 7:30 AM

## 2020-07-13 NOTE — Progress Notes (Signed)
Pt had 2 medium sized bm ( brown, soft) and 3 small BM (brown, soft) throughout night. Pt stable at this time. RN will give report to oncoming shift.

## 2020-07-13 NOTE — IPOC Note (Signed)
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, subsequent encounter [facility])     Functional Problem List: Nursing Behavior,Bladder,Bowel,Endurance,Medication Management,Pain,Perception,Safety,Skin Integrity  PT Motor,Balance,Nutrition,Safety,Sensory,Pain,Skin Integrity,Endurance  OT Balance,Endurance,Motor,Safety,Sensory,Skin Integrity  SLP    TR         Basic ADL's: OT Eating,Grooming,Bathing,Dressing,Toileting     Advanced  ADL's: OT       Transfers: PT Bed Mobility,Bed to Chair,Car,Furniture  OT Toilet,Tub/Shower     Locomotion: PT Ambulation,Stairs,Wheelchair Mobility     Additional Impairments: OT None  SLP Swallowing      TR      Anticipated Outcomes Item Anticipated Outcome  Self Feeding Mod I  Swallowing  mod I   Basic self-care  Min A  Toileting  Min A   Bathroom Transfers CGA  Bowel/Bladder  Mod I  Transfers  min A  Locomotion  min A short distances with LRAD or WC level at supervision-mod I  Communication     Cognition     Pain  <3  Safety/Judgment  Mod I   Therapy Plan: PT Intensity: Minimum of 1-2 x/day ,45 to 90 minutes PT Frequency: 5 out of 7 days PT Duration Estimated Length of Stay: 18-21 days OT Intensity: Minimum of 1-2 x/day, 45 to 90 minutes OT Frequency: 5 out of 7 days OT Duration/Estimated Length of Stay: 18-21 days SLP Intensity:  (TBD pending MBS results) SLP Duration/Estimated Length of Stay: TBD for ST pending MBS results   Due to the current state of emergency, patients may not be receiving their 3-hours of Medicare-mandated therapy.   Team Interventions: Nursing Interventions Patient/Family Education,Bladder Management,Bowel Management,Disease  Management/Prevention,Pain Management,Medication Management,Skin Care/Wound Engineer, production Support  PT interventions Ambulation/gait training,Balance/vestibular training,Cognitive remediation/compensation,Community reintegration,Discharge planning,Disease Engineer, production stimulation,Functional mobility training,Pain management,Patient/family education,Psychosocial support,Skin care/wound management,Splinting/orthotics,Stair training,Therapeutic Activities,Therapeutic Exercise,UE/LE Strength taining/ROM,Neuromuscular re-education,UE/LE Psychiatrist propulsion/positioning  OT Interventions Balance/vestibular training,Discharge planning,Functional electrical stimulation,Self Care/advanced ADL retraining,Therapeutic Activities,UE/LE Coordination activities,Cognitive remediation/compensation,Disease mangement/prevention,Functional mobility training,Patient/family education,Skin care/wound managment,Therapeutic Exercise,Visual/perceptual remediation/compensation,Wheelchair propulsion/positioning,UE/LE Strength taining/ROM,Splinting/orthotics,Psychosocial support,Neuromuscular re-education,DME/adaptive equipment instruction,Community reintegration  SLP Interventions Dysphagia/aspiration precaution training,Patient/family education  TR Interventions    SW/CM Interventions Discharge Planning,Patient/Family Education,Psychosocial Support   Barriers to Discharge MD  Medical stability, Home enviroment access/loayout, Incontinence, Neurogenic bowel and bladder, Wound care, Weight bearing restrictions and SCI- central cord syndrome  Nursing Inaccessible home environment,Decreased caregiver support,Home environment access/layout,Neurogenic Bowel & Bladder,Incontinence,Lack of/limited family support,Weight bearing restrictions,Medication compliance,Behavior,Nutrition  means    PT Inaccessible home environment,Decreased caregiver support,Behavior,Incontinence,Nutrition means    OT Decreased caregiver support    SLP      SW Decreased caregiver support,Lack of/limited family support,Other (comments) Pt is uninsured   Team Discharge Planning: Destination: PT-Home ,OT- Home , SLP-Home Projected Follow-up: PT-Home health PT, OT-  Home health OT, SLP-None Projected Equipment Needs: PT-To be determined, OT- To be determined, SLP-None recommended by SLP Equipment Details: PT- , OT-  Patient/family involved in discharge planning: PT- Patient,  OT-Patient, SLP-Patient  MD ELOS: 18-21 days Medical Rehab Prognosis:  Good Assessment: Pt is a 60 yr old female with hx of polysubstance abuse, with new central cord syndrome from fall, ABLA, hypokalemia/low Mg, neurogenic bowel and bladder, spasticity already and neuropathic pain-  Goals Mod I to min A by d/c.      See Team  Conference Notes for weekly updates to the plan of care

## 2020-07-13 NOTE — Progress Notes (Signed)
PROGRESS NOTE   Subjective/Complaints:   Pt reports spasms doing better with new medicine.  Had 5-7 soft, not liquid BMs las tnight- sounds like got cleaned after during and after bowel program- KUB showed was full of stool.  Vomited x1- but feeling much better this AM.  Was asked about pt possibly having a STD_ per pt, did not have one.  Nerve pain slightly better   ROS:  Pt denies SOB, abd pain, CP, N/V/C/D, and vision changes   Objective:   DG Abd 1 View  Result Date: 07/11/2020 CLINICAL DATA:  Constipation. EXAM: ABDOMEN - 1 VIEW COMPARISON:  No recent. FINDINGS: Soft tissue structures are unremarkable. Oral contrast noted throughout the colon and rectum. Moderate stool volume right colon. No bowel distention. No free air. Degenerative change lumbar spine. Small well-circumscribed sclerotic density right ilium, most likely bone island. Pelvic calcifications consistent phleboliths. IMPRESSION: Oral contrast noted throughout the colon and rectum. Moderate stool volume right colon. No bowel distention. Electronically Signed   By: Maisie Fus  Register   On: 07/11/2020 16:53   Recent Labs    07/11/20 0451 07/12/20 0513  WBC 6.2 6.8  HGB 12.6 13.4  HCT 38.4 40.4  PLT 331 346   Recent Labs    07/12/20 0513  NA 137  K 4.1  CL 101  CO2 27  GLUCOSE 91  BUN 9  CREATININE 0.82  CALCIUM 9.5    Intake/Output Summary (Last 24 hours) at 07/13/2020 0933 Last data filed at 07/13/2020 0829 Gross per 24 hour  Intake 580 ml  Output --  Net 580 ml        Physical Exam: Vital Signs Blood pressure 102/62, pulse 86, temperature 97.6 F (36.4 C), temperature source Oral, resp. rate 19, height 5\' 7"  (1.702 m), weight 81.4 kg, SpO2 100 %.  Constitutional: sitting up in bed; appropriate, NAD HENT: smile equal; conjugate gaze  Cardiovascular: RRR- no JVD  Pulmonary: CTA B/L- no W/R/R- good air movement Abdominal: soft, flatter  abd after 5+ BMs last night- NT, normoactive BS Musculoskeletal:     Cervical back: Normal range of motion and neck supple.     Comments: Left knee pain with flexion.  Left knee with tenderness and mild edema   Skin:    General: Skin is warm and dry.     Comments: Tinea pedis bilaterally with cracked right heel and thick toe nails.    Neurological: Ox3   MAS of 1 to 1+ in UEs/LEs with Hoffman's brisk (+) in UEs- no change Motor: Bilateral upper extremities: Shoulder abduction, elbow flexion/extension 4/5, wrist extension 3+/5, and intrinsics 2/5 Right lower extremity: Hip flexion, knee extension 2+/5, ankle dorsiflexion 4/5 Left lower extremity: Hip flexion, knee extension 3 -/5, ankle dorsiflexion 4/5 Sensation to light touch diminished distal to elbows and knees  Psychiatric:    appropriate   Assessment/Plan: 1. Functional deficits which require 3+ hours per day of interdisciplinary therapy in a comprehensive inpatient rehab setting.  Physiatrist is providing close team supervision and 24 hour management of active medical problems listed below.  Physiatrist and rehab team continue to assess barriers to discharge/monitor patient progress toward functional and medical goals  Care Tool:  Bathing    Body parts bathed by patient: Right arm,Left arm,Chest,Abdomen,Front perineal area,Right upper leg,Left upper leg,Face   Body parts bathed by helper: Buttocks,Right lower leg,Left lower leg,Front perineal area     Bathing assist Assist Level: Maximal Assistance - Patient 24 - 49%     Upper Body Dressing/Undressing Upper body dressing   What is the patient wearing?: Pull over shirt    Upper body assist Assist Level: Moderate Assistance - Patient 50 - 74%    Lower Body Dressing/Undressing Lower body dressing      What is the patient wearing?: Incontinence brief,Pants     Lower body assist Assist for lower body dressing: Total Assistance - Patient < 25%      Toileting Toileting    Toileting assist Assist for toileting: Total Assistance - Patient < 25%     Transfers Chair/bed transfer  Transfers assist     Chair/bed transfer assist level: Maximal Assistance - Patient 25 - 49%     Locomotion Ambulation   Ambulation assist   Ambulation activity did not occur: Safety/medical concerns          Walk 10 feet activity   Assist  Walk 10 feet activity did not occur: Safety/medical concerns        Walk 50 feet activity   Assist Walk 50 feet with 2 turns activity did not occur: Safety/medical concerns         Walk 150 feet activity   Assist Walk 150 feet activity did not occur: Safety/medical concerns         Walk 10 feet on uneven surface  activity   Assist Walk 10 feet on uneven surfaces activity did not occur: Safety/medical concerns         Wheelchair     Assist Will patient use wheelchair at discharge?: Yes Type of Wheelchair: Manual    Wheelchair assist level: Moderate Assistance - Patient 50 - 74% Max wheelchair distance: 150    Wheelchair 50 feet with 2 turns activity    Assist        Assist Level: Minimal Assistance - Patient > 75%   Wheelchair 150 feet activity     Assist      Assist Level: Maximal Assistance - Patient 25 - 49%   Blood pressure 102/62, pulse 86, temperature 97.6 F (36.4 C), temperature source Oral, resp. rate 19, height 5\' 7"  (1.702 m), weight 81.4 kg, SpO2 100 %.  Medical Problem List and Plan: 1.  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             -patient may shower             -ELOS/Goals: 14 to 17 days/supervision/min a             Admit to CIR 2.  Antithrombotics:  -DVT/anticoagulation:  Pharmaceutical: Lovenox  2/17- Very high risk of DVT due to SCI-will need 2-3 months of Lovenox per CHEST guidelines.              -antiplatelet  therapy: N/A 3. Pain Management: Oxycodone prn.              Monitor with increased exertion, particularly neuropathic pain 4. Mood: LCSW to follow for evaluation and support.              -antipsychotic agents: N/A 5. Neuropsych: This patient is capable of making decisions on her own behalf.  6. Skin/Wound Care:  Monitor incision for healing.  Added protein supplement to help promote healing.  7. Fluids/Electrolytes/Nutrition: Monitor I/Os.              CMP ordered for tomorrow 8. ABLA:              CBC ordered for tomorrow 9. Hypokalemia/Hypomagnesemia: Has resolved with brief supplementation.              Labs ordered for tomorrow 10. Macrocytosis/Polysubstance abuse:              Vitamin B12/folate levels ordered.   217- counseling given about polysubstance abuse 11. Neurogenic bowel and constipation: KUB ordered to determine stool burden.  2/17- moderate R sided stool burden with a lot of contrast left in colon- will order bowel program with dig stim nightly and con't Miralax BID for now- she has no control of stool per pt.   2/18- had 5+ BMs last night- feels cleaned out- now- kept saying had more in her- but now feels "great'.  12. Neurogenic bladder  2/17- PVRs 0-100 cc so far- not on Flomax- con't regimen for now- has no control of bladder, of note.  13. Neuropathic pain  2/17- start Lyrica 50 mg TID for nerve pain- hopefully will help "being on fire".   2/18- slightly better today- con't regimen 14. Spasticity  2/17- will start Baclofen 5 mg QID for muscle tightness/spasms.     2/18- spasms much better- con't regimen  LOS: 2 days A FACE TO FACE EVALUATION WAS PERFORMED  Yvette Phillips 07/13/2020, 9:33 AM

## 2020-07-13 NOTE — Progress Notes (Signed)
Patient ID: Yvette Phillips, female   DOB: 13-Feb-1961, 60 y.o.   MRN: [id number]  SW made efforts to contact [name] at [phone] but number does not appear to be working. SW used number in [place] [phone] and left message.   SW made contact with [name] [phone]) to introduce self, explain role, and discuss discharge process. She reports she is the only local sibling and helps pt as best as she can. SW informed on ELOS 18-21 days, and will follow-up after team conference next week with more updates.   Cecile Sheerer, MSW, LCSWA Office: [phone] Cell: [phone] Fax: [phone]

## 2020-07-13 NOTE — Care Management (Signed)
Inpatient Rehabilitation Center Individual Statement of Services  Patient Name:  Mariaclara Spear  Date:  07/13/2020  Welcome to the Inpatient Rehabilitation Center.  Our goal is to provide you with an individualized program based on your diagnosis and situation, designed to meet your specific needs.  With this comprehensive rehabilitation program, you will be expected to participate in at least 3 hours of rehabilitation therapies Monday-Friday, with modified therapy programming on the weekends.  Your rehabilitation program will include the following services:  Physical Therapy (PT), Occupational Therapy (OT), Speech Therapy (ST), 24 hour per day rehabilitation nursing, Therapeutic Recreaction (TR), Psychology, Neuropsychology, Care Coordinator, Rehabilitation Medicine, Nutrition Services, Pharmacy Services and Other  Weekly team conferences will be held on Tuesdays to discuss your progress.  Your Inpatient Rehabilitation Care Coordinator will talk with you frequently to get your input and to update you on team discussions.  Team conferences with you and your family in attendance may also be held.  Expected length of stay: 18-21 days  Overall anticipated outcome: Minimal Assistance  Depending on your progress and recovery, your program may change. Your Inpatient Rehabilitation Care Coordinator will coordinate services and will keep you informed of any changes. Your Inpatient Rehabilitation Care Coordinator's name and contact numbers are listed  below.  The following services may also be recommended but are not provided by the Inpatient Rehabilitation Center:   Driving Evaluations  Home Health Rehabiltiation Services  Outpatient Rehabilitation Services  Vocational Rehabilitation   Arrangements will be made to provide these services after discharge if needed.  Arrangements include referral to agencies that provide these services.  Your insurance has been verified to be:  Uninsured  Your  primary doctor is:  No PCP  Pertinent information will be shared with your doctor and your insurance company.  Inpatient Rehabilitation Care Coordinator:  Susie Cassette [phone] or ([phone]  Information discussed with and copy given to patient by: Gretchen Short, 07/13/2020, 12:31 PM

## 2020-07-13 NOTE — Progress Notes (Signed)
Patient requested AD. Chaplain took info to patient. She will consult with her family and let Chaplain know when she is ready to complete.  07/13/20 1600  Clinical Encounter Type  Visited With Patient  Visit Type Initial  Referral From Nurse  Consult/Referral To Chaplain

## 2020-07-13 NOTE — Progress Notes (Signed)
Occupational Therapy Session Note  Patient Details  Name: Yvette Phillips MRN: [id number] Date of Birth: May 15, 1961  Today's Date: 07/13/2020 OT Individual Time: 1015-1130 OT Individual Time Calculation (min): 75 min    Short Term Goals: Week 1:  OT Short Term Goal 1 (Week 1): Pt will perform self feeding with AE with Supervision OT Short Term Goal 2 (Week 1): Pt will perform sit > stand at AD with no more than Mod A in prep for LB ADL OT Short Term Goal 3 (Week 1): Pt will perform BSC/toilet transfers with no more than Mod of 1 OT Short Term Goal 4 (Week 1): Pt will increase functional grip strength to perform oral hygiene w/ Supervision  Skilled Therapeutic Interventions/Progress Updates:    Pt resting in bed upon arrival and agreeable to getting OOB for bathing/dressing. Supine>sit EOB with mod A for assistance with LLE and pushing up from sidelying. Squat pivot tranfser to w/c with max A. UB bathing at sink with supervision. UB dressing with min A. LB dressing with +2 while standing. Pt dropped wash cloth x 3. Pt with difficulty managing pullover shirt. Pt transitioned to day room. Pt issued super soft putty. Pt practiced forming into a ball and rolling out into a "snake." Pt practiced pincher grip with RUE and LUE. Pt returned to room and remained in w/c. All needs within reach and seat alarm activated.  Therapy Documentation Precautions:  Precautions Precautions: Fall,Cervical Precaution Comments: s/p ACDF C4-5 (no cervical brace needed per acute), BUE/LE weakness Restrictions Weight Bearing Restrictions: No   Pain:  Pt denies pain this morning     Other Treatments:     Therapy/Group: Individual Therapy  [name] 07/13/2020, 11:32 AM

## 2020-07-13 NOTE — Progress Notes (Signed)
Physical Therapy Session Note  Patient Details  Name: Yvette Phillips MRN: [id number] Date of Birth: [birth year]  Today's Date: 07/13/2020 PT Individual Time: 1400-1455 PT Individual Time Calculation (min): 55 min   Short Term Goals: Week 1:  PT Short Term Goal 1 (Week 1): pt to demonstrate supine<>sit mod A PT Short Term Goal 2 (Week 1): pt to demonstrate functional transfer with LRAD at mod A PT Short Term Goal 3 (Week 1): pt to demonstrate gait training with LRAD for 20' PT Short Term Goal 4 (Week 1): pt to demonstrate standing balance at mod A for at least 3 mins  Skilled Therapeutic Interventions/Progress Updates: Pt presented in w/c agreeable to therapy. Pt states some generalized pain but premedicated. Pt transported to rehab gym total A for energy conservation. Pt participated in STS x 3 from parallel bars requiring modA to STS with heavy use of BUE. Pt was able to tolerate standing for 30 sec bouts with PTA providing facilitation at hips and cues for improved erect posture. On last bout pt was able to perform alternating TKE with weight shifting x 10. Once completed PTA applied theraband to w/c rims and pt was able to propel w/c for ~140f with supervision and verbal cues for increased extension on :UE for more efficient stroke. Pt transported back to room remaining distance and initially anticiapted transfer back to bed but pt's social worker present. PTA provided info on pt's current functional status and provided info from white board for [name] pt's LSW here for updates. Pt left in w/c with seat alarm on, call bell within reach and needs met.      Therapy Documentation Precautions:  Precautions Precautions: Fall,Cervical Precaution Comments: s/p ACDF C4-5 (no cervical brace needed per acute), BUE/LE weakness Restrictions Weight Bearing Restrictions: No General:   Vital Signs:   Pain: Pain Assessment Pain Scale: 0-10 Pain Score: 4  Faces Pain Scale: Hurts a little bit Pain  Type: Acute pain Pain Location: Neck Pain Orientation: Posterior Pain Descriptors / Indicators: Aching Pain Frequency: Intermittent Pain Onset: With Activity Pain Intervention(s): Medication (See eMAR) (tylenol given) Mobility:   Locomotion :    Trunk/Postural Assessment :    Balance:   Exercises:   Other Treatments:      Therapy/Group: Individual Therapy  Rosy Estabrook 07/13/2020, 3:52 PM

## 2020-07-14 ENCOUNTER — Inpatient Hospital Stay (HOSPITAL_COMMUNITY): Payer: Medicaid Other

## 2020-07-14 NOTE — Progress Notes (Signed)
PROGRESS NOTE   Subjective/Complaints: C/o of swelling near right elbow and it is evident- reports that this is new. Vascular US ordered to assess for clot She has no other complaints at this time   ROS:  Pt denies SOB, abd pain, CP, N/V/C/D, and vision changes, +swelling right elbow   Objective:   DG Swallowing Func-Speech Pathology  Result Date: 07/13/2020 Objective Swallowing Evaluation: Type of Study: Bedside Swallow Evaluation  Patient Details Name: Yvette BreslowMary Phillips MRN: [id number] Date of Birth: 1960-09-15 Today's Date: 07/13/2020 Time: SLP Start Time (ACUTE ONLY): 1135 -SLP Stop Time (ACUTE ONLY): 1150 SLP Time Calculation (min) (ACUTE ONLY): 15 min Past Medical History: No past medical history on file. Past Surgical History: Past Surgical History: Procedure Laterality Date . ANTERIOR CERVICAL DECOMP/DISCECTOMY FUSION N/A 07/04/2020  Procedure: ANTERIOR CERVICAL DECOMPRESSION/DISCECTOMY FUSION 1 LEVEL;  Surgeon: Venetia NightYarbrough, Chester, MD;  Location: [facility] ORS;  Service: Neurosurgery;  Laterality: N/A; HPI: 60yo female admitted 07/03/20 with increasing bilateral foot/leg pain, frequent falls. MRI: central cord syndrome, nicotine dependence, polysubstance abuse, alcohol abuse. MRI = large central disc protrusion at C4-C5, severe spinal canal stenosis, compressive myelopathy. ACDF 07/04/20. Intubated during surgery.  Subjective: Pt seated in recliner, awake, pleasant and cooperative. Assessment / Plan / Recommendation CHL IP CLINICAL IMPRESSIONS 07/13/2020 Clinical Impression Patient presents with a mild oral and mild-moderate pharyngeal dysphagia. Oral phase was impacted by patient's missing and poor condition of dentition resulting in delayed mastication and oral transit of solid boluses. She exhibited swallow initiation delays to pyriform sinuses with thin liquids and vallecular sinus with puree and regular solids. Barium tablet became briefly lodged in  vallecular sinus and required several additional sips of liquids and puree solids before eventually transited. Patient did exhibit instance of sensed aspiration of trace amount which occured during the swallow with thin liquids. Patient was able to transit majority of aspirate back out into pharynx. She exhibited moderate vallecular residuals with puree solids and regular solids and mild vallecular residuals with thin liquids, trace to mild pyriform sinus residuals. Although no radiologist present to confirm, it appeared that patient had some edema in pharynx at location of ACDF hardware, which resulted in decreased esophageal opening and transit of boluses. Chin tuck and 2-3 dry swallows helped clear pharyngeal residuals.  SLP Visit Diagnosis Dysphagia, oropharyngeal phase (R13.12) Attention and concentration deficit following -- Frontal lobe and executive function deficit following -- Impact on safety and function Mild aspiration risk   CHL IP TREATMENT RECOMMENDATION 07/13/2020 Treatment Recommendations Therapy as outlined in treatment plan below   Prognosis 07/06/2020 Prognosis for Safe Diet Advancement Fair Barriers to Reach Goals -- Barriers/Prognosis Comment -- CHL IP DIET RECOMMENDATION 07/13/2020 SLP Diet Recommendations Thin liquid;Dysphagia 2 (Fine chop) solids Liquid Administration via Cup;Straw Medication Administration Crushed with puree Compensations Slow rate;Small sips/bites;Follow solids with liquid;Other (Comment) Postural Changes Seated upright at 90 degrees;Remain semi-upright after after feeds/meals (Comment)   CHL IP OTHER RECOMMENDATIONS 07/13/2020 Recommended Consults -- Oral Care Recommendations Oral care BID Other Recommendations --   CHL IP FOLLOW UP RECOMMENDATIONS 07/07/2020 Follow up Recommendations Inpatient Rehab   CHL IP FREQUENCY AND DURATION 07/06/2020 Speech Therapy Frequency (ACUTE ONLY) min 1 x/week Treatment Duration  1 week;2 weeks      CHL IP ORAL PHASE 07/13/2020 Oral Phase  Impaired Oral - Pudding Teaspoon -- Oral - Pudding Cup -- Oral - Honey Teaspoon -- Oral - Honey Cup -- Oral - Nectar Teaspoon -- Oral - Nectar Cup -- Oral - Nectar Straw -- Oral - Thin Teaspoon -- Oral - Thin Cup WFL Oral - Thin Straw WFL Oral - Puree Reduced posterior propulsion Oral - Mech Soft -- Oral - Regular Reduced posterior propulsion;Impaired mastication;Weak lingual manipulation Oral - Multi-Consistency -- Oral - Pill Delayed oral transit;Decreased bolus cohesion Oral Phase - Comment --  CHL IP PHARYNGEAL PHASE 07/13/2020 Pharyngeal Phase Impaired Pharyngeal- Pudding Teaspoon -- Pharyngeal -- Pharyngeal- Pudding Cup -- Pharyngeal -- Pharyngeal- Honey Teaspoon -- Pharyngeal -- Pharyngeal- Honey Cup -- Pharyngeal -- Pharyngeal- Nectar Teaspoon -- Pharyngeal -- Pharyngeal- Nectar Cup -- Pharyngeal -- Pharyngeal- Nectar Straw -- Pharyngeal -- Pharyngeal- Thin Teaspoon -- Pharyngeal -- Pharyngeal- Thin Cup Delayed swallow initiation-pyriform sinuses;Reduced airway/laryngeal closure;Trace aspiration;Penetration/Aspiration during swallow;Pharyngeal residue - valleculae;Pharyngeal residue - pyriform Pharyngeal Material enters airway, passes BELOW cords and not ejected out despite cough attempt by patient Pharyngeal- Thin Straw Delayed swallow initiation-pyriform sinuses;Pharyngeal residue - pyriform;Pharyngeal residue - valleculae Pharyngeal -- Pharyngeal- Puree Delayed swallow initiation-vallecula;Reduced pharyngeal peristalsis;Pharyngeal residue - valleculae;Pharyngeal residue - pyriform Pharyngeal -- Pharyngeal- Mechanical Soft -- Pharyngeal -- Pharyngeal- Regular Delayed swallow initiation-vallecula;Pharyngeal residue - valleculae Pharyngeal -- Pharyngeal- Multi-consistency -- Pharyngeal -- Pharyngeal- Pill Delayed swallow initiation-vallecula Pharyngeal -- Pharyngeal Comment --  CHL IP CERVICAL ESOPHAGEAL PHASE 07/13/2020 Cervical Esophageal Phase Impaired Pudding Teaspoon -- Pudding Cup -- Honey Teaspoon --  Honey Cup -- Nectar Teaspoon -- Nectar Cup -- Nectar Straw -- Thin Teaspoon -- Thin Cup -- Thin Straw -- Puree Reduced cricopharyngeal relaxation Mechanical Soft -- Regular Reduced cricopharyngeal relaxation Multi-consistency -- Pill WFL;Reduced cricopharyngeal relaxation Cervical Esophageal Comment appearance of likely edema (no radiologist to verify) at location of ACDF resulting in reduced esophageal opening and transit of boluses Angela Nevin, MA, CCC-SLP Speech Therapy             Recent Labs    07/12/20 0513  WBC 6.8  HGB 13.4  HCT 40.4  PLT 346   Recent Labs    07/12/20 0513  NA 137  K 4.1  CL 101  CO2 27  GLUCOSE 91  BUN 9  CREATININE 0.82  CALCIUM 9.5    Intake/Output Summary (Last 24 hours) at 07/14/2020 1857 Last data filed at 07/14/2020 1230 Gross per 24 hour  Intake 297 ml  Output --  Net 297 ml        Physical Exam: Vital Signs Blood pressure 119/87, pulse 93, temperature 97.6 F (36.4 C), resp. rate 18, height 5\' 7"  (1.702 m), weight 81.4 kg, SpO2 100 %.  Gen: no distress, normal appearing HEENT: oral mucosa pink and moist, NCAT Cardio: Reg rate Chest: normal effort, normal rate of breathing Abd: soft, non-distended Ext: no edema Psych: pleasant, normal affect Musculoskeletal:     Cervical back: Normal range of motion and neck supple.     Comments: Left knee pain with flexion.  Left knee with tenderness and mild edema Swelling to right elbow   Skin:    General: Skin is warm and dry.     Comments: Tinea pedis bilaterally with cracked right heel and thick toe nails.    Neurological: Ox3   MAS of 1 to 1+ in UEs/LEs with Hoffman's brisk (+) in UEs- no change Motor: Bilateral upper extremities:  Shoulder abduction, elbow flexion/extension 4/5, wrist extension 3+/5, and intrinsics 2/5 Right lower extremity: Hip flexion, knee extension 2+/5, ankle dorsiflexion 4/5 Left lower extremity: Hip flexion, knee extension 3 -/5, ankle dorsiflexion  4/5 Sensation to light touch diminished distal to elbows and knees  Psychiatric:    appropriate   Assessment/Plan: 1. Functional deficits which require 3+ hours per day of interdisciplinary therapy in a comprehensive inpatient rehab setting.  Physiatrist is providing close team supervision and 24 hour management of active medical problems listed below.  Physiatrist and rehab team continue to assess barriers to discharge/monitor patient progress toward functional and medical goals  Care Tool:  Bathing    Body parts bathed by patient: Right arm,Left arm,Chest,Abdomen,Right upper leg,Left upper leg,Face   Body parts bathed by helper: Buttocks,Right lower leg,Left lower leg,Front perineal area     Bathing assist Assist Level: Moderate Assistance - Patient 50 - 74%     Upper Body Dressing/Undressing Upper body dressing   What is the patient wearing?: Pull over shirt    Upper body assist Assist Level: Minimal Assistance - Patient > 75%    Lower Body Dressing/Undressing Lower body dressing      What is the patient wearing?: Incontinence brief,Pants     Lower body assist Assist for lower body dressing: 2 Helpers     Toileting Toileting    Toileting assist Assist for toileting: Total Assistance - Patient < 25%     Transfers Chair/bed transfer  Transfers assist     Chair/bed transfer assist level: Maximal Assistance - Patient 25 - 49%     Locomotion Ambulation   Ambulation assist   Ambulation activity did not occur: Safety/medical concerns          Walk 10 feet activity   Assist  Walk 10 feet activity did not occur: Safety/medical concerns        Walk 50 feet activity   Assist Walk 50 feet with 2 turns activity did not occur: Safety/medical concerns         Walk 150 feet activity   Assist Walk 150 feet activity did not occur: Safety/medical concerns         Walk 10 feet on uneven surface  activity   Assist Walk 10 feet on uneven  surfaces activity did not occur: Safety/medical concerns         Wheelchair     Assist Will patient use wheelchair at discharge?: Yes Type of Wheelchair: Manual    Wheelchair assist level: Moderate Assistance - Patient 50 - 74% Max wheelchair distance: 150    Wheelchair 50 feet with 2 turns activity    Assist        Assist Level: Minimal Assistance - Patient > 75%   Wheelchair 150 feet activity     Assist      Assist Level: Maximal Assistance - Patient 25 - 49%   Blood pressure 119/87, pulse 93, temperature 97.6 F (36.4 C), resp. rate 18, height 5\' 7"  (1.702 m), weight 81.4 kg, SpO2 100 %.  Medical Problem List and Plan: 1.  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             -patient may shower             -ELOS/Goals: 14 to 17 days/supervision/min a            Continue CIR 2.  Antithrombotics:  -DVT/anticoagulation:  Pharmaceutical: Continue Lovenox  2/17- Very high risk of DVT due to SCI-will need 2-3 months of Lovenox per CHEST guidelines.   2/19: given new onset swelling in right elbow, discussed ordered US and patient is agreeable             -antiplatelet therapy: N/A 3. Pain Management: Continue Oxycodone prn.              Monitor with increased exertion, particularly neuropathic pain 4. Mood: LCSW to follow for evaluation and support.              -antipsychotic agents: N/A 5. Neuropsych: This patient is capable of making decisions on her own behalf. 6. Skin/Wound Care:  Monitor incision for healing.  Added protein supplement to help promote healing.  7. Fluids/Electrolytes/Nutrition: Monitor I/Os.              Monitor CMP 8. ABLA:              Monitor CBC 9. Hypokalemia/Hypomagnesemia: Has resolved with brief supplementation.              Labs ordered for tomorrow 10. Macrocytosis/Polysubstance abuse:              Vitamin B12/folate levels  ordered.   217- counseling given about polysubstance abuse 11. Neurogenic bowel and constipation: KUB ordered to determine stool burden.  2/17- moderate R sided stool burden with a lot of contrast left in colon- will order bowel program with dig stim nightly and con't Miralax BID for now- she has no control of stool per pt.   2/18- had 5+ BMs last night- feels cleaned out- now- kept saying had more in her- but now feels "great'.  12. Neurogenic bladder  2/17- PVRs 0-100 cc so far- not on Flomax- con't regimen for now- has no control of bladder, of note.  13. Neuropathic pain  2/17- start Lyrica 50 mg TID for nerve pain- hopefully will help "being on fire".   2/18- slightly better today- con't regimen 14. Spasticity  2/17- will start Baclofen 5 mg QID for muscle tightness/spasms.     2/18- spasms much better- con't regimen  LOS: 3 days A FACE TO FACE EVALUATION WAS PERFORMED  Drema Pry Maximillian Habibi 07/14/2020, 6:57 PM

## 2020-07-14 NOTE — Plan of Care (Signed)
  Problem: SCI BOWEL ELIMINATION Goal: RH STG MANAGE BOWEL WITH ASSISTANCE Description: STG Manage Bowel with Mod I Assistance. Outcome: Not Progressing; bowel program

## 2020-07-15 LAB — GLUCOSE, CAPILLARY: Glucose-Capillary: 77 mg/dL (ref 70–99)

## 2020-07-15 MED ORDER — PREGABALIN 75 MG PO CAPS
75.0000 mg | ORAL_CAPSULE | Freq: Three times a day (TID) | ORAL | Status: DC
  Administered 2020-07-15: 75 mg via ORAL
  Filled 2020-07-15: qty 1

## 2020-07-15 MED ORDER — HYDROCERIN EX CREA
TOPICAL_CREAM | Freq: Two times a day (BID) | CUTANEOUS | Status: DC
  Filled 2020-07-15: qty 113

## 2020-07-15 MED ORDER — PREGABALIN 50 MG PO CAPS
50.0000 mg | ORAL_CAPSULE | Freq: Three times a day (TID) | ORAL | Status: DC
Start: 2020-07-15 — End: 2020-07-16
  Filled 2020-07-15 (×2): qty 1

## 2020-07-15 NOTE — Progress Notes (Signed)
Bowel program done; No BM.

## 2020-07-15 NOTE — Progress Notes (Signed)
Bowel program done; No BM. 

## 2020-07-15 NOTE — Progress Notes (Signed)
Physical Therapy Session Note  Patient Details  Name: Yvette Phillips MRN: [id number] Date of Birth: Feb 19, 1961  Today's Date: 07/15/2020 PT Individual Time: 1400-1500 PT Individual Time Calculation (min): 60 min   Short Term Goals: Week 1:  PT Short Term Goal 1 (Week 1): pt to demonstrate supine<>sit mod A PT Short Term Goal 2 (Week 1): pt to demonstrate functional transfer with LRAD at mod A PT Short Term Goal 3 (Week 1): pt to demonstrate gait training with LRAD for 20' PT Short Term Goal 4 (Week 1): pt to demonstrate standing balance at mod A for at least 3 mins  Skilled Therapeutic Interventions/Progress Updates:    Pt received seated in recliner in room, agreeable to PT session. No complaints of pain. Pt reports she has been incontinent of urine. Sit to stand with mod A to stedy. Pt able to perform sit to stand from perched stedy seat with min A. Pt able to maintain standing in intervals while pericare and brief change performed. Stedy transfer to w/c. Manual w/c propulsion x 50 ft with use of BUE at Supervision level. Sit to stand in // bars with up to mod A required. While in standing pt able to perform L/R lateral weight shifting and progress to alt LE lifts with knees blocked. Provided w/c gloves to patient for improved grip with w/c mobility. Manual w/c propulsion x 100 ft with use of BUE at Supervision level, cues for improved technique. Seated hip/knee ext on Kinetron level 80 cm/sec, 5 x 5 reps to fatigue. Pt requires some assist for RLE extension while on Kinetron. Pt requests to return to bed at end of session. Stedy transfer w/c to bed. Sit to supine mod A needed for BLE management. Pt left semi-reclined in bed with needs in reach, bed alarm in place at end of session.  Therapy Documentation Precautions:  Precautions Precautions: Fall,Cervical Precaution Comments: s/p ACDF C4-5 (no cervical brace needed per acute), BUE/LE weakness Restrictions Weight Bearing Restrictions:  No   Therapy/Group: Individual Therapy   Peter Congo, PT, DPT  07/15/2020, 5:20 PM

## 2020-07-15 NOTE — Progress Notes (Signed)
NT called RN to room claims patient not responding; Vital signs taken WNL; CBG was 77 patient was clammy; RN did chest rubs and called CN. CN did chest rubs on patient but patient was in deep sleep. After 3 mins of chest rub patient woke up saying "aww that hurts get off me" Rapid response RN  was called. Patient is fully awake at this time claiming she having a dream and can hear [place] . Neuro check was done and CKG done by Rapid response RN. And results was good. Dr. Carlis Abbott notified. New orders noted.

## 2020-07-15 NOTE — Progress Notes (Signed)
Occupational Therapy Session Note  Patient Details  Name: Yvette Phillips MRN: [id number] Date of Birth: 11/15/1960  Today's Date: 07/15/2020 OT Individual Time: 2094-7096 OT Individual Time Calculation (min): 30 min    Short Term Goals: Week 1:  OT Short Term Goal 1 (Week 1): Pt will perform self feeding with AE with Supervision OT Short Term Goal 2 (Week 1): Pt will perform sit > stand at AD with no more than Mod A in prep for LB ADL OT Short Term Goal 3 (Week 1): Pt will perform BSC/toilet transfers with no more than Mod of 1 OT Short Term Goal 4 (Week 1): Pt will increase functional grip strength to perform oral hygiene w/ Supervision  Skilled Therapeutic Interventions/Progress Updates:    Pt in bed upon arrival with NT present. Supine>sit EOB with mod A. Sit<>stand with Sedy-mod A. Tranfser to recliner with Stedy. Pt engaged in BUE strengthening and dexterity activity with tan theraputty and foam cues. Pt formed putty into ball and rolled out into "snake." Pt performed task with RUE and LUE. Issued small beads but pt unable to pick up from table at this time. Pt also used pink foam block for grip strengthening. Pt reamined in recliner with all needs within reach. SEat alarm activated.  Therapy Documentation Precautions:  Precautions Precautions: Fall,Cervical Precaution Comments: s/p ACDF C4-5 (no cervical brace needed per acute), BUE/LE weakness Restrictions Weight Bearing Restrictions: No   Pain: Pt denies pain this morning  Therapy/Group: Individual Therapy  [name] 07/15/2020, 12:18 PM

## 2020-07-15 NOTE — Progress Notes (Signed)
Speech Language Pathology Daily Session Note  Patient Details  Name: Yvette Phillips MRN: [id number] Date of Birth: [birth year]  Today's Date: 07/15/2020 SLP Individual Time: 0815-0855 SLP Individual Time Calculation (min): 40 min  Short Term Goals: Week 1: SLP Short Term Goal 1 (Week 1): Patient will participate in MBS to determine current baseline swallow function and determine need for continued SLP intervention.  Skilled Therapeutic Interventions: Pt was seen for skilled ST targeting dysphagia goals. Upon arrival, pt with excellent recall of her current diet recommendations, including modified Dys 2 solid textures. However, she did not recall recommendations to perform extra dry swallows for clearance of pharyngeal residue, which was evidenced on MBSS conducted on 07/13/20. Therefore, Min A verbal cues provided for her to implement this strategy during intake of Dys 2 texture snack graham cracker pieces in pudding) and thin H2O. She did demonstrate ability to alternate solids and liquids Mod I. Pt exhibited 1 immediate strong cough response during intake, after she added thin H2O to a solid texture bolus. Discussed special considerations and precautions for dual consistency POs. SLP instructed pt to fully swallow solid boluses X2 prior to adding liquid to oral cavity. Recommend continue current diet and skilled ST services to ensure diet safety, efficiency, and carryover of compensatory swallowing strategies. Pt left sitting in chair with alarm set and needs within reach. Continue per current plan of care.        Pain Pain Assessment Pain Scale: Faces Faces Pain Scale: No hurt  Therapy/Group: Individual Therapy  Little Ishikawa 07/15/2020, 7:24 AM

## 2020-07-15 NOTE — Progress Notes (Signed)
PROGRESS NOTE   Subjective/Complaints: C/o swelling bilateral elbows Discussed that vascular ultrasound is not available on weekend, but we can check on Monday given her concerns for clot Continues to have neuropathic pain, increase lyrica to 75mg  TID   ROS:  Pt denies SOB, abd pain, CP, N/V/C/D, and vision changes, +swelling bilateral elbows, +neuropathic pain   Objective:   No results found. No results for input(s): WBC, HGB, HCT, PLT in the last 72 hours. No results for input(s): NA, K, CL, CO2, GLUCOSE, BUN, CREATININE, CALCIUM in the last 72 hours.  Intake/Output Summary (Last 24 hours) at 07/15/2020 0952 Last data filed at 07/15/2020 0730 Gross per 24 hour  Intake 417 ml  Output -  Net 417 ml        Physical Exam: Vital Signs Blood pressure 128/78, pulse 86, temperature 98 F (36.7 C), resp. rate 16, height 5\' 7"  (1.702 m), weight 81.4 kg, SpO2 99 %.  Gen: no distress, normal appearing HEENT: oral mucosa pink and moist, NCAT Cardio: Reg rate Chest: normal effort, normal rate of breathing Abd: soft, non-distended Ext: no edema Psych: pleasant, normal affect Skin: intact Musculoskeletal:     Cervical back: Normal range of motion and neck supple.     Comments: Left knee pain with flexion.  Left knee with tenderness and mild edema Swelling to right elbow   Skin:    General: Skin is warm and dry.     Comments: Tinea pedis bilaterally with cracked right heel and thick toe nails.    Neurological: Ox3   MAS of 1 to 1+ in UEs/LEs with Hoffman's brisk (+) in UEs- no change Motor: Bilateral upper extremities: Shoulder abduction, elbow flexion/extension 4/5, wrist extension 3+/5, and intrinsics 2/5 Right lower extremity: Hip flexion, knee extension 2+/5, ankle dorsiflexion 4/5 Left lower extremity: Hip flexion, knee extension 3 -/5, ankle dorsiflexion 4/5 Sensation to light touch diminished distal to elbows and  knees  Psychiatric:    appropriate   Assessment/Plan: 1. Functional deficits which require 3+ hours per day of interdisciplinary therapy in a comprehensive inpatient rehab setting.  Physiatrist is providing close team supervision and 24 hour management of active medical problems listed below.  Physiatrist and rehab team continue to assess barriers to discharge/monitor patient progress toward functional and medical goals  Care Tool:  Bathing    Body parts bathed by patient: Right arm,Left arm,Chest,Abdomen,Right upper leg,Left upper leg,Face   Body parts bathed by helper: Buttocks,Right lower leg,Left lower leg,Front perineal area     Bathing assist Assist Level: Moderate Assistance - Patient 50 - 74%     Upper Body Dressing/Undressing Upper body dressing   What is the patient wearing?: Pull over shirt    Upper body assist Assist Level: Minimal Assistance - Patient > 75%    Lower Body Dressing/Undressing Lower body dressing      What is the patient wearing?: Incontinence brief,Pants     Lower body assist Assist for lower body dressing: 2 Helpers     Toileting Toileting    Toileting assist Assist for toileting: Total Assistance - Patient < 25%     Transfers Chair/bed transfer  Transfers assist  Chair/bed transfer assist level: Maximal Assistance - Patient 25 - 49%     Locomotion Ambulation   Ambulation assist   Ambulation activity did not occur: Safety/medical concerns          Walk 10 feet activity   Assist  Walk 10 feet activity did not occur: Safety/medical concerns        Walk 50 feet activity   Assist Walk 50 feet with 2 turns activity did not occur: Safety/medical concerns         Walk 150 feet activity   Assist Walk 150 feet activity did not occur: Safety/medical concerns         Walk 10 feet on uneven surface  activity   Assist Walk 10 feet on uneven surfaces activity did not occur: Safety/medical  concerns         Wheelchair     Assist Will patient use wheelchair at discharge?: Yes Type of Wheelchair: Manual    Wheelchair assist level: Moderate Assistance - Patient 50 - 74% Max wheelchair distance: 150    Wheelchair 50 feet with 2 turns activity    Assist        Assist Level: Minimal Assistance - Patient > 75%   Wheelchair 150 feet activity     Assist      Assist Level: Maximal Assistance - Patient 25 - 49%   Blood pressure 128/78, pulse 86, temperature 98 F (36.7 C), resp. rate 16, height 5\' 7"  (1.702 m), weight 81.4 kg, SpO2 99 %.  Medical Problem List and Plan: 1.  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             -patient may shower             -ELOS/Goals: 14 to 17 days/supervision/min a            Continue CIR 2.  Antithrombotics:  -DVT/anticoagulation:  Pharmaceutical: Continue Lovenox  2/17- Very high risk of DVT due to SCI-will need 2-3 months of Lovenox per CHEST guidelines.   2/20: discussed with patient that vascular ultrasound is not available on weekends but that we can order upper extremity ultrasounds to assess for clots on Monday             -antiplatelet therapy: N/A 3. Pain Management: Continue Oxycodone prn. Increase Lyrica to 75mg  TID              Monitor with increased exertion, particularly neuropathic pain 4. Mood: LCSW to follow for evaluation and support.              -antipsychotic agents: N/A 5. Neuropsych: This patient is capable of making decisions on her own behalf. 6. Skin/Wound Care:  Monitor incision for healing.  Added protein supplement to help promote healing.  7. Fluids/Electrolytes/Nutrition: Monitor I/Os.              Monitor CMP 8. ABLA:              Monitor CBC 9. Hypokalemia/Hypomagnesemia: Has resolved with brief supplementation.              Labs ordered for tomorrow 10. Macrocytosis/Polysubstance  abuse:              Vitamin B12/folate levels ordered.   217- counseling given about polysubstance abuse 11. Neurogenic bowel and constipation: KUB ordered to determine stool burden.  2/17- moderate R sided stool burden with a lot of contrast left  in colon- will order bowel program with dig stim nightly and con't Miralax BID for now- she has no control of stool per pt.   2/18- had 5+ BMs last night- feels cleaned out- now- kept saying had more in her- but now feels "great'.  12. Neurogenic bladder  2/17- PVRs 0-100 cc so far- not on Flomax- con't regimen for now- has no control of bladder, of note.  13. Neuropathic pain  2/17- start Lyrica 50 mg TID for nerve pain- hopefully will help "being on fire".   2/18- slightly better today- con't regimen 14. Spasticity  2/17- will start Baclofen 5 mg QID for muscle tightness/spasms.     2/18- spasms much better- con't regimen 15. Dry skin: Eucerin ordered  LOS: 4 days A FACE TO FACE EVALUATION WAS PERFORMED  Drema Pry Caci Orren 07/15/2020, 9:52 AM

## 2020-07-15 NOTE — Significant Event (Signed)
Rapid Response Event Note   Reason for Call :  Difficulty arousing the patient  Initial Focused Assessment:  Received a call from the charge RN stating that this patient was extremely hard to arouse.  The charge nurse had to sternal rub the patient to get her to wake up.  When I got to the room the patient was awake and conversing with staff.  The patient is AO x 4.  She denies and chest pain or SOB.  She states that she was having very vivid dreams before she was awoken.      Interventions:  NIHSS  0 12-lead ECG shows normal sinus rhythm Vitals taken  Plan of Care:  Patient will remain on the unit.  MD was notified and she mentioned that the patient's lyrica was increased today due to the neuropathic pain that the patient was experiencing.   Event Summary:   MD Notified: Rehab MD Call Time:1740 Arrival Time: 1745 End Time: 6770  Andrey Spearman, RN

## 2020-07-15 NOTE — Progress Notes (Signed)
Occupational Therapy Session Note  Patient Details  Name: Yvette Phillips MRN: [id number] Date of Birth: 1961/04/14  Today's Date: 07/15/2020 OT Individual Time: 0700-0800 OT Individual Time Calculation (min): 60 min    Short Term Goals: Week 1:  OT Short Term Goal 1 (Week 1): Pt will perform self feeding with AE with Supervision OT Short Term Goal 2 (Week 1): Pt will perform sit > stand at AD with no more than Mod A in prep for LB ADL OT Short Term Goal 3 (Week 1): Pt will perform BSC/toilet transfers with no more than Mod of 1 OT Short Term Goal 4 (Week 1): Pt will increase functional grip strength to perform oral hygiene w/ Supervision  Skilled Therapeutic Interventions/Progress Updates:    Pt resting in bed upon arrival. Pt had just completed eating breakfast. OT intervention with focus on bed mobility, sitting balance, bathing/dressing with sit<>stand from w/c, activity tolerance, and safety awareness to increase independence with BADLs. Supine>sit EOB with mod A. Sitting balance with supervision. Transfer to w/c with Stedy-mod A for sit<>stand. UB bathing with supervision. UB dressing with min A to pull over trunk. Max A for LB bathing. Tot A for LB dressing. Sit<>stand X 4 with Stedy with mod A. Sit<>stand from [facility] seat with min A. Pt transferred to recliner with Stedy. Pt remained in recliner with all needs within reach and seat alarm activated.   Therapy Documentation Precautions:  Precautions Precautions: Fall,Cervical Precaution Comments: s/p ACDF C4-5 (no cervical brace needed per acute), BUE/LE weakness Restrictions Weight Bearing Restrictions: No Pain: Pain Assessment Pain Scale: 0-10 Pain Score: 3  Pain Type: Acute pain Pain Location: Head Pain Descriptors / Indicators: Aching Pain Frequency: Intermittent Pain Onset: With Activity Pain Intervention(s): premedicated   Therapy/Group: Individual Therapy  [name] 07/15/2020, 8:05 AM

## 2020-07-15 NOTE — Plan of Care (Signed)
  Problem: SCI BOWEL ELIMINATION Goal: RH STG MANAGE BOWEL WITH ASSISTANCE Description: STG Manage Bowel with Mod I Assistance. Outcome: Not Progressing; bowel program   Problem: SCI BLADDER ELIMINATION Goal: RH STG MANAGE BLADDER WITH ASSISTANCE Description: STG Manage Bladder With Mod I Assistance Outcome: Not Progressing; incontinence

## 2020-07-16 DIAGNOSIS — K592 Neurogenic bowel, not elsewhere classified: Secondary | ICD-10-CM | POA: Diagnosis present

## 2020-07-16 DIAGNOSIS — N319 Neuromuscular dysfunction of bladder, unspecified: Secondary | ICD-10-CM | POA: Diagnosis present

## 2020-07-16 LAB — BASIC METABOLIC PANEL
Anion gap: 11 (ref 5–15)
BUN: 12 mg/dL (ref 8–23)
CO2: 26 mmol/L (ref 22–32)
Calcium: 9.5 mg/dL (ref 8.9–10.3)
Chloride: 101 mmol/L (ref 98–111)
Creatinine, Ser: 0.72 mg/dL (ref 0.44–1.00)
GFR, Estimated: >60 mL/min (ref >60)
Glucose, Bld: 85 mg/dL (ref 70–99)
Potassium: 4 mmol/L (ref 3.5–5.1)
Sodium: 138 mmol/L (ref 135–145)

## 2020-07-16 LAB — CBC
HCT: 37.4 % (ref 36.0–46.0)
Hemoglobin: 12.5 g/dL (ref 12.0–15.0)
MCH: 35.7 pg — ABNORMAL HIGH (ref 26.0–34.0)
MCHC: 33.4 g/dL (ref 30.0–36.0)
MCV: 106.9 fL — ABNORMAL HIGH (ref 80.0–100.0)
Platelets: 337 10*3/uL (ref 150–400)
RBC: 3.5 MIL/uL — ABNORMAL LOW (ref 3.87–5.11)
RDW: 13.2 % (ref 11.5–15.5)
WBC: 6.2 10*3/uL (ref 4.0–10.5)
nRBC: 0 % (ref 0.0–0.2)

## 2020-07-16 MED ORDER — PREGABALIN 25 MG PO CAPS
25.0000 mg | ORAL_CAPSULE | Freq: Three times a day (TID) | ORAL | Status: DC
  Administered 2020-07-16 – 2020-07-24 (×24): 25 mg via ORAL
  Filled 2020-07-16 (×25): qty 1

## 2020-07-16 MED ORDER — ACETAMINOPHEN 325 MG PO TABS: 325.0000 mg | ORAL_TABLET | ORAL | Status: DC | PRN

## 2020-07-16 MED ORDER — AMMONIUM LACTATE 12 % EX LOTN
TOPICAL_LOTION | Freq: Two times a day (BID) | CUTANEOUS | Status: DC
  Administered 2020-07-19 – 2020-08-11 (×4): 1 via TOPICAL
  Filled 2020-07-16 (×3): qty 225

## 2020-07-16 MED ORDER — HYDROCERIN EX CREA
TOPICAL_CREAM | Freq: Two times a day (BID) | CUTANEOUS | Status: DC
  Administered 2020-07-27 – 2020-08-11 (×3): 1 via TOPICAL
  Filled 2020-07-16 (×2): qty 113

## 2020-07-16 NOTE — Progress Notes (Signed)
Occupational Therapy Session Note  Patient Details  Name: Yvette Phillips MRN: [id number] Date of Birth: 04-12-1961  Today's Date: 07/16/2020 OT Individual Time: 1000-1100 OT Individual Time Calculation (min): 60 min    Short Term Goals: Week 1:  OT Short Term Goal 1 (Week 1): Pt will perform self feeding with AE with Supervision OT Short Term Goal 2 (Week 1): Pt will perform sit > stand at AD with no more than Mod A in prep for LB ADL OT Short Term Goal 3 (Week 1): Pt will perform BSC/toilet transfers with no more than Mod of 1 OT Short Term Goal 4 (Week 1): Pt will increase functional grip strength to perform oral hygiene w/ Supervision  Skilled Therapeutic Interventions/Progress Updates:    Pt sitting in w/c upon arrival. OT intervention with focus on BUE [facility] and gross motor tasks including strengthening tasks with clothes pins. Pt performed tan theraputty tasks with focus on isolated [facility] tasks and general grasp strength. Pt also completed colored peg board activity. Pt able to place and remove red clothes pins from dowels and edge of container. Pt returned to room and transferred to recliner with Stedy. Min A for sit<>stand in [place]. Pt remained in recliner with all needs within reach and seat alarm activated.   Therapy Documentation Precautions:  Precautions Precautions: Fall,Cervical Precaution Comments: s/p ACDF C4-5 (no cervical brace needed per acute), BUE/LE weakness Restrictions Weight Bearing Restrictions: No   Pain: Pt denies pain this morning   Therapy/Group: Individual Therapy  [name] 07/16/2020, 12:16 PM

## 2020-07-16 NOTE — Progress Notes (Signed)
Physical Therapy Session Note  Patient Details  Name: Yvette Phillips MRN: [id number] Date of Birth: [birth year]  Today's Date: 07/16/2020 PT Individual Time: 1400-1430 PT Individual Time Calculation (min): 30 min   Short Term Goals: Week 1:  PT Short Term Goal 1 (Week 1): pt to demonstrate supine<>sit mod A PT Short Term Goal 2 (Week 1): pt to demonstrate functional transfer with LRAD at mod A PT Short Term Goal 3 (Week 1): pt to demonstrate gait training with LRAD for 20' PT Short Term Goal 4 (Week 1): pt to demonstrate standing balance at mod A for at least 3 mins  Skilled Therapeutic Interventions/Progress Updates:    pt received in recliner and agreeable to therapy. Pt reported slight pain in L hip with mobility of LLE however did not rank. Pt directed in Sit to stand from recliner to Rolling walker however pt required x5 attempts to fully achieve standing at max A and required VC for hand placement,setup and increased anterior momentum to complete. Pt directed in Stand pivot transfer to bedside instead of WC 2/2 pt's increased level of assist and reported fatigue. Pt benefited from step by step cues for lateral stepping and pivot to bedside with max A. Pt directed in x5 lateral scoots on bedside for improved positioning with max A to complete as pt unable to clear bed or effectively scoot, VC for head/hips relationship given which only slightly improved. Pt directed in sit>supine mod A for BLE management onto bed. Pt reported feeling stiff throughout session "all over" and reported it was from sitting in the recliner for "awhile". Pt then reported 3/10 chest pain and 4/10 L hip pain. Nursing made aware. Pt reported comfort in bed, all needs met, All needs in reach and in good condition. Call light in hand.  Alarm set.   Therapy Documentation Precautions:  Precautions Precautions: Fall,Cervical Precaution Comments: s/p ACDF C4-5 (no cervical brace needed per acute), BUE/LE  weakness Restrictions Weight Bearing Restrictions: No General:   Vital Signs: Therapy Vitals Temp: 98.4 F (36.9 C) Temp Source: Oral Pulse Rate: 96 Resp: 18 BP: 118/70 Patient Position (if appropriate): Lying Oxygen Therapy SpO2: 100 % O2 Device: Room Air Pain:   Mobility:   Locomotion :    Trunk/Postural Assessment :    Balance:   Exercises:   Other Treatments:      Therapy/Group: Individual Therapy  Junie Panning 07/16/2020, 3:15 PM

## 2020-07-16 NOTE — Progress Notes (Signed)
PROGRESS NOTE   Subjective/Complaints:  Pt reports the swelling at B/L elbows was more like hives- was red and swollen, but has improved.   Refused some medicine last night because thinks it was the cause.   LBM last night with bowel program- but said sometimes takes up 11pm  Many nights.   Had an episode of "deep sleep' last night- required sternal rub to wake up- lasted ~ 3 minutes. Rapid response called, but pt woke with no impairments, except a sore chest from sternal rub.    Refused pneumonia shot since had dizziness from Flu shot.  Willing to get next weekend.    ROS:  Pt denies SOB, abd pain, CP, N/V/C/D, and vision changes  Objective:   No results found. Recent Labs    07/16/20 0600  WBC 6.2  HGB 12.5  HCT 37.4  PLT 337   Recent Labs    07/16/20 0600  NA 138  K 4.0  CL 101  CO2 26  GLUCOSE 85  BUN 12  CREATININE 0.72  CALCIUM 9.5    Intake/Output Summary (Last 24 hours) at 07/16/2020 0932 Last data filed at 07/16/2020 0745 Gross per 24 hour  Intake 576 ml  Output --  Net 576 ml        Physical Exam: Vital Signs Blood pressure 125/77, pulse 91, temperature 98.3 F (36.8 C), temperature source Oral, resp. rate 18, height 5\' 7"  (1.702 m), weight 81.4 kg, SpO2 96 %.  Gen: sitting up eating breakfast, appropriate, NAD HEENT: oral mucosa pink and moist, NCAT Cardio: RRR_ no JVD Chest: CTA B/L- no W/R/R- good air movement Abd: Soft, NT, ND, (+)BS  Ext: no edema in LEs- but has some diffuse swelling above and below elbows B/L- L slightly more than R- doesn't look like hives, but is slightly erythematous- is less than it was, per pt.  Psych: appropriate Skin: intact Musculoskeletal:     Cervical back: Normal range of motion and neck supple.     Comments: Left knee pain with flexion.  Left knee with tenderness and mild edema Skin:    General: Skin is warm and dry.     Comments: Tinea pedis  bilaterally with cracked right heel and thick toe nails.    Neurological: Ox3   MAS of 1 to 1+ in UEs/LEs with Hoffman's brisk (+) in UEs- no change Motor: Bilateral upper extremities: Shoulder abduction, elbow flexion/extension 4/5, wrist extension 3+/5, and intrinsics 2/5 Right lower extremity: Hip flexion, knee extension 2+/5, ankle dorsiflexion 4/5 Left lower extremity: Hip flexion, knee extension 3 -/5, ankle dorsiflexion 4/5 Sensation to light touch diminished distal to elbows and knees     Assessment/Plan: 1. Functional deficits which require 3+ hours per day of interdisciplinary therapy in a comprehensive inpatient rehab setting.  Physiatrist is providing close team supervision and 24 hour management of active medical problems listed below.  Physiatrist and rehab team continue to assess barriers to discharge/monitor patient progress toward functional and medical goals  Care Tool:  Bathing    Body parts bathed by patient: Right arm,Left arm,Chest,Abdomen,Right upper leg,Left upper leg,Face   Body parts bathed by helper: Buttocks,Right lower leg,Left lower leg,Front perineal  area     Bathing assist Assist Level: Moderate Assistance - Patient 50 - 74%     Upper Body Dressing/Undressing Upper body dressing   What is the patient wearing?: Pull over shirt    Upper body assist Assist Level: Minimal Assistance - Patient > 75%    Lower Body Dressing/Undressing Lower body dressing      What is the patient wearing?: Incontinence brief,Pants     Lower body assist Assist for lower body dressing: 2 Helpers     Toileting Toileting    Toileting assist Assist for toileting: Total Assistance - Patient < 25%     Transfers Chair/bed transfer  Transfers assist     Chair/bed transfer assist level: Dependent - mechanical lift     Locomotion Ambulation   Ambulation assist   Ambulation activity did not occur: Safety/medical concerns          Walk 10 feet  activity   Assist  Walk 10 feet activity did not occur: Safety/medical concerns        Walk 50 feet activity   Assist Walk 50 feet with 2 turns activity did not occur: Safety/medical concerns         Walk 150 feet activity   Assist Walk 150 feet activity did not occur: Safety/medical concerns         Walk 10 feet on uneven surface  activity   Assist Walk 10 feet on uneven surfaces activity did not occur: Safety/medical concerns         Wheelchair     Assist Will patient use wheelchair at discharge?: Yes Type of Wheelchair: Manual    Wheelchair assist level: Minimal Assistance - Patient > 75% Max wheelchair distance: 100'    Wheelchair 50 feet with 2 turns activity    Assist        Assist Level: Minimal Assistance - Patient > 75%   Wheelchair 150 feet activity     Assist      Assist Level: Maximal Assistance - Patient 25 - 49%   Blood pressure 125/77, pulse 91, temperature 98.3 F (36.8 C), temperature source Oral, resp. rate 18, height 5\' 7"  (1.702 m), weight 81.4 kg, SpO2 96 %.  Medical Problem List and Plan: 1.  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             -patient may shower             -ELOS/Goals: 14 to 17 days/supervision/min a            Continue CIR 2.  Antithrombotics:  -DVT/anticoagulation:  Pharmaceutical: Continue Lovenox  2/17- Very high risk of DVT due to SCI-will need 2-3 months of Lovenox per CHEST guidelines.   2/20: discussed with patient that vascular ultrasound is not available on weekends but that we can order upper extremity ultrasounds to assess for clots on Monday 2/21- based on exam, is B/L- above and below elbow itself- loks more like allergic rxn, but not hives than DVT- if gets worse, will order Dopplers             -antiplatelet therapy: N/A 3. Pain Management: Continue Oxycodone prn. Increase  Lyrica to 75mg  TID   2/21- pt reports pain is doing a little better- con't regimen             Monitor with increased exertion, particularly neuropathic pain 4. Mood: LCSW to follow for evaluation and support.              -  antipsychotic agents: N/A 5. Neuropsych: This patient is capable of making decisions on her own behalf. 6. Skin/Wound Care:  Monitor incision for healing.  Added protein supplement to help promote healing.  7. Fluids/Electrolytes/Nutrition: Monitor I/Os.              Monitor CMP 8. ABLA:              Monitor CBC 9. Hypokalemia/Hypomagnesemia: Has resolved with brief supplementation.              Labs ordered for tomorrow 10. Macrocytosis/Polysubstance abuse:              Vitamin B12/folate levels ordered.   217- counseling given about polysubstance abuse 11. Neurogenic bowel and constipation: KUB ordered to determine stool burden.  2/17- moderate R sided stool burden with a lot of contrast left in colon- will order bowel program with dig stim nightly and con't Miralax BID for now- she has no control of stool per pt.   2/18- had 5+ BMs last night- feels cleaned out- now- kept saying had more in her- but now feels "great'.   2/21- having BMs with bowel program- will ask nursing to get done by 9pm, if possible 12. Neurogenic bladder  2/17- PVRs 0-100 cc so far- not on Flomax- con't regimen for now- has no control of bladder, of note.  13. Neuropathic pain  2/17- start Lyrica 50 mg TID for nerve pain- hopefully will help "being on fire".   2/18- slightly better today- con't regimen  2/21- Increased Lyrica to 75 mg TID since no side effects of meds? But the arm swelling could be form Lyrica? 14. Spasticity  2/17- will start Baclofen 5 mg QID for muscle tightness/spasms.     2/18- spasms much better- con't regimen 15. Dry skin: Eucerin ordered 16. B/L elbow/arm swelling  2/21- refused a medicine last night- could be cause of arm swelling. Will look into this. Held Lyrica  last night- and swelling better- will d/w pt- asked her to refuse it today.    LOS: 5 days A FACE TO FACE EVALUATION WAS PERFORMED  Yvette Phillips 07/16/2020, 9:32 AM

## 2020-07-16 NOTE — Progress Notes (Signed)
Physical Therapy Session Note  Patient Details  Name: Yvette Phillips MRN: [id number] Date of Birth: December 26, 1960  Today's Date: 07/16/2020 PT Individual Time: 0800-0900 PT Individual Time Calculation (min): 60 min   Short Term Goals: Week 1:  PT Short Term Goal 1 (Week 1): pt to demonstrate supine<>sit mod A PT Short Term Goal 2 (Week 1): pt to demonstrate functional transfer with LRAD at mod A PT Short Term Goal 3 (Week 1): pt to demonstrate gait training with LRAD for 20' PT Short Term Goal 4 (Week 1): pt to demonstrate standing balance at mod A for at least 3 mins  Skilled Therapeutic Interventions/Progress Updates:    Pt received semi-reclined in bed, agreeable to PT session. No complaints of pain. Supine to sit with mod A needed for LLE management and some trunk control. Assisted pt with dressing while EOB. Sit to stand with mod A needed to stedy. Stedy to w/c. Pt is setup A to perform oral hygiene while seated in w/c at sink. Dependent transport to/from therapy gym for time conservation. Squat pivot transfer w/c to mat table with max A. Seated lateral leans L/R x 5 reps each direction with CGA to min A needed to return to upright position, occasional assist needed for improving anterior lean. Seated mini-crunches from therapy ball 2 x 10 reps with CGA and focus on activation of core musculature. Sit to stand in stedy with up to mod A needed from elevated mat table. Standing mini-squats 2 x 10 reps while in stedy with good control of BLE noted and only intermittent reliance on bracing LE on stedy platform. Stedy transfer back to w/c then back to recliner in room. Pt left seated in recliner in room with needs in reach, chair alarm in place at end of session.  Therapy Documentation Precautions:  Precautions Precautions: Fall,Cervical Precaution Comments: s/p ACDF C4-5 (no cervical brace needed per acute), BUE/LE weakness Restrictions Weight Bearing Restrictions: No    Therapy/Group:  Individual Therapy   Peter Congo, PT, DPT  07/16/2020, 12:02 PM

## 2020-07-16 NOTE — Progress Notes (Signed)
Occupational Therapy Session Note  Patient Details  Name: Akua Blethen MRN: [id number] Date of Birth: 1960-12-17  Today's Date: 07/16/2020 OT Individual Time: 0940-1005 OT Individual Time Calculation (min): 25 min    Short Term Goals: Week 1:  OT Short Term Goal 1 (Week 1): Pt will perform self feeding with AE with Supervision OT Short Term Goal 2 (Week 1): Pt will perform sit > stand at AD with no more than Mod A in prep for LB ADL OT Short Term Goal 3 (Week 1): Pt will perform BSC/toilet transfers with no more than Mod of 1 OT Short Term Goal 4 (Week 1): Pt will increase functional grip strength to perform oral hygiene w/ Supervision  Skilled Therapeutic Interventions/Progress Updates:    Pt received sitting in the recliner with no c/o pain. Pt declining ADLs, reporting fatigue and discussing yesterdays events of rapid response. Pt completed sit > stand with mod A x2 attempts with the RW. Min cueing for RW management. Pt completed stand pivot transfer with the RW with mod A, LOB posteriorly, with mod-max A to recover. RN administered medication. Pt completed 50 ft of w/c propulsion in the hallway with mod cueing for technique and B hand positioning. Pt was passed off to [employer] in hallway.   Therapy Documentation Precautions:  Precautions Precautions: Fall,Cervical Precaution Comments: s/p ACDF C4-5 (no cervical brace needed per acute), BUE/LE weakness Restrictions Weight Bearing Restrictions: No  Therapy/Group: Individual Therapy  Crissie Reese 07/16/2020, 6:28 AM

## 2020-07-17 MED ORDER — MIRABEGRON ER 25 MG PO TB24
25.0000 mg | ORAL_TABLET | Freq: Every day | ORAL | Status: DC
  Administered 2020-07-17 – 2020-07-19 (×3): 25 mg via ORAL
  Filled 2020-07-17 (×3): qty 1

## 2020-07-17 NOTE — Progress Notes (Signed)
PROGRESS NOTE   Subjective/Complaints:  Pt reports elbow swelling MUCH improved- thinks due to decreased dose of Lyrica- decreased to 25 mg TID.  Not resolved, but much improved.  Has to keep voiding- all the time is voiding- constantly- not clear if small or large amounts.   Will start Myrbetriq and will check PVRs to make sure not retaining.      ROS:  Pt denies SOB, abd pain, CP, N/V/C/D, and vision changes    Objective:   No results found. Recent Labs    07/16/20 0600  WBC 6.2  HGB 12.5  HCT 37.4  PLT 337   Recent Labs    07/16/20 0600  NA 138  K 4.0  CL 101  CO2 26  GLUCOSE 85  BUN 12  CREATININE 0.72  CALCIUM 9.5    Intake/Output Summary (Last 24 hours) at 07/17/2020 0902 Last data filed at 07/17/2020 0720 Gross per 24 hour  Intake 702 ml  Output --  Net 702 ml        Physical Exam: Vital Signs Blood pressure 121/76, pulse 91, temperature 97.8 F (36.6 C), temperature source Oral, resp. rate 18, height 5\' 7"  (1.702 m), weight 81.4 kg, SpO2 100 %.  Gen: sitting up in bed- just got cleaned up/changed from wet depends, NAD HEENT: oral mucosa pink and moist, NCAT Cardio: RRR- no JVD Chest: CTA B/L- no W/R/R- good air movement Abd: Soft, NT, ND, (+)BS  Ext: swelling around elbows much improved- close to baseline.  Psych: appropriate Skin: intact Musculoskeletal:     Cervical back: Normal range of motion and neck supple.     Comments: Left knee pain with flexion.  Left knee with tenderness and mild edema Skin:    General: Skin is warm and dry.     Comments: Tinea pedis bilaterally with cracked right heel and thick toe nails.    Neurological: Ox3   MAS of 1 to 1+ in UEs/LEs with Hoffman's brisk (+) in UEs- no change Motor: Bilateral upper extremities: Shoulder abduction, elbow flexion/extension 4/5, wrist extension 3+/5, and intrinsics 2/5 Right lower extremity: Hip flexion, knee  extension 2+/5, ankle dorsiflexion 4/5 Left lower extremity: Hip flexion, knee extension 3 -/5, ankle dorsiflexion 4/5 Sensation to light touch diminished distal to elbows and knees     Assessment/Plan: 1. Functional deficits which require 3+ hours per day of interdisciplinary therapy in a comprehensive inpatient rehab setting.  Physiatrist is providing close team supervision and 24 hour management of active medical problems listed below.  Physiatrist and rehab team continue to assess barriers to discharge/monitor patient progress toward functional and medical goals  Care Tool:  Bathing    Body parts bathed by patient: Right arm,Left arm,Chest,Abdomen,Right upper leg,Left upper leg,Face   Body parts bathed by helper: Buttocks,Right lower leg,Left lower leg,Front perineal area     Bathing assist Assist Level: Moderate Assistance - Patient 50 - 74%     Upper Body Dressing/Undressing Upper body dressing   What is the patient wearing?: Pull over shirt    Upper body assist Assist Level: Minimal Assistance - Patient > 75%    Lower Body Dressing/Undressing Lower body dressing  What is the patient wearing?: Incontinence brief,Pants     Lower body assist Assist for lower body dressing: 2 Helpers     Toileting Toileting    Toileting assist Assist for toileting: Total Assistance - Patient < 25%     Transfers Chair/bed transfer  Transfers assist     Chair/bed transfer assist level: Maximal Assistance - Patient 25 - 49% (squat pivot)     Locomotion Ambulation   Ambulation assist   Ambulation activity did not occur: Safety/medical concerns          Walk 10 feet activity   Assist  Walk 10 feet activity did not occur: Safety/medical concerns        Walk 50 feet activity   Assist Walk 50 feet with 2 turns activity did not occur: Safety/medical concerns         Walk 150 feet activity   Assist Walk 150 feet activity did not occur:  Safety/medical concerns         Walk 10 feet on uneven surface  activity   Assist Walk 10 feet on uneven surfaces activity did not occur: Safety/medical concerns         Wheelchair     Assist Will patient use wheelchair at discharge?: Yes Type of Wheelchair: Manual    Wheelchair assist level: Minimal Assistance - Patient > 75% Max wheelchair distance: 100'    Wheelchair 50 feet with 2 turns activity    Assist        Assist Level: Minimal Assistance - Patient > 75%   Wheelchair 150 feet activity     Assist      Assist Level: Maximal Assistance - Patient 25 - 49%   Blood pressure 121/76, pulse 91, temperature 97.8 F (36.6 C), temperature source Oral, resp. rate 18, height 5\' 7"  (1.702 m), weight 81.4 kg, SpO2 100 %.  Medical Problem List and Plan: 1.  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             -patient may shower             -ELOS/Goals: 14 to 17 days/supervision/min a            Continue CIR 2.  Antithrombotics:  -DVT/anticoagulation:  Pharmaceutical: Continue Lovenox  2/17- Very high risk of DVT due to SCI-will need 2-3 months of Lovenox per CHEST guidelines.   2/20: discussed with patient that vascular ultrasound is not available on weekends but that we can order upper extremity ultrasounds to assess for clots on Monday 2/21- based on exam, is B/L- above and below elbow itself- loks more like allergic rxn, but not hives than DVT- if gets worse, will order Dopplers  2/22- swelling better- was due to  med rxn.              -antiplatelet therapy: N/A 3. Pain Management: Continue Oxycodone prn. Increase Lyrica to 75mg  TID   2/21- pt reports pain is doing a little better- con't regimen             Monitor with increased exertion, particularly neuropathic pain 4. Mood: LCSW to follow for evaluation and support.              -antipsychotic  agents: N/A 5. Neuropsych: This patient is capable of making decisions on her own behalf. 6. Skin/Wound Care:  Monitor incision for healing.  Added protein supplement to help promote healing.  7. Fluids/Electrolytes/Nutrition: Monitor  I/Os.              Monitor CMP 8. ABLA:              Monitor CBC 9. Hypokalemia/Hypomagnesemia: Has resolved with brief supplementation.              2/22- K+ 4.0 and last Mg 1.9- con't regimen 10. Macrocytosis/Polysubstance abuse:              Vitamin B12/folate levels ordered.   217- counseling given about polysubstance abuse 11. Neurogenic bowel and constipation: KUB ordered to determine stool burden.  2/17- moderate R sided stool burden with a lot of contrast left in colon- will order bowel program with dig stim nightly and con't Miralax BID for now- she has no control of stool per pt.   2/18- had 5+ BMs last night- feels cleaned out- now- kept saying had more in her- but now feels "great'.   2/21- having BMs with bowel program- will ask nursing to get done by 9pm, if possible 12. Neurogenic bladder  2/17- PVRs 0-100 cc so far- not on Flomax- con't regimen for now- has no control of bladder, of note.  13. Neuropathic pain  2/17- start Lyrica 50 mg TID for nerve pain- hopefully will help "being on fire".   2/18- slightly better today- con't regimen  2/21- Increased Lyrica to 75 mg TID since no side effects of meds? But the arm swelling could be form Lyrica?  2/22- improved with decrease in lyrica dose.  14. Spasticity  2/17- will start Baclofen 5 mg QID for muscle tightness/spasms.     2/18- spasms much better- con't regimen 15. Dry skin: Eucerin ordered 16. B/L elbow/arm swelling  2/21- refused a medicine last night- could be cause of arm swelling. Will look into this. Held Lyrica last night- and swelling better- will d/w pt- asked her to refuse it today.    2/22- Lyrica decreased to 25 mg TID- swelling better 17. Dysphagia  D2 thins diet  2/22-  con't regimen until upgraded by SLP.  18. Urinary urgency/frequency  2/22- will try Myrbetriq 25 mg QHS for urinary frequency- will also check PVRs with addition of new meds, so not retaining, hopefully.   LOS: 6 days A FACE TO FACE EVALUATION WAS PERFORMED  Yvette Phillips 07/17/2020, 9:02 AM

## 2020-07-17 NOTE — Progress Notes (Signed)
Occupational Therapy Weekly Progress Note  Patient Details  Name: Yvette Phillips MRN: [id number] Date of Birth: 02/11/1961  Beginning of progress report period: July 12, 2020 End of progress report period: July 17, 2020  Patient has met 1 of 4 short term goals.  Pt progress has been slow but steady during the past week. Squat pivot transfers with max A. Functional transfers with Stedy-min/mod A for sit<>stand depending on surface height. UB bathing/dressing with supervision. LB bathing with max A. LB dressing with tot A. Pt self feeds with supervision. Oral hygiene with min A. Pt uses BUE at diminished level with significantly weak grasp and [place].   Patient continues to demonstrate the following deficits: muscle weakness, decreased cardiorespiratoy endurance, impaired timing and sequencing, abnormal tone, unbalanced muscle activation and decreased coordination and decreased sitting balance, decreased standing balance, decreased postural control and decreased balance strategies and therefore will continue to benefit from skilled OT intervention to enhance overall performance with BADL and iADL.  Patient progressing toward long term goals..  Continue plan of care.  OT Short Term Goals Week 1:  OT Short Term Goal 1 (Week 1): Pt will perform self feeding with AE with Supervision OT Short Term Goal 1 - Progress (Week 1): Met OT Short Term Goal 2 (Week 1): Pt will perform sit > stand at AD with no more than Mod A in prep for LB ADL OT Short Term Goal 2 - Progress (Week 1): Progressing toward goal OT Short Term Goal 3 (Week 1): Pt will perform BSC/toilet transfers with no more than Mod of 1 OT Short Term Goal 3 - Progress (Week 1): Progressing toward goal OT Short Term Goal 4 (Week 1): Pt will increase functional grip strength to perform oral hygiene w/ Supervision OT Short Term Goal 4 - Progress (Week 1): Progressing toward goal Week 2:  OT Short Term Goal 1 (Week 2): Pt will perform sit >  stand at AD with no more than Mod A in prep for LB ADL OT Short Term Goal 2 (Week 2): Pt will perform BSC/toilet transfers with no more than Mod of 1 OT Short Term Goal 3 (Week 2): Pt will increase functional grip strength to perform oral hygiene w/ Supervision OT Short Term Goal 4 (Week 2): Pt will complete LB dressing with max A   Leroy Libman 07/17/2020, 2:32 PM

## 2020-07-17 NOTE — Progress Notes (Signed)
Patient ID: Yvette Phillips, female   DOB: [birth year], 60 y.o.   MRN: [id number]  SW met with pt in room to provide udpates from team conference, and d/c date 3/17. Pt aware SW is able to follow-up with her sister Blanch Media. She reported her sister's phone is off and it should be on tomorrow. She states she has her phone.   SW tried pt sister on her phone number but call would not go connect.   SW received phone call from [facility] APS caseworker [phone]) requesting an update on patient. SW informed will need consent of release. SW waiting for caseworker to send SW a consent of release form for pt to complete. SW updated medical team.   [name], MSW, [place] Office: [phone] Cell: [phone] Fax: [phone]

## 2020-07-17 NOTE — Progress Notes (Signed)
Physical Therapy Weekly Progress Note  Patient Details  Name: Yvette Phillips MRN: [id number] Date of Birth: [birth year]  Beginning of progress report period: July 12, 2020 End of progress report period: July 18, 2020  Patient has met 3 of 4 short term goals.  Pt is making good progress towards therapy goals. She is currently mod A for bed mobility, mod A overall for sit to stand and stand pivot transfers with RW, Supervision for w/c mobility up to 150 ft with use of BUE, and has been able to initiate gait this date. Pt is able to ambulate up to 10 ft with assist x 2 for safety with use of RW. Pt remains motivated and exhibits great participation in therapy sessions. Pt is limited at times by frequent urinary incontinence.  Patient continues to demonstrate the following deficits muscle weakness, decreased cardiorespiratoy endurance, abnormal tone and unbalanced muscle activation and decreased sitting balance, decreased standing balance, decreased postural control and decreased balance strategies and therefore will continue to benefit from skilled PT intervention to increase functional independence with mobility.  Patient progressing toward long term goals..  Continue plan of care.  PT Short Term Goals Week 1:  PT Short Term Goal 1 (Week 1): pt to demonstrate supine<>sit mod A PT Short Term Goal 1 - Progress (Week 1): Met PT Short Term Goal 2 (Week 1): pt to demonstrate functional transfer with LRAD at mod A PT Short Term Goal 2 - Progress (Week 1): Met PT Short Term Goal 3 (Week 1): pt to demonstrate gait training with LRAD for 20' PT Short Term Goal 3 - Progress (Week 1): Progressing toward goal PT Short Term Goal 4 (Week 1): pt to demonstrate standing balance at mod A for at least 3 mins PT Short Term Goal 4 - Progress (Week 1): Met Week 2:  PT Short Term Goal 1 (Week 2): Pt will complete bed mobility with min A consistently PT Short Term Goal 2 (Week 2): Pt will perform transfers with  LRAD and min A consistently PT Short Term Goal 3 (Week 2): Pt will ambulate x 50 ft with LRAD and mod A  Therapy Documentation Precautions:  Precautions Precautions: Fall,Cervical Precaution Comments: s/p ACDF C4-5 (no cervical brace needed per acute), BUE/LE weakness Restrictions Weight Bearing Restrictions: No   Therapy/Group: Individual Therapy   Excell Seltzer, PT, DPT 07/17/2020, 5:53 PM

## 2020-07-17 NOTE — Progress Notes (Signed)
Occupational Therapy Session Note  Patient Details  Name: Yvette Phillips MRN: [id number] Date of Birth: 05-26-1961  Today's Date: 07/17/2020 OT Individual Time: 0800-0900 OT Individual Time Calculation (min): 60 min    Short Term Goals: Week 1:  OT Short Term Goal 1 (Week 1): Pt will perform self feeding with AE with Supervision OT Short Term Goal 2 (Week 1): Pt will perform sit > stand at AD with no more than Mod A in prep for LB ADL OT Short Term Goal 3 (Week 1): Pt will perform BSC/toilet transfers with no more than Mod of 1 OT Short Term Goal 4 (Week 1): Pt will increase functional grip strength to perform oral hygiene w/ Supervision  Skilled Therapeutic Interventions/Progress Updates:    Pt resting in bed upon arrival. Pt stated that staff had just "cleaned her" and changed her brief. Mod A for supine>sit EOB. Sitting balance EOB with supervision. Transfer to w/c with Stedy. Sit<>stand with min A. Bathing/dressing with sit<>stand from w/c at sink. UB bathing/dressing with supervision. LB bathing with max A. LB dressing with tot A. Sit<>stand from w/c with Stedy-min A. Pt transitioned to gym. Table activities with red clothes pins and dowel; focus on grip strength. Box/bolc test-Rt 31, Lt 32. Pt unable to complete 9 hole peg test. Pt returned to room and remained in w/c with all needs within reach.   Therapy Documentation Precautions:  Precautions Precautions: Fall,Cervical Precaution Comments: s/p ACDF C4-5 (no cervical brace needed per acute), BUE/LE weakness Restrictions Weight Bearing Restrictions: No   Pain:   Pt denies pain this morning     Therapy/Group: Individual Therapy  [name] 07/17/2020, 10:02 AM

## 2020-07-17 NOTE — Progress Notes (Signed)
Speech Language Pathology Daily Session Note  Patient Details  Name: Yvette Phillips MRN: [id number] Date of Birth: January 23, 1961  Today's Date: 07/17/2020 SLP Individual Time: 1045-1110 SLP Individual Time Calculation (min): 25 min  Short Term Goals: Week 1: SLP Short Term Goal 1 (Week 1): Patient will participate in MBS to determine current baseline swallow function and determine need for continued SLP intervention.  Skilled Therapeutic Interventions: Pt was seen for skilled ST targeting dysphagia goals. Pt continues to require Min A to verbally recall 2/3 compensatory swallow strategies to clear pharyngeal residues as recommended by her primary ST after MBSS 07/13/20. During a trial of dysphagia 3 solids (soft nutrigrain bar) pt alternated bites and sips Mod I, but required Min A cues to use chin tuck and extra dry swallows. No overt s/sx aspiration noted across solid or liquid textures. Her mastication of oral clearance of solids was [facility]. Would recommend she continue current diet for now but would benefit from continued ST interventions to reinforce recommended compensatory strategies and continue to work toward solid advancement, which pt expressed interest in. Pt left sitting in recliner with alarm set and needs within reach. Continue per current plan of care.          Pain Pain Assessment Pain Scale: 0-10 Pain Score: 0-No pain  Therapy/Group: Individual Therapy  Little Ishikawa 07/17/2020, 7:25 AM

## 2020-07-17 NOTE — Progress Notes (Signed)
Physical Therapy Session Note  Patient Details  Name: Yvette Phillips MRN: [id number] Date of Birth: May 15, 1961  Today's Date: 07/17/2020 PT Individual Time: 0900-1000; 1625-1705 PT Individual Time Calculation (min): 60 min and 40 min  Short Term Goals: Week 1:  PT Short Term Goal 1 (Week 1): pt to demonstrate supine<>sit mod A PT Short Term Goal 2 (Week 1): pt to demonstrate functional transfer with LRAD at mod A PT Short Term Goal 3 (Week 1): pt to demonstrate gait training with LRAD for 20' PT Short Term Goal 4 (Week 1): pt to demonstrate standing balance at mod A for at least 3 mins  Skilled Therapeutic Interventions/Progress Updates:    Session 1: Pt received seated in w/c in room, agreeable to PT session. No complaints of pain. Pt reports urinary incontinence in brief. Sit to stand with mod A to stedy. Pt is dependent for pericare, brief change, clothing change while in standing and while semi-perched in stedy. Assisted pt with applying lotion to feet as well as socks and shoes, dependently. Manual w/c propulsion x 150 ft with use of BUE at min A level with max multimodal cueing needed for technique, especially with turns. Sit to stand with max A to RW. Stand pivot transfer w/c to recliner with RW and max A with knees blocked. Pt left seated in recliner in room with needs in reach, chair alarm in place.  Session 2: Pt received seated in bed, agreeable to PT session. Pt reports ongoing pain in L hip that feels "sharp and stabbing", premedicated prior to start of therapy session and declines intervention. Supine to sit with mod A for LLE management and trunk control. Pt reports urinary incontinence in brief. Sit to stand with mod A to stedy. Pt able to assist with pericare in standing with setup A, dependent to don new brief. Sit to stand x 5 reps to RW from elevated bed with min A x 2. Standing alt L/R marches 2 x 10 reps with RW and mod A for balance, difficulty lifting LLE as compared to RLE.  Sidesteps L/R x 5 ft with RW and mod A for balance, occasional BLE buckling. Pt returned to supine with mod A for BLE management. Pt reports feeling overwhelmed emotionally at times regarding her diagnosis as well as feeling "blue" this date. Provided emotional support to patient and patient on schedule to see neuropsychology this week. Pt left seated in bed with needs in reach, bed alarm in place at end of session.  Therapy Documentation Precautions:  Precautions Precautions: Fall,Cervical Precaution Comments: s/p ACDF C4-5 (no cervical brace needed per acute), BUE/LE weakness Restrictions Weight Bearing Restrictions: No   Therapy/Group: Individual Therapy   Peter Congo, PT, DPT  07/17/2020, 12:25 PM

## 2020-07-17 NOTE — Patient Care Conference (Signed)
Inpatient RehabilitationTeam Conference and Plan of Care Update Date: 07/17/2020   Time: 11:23 AM    Patient Name: Yvette Phillips      Medical Record Number: [id number]  Date of Birth: 1961-05-12 Sex: Female         Room/Bed: 4M10C/4M10C-01 Payor Info: Payor: MEDICAID POTENTIAL / Plan: MEDICAID POTENTIAL / Product Type: *No Product type* /    Admit Date/Time:  07/11/2020  1:08 PM  Primary Diagnosis:  Central cord syndrome at C4 level of cervical spinal cord, subsequent encounter [facility])  Hospital Problems: Principal Problem:   Central cord syndrome at C4 level of cervical spinal cord, subsequent encounter [facility]) Active Problems:   Alcohol abuse   Dysphagia, pharyngoesophageal phase   S/P cervical spinal fusion   Acute blood loss anemia   Macrocytosis   Neurogenic bladder   Neurogenic bowel    Expected Discharge Date: Expected Discharge Date: 08/09/20  Team Members Present: Physician leading conference: Dr. Genice Rouge Care Coodinator Present: Cecile Sheerer, LCSWA;Bunnie Rehberg Marlyne Beards, RN, BSN, CRRN Nurse Present: Keturah Barre, RN PT Present: Peter Congo, PT OT Present: Ardis Rowan, COTA;Jennifer Katrinka Blazing, OT PPS Coordinator present : Fae Pippin, SLP     Current Status/Progress Goal Weekly Team Focus  Bowel/Bladder   Pt is incontinent x2.  Pt will regain continence.  Assess q shift and prn.   Swallow/Nutrition/ Hydration             ADL's   UB bathing/dressing at w/c level-min a/supervision; LB bathing/dressing-max A; sit<>stand with Stedy-min A; squat pivot transfers-max A; toileting-dependent  min A/CGA overall  BADL retraining, sit<>stand/standing balance, functional transfers, activity tolerance   Mobility   mod A bed mobility, max A squat pivot vs up to mod to stand to stedy and transfer via stedy, Supervision to min A w/c mobility up to 100 ft  min A overall, short distance gait  LE NMR, standing as able, transfers   Communication             Safety/Cognition/  Behavioral Observations            Pain   Pt reports pain is controlled with medication.  Pain will remain <3.  Assess q shift and prn.   Skin   Pt has small skin tears in groin area.  Skin will continue to heal and remain infection free.  Assess q shift and prn.     Discharge Planning:  Pt is uninsured. Anticipated caregiver will be her boyfriend Marcial Pacas. Pt has a sister that lives locally that will assist PRN.   Team Discussion: Swelling around elbows believed to be caused by Lyrica. Lyrica decreased, swelling improved. Started Baclofen. Starting Myrbetriq, will need PVR's. On the bowel program, need documentation, and family education. Little complaints of pain, MD concerned patient doesn't use PRN medication as often as she could. Tylenol available for pain. Has MASD to the bottom, treating appropriately. Uninsured, discharging to home and boyfriend to help. Patient on target to meet rehab goals: Mod assist for bed mobility, max assist with RW, squat pivot or stand pivot. Transfers with Stedy. Bathing and dressing at [facility] level, upper body min assist, lower body max assist, dependent for toileting.   *See Care Plan and progress notes for long and short-term goals.   Revisions to Treatment Plan:  Added Baclofen, Started Myrbetriq.  Teaching Needs: Family education, medication management, bladder and bowel program education, skin/wound care, transfer training, gait training, endurance training.  Current Barriers to Discharge: Inaccessible home environment, Decreased caregiver support, Home  enviroment access/layout, Incontinence, Neurogenic bowel and bladder, Wound care, Lack of/limited family support, Weight bearing restrictions, Medication compliance and Behavior  Possible Resolutions to Barriers: Continue current medications, educate weight bearing precautions, provide emotional support to patient and family.     Medical Summary Current Status: Urinary urgency/frequency- neurogenic  bowel; tylenol works for pain; MASD on groin; bowel program nightly- nerve pain- Lyrica- swelling is better  Barriers to Discharge: Decreased family/caregiver support;Home enviroment access/layout;Wound care;Weight bearing restrictions;Neurogenic Bowel & Bladder;Incontinence;Medical stability;Medication compliance  Barriers to Discharge Comments: uninsured- supposed to go home- BF to help but no one has spoken to Boyfriend- no other assistance. hard worker! Possible Resolutions to [employer] Focus: D2 thin diet- dysphagia- per SLP; bowel program nightly- Myrbetriq for urinary frequency- PVRS to prevent retention- focus on Bowel/bladder-  D/C 3/17 since uninsured-low level   Continued Need for Acute Rehabilitation Level of Care: The patient requires daily medical management by a physician with specialized training in physical medicine and rehabilitation for the following reasons: Direction of a multidisciplinary physical rehabilitation program to maximize functional independence : Yes Medical management of patient stability for increased activity during participation in an intensive rehabilitation regime.: Yes Analysis of laboratory values and/or radiology reports with any subsequent need for medication adjustment and/or medical intervention. : Yes   I attest that I was present, lead the team conference, and concur with the assessment and plan of the team.   Tennis Must 07/17/2020, 6:22 PM

## 2020-07-18 NOTE — Progress Notes (Signed)
Dig stem completed, rectal vault emptied, suppository administered. No results at this time. Informed that will attempt q 2 hours toileting to increase continence.

## 2020-07-18 NOTE — Progress Notes (Signed)
Speech Language Pathology Weekly Progress and Session Note  Patient Details  Name: Yvette Phillips MRN: [id number] Date of Birth: 05/14/1961  Beginning of progress report period: July 12, 2020 End of progress report period: July 18, 2020  Today's Date: 07/18/2020 SLP Individual Time: 5726-2035 SLP Individual Time Calculation (min): 29 min  Short Term Goals: Week 1: SLP Short Term Goal 1 (Week 1): Patient will participate in MBS to determine current baseline swallow function and determine need for continued SLP intervention. SLP Short Term Goal 1 - Progress (Week 1): Met    New Short Term Goals: Week 2: SLP Short Term Goal 1 (Week 2): Pt will consume dys 2 textures and thin liquids with supervision A verbal cues to utilize swallow strategies. SLP Short Term Goal 2 (Week 2): Pt will consume trials of dys 3 textures with minimal overt s/s aspiration noted x3 prior to diet advancement.  Weekly Progress Updates: Pt participated in MBS recommending dys 2 textures and thin liquids with precautions of chin tuck, throat clear and second swallow to reduce pharyngeal residue. Pt demonstrates difficulty carryover swallow strategies and short term recall is [facility]. SLP will focus on carryover of swallow strategies and solid advancement trials this next reporting period.  Pt would continue to benefit from skilled ST services in order to maximize functional independence, no further ST services likely needed.       Intensity: Minumum of 1-2 x/day, 30 to 90 minutes Frequency: 3 to 5 out of 7 days Duration/Length of Stay: 3/17? Treatment/Interventions: Dysphagia/aspiration precaution training;Patient/family education   Daily Session  Skilled Therapeutic Interventions: Skilled ST services focused on cognitive skills. SLP facilitated assessment of recall skills following concerns of limited carryover of swallow strategies. Pt scored WFL on CLQT memory subtest, however during session recalled swallow  strategies what appeared to be out of order and did not implement strategies without initial cue. SLP added carryover of swallow strategies goal and recommends further education with pt in reviewing MBS to highlight need for chin tuck and second swallow. Pt was left in room with call bell within reach and bed alarm set. SLP recommends to continue skilled services.   General    Pain Pain Assessment Pain Scale: 0-10 Pain Score: 4  Faces Pain Scale: No hurt Pain Type: Acute pain Pain Location: Leg Pain Orientation: Right;Left Pain Descriptors / Indicators: Burning Pain Frequency: Constant Pain Onset: On-going Pain Intervention(s): Medication (See eMAR)  Therapy/Group: Individual Therapy  Apolo Cutshaw  [facility] 07/18/2020, 9:02 AM

## 2020-07-18 NOTE — Progress Notes (Signed)
Occupational Therapy Session Note  Patient Details  Name: Yvette Phillips MRN: [id number] Date of Birth: Dec 16, 1960  Today's Date: 07/18/2020 OT Individual Time: 0900-1000 OT Individual Time Calculation (min): 60 min    Short Term Goals: Week 2:  OT Short Term Goal 1 (Week 2): Pt will perform sit > stand at AD with no more than Mod A in prep for LB ADL OT Short Term Goal 2 (Week 2): Pt will perform BSC/toilet transfers with no more than Mod of 1 OT Short Term Goal 3 (Week 2): Pt will increase functional grip strength to perform oral hygiene w/ Supervision OT Short Term Goal 4 (Week 2): Pt will complete LB dressing with max A     Skilled Therapeutic Interventions/Progress Updates:    Pt semi upright in bed, no c/o pain, requesting to bathe and use bathroom.  Pt completed bed mobility with mod assist for BLE support.  Pt stood at stedy with min assist and transported to w/c, stand to sit with min assist.  Pt bathed and dressed UB with min assist.  LB dressing and bathing completed at sit<>stand level at stedy with mod assist to thread pant/brief over BLE and to wash feet.  Pt requesting to use bathroom urgently.  Pt had continent episode of bowel.  Total assist for toileting.  Min assist for sit<>stand stedy<>3 in 1 commode.  Pt left with PT arrival/present to assist.    Therapy Documentation Precautions:  Precautions Precautions: Fall,Cervical Precaution Comments: s/p ACDF C4-5 (no cervical brace needed per acute), BUE/LE weakness Restrictions Weight Bearing Restrictions: No   Therapy/Group: Individual Therapy  Amie Critchley 07/18/2020, 2:36 PM

## 2020-07-18 NOTE — Progress Notes (Signed)
Patient ID: Yvette Phillips, female   DOB: 1960/10/20, 60 y.o.   MRN: [id number]  SW faxed records request from [facility] DSS caseworker Musu Oswaldo Done to [facility] medical records (p:[phone]/f:[phone]).   Cecile Sheerer, MSW, LCSWA Office: [phone] Cell: [phone] Fax: [phone]

## 2020-07-18 NOTE — Progress Notes (Signed)
Physical Therapy Session Note  Patient Details  Name: Yvette Phillips MRN: [id number] Date of Birth: [birth year]  Today's Date: 07/18/2020 PT Individual Time: 1000-1100; 1500-1600 PT Individual Time Calculation (min): 60 min and 60 min  Short Term Goals: Week 1:  PT Short Term Goal 1 (Week 1): pt to demonstrate supine<>sit mod A PT Short Term Goal 2 (Week 1): pt to demonstrate functional transfer with LRAD at mod A PT Short Term Goal 3 (Week 1): pt to demonstrate gait training with LRAD for 20' PT Short Term Goal 4 (Week 1): pt to demonstrate standing balance at mod A for at least 3 mins  Skilled Therapeutic Interventions/Progress Updates:    Session 1: Pt received seated on stedy handed off from OT. Sit to stand with min A from perched stedy seat. Assisted pt with donning brief and pants in standing. Stedy transfer to w/c then to mat table, mod A to stand from w/c seat height. Sit to stand x 5 reps from slightly elevated mat table to RW with min A x 2 for safety, focus on eccentric control when sitting and anterior weight shift to stand. Stand pivot transfer to w/c with RW and max A for balance and RW control. Sit to stand with mod to max A from w/c seat to RW. Ambulation 3 x 10 ft with RW and mod to max A for balance with very close w/c follow for safety. Pt exhibits decreased L knee extension in stance phase, tactile cues for quad activation. Manual w/c propulsion 2 x 125 ft, 1 x 50 ft with use of BUE at Supervision level, cues for propulsion technique. Pt exhibits improved ability to perform turns in w/c this date with decreased cueing required for technique. Pt requests to return to recliner at end of session. Stedy transfer to recliner. Pt left seated in recliner in room with needs in reach, chair alarm in place at end of session. No complaints of pain this date, does report onset of stye on R eye this date, nursing notified.  Session 2: Pt received seated EOB with NT assisting pt with donning  socks for stedy transfer to toilet. Sit to stand with min A from elevated bed to stedy. Stedy transfer to toilet. Pt able to continently void (see Flowsheet for details) BM, found to be incontinent of urine. Pt is dependent for pericare and donning of new brief. Stedy transfer to w/c. Manual w/c propulsion x 150 ft with use of BUE at Supervision level with increased time and cues needed for technique. Sit to stand with mod A to RW. Stand pivot transfer w/c to/from mat table with mod A and RW. Sit to supine mod A needed for BLE management. Supine bridges 3 x 10 reps, SKFO 2 x 10 reps for BLE strengthening and control. Supine to sit with mod A needed for trunk elevation. Seated LAQ x 10 reps B (pt not able to achieve full extension), HS curls x 5 reps B. Pt unable to perform hip flexion or ankle DF against gravity. Pt requests to return to recliner at end of session. Stand pivot transfer w/c to recliner with RW and mod A. Pt left seated in recliner in room with needs in reach, chair alarm in place at end of session.  Therapy Documentation Precautions:  Precautions Precautions: Fall,Cervical Precaution Comments: s/p ACDF C4-5 (no cervical brace needed per acute), BUE/LE weakness Restrictions Weight Bearing Restrictions: No   Therapy/Group: Individual Therapy   Peter Congo, PT, DPT  07/18/2020, 12:21 PM

## 2020-07-18 NOTE — Plan of Care (Signed)
  Problem: RH Pre-functional/Other (Specify) Goal: RH LTG SLP (Specify) 1 Description: RH LTG SLP (Specify) 1 Flowsheets (Taken 07/18/2020 0850) LTG: Other SLP (Specify) 1: pt will recall and utilize swallow strategies mod I.

## 2020-07-19 MED ORDER — DULOXETINE HCL 30 MG PO CPEP
30.0000 mg | ORAL_CAPSULE | Freq: Every day | ORAL | Status: DC
  Administered 2020-07-19 – 2020-07-23 (×5): 30 mg via ORAL
  Filled 2020-07-19 (×5): qty 1

## 2020-07-19 NOTE — Progress Notes (Signed)
PROGRESS NOTE   Subjective/Complaints:  Pt reports bladder isn't "better yet".  But muscle spasms doing MUCH better-  Nerve pain is occ an issue.  Going to start timed voiding today to see if can stay dry more frequently.   Says she cannot get ramp in current place and has too many steps- says needs help finding a place- I explained I don't know how SW can help with that, but they are best person to maybe help.   ROS:   Pt denies SOB, abd pain, CP, N/V/C/D, and vision changes   Objective:   No results found. No results for input(s): WBC, HGB, HCT, PLT in the last 72 hours. No results for input(s): NA, K, CL, CO2, GLUCOSE, BUN, CREATININE, CALCIUM in the last 72 hours.  Intake/Output Summary (Last 24 hours) at 07/19/2020 1732 Last data filed at 07/19/2020 1339 Gross per 24 hour  Intake 642 ml  Output --  Net 642 ml        Physical Exam: Vital Signs Blood pressure 126/73, pulse 88, temperature 98.2 F (36.8 C), temperature source Oral, resp. rate 19, height 5\' 7"  (1.702 m), weight 81.4 kg, SpO2 100 %.  Gen: sitting up in bed; finished eating, appropriate, NAD HEENT: oral mucosa pink and moist, NCAT Cardio: RRR- no JVD Chest: CTA B/L- no W/R/R- good air movement Abd: Soft, NT, ND, (+)BS  Ext: swelling around elbows much improved- close to baseline.  Psych: appropriate- smiles but concerned about a place to live Skin: intact Musculoskeletal:     Cervical back: Normal range of motion and neck supple.     Comments: Left knee pain with flexion.  Left knee with tenderness and mild edema Skin:    General: Skin is warm and dry.     Comments: Tinea pedis bilaterally with cracked right heel and thick toe nails.    Neurological: Ox3  MAS of 1 to 1+ in UEs/LEs with Hoffman's brisk (+) in UEs- slightly better- now MAS of 1- still has Hoffman's.  Motor: Bilateral upper extremities: Shoulder abduction, elbow  flexion/extension 4/5, wrist extension 3+/5, and intrinsics 2/5 Right lower extremity: Hip flexion, knee extension 2+/5, ankle dorsiflexion 4/5 Left lower extremity: Hip flexion, knee extension 3 -/5, ankle dorsiflexion 4/5 Sensation to light touch diminished distal to elbows and knees     Assessment/Plan: 1. Functional deficits which require 3+ hours per day of interdisciplinary therapy in a comprehensive inpatient rehab setting.  Physiatrist is providing close team supervision and 24 hour management of active medical problems listed below.  Physiatrist and rehab team continue to assess barriers to discharge/monitor patient progress toward functional and medical goals  Care Tool:  Bathing    Body parts bathed by patient: Right arm,Left arm,Chest,Abdomen,Right upper leg,Left upper leg,Face   Body parts bathed by helper: Buttocks,Right lower leg,Left lower leg,Front perineal area     Bathing assist Assist Level: Moderate Assistance - Patient 50 - 74%     Upper Body Dressing/Undressing Upper body dressing   What is the patient wearing?: Pull over shirt    Upper body assist Assist Level: Supervision/Verbal cueing    Lower Body Dressing/Undressing Lower body dressing  What is the patient wearing?: Incontinence brief,Pants     Lower body assist Assist for lower body dressing: Total Assistance - Patient < 25%     Toileting Toileting    Toileting assist Assist for toileting: Total Assistance - Patient < 25%     Transfers Chair/bed transfer  Transfers assist     Chair/bed transfer assist level: Maximal Assistance - Patient 25 - 49% (stand pivot with RW)     Locomotion Ambulation   Ambulation assist   Ambulation activity did not occur: Safety/medical concerns  Assist level: 2 helpers Assistive device: Walker-rolling Max distance: 10'   Walk 10 feet activity   Assist  Walk 10 feet activity did not occur: Safety/medical concerns  Assist level: 2  helpers Assistive device: Walker-rolling   Walk 50 feet activity   Assist Walk 50 feet with 2 turns activity did not occur: Safety/medical concerns         Walk 150 feet activity   Assist Walk 150 feet activity did not occur: Safety/medical concerns         Walk 10 feet on uneven surface  activity   Assist Walk 10 feet on uneven surfaces activity did not occur: Safety/medical concerns         Wheelchair     Assist Will patient use wheelchair at discharge?: Yes Type of Wheelchair: Manual    Wheelchair assist level: Supervision/Verbal cueing Max wheelchair distance: 125'    Wheelchair 50 feet with 2 turns activity    Assist        Assist Level: Supervision/Verbal cueing   Wheelchair 150 feet activity     Assist      Assist Level: Minimal Assistance - Patient > 75%   Blood pressure 126/73, pulse 88, temperature 98.2 F (36.8 C), temperature source Oral, resp. rate 19, height 5\' 7"  (1.702 m), weight 81.4 kg, SpO2 100 %.  Medical Problem List and Plan: 1.  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             -patient may shower             -ELOS/Goals: 14 to 17 days/supervision/min a            Continue CIR 2.  Antithrombotics:  -DVT/anticoagulation:  Pharmaceutical: Continue Lovenox  2/17- Very high risk of DVT due to SCI-will need 2-3 months of Lovenox per CHEST guidelines.   2/20: discussed with patient that vascular ultrasound is not available on weekends but that we can order upper extremity ultrasounds to assess for clots on Monday 2/21- based on exam, is B/L- above and below elbow itself- loks more like allergic rxn, but not hives than DVT- if gets worse, will order Dopplers  2/22- swelling better- was due to  med rxn.              -antiplatelet therapy: N/A 3. Pain Management: Continue Oxycodone prn.  2/21- pt reports pain is doing a  little better- con't regimen  2/24- nerve pain still an issue, but had swelling with Lyrica, even at 75 mg TID-  Had to decrease to 25 mg TID, so now not that helpful. Will add Duloxetine 30 mg QHS for now and if tolerate,d increase dose.              Monitor with increased exertion, particularly neuropathic pain 4. Mood: LCSW to follow for evaluation and support.              -  antipsychotic agents: N/A 5. Neuropsych: This patient is capable of making decisions on her own behalf. 6. Skin/Wound Care:  Monitor incision for healing.  Added protein supplement to help promote healing.  7. Fluids/Electrolytes/Nutrition: Monitor I/Os.              Monitor CMP 8. ABLA:              Monitor CBC 9. Hypokalemia/Hypomagnesemia: Has resolved with brief supplementation.              2/22- K+ 4.0 and last Mg 1.9- con't regimen 10. Macrocytosis/Polysubstance abuse:              Vitamin B12/folate levels ordered.   217- counseling given about polysubstance abuse 11. Neurogenic bowel and constipation: KUB ordered to determine stool burden.  2/17- moderate R sided stool burden with a lot of contrast left in colon- will order bowel program with dig stim nightly and con't Miralax BID for now- she has no control of stool per pt.   2/18- had 5+ BMs last night- feels cleaned out- now- kept saying had more in her- but now feels "great'.   2/21- having BMs with bowel program- will ask nursing to get done by 9pm, if possible 12. Neurogenic bladder  2/17- PVRs 0-100 cc so far- not on Flomax- con't regimen for now- has no control of bladder, of note.   2/24- added Myrbetriq 25 mg daily- no improvement so far- still wet all the time- basically zero PVRs- so trying timed voiding q2 hours.  13. Neuropathic pain  2/17- start Lyrica 50 mg TID for nerve pain- hopefully will help "being on fire".   2/18- slightly better today- con't regimen  2/21- Increased Lyrica to 75 mg TID since no side effects of meds? But the arm  swelling could be from Lyrica?  2/22- improved with decrease in lyrica dose.  2/24- Add Duloxetine 30 mg QHS for nerve pain  14. Spasticity  2/17- will start Baclofen 5 mg QID for muscle tightness/spasms.     2/24- spasms well controlled now- con't Baclofen 15. Dry skin: Eucerin ordered 16. B/L elbow/arm swelling  2/21- refused a medicine last night- could be cause of arm swelling. Will look into this. Held Lyrica last night- and swelling better- will d/w pt- asked her to refuse it today.    2/22- Lyrica decreased to 25 mg TID- swelling better 17. Dysphagia  D2 thins diet  2/22- con't regimen until upgraded by SLP.  18. Urinary urgency/frequency  2/22- will try Myrbetriq 25 mg QHS for urinary frequency- will also check PVRs with addition of new meds, so not retaining, hopefully.   2/24- as per Neurogenic bladder  LOS: 8 days A FACE TO FACE EVALUATION WAS PERFORMED  Kristyn Obyrne 07/19/2020, 5:32 PM

## 2020-07-19 NOTE — Progress Notes (Signed)
Physical Therapy Session Note  Patient Details  Name: Yvette Phillips MRN: [id number] Date of Birth: April 20, 1961  Today's Date: 07/19/2020 PT Individual Time: 0900-1000 PT Individual Time Calculation (min): 60 min   Short Term Goals: Week 1:  PT Short Term Goal 1 (Week 1): pt to demonstrate supine<>sit mod A PT Short Term Goal 1 - Progress (Week 1): Met PT Short Term Goal 2 (Week 1): pt to demonstrate functional transfer with LRAD at mod A PT Short Term Goal 2 - Progress (Week 1): Met PT Short Term Goal 3 (Week 1): pt to demonstrate gait training with LRAD for 20' PT Short Term Goal 3 - Progress (Week 1): Progressing toward goal PT Short Term Goal 4 (Week 1): pt to demonstrate standing balance at mod A for at least 3 mins PT Short Term Goal 4 - Progress (Week 1): Met Week 2:  PT Short Term Goal 1 (Week 2): Pt will complete bed mobility with min A consistently PT Short Term Goal 2 (Week 2): Pt will perform transfers with LRAD and min A consistently PT Short Term Goal 3 (Week 2): Pt will ambulate x 50 ft with LRAD and mod A Week 3:     Skilled Therapeutic Interventions/Progress Updates:    PAIN Pt did not c/o pain during session, denies issue this am.  Pt seen this am for 60 min session w/focus on: ADLs, Functional mobility, general strengthening, transfer training,  Pt initially supine and agreeable to treatment.  Pt expressed urinary urgency, but unable to control and incontinent in brief less than 45 sec after sensing.   Pt rolled L/R w/max assist for brief change and perineal hygiene/total assist. Pt supine to side to sit w/rail and min assist.  Pt lifts feet for therapist to thread feet and raise pants to upper thighs.  Therapist dons socks and shoes for time efficiency. Sit to stand in stedy w/min assist, therapist raises pants for pt. Pt doffs dirty shirt and dons clean shirt w/min assist. Transfer to wc via stedy.  wc propulsion x 66f w/bilat UEs, very slow propulsion,  additional time needed.  Sit to stand from wc w/mod assist, post lean/mod assist to achive balanced upright stand.   Gait 164fx 1, 1019f 1 w/min to mod assist for balance, cues for quad/glut activation and upright posture, cues for safe use of RW/distance from pt.    At end of session, pt transported back to room and left oob in wc w/needs in reach, OT approaching for next session.      Therapy Documentation Precautions:  Precautions Precautions: Fall,Cervical Precaution Comments: s/p ACDF C4-5 (no cervical brace needed per acute), BUE/LE weakness Restrictions Weight Bearing Restrictions: No  Therapy/Group: Individual Therapy  BarCallie FieldingT Kohler24/2022, 12:53 PM

## 2020-07-19 NOTE — Progress Notes (Signed)
Patient ID: Kimiye Strathman, female   DOB: 05/26/1961, 61 y.o.   MRN: [id number]   SW made contact with [name] ([phone]) to provide updates from team conference, and d/c date 08/09/20, and discussed family education the week before patient's discharge. SW asked if there was anyway to ge tin contact with Tim, and if he drives so he can come in. They report that Jorja Loa does not drive. SW asked if they can possible assist him with getting here. SW to follow-up after team conference on Tuesday to discuss a day/time for family edu with Jorja Loa as well. SW also asked for number for pt brother per pt request to speak with him. Reports she will get SW the number later.   Cecile Sheerer, MSW, LCSWA Office: [phone] Cell: [phone] Fax: [phone]

## 2020-07-19 NOTE — Progress Notes (Signed)
Occupational Therapy Session Note  Patient Details  Name: Yvette Phillips MRN: [id number] Date of Birth: 02-Oct-1960  Today's Date: 07/19/2020 OT Individual Time: 1400-1500 OT Individual Time Calculation (min): 60 min    Short Term Goals: Week 1:  OT Short Term Goal 1 (Week 1): Pt will perform self feeding with AE with Supervision OT Short Term Goal 1 - Progress (Week 1): Met OT Short Term Goal 2 (Week 1): Pt will perform sit > stand at AD with no more than Mod A in prep for LB ADL OT Short Term Goal 2 - Progress (Week 1): Progressing toward goal OT Short Term Goal 3 (Week 1): Pt will perform BSC/toilet transfers with no more than Mod of 1 OT Short Term Goal 3 - Progress (Week 1): Progressing toward goal OT Short Term Goal 4 (Week 1): Pt will increase functional grip strength to perform oral hygiene w/ Supervision OT Short Term Goal 4 - Progress (Week 1): Progressing toward goal  Skilled Therapeutic Interventions/Progress Updates:    1:1. Pt received in bed agreeable to OT. Pt completes supine to sitting with bed rails and MOD A. Pt completes donning pants at EOB threading LLE after OT threads RLE and advances pants past jhips with MOD A sit to stand with Bed rail. Pt completes stand pivot transfer with MOD A to power up and MIN A to pivot and VC for chest elevation. Pt completes tabletop FMC/dexterity tasks for BUE NMR: flipping blocks, stacking blocks, flipping scrabble tiles, palm<>finger translation of blocks with VC for decreased compensatory movementl. Pt returned to room and to bed as stated above. Exited session with pt seated in bed, exit alarm on and call light in reach   Therapy Documentation Precautions:  Precautions Precautions: Fall,Cervical Precaution Comments: s/p ACDF C4-5 (no cervical brace needed per acute), BUE/LE weakness Restrictions Weight Bearing Restrictions: No General:   Vital Signs: Therapy Vitals Temp: 98 F (36.7 C) Pulse Rate: 80 Resp: 16 BP:  106/70 Patient Position (if appropriate): Lying Oxygen Therapy SpO2: 96 % O2 Device: Room Air Pain: Pain Assessment Pain Scale: 0-10 Pain Score: 0-No pain ADL: ADL Equipment Provided: Feeding equipment Eating: Not assessed (not fully assessed, does have built up handles and able to simulate) Grooming: Moderate assistance Upper Body Bathing: Minimal assistance Where Assessed-Upper Body Bathing: Sitting at sink Lower Body Bathing: Maximal assistance Where Assessed-Lower Body Bathing: Sitting at sink,Standing at sink Upper Body Dressing: Moderate assistance Lower Body Dressing: Dependent Where Assessed-Lower Body Dressing: Sitting at sink,Standing at sink Toileting: Not assessed Toilet Transfer: Maximal assistance Vision   Perception    Praxis   Exercises:   Other Treatments:     Therapy/Group: Individual Therapy  Tonny Branch 07/19/2020, 6:53 AM

## 2020-07-19 NOTE — Progress Notes (Signed)
Speech Language Pathology Daily Session Note  Patient Details  Name: Yvette Phillips MRN: [id number] Date of Birth: 09-Feb-1961  Today's Date: 07/19/2020 SLP Individual Time: 3710-6269 SLP Individual Time Calculation (min): 30 min  Short Term Goals: Week 2: SLP Short Term Goal 1 (Week 2): Pt will consume dys 2 textures and thin liquids with supervision A verbal cues to utilize swallow strategies. SLP Short Term Goal 2 (Week 2): Pt will consume trials of dys 3 textures with minimal overt s/s aspiration noted x3 prior to diet advancement.  Skilled Therapeutic Interventions:   Patient seen for skilled ST session focusing on swallow function goals. She reported that she has not felt as much collection of phlegm in her throat as she had been. SLP reviewed swallow precautions and MBS results to emphasize importance of chin tuck and extra swallows to clear pharyngeal residuals. Patient then observed with trial of saltine crackers, however patient only ate 3/4 of one cracker before saying she didn't want to have anymore. She did not report any increased difficulty with swallowing during intake of upgraded solid. Patient did state she feels she should stay on Dys 2 solids for now, but SLP will continue to discuss plans for possible upgrade to Dys 3 solids. Patient continues to benefit from skilled SLP intervention to maximize swallow function/safety.  Pain Pain Assessment Pain Scale: 0-10 Pain Score: 0-No pain  Therapy/Group: Individual Therapy  Angela Nevin, MA, CCC-SLP Speech Therapy

## 2020-07-19 NOTE — Progress Notes (Signed)
Physical Therapy Session Note  Patient Details  Name: Yvette Phillips MRN: [id number] Date of Birth: 02-Dec-1960  Today's Date: 07/19/2020 PT Individual Time: 1120-1200 PT Individual Time Calculation (min): 40 min   Short Term Goals: Week 1:  PT Short Term Goal 1 (Week 1): pt to demonstrate supine<>sit mod A PT Short Term Goal 1 - Progress (Week 1): Met PT Short Term Goal 2 (Week 1): pt to demonstrate functional transfer with LRAD at mod A PT Short Term Goal 2 - Progress (Week 1): Met PT Short Term Goal 3 (Week 1): pt to demonstrate gait training with LRAD for 20' PT Short Term Goal 3 - Progress (Week 1): Progressing toward goal PT Short Term Goal 4 (Week 1): pt to demonstrate standing balance at mod A for at least 3 mins PT Short Term Goal 4 - Progress (Week 1): Met Week 2:  PT Short Term Goal 1 (Week 2): Pt will complete bed mobility with min A consistently PT Short Term Goal 2 (Week 2): Pt will perform transfers with LRAD and min A consistently PT Short Term Goal 3 (Week 2): Pt will ambulate x 50 ft with LRAD and mod A Week 3:    Week 4:     Skilled Therapeutic Interventions/Progress Updates:   Pt received sitting in WC and agreeable to PT.   WC mobility x 161f and 1566fwith min-supervision assist intermittently to prevent veer to the L as well as cues for increased shoulder ROM to improve use of momentum throughout WC propulsion.   Transfer training to perform sit<>stand from [place] [facility] [place] [facility] throughout treatment with mod assist progressing to min assist with cues to push from arm rest on WC.    Standing balance while engaged in Bits visual scanning 2 x 61m72meach; Visual pursuits rotation sequence 2 x 15 min assist throughout to prevent posterior/lateral LOB and cues for use of ankle strategy to correct lateral lean as needed.   Patient returned to room and [facility] reach and all needs met.           Therapy Documentation Precautions:   Precautions Precautions: Fall,Cervical Precaution Comments: s/p ACDF C4-5 (no cervical brace needed per acute), BUE/LE weakness Restrictions Weight Bearing Restrictions: No    Pain: Pain Assessment Pain Scale: 0-10 Pain Score: 2  Pain Type: Acute pain Pain Location: Leg Pain Orientation: Right;Left Pain Descriptors / Indicators: Sharp;Shooting Pain Frequency: Constant Pain Onset: On-going Pain Intervention(s): Medication (See eMAR)    Therapy/Group: Individual Therapy  AusLorie Phenix24/2022, 12:02 PM

## 2020-07-19 NOTE — Progress Notes (Signed)
Occupational Therapy Session Note  Patient Details  Name: Yvette Phillips MRN: [id number] Date of Birth: 1960/11/04  Today's Date: 07/19/2020 OT Individual Time: 1000-1031 OT Individual Time Calculation (min): 31 min    Short Term Goals: Week 2:  OT Short Term Goal 1 (Week 2): Pt will perform sit > stand at AD with no more than Mod A in prep for LB ADL OT Short Term Goal 2 (Week 2): Pt will perform BSC/toilet transfers with no more than Mod of 1 OT Short Term Goal 3 (Week 2): Pt will increase functional grip strength to perform oral hygiene w/ Supervision OT Short Term Goal 4 (Week 2): Pt will complete LB dressing with max A  Skilled Therapeutic Interventions/Progress Updates:    Pt received in w/c, agreeable to therapy. Session focus on BUE NMR/FMC/grasp strength in prep for improved ADL performance. Self-propelled w/c total of ~80 ft with close S and increased time for BUE strengthening. Placed and retrieved resistive clothes pins level 1-4 (yellow, red, blue, green) on horizontal bar alternating with BUE. Unable to achieve 3 jaw chuck grasp with R hand without mod to max A, able to achieve lateral key pinch with min A. Transported back to room and pt left in w/c with chair alarm engaged, call bell in reach, and all immediate needs met.    Therapy Documentation Precautions:  Precautions Precautions: Fall,Cervical Precaution Comments: s/p ACDF C4-5 (no cervical brace needed per acute), BUE/LE weakness Restrictions Weight Bearing Restrictions: No Pain: Pain Assessment Pain Scale: 0-10 Pain Score: 0-No pain ADL: See Care Tool for more details.  Therapy/Group: Individual Therapy  Volanda Napoleon MS, OTR/L  07/19/2020, 6:48 AM

## 2020-07-20 DIAGNOSIS — F1910 Other psychoactive substance abuse, uncomplicated: Secondary | ICD-10-CM

## 2020-07-20 DIAGNOSIS — N319 Neuromuscular dysfunction of bladder, unspecified: Secondary | ICD-10-CM

## 2020-07-20 DIAGNOSIS — K592 Neurogenic bowel, not elsewhere classified: Secondary | ICD-10-CM

## 2020-07-20 MED ORDER — TAMSULOSIN HCL 0.4 MG PO CAPS
0.4000 mg | ORAL_CAPSULE | Freq: Every day | ORAL | Status: DC
  Administered 2020-07-20 – 2020-07-24 (×5): 0.4 mg via ORAL
  Filled 2020-07-20 (×5): qty 1

## 2020-07-20 NOTE — Progress Notes (Signed)
Speech Language Pathology Daily Session Note  Patient Details  Name: Yvette Phillips MRN: [id number] Date of Birth: 1960/11/21  Today's Date: 07/20/2020 SLP Individual Time: 0905-1001 SLP Individual Time Calculation (min): 56 min  Short Term Goals: Week 2: SLP Short Term Goal 1 (Week 2): Pt will consume dys 2 textures and thin liquids with supervision A verbal cues to utilize swallow strategies. SLP Short Term Goal 2 (Week 2): Pt will consume trials of dys 3 textures with minimal overt s/s aspiration noted x3 prior to diet advancement.  Skilled Therapeutic Interventions: Skilled ST services focused on swallow strategies. SLP provided education pertaining to dys 2 versus dys 3 textures with handouts and use of chin tuck, throat clear and dry swallow with solids following with liquids. Pt demonstrated initially recall of strategies, and only required clarification of strategies with solids only. Pt was agreeable to consume thin liquids via straw, dys 2 and dys 3 texture snack, however it was limited ( 1/2 pudding cup with graham crackers and 1 graham cracker) due to pt's preference. Pt initially required mod A verbal cues to accurately carryover swallow strategies (for complete chin tuck for all solid swallows and to coordinate dry swallows) however this faded to supervision a verbal cues over the course of the treatment session. Pt demonstrated x1 s/s aspiration when strategy was not completed accurately. SLP provided further education reviewing MBS and need for swallow strategy to reduce pharyngeal residue. All questions were answered to satisfaction. SLP will plan a dys 3 texture meal tray next session if scheduling allows.Pt was left with chair alarm set and call bell within reach. Recommend to continue skilled St services.     Pain Pain Assessment Pain Score: 0-No pain  Therapy/Group: Individual Therapy  Jupiter Kabir  [facility] 07/20/2020, 12:23 PM

## 2020-07-20 NOTE — Progress Notes (Signed)
Occupational Therapy Session Note  Patient Details  Name: Yvette Phillips MRN: [id number] Date of Birth: 21-Aug-1960  Today's Date: 07/20/2020 OT Individual Time: 0700-0800 OT Individual Time Calculation (min): 60 min    Short Term Goals: Week 1:  OT Short Term Goal 1 (Week 1): Pt will perform self feeding with AE with Supervision OT Short Term Goal 1 - Progress (Week 1): Met OT Short Term Goal 2 (Week 1): Pt will perform sit > stand at AD with no more than Mod A in prep for LB ADL OT Short Term Goal 2 - Progress (Week 1): Progressing toward goal OT Short Term Goal 3 (Week 1): Pt will perform BSC/toilet transfers with no more than Mod of 1 OT Short Term Goal 3 - Progress (Week 1): Progressing toward goal OT Short Term Goal 4 (Week 1): Pt will increase functional grip strength to perform oral hygiene w/ Supervision OT Short Term Goal 4 - Progress (Week 1): Progressing toward goal  Skilled Therapeutic Interventions/Progress Updates:    1:1. Pt received in bed reporting fatigue this morning d/t being up all night with bladder going "a lot." of note pt with mild edema in BLE and mild spasm in R hand sitting EOB. Pt requries MAX A to don pants with VC for pulling up in the back to pull over heel. MOD A sit to stand at EOB with RW and OT advances pants past hips and MOD A to pivot to w/c. UB bathing at sink with set up and A to wash back. MIN A to pull shirt down back no cuing. OT dons compression and installs elastic laces into shoes. Pt set up with breakfast in w/c, exit alarm on and call light itn reach  Therapy Documentation Precautions:  Precautions Precautions: Fall,Cervical Precaution Comments: s/p ACDF C4-5 (no cervical brace needed per acute), BUE/LE weakness Restrictions Weight Bearing Restrictions: No General:   Vital Signs: Therapy Vitals Temp: 97.9 F (36.6 C) Temp Source: Oral Pulse Rate: 87 Resp: 18 BP: 118/70 Patient Position (if appropriate): Lying Oxygen Therapy SpO2:  98 % O2 Device: Room Air Pain:   ADL: ADL Equipment Provided: Feeding equipment Eating: Not assessed (not fully assessed, does have built up handles and able to simulate) Grooming: Moderate assistance Upper Body Bathing: Minimal assistance Where Assessed-Upper Body Bathing: Sitting at sink Lower Body Bathing: Maximal assistance Where Assessed-Lower Body Bathing: Sitting at sink,Standing at sink Upper Body Dressing: Moderate assistance Lower Body Dressing: Dependent Where Assessed-Lower Body Dressing: Sitting at sink,Standing at sink Toileting: Not assessed Toilet Transfer: Maximal assistance Vision   Perception    Praxis   Exercises:   Other Treatments:     Therapy/Group: Individual Therapy  Tonny Branch 07/20/2020, 6:41 AM

## 2020-07-20 NOTE — Progress Notes (Signed)
07/19/20 Bowel program completed, rectal vault cleared, no results.  07/20/20 Bowel program: pt had multiple BMs today that were loose, however, unable to locate documentation. Suppository not administered due to several BMs throughout the day.

## 2020-07-20 NOTE — Progress Notes (Signed)
   07/20/20 0915  Clinical Encounter Type  Visited With Patient  Visit Type Follow-up  Referral From Nurse  Consult/Referral To Chaplain   Chaplain responded to page for AD notarization. Pt requested AD education to take place at 1pm when her sister arrived. Chaplain educated on AD notarization process and confirmed AD education for 1pm. Chaplain remains available.   This note was prepared by Chaplain Resident, Tacy Learn, MDiv. Chaplain remains available as needed through the on-call pager: [phone].

## 2020-07-20 NOTE — Consult Note (Signed)
Neuropsychological Consultation   Patient:   Yvette Phillips   DOB:   1960-12-31  MR Number:  [id number]  Location:  [facility] [address] CENTER B [id number] [place] [address] Dept: [phone] Loc: [phone]           Date of Service:   07/20/2020  Start Time:   10 AM End Time:   11 AM  Provider/Observer:  Arley Phenix, Psy.D.       Clinical Neuropsychologist       Billing Code/Service: [phone]  Chief Complaint:    Yvette Phillips is a 60 year old female with history of fall with progressive weakness.  Patient was admitted to [facility] 12/27-05/26/2020.  Work-up revealed C4-C5 disc herniation with severe spinal stenosis and cord compression, central cord syndrome, hypokalemia as well as mild COVID-19 infection.  Patient was initially set up for outpatient neurosurgical follow-up but continued to have bilateral upper extremity/bilateral lower extremity weakness and sustained a significant fall the evening before most recent admission.  Is reported that the patient's boyfriend put her to bed and called EMS the next day.  Patient was admitted to [facility] on 07/03/2020.  Urine drug screen was positive for cocaine and THC.  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  MRI C-spine showed large central disc protrusion at C4-5 with severe spinal stenosis and compressive myelopathy as well as severe right C4-C5 foraminal stenosis.  Patient was taken to the OR on 07/04/2020 for ACDF of C4/C5.  Hospital course has been complicated by diffuse weakness as well as difficulty with swallowing.  Patient with continued motor deficits lower extremity as well as weakness in upper extremities with difficulty standing and staggering gait.  Patient had initial significant fear around touching the ground with her feet as she had had falls and unable to get up for some time prior.  Patient's fear  around putting her feet on the ground has significantly improved with continued therapies and she denies this is a major issue at this point.  Patient has a history of significant polysubstance abuse.  She admits to intermittently but chronic use of cocaine as well as regular use of marijuana.  Patient also acknowledges history of alcohol abuse.  Patient reports that she has not had significant cravings or other withdrawal symptoms during her hospitalization.  Reason for Service:  Patient was referred for neuropsychological consultation due to coping adjustment issues as well as prior history of substance abuse.  Patient also with significant fears around touching the ground and other issues related to potential and fear of falling.  Below is the HPI for the current admission.  HPI: Yvette Phillips is a 60 year old female with history of fall around christmas with progressive weakness and admitted [facility] 12/27- 05/26/20 with work up revealing C4/C5 disc herniation with severe spinal stenosis and cord compression, central cord syndrome, hypokalemia as well as mild Covid 19 infection.  History taken from chart review and patient.  She was set for outpatient neurosurgery follow up but continued to have BUE/BLE weakness, sustained a fall the evening before.  Her boyfriend put her to bed and called EMS the next day.  She was found after a well check and admitted to [facility] on 07/03/2020.  Urine drug screen positive cocaine and THC.  She was found to have electrolyte abnormality with potassium 3.4, Mg 1.5 and CK-375.  MRI brain showed cystic encephalomalacia in high right parietal  lobe. MRI C spine showed large central disc protrusion at C4-5 with severe spinal stenosis and compressive myelopathy as well as severe right C4/C5 foraminal stenosis. Right knee contusion noted and X rays negative for fracture.    Neurosurgery evaluated patient with exam revealing sensory deficits bilateral hands below forearm and bilateral lower  extremities as well as 1/5 strength bilateral grip/wrist extension and 4/5 left biceps/triceps/iliopsoas/quads. MAPs. >80s recommended and she was taken to OR on 07/04/2020 for ACDF of C4/C5  by  Dr.  Myer Haff.  Hospital course further complicated by resulting post op diffuse weakness a s well as difficulty with swallowing.  Barium swallow done 02/11 due to difficulty swallowing and was negative for mas, stricture or ulceration. She was evaluated by ST and started on dysphagia 2, thins recommended with assistance for feeding. Therapy ongoing and patient limited by BLE/BUE weakness with difficulty standing and staggering gait, is unable to feed herself or perform ADLs. CIR recommended due to functional decline.  Please see preadmission assessment from earlier today as well.  Current Status:  Entering the room, the patient was sitting upright in her wheelchair alert and oriented.  Patient appeared to be oriented x4.  Patient acknowledged prior history of polysubstance abuse and did verbalize a desire to quit using crack cocaine but there any variables that are likely to make that are very difficult sustained effort.  These challenges include friends and family and long history of substance use for the patient.  The patient denies any significant depression or anxiety and is coping fairly well with her significant functional loss following the ventral cord injury.  Behavioral Observation: Yvette Phillips  presents as a 60 y.o.-year-old Right African American Female who appeared her stated age. her dress was Appropriate and she was Well Groomed and her manners were Appropriate to the situation.  her participation was indicative of Appropriate and Redirectable behaviors.  There were physical disabilities noted.  she displayed an appropriate level of cooperation and motivation.     Interactions:    Active Appropriate and Redirectable  Attention:   abnormal and attention span appeared shorter than expected for  age  Memory:   within normal limits; recent and remote memory intact  Visuo-spatial:  not examined  Speech (Volume):  low  Speech:   normal; normal  Thought Process:  Coherent and Relevant  Though Content:  WNL; not suicidal and not homicidal  Orientation:   person, place, time/date and situation  Judgment:   Fair  Planning:   Poor  Affect:    Appropriate  Mood:    Euthymic  Insight:   Fair  Intelligence:   low  Substance Use:  There is a documented history of alcohol, cocaine and marijuana abuse confirmed by the patient.    Medical History:  History reviewed. No pertinent past medical history.       Patient Active Problem List   Diagnosis Date Noted  . Neurogenic bladder 07/16/2020  . Neurogenic bowel 07/16/2020  . 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 HNP (herniated nucleus pulposus) with myelopathy, cervical   . S/P cervical spinal fusion   . Acute blood loss anemia   . Macrocytosis   . Dysphagia, pharyngoesophageal phase   . Pressure injury of skin 07/05/2020  . Nicotine dependence, cigarettes, uncomplicated 07/04/2020  . Hypokalemia 07/04/2020  . Hypomagnesemia 07/04/2020  . Polysubstance abuse (HCC) 07/03/2020  . Central cord syndrome (HCC) 05/21/2020  . Alcohol abuse 05/21/2020  Psychiatric History:  Patient denies any prior history of psychiatric diagnosis or treatment other than significant history of polysubstance abuse, alcohol abuse and regular marijuana use along with tobacco.  Family Med/Psych History:  Family History  Problem Relation Age of Onset  . Diabetes Mother   . Diabetes Father   . Heart disease Neg Hx     Impression/DX:  Yvette Phillips is a 60 year old female with history of fall with progressive weakness.  Patient was admitted to [facility] 12/27-05/26/2020.  Work-up revealed C4-C5 disc herniation with severe spinal stenosis and cord compression, central cord syndrome,  hypokalemia as well as mild COVID-19 infection.  Patient was initially set up for outpatient neurosurgical follow-up but continued to have bilateral upper extremity/bilateral lower extremity weakness and sustained a significant fall the evening before most recent admission.  Is reported that the patient's boyfriend put her to bed and called EMS the next day.  Patient was admitted to [facility] on 07/03/2020.  Urine drug screen was positive for cocaine and THC.  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  MRI C-spine showed large central disc protrusion at C4-5 with severe spinal stenosis and compressive myelopathy as well as severe right C4-C5 foraminal stenosis.  Patient was taken to the OR on 07/04/2020 for ACDF of C4/C5.  Hospital course has been complicated by diffuse weakness as well as difficulty with swallowing.  Patient with continued motor deficits lower extremity as well as weakness in upper extremities with difficulty standing and staggering gait.  Patient had initial significant fear around touching the ground with her feet as she had had falls and unable to get up for some time prior.  Patient's fear around putting her feet on the ground has significantly improved with continued therapies and she denies this is a major issue at this point.  Patient has a history of significant polysubstance abuse.  She admits to intermittently but chronic use of cocaine as well as regular use of marijuana.  Patient also acknowledges history of alcohol abuse.  Patient reports that she has not had significant cravings or other withdrawal symptoms during her hospitalization.   Entering the room, the patient was sitting upright in her wheelchair alert and oriented.  Patient appeared to be oriented x4.  Patient acknowledged prior history of polysubstance abuse and did verbalize a desire to quit using crack cocaine but there any variables that are likely to make that are very  difficult sustained effort.  These challenges include friends and family and long history of substance use for the patient.  The patient denies any significant depression or anxiety and is coping fairly well with her significant functional loss following the ventral cord injury.  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 Clinical Neuropsychologist

## 2020-07-20 NOTE — Progress Notes (Addendum)
   07/20/20 1327  Clinical Encounter Type  Visited With Patient and family together  Visit Type Follow-up  Referral From Nurse  Consult/Referral To Chaplain   Chaplain followed up with earlier referral. Chaplain provided AD education with Pt and Pt's sister, Alona Bene. The AD was filled out while chaplain was present to answer questions. Chaplain explained that Alona Bene didn't need to be present for notarization. Chaplain left to contact notary and witnesses.    Update: At 1400, Chaplain coordinated notary and witnesses for AD. AD was notarized and witnessed by two witnesses. A copy of thecompleted AD was placed in Pt's physical chart and two copies were given to the Pt along with the original. Chaplain remains available.  This note was prepared by Chaplain Resident, Tacy Learn, MDiv. Chaplain remains available as needed through the on-call pager: [phone].

## 2020-07-20 NOTE — Progress Notes (Signed)
Physical Therapy Session Note  Patient Details  Name: Yvette Phillips MRN: [id number] Date of Birth: 21-Nov-1960  Today's Date: 07/20/2020 PT Individual Time: 1100-1158 PT Individual Time Calculation (min): 58 min   Short Term Goals: Week 1:  PT Short Term Goal 1 (Week 1): pt to demonstrate supine<>sit mod A PT Short Term Goal 1 - Progress (Week 1): Met PT Short Term Goal 2 (Week 1): pt to demonstrate functional transfer with LRAD at mod A PT Short Term Goal 2 - Progress (Week 1): Met PT Short Term Goal 3 (Week 1): pt to demonstrate gait training with LRAD for 20' PT Short Term Goal 3 - Progress (Week 1): Progressing toward goal PT Short Term Goal 4 (Week 1): pt to demonstrate standing balance at mod A for at least 3 mins PT Short Term Goal 4 - Progress (Week 1): Met Week 2:  PT Short Term Goal 1 (Week 2): Pt will complete bed mobility with min A consistently PT Short Term Goal 2 (Week 2): Pt will perform transfers with LRAD and min A consistently PT Short Term Goal 3 (Week 2): Pt will ambulate x 50 ft with LRAD and mod A Week 3:     Skilled Therapeutic Interventions/Progress Updates:    PAIN 2/5 low back, treatment to tolerance, repositioning via change in seating/wc.  Pt initially oob and agreeable to session.  Reports chaotic am but feeling calmer now.  States talking w/Dr. Sima Matas helped. Therapist assisted pt w/donning gloves, very difficult due to lack of active finger extension.   Pt propelled wc 150f w/very slow propulsion but supervision only. Sit to stand w/mod assist, mod assist to stabilize in standing w/RW. Gait 23fx 1 w/RW w/mod assist to min assist, cues for upright posture, no extension thrust L knee today, cues to activate quads/gluts w/loading but no buckling, maintains slight flexion at knees. Therapist obtained wc w/specialty back, added theraband tubing to pushrims for grip assist, legrests adjusted for positioning.   Sit to stand w/max assist from wc, stood x 1  min w/mod assist for balance w/RW while wc swapped.  Stand to sit w/min assist. Pt reported improved comfort in sitting and improved overall positioning achieved.  Some increased effort required w/propulsion of this wc but able to propel 10027f/supervision. Pt transported to room. Pt left oob in wc w/alarm belt set and needs in reach  Therapy Documentation Precautions:  Precautions Precautions: Fall,Cervical Precaution Comments: s/p ACDF C4-5 (no cervical brace needed per acute), BUE/LE weakness Restrictions Weight Bearing Restrictions: No    Therapy/Group: Individual Therapy  BarCallie FieldingT [place]25/2022, 12:16 PM

## 2020-07-20 NOTE — Progress Notes (Signed)
PROGRESS NOTE   Subjective/Complaints:  Pt reports that bladder no better- "out of control"- goes constantly- was starting to show (+) high PVRs per pt, so sounds like is retaining- not sure if due to Nyrbetriq or was there previously, but didn't see on the PVRs done.   Will try Flomax to see if pt can fully empty and go form there.   Feet also swollen per pt. Bothersome.  Having bowel program nightly- worked well las tnight per pt.   ROS:  Pt denies SOB, abd pain, CP, N/V/C/D, and vision changes  Objective:   No results found. No results for input(s): WBC, HGB, HCT, PLT in the last 72 hours. No results for input(s): NA, K, CL, CO2, GLUCOSE, BUN, CREATININE, CALCIUM in the last 72 hours.  Intake/Output Summary (Last 24 hours) at 07/20/2020 1641 Last data filed at 07/20/2020 0730 Gross per 24 hour  Intake 220 ml  Output -  Net 220 ml        Physical Exam: Vital Signs Blood pressure 140/80, pulse 87, temperature 98.3 F (36.8 C), temperature source Oral, resp. rate 17, height 5\' 7"  (1.702 m), weight 81.4 kg, SpO2 99 %.  Gen: sitting up in bedside chair, waiting for breakfast, NAD HEENT: oral mucosa pink and moist, NCAT Cardio: RRR- no JVD Chest: CTA B/L- no W/R/R- good air movement Abd: Soft, NT, ND, (+)BS  Ext: swelling worse again- 1+ edema to mid calf B/L  Psych: appropriate Skin: intact Musculoskeletal:     Cervical back: Normal range of motion and neck supple.     Comments: Left knee pain with flexion.  Left knee with tenderness and mild edema Skin:    General: Skin is warm and dry.     Comments: Tinea pedis bilaterally with cracked right heel and thick toe nails.    Neurological: Ox3 Strong brisk hoffman's B/L- MAS of 1+ in UEs and trace to 1 in LEs.  Motor: Bilateral upper extremities: Shoulder abduction, elbow flexion/extension 4/5, wrist extension 3+/5, and intrinsics 2/5 Right lower extremity: Hip  flexion, knee extension 2+/5, ankle dorsiflexion 4/5 Left lower extremity: Hip flexion, knee extension 3 -/5, ankle dorsiflexion 4/5 Sensation to light touch diminished distal to elbows and knees     Assessment/Plan: 1. Functional deficits which require 3+ hours per day of interdisciplinary therapy in a comprehensive inpatient rehab setting.  Physiatrist is providing close team supervision and 24 hour management of active medical problems listed below.  Physiatrist and rehab team continue to assess barriers to discharge/monitor patient progress toward functional and medical goals  Care Tool:  Bathing    Body parts bathed by patient: Right arm,Left arm,Chest,Abdomen,Right upper leg,Left upper leg,Face   Body parts bathed by helper: Buttocks,Right lower leg,Left lower leg,Front perineal area     Bathing assist Assist Level: Moderate Assistance - Patient 50 - 74%     Upper Body Dressing/Undressing Upper body dressing   What is the patient wearing?: Pull over shirt    Upper body assist Assist Level: Supervision/Verbal cueing    Lower Body Dressing/Undressing Lower body dressing      What is the patient wearing?: Incontinence brief,Pants     Lower body  assist Assist for lower body dressing: Total Assistance - Patient < 25%     Toileting Toileting    Toileting assist Assist for toileting: Total Assistance - Patient < 25%     Transfers Chair/bed transfer  Transfers assist     Chair/bed transfer assist level: Maximal Assistance - Patient 25 - 49% (stand pivot with RW)     Locomotion Ambulation   Ambulation assist   Ambulation activity did not occur: Safety/medical concerns  Assist level: Moderate Assistance - Patient 50 - 74% Assistive device: Walker-rolling Max distance: 28   Walk 10 feet activity   Assist  Walk 10 feet activity did not occur: Safety/medical concerns  Assist level: Moderate Assistance - Patient - 50 - 74% Assistive device:  Walker-rolling   Walk 50 feet activity   Assist Walk 50 feet with 2 turns activity did not occur: Safety/medical concerns         Walk 150 feet activity   Assist Walk 150 feet activity did not occur: Safety/medical concerns         Walk 10 feet on uneven surface  activity   Assist Walk 10 feet on uneven surfaces activity did not occur: Safety/medical concerns         Wheelchair     Assist Will patient use wheelchair at discharge?: Yes Type of Wheelchair: Manual    Wheelchair assist level: Supervision/Verbal cueing Max wheelchair distance: 125'    Wheelchair 50 feet with 2 turns activity    Assist        Assist Level: Supervision/Verbal cueing   Wheelchair 150 feet activity     Assist      Assist Level: Minimal Assistance - Patient > 75%   Blood pressure 140/80, pulse 87, temperature 98.3 F (36.8 C), temperature source Oral, resp. rate 17, height 5\' 7"  (1.702 m), weight 81.4 kg, SpO2 99 %.  Medical Problem List and Plan: 1.  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             -patient may shower             -ELOS/Goals: 14 to 17 days/supervision/min a            Continue CIR 2.  Antithrombotics:  -DVT/anticoagulation:  Pharmaceutical: Continue Lovenox  2/17- Very high risk of DVT due to SCI-will need 2-3 months of Lovenox per CHEST guidelines.   2/20: discussed with patient that vascular ultrasound is not available on weekends but that we can order upper extremity ultrasounds to assess for clots on Monday 2/21- based on exam, is B/L- above and below elbow itself- loks more like allergic rxn, but not hives than DVT- if gets worse, will order Dopplers  2/22- swelling better- was due to  med rxn.              -antiplatelet therapy: N/A 3. Pain Management: Continue Oxycodone prn.  2/21- pt reports pain is doing a little better- con't  regimen  2/24- nerve pain still an issue, but had swelling with Lyrica, even at 75 mg TID-  Had to decrease to 25 mg TID, so now not that helpful. Will add Duloxetine 30 mg QHS for now and if tolerate,d increase dose.  2/25- no side effects so far- con't regimen and monitor              Monitor with increased exertion, particularly neuropathic pain 4. Mood: LCSW to follow for evaluation  and support.              -antipsychotic agents: N/A 5. Neuropsych: This patient is capable of making decisions on her own behalf. 6. Skin/Wound Care:  Monitor incision for healing.  Added protein supplement to help promote healing.  7. Fluids/Electrolytes/Nutrition: Monitor I/Os.              Monitor CMP 8. ABLA:              Monitor CBC 9. Hypokalemia/Hypomagnesemia: Has resolved with brief supplementation.              2/22- K+ 4.0 and last Mg 1.9- con't regimen 10. Macrocytosis/Polysubstance abuse:              Vitamin B12/folate levels ordered.   217- counseling given about polysubstance abuse 11. Neurogenic bowel and constipation: KUB ordered to determine stool burden.  2/17- moderate R sided stool burden with a lot of contrast left in colon- will order bowel program with dig stim nightly and con't Miralax BID for now- she has no control of stool per pt.   2/18- had 5+ BMs last night- feels cleaned out- now- kept saying had more in her- but now feels "great'.   2/21- having BMs with bowel program- will ask nursing to get done by 9pm, if possible  2/25- per pt, had BM on toilet last night- not clear if after bowel program- con't regimen and monitor 12. Neurogenic bladder  2/17- PVRs 0-100 cc so far- not on Flomax- con't regimen for now- has no control of bladder, of note.   2/24- added Myrbetriq 25 mg daily- no improvement so far- still wet all the time- basically zero PVRs- so trying timed voiding q2 hours.  2/25- no improvement so far with Myrbetriq and had (+) high PVRs yesterday -will stop Myrbetriq  and do Flomax 0.4 mg qsupper and send to Urology outpt.   13. Neuropathic pain  2/17- start Lyrica 50 mg TID for nerve pain- hopefully will help "being on fire".   2/18- slightly better today- con't regimen  2/21- Increased Lyrica to 75 mg TID since no side effects of meds? But the arm swelling could be from Lyrica?  2/22- improved with decrease in lyrica dose.  2/24- Add Duloxetine 30 mg QHS for nerve pain  14. Spasticity  2/17- will start Baclofen 5 mg QID for muscle tightness/spasms.     2/24- spasms well controlled now- con't Baclofen  2/25- spasms controlled, but getting a little tighter- will con't baclofen and con't to monitor 15. Dry skin: Eucerin ordered 16. B/L elbow/arm swelling  2/21- refused a medicine last night- could be cause of arm swelling. Will look into this. Held Lyrica last night- and swelling better- will d/w pt- asked her to refuse it today.    2/22- Lyrica decreased to 25 mg TID- swelling better 17. Dysphagia  D2 thins diet  2/22- con't regimen until upgraded by SLP.  18. Urinary urgency/frequency  2/22- will try Myrbetriq 25 mg QHS for urinary frequency- will also check PVRs with addition of new meds, so not retaining, hopefully.   2/24- as per Neurogenic bladder 19. LE edema  2/25- will start TEDs 8am to 6pm daily for LE edema.   LOS: 9 days A FACE TO FACE EVALUATION WAS PERFORMED  Megan Lovorn 07/20/2020, 4:41 PM

## 2020-07-21 LAB — GLUCOSE, CAPILLARY: Glucose-Capillary: 93 mg/dL (ref 70–99)

## 2020-07-21 NOTE — Progress Notes (Signed)
PROGRESS NOTE   Subjective/Complaints:  Pt reports walked 28 ft yesterday and pushed self in w/c 25400ft! Urination frequency much better with Flomax.  Very excited about this.   Had very small BM on toilet today- didn't get suppository last night- had a lot of BMs yesterday per nursing note.    ROS:  Pt denies SOB, abd pain, CP, N/V/C/D, and vision changes  Objective:   No results found. No results for input(s): WBC, HGB, HCT, PLT in the last 72 hours. No results for input(s): NA, K, CL, CO2, GLUCOSE, BUN, CREATININE, CALCIUM in the last 72 hours.  Intake/Output Summary (Last 24 hours) at 07/21/2020 1609 Last data filed at 07/21/2020 1352 Gross per 24 hour  Intake 682 ml  Output --  Net 682 ml        Physical Exam: Vital Signs Blood pressure 111/68, pulse 96, temperature 98 F (36.7 C), temperature source Oral, resp. rate 17, height 5\' 7"  (1.702 m), weight 81.4 kg, SpO2 100 %.   General: awake, alert, appropriate, NAD- sitting up in bed HENT: conjugate gaze; oropharynx moist CV: regular rate; no JVD Pulmonary: CTA B/L; no W/R/R- good air movement GI: soft, NT, ND, (+)BS- hypoactive Psychiatric: appropriate, bright Neurological: Ox3  Skin: intact, but 2-3+ LE edema Musculoskeletal:     Cervical back: Normal range of motion and neck supple.     Comments: Left knee pain with flexion.  Left knee with tenderness and mild edema Skin:    General: Skin is warm and dry.     Comments: Tinea pedis bilaterally with cracked right heel and thick toe nails.    Neurological: Ox3 Strong brisk hoffman's B/L- MAS of 1+ in UEs and trace to 1 in LEs.  Motor: Bilateral upper extremities: Shoulder abduction, elbow flexion/extension 4/5, wrist extension 3+/5, and intrinsics 2/5 Right lower extremity: Hip flexion, knee extension 2+/5, ankle dorsiflexion 4/5 Left lower extremity: Hip flexion, knee extension 3 -/5, ankle dorsiflexion  4/5 Sensation to light touch diminished distal to elbows and knees     Assessment/Plan: 1. Functional deficits which require 3+ hours per day of interdisciplinary therapy in a comprehensive inpatient rehab setting.  Physiatrist is providing close team supervision and 24 hour management of active medical problems listed below.  Physiatrist and rehab team continue to assess barriers to discharge/monitor patient progress toward functional and medical goals  Care Tool:  Bathing    Body parts bathed by patient: Right arm,Left arm,Chest,Abdomen,Right upper leg,Left upper leg,Face   Body parts bathed by helper: Buttocks,Right lower leg,Left lower leg,Front perineal area     Bathing assist Assist Level: Moderate Assistance - Patient 50 - 74%     Upper Body Dressing/Undressing Upper body dressing   What is the patient wearing?: Pull over shirt    Upper body assist Assist Level: Supervision/Verbal cueing    Lower Body Dressing/Undressing Lower body dressing      What is the patient wearing?: Incontinence brief,Pants     Lower body assist Assist for lower body dressing: Total Assistance - Patient < 25%     Toileting Toileting    Toileting assist Assist for toileting: Total Assistance - Patient < 25%  Transfers Chair/bed transfer  Transfers assist     Chair/bed transfer assist level: Maximal Assistance - Patient 25 - 49% (stand pivot with RW)     Locomotion Ambulation   Ambulation assist   Ambulation activity did not occur: Safety/medical concerns  Assist level: Moderate Assistance - Patient 50 - 74% Assistive device: Walker-rolling Max distance: 28   Walk 10 feet activity   Assist  Walk 10 feet activity did not occur: Safety/medical concerns  Assist level: Moderate Assistance - Patient - 50 - 74% Assistive device: Walker-rolling   Walk 50 feet activity   Assist Walk 50 feet with 2 turns activity did not occur: Safety/medical concerns          Walk 150 feet activity   Assist Walk 150 feet activity did not occur: Safety/medical concerns         Walk 10 feet on uneven surface  activity   Assist Walk 10 feet on uneven surfaces activity did not occur: Safety/medical concerns         Wheelchair     Assist Will patient use wheelchair at discharge?: Yes Type of Wheelchair: Manual    Wheelchair assist level: Supervision/Verbal cueing Max wheelchair distance: 125'    Wheelchair 50 feet with 2 turns activity    Assist        Assist Level: Supervision/Verbal cueing   Wheelchair 150 feet activity     Assist      Assist Level: Minimal Assistance - Patient > 75%   Blood pressure 111/68, pulse 96, temperature 98 F (36.7 C), temperature source Oral, resp. rate 17, height 5\' 7"  (1.702 m), weight 81.4 kg, SpO2 100 %.  Medical Problem List and Plan: 1.  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             -patient may shower             -ELOS/Goals: 14 to 17 days/supervision/min a            Continue CIR 2.  Antithrombotics:  -DVT/anticoagulation:  Pharmaceutical: Continue Lovenox  2/17- Very high risk of DVT due to SCI-will need 2-3 months of Lovenox per CHEST guidelines.   2/20: discussed with patient that vascular ultrasound is not available on weekends but that we can order upper extremity ultrasounds to assess for clots on Monday 2/21- based on exam, is B/L- above and below elbow itself- loks more like allergic rxn, but not hives than DVT- if gets worse, will order Dopplers  2/22- swelling better- was due to  med rxn.              -antiplatelet therapy: N/A 3. Pain Management: Continue Oxycodone prn.  2/21- pt reports pain is doing a little better- con't regimen  2/24- nerve pain still an issue, but had swelling with Lyrica, even at 75 mg TID-  Had to decrease to 25 mg TID, so now not that  helpful. Will add Duloxetine 30 mg QHS for now and if tolerate,d increase dose.  2/26- no side effects and pain doing a little better-con't regimen             Monitor with increased exertion, particularly neuropathic pain 4. Mood: LCSW to follow for evaluation and support.              -antipsychotic agents: N/A 5. Neuropsych: This patient is capable of making decisions on her own behalf. 6. Skin/Wound Care:  Monitor  incision for healing.  Added protein supplement to help promote healing.  7. Fluids/Electrolytes/Nutrition: Monitor I/Os.              Monitor CMP 8. ABLA:              Monitor CBC 9. Hypokalemia/Hypomagnesemia: Has resolved with brief supplementation.              2/22- K+ 4.0 and last Mg 1.9- con't regimen 10. Macrocytosis/Polysubstance abuse:              Vitamin B12/folate levels ordered.   217- counseling given about polysubstance abuse 11. Neurogenic bowel and constipation: KUB ordered to determine stool burden.  2/17- moderate R sided stool burden with a lot of contrast left in colon- will order bowel program with dig stim nightly and con't Miralax BID for now- she has no control of stool per pt.   2/18- had 5+ BMs last night- feels cleaned out- now- kept saying had more in her- but now feels "great'.   2/21- having BMs with bowel program- will ask nursing to get done by 9pm, if possible  2/25- per pt, had BM on toilet last night- not clear if after bowel program- con't regimen and monitor  2/26- no suppository last night, since nursing decided with multiple BMs yesterday to only do dig stim- no results, but small BM on toilet this AM. COn't bowel program nightly 12. Neurogenic bladder  2/17- PVRs 0-100 cc so far- not on Flomax- con't regimen for now- has no control of bladder, of note.   2/24- added Myrbetriq 25 mg daily- no improvement so far- still wet all the time- basically zero PVRs- so trying timed voiding q2 hours.  2/25- no improvement so far with Myrbetriq and  had (+) high PVRs yesterday -will stop Myrbetriq and do Flomax 0.4 mg qsupper and send to Urology outpt.   2/26- Flomax already helping bladder- less frequent urination and a little more control- con't Regimen  13. Neuropathic pain  2/17- start Lyrica 50 mg TID for nerve pain- hopefully will help "being on fire".   2/18- slightly better today- con't regimen  2/21- Increased Lyrica to 75 mg TID since no side effects of meds? But the arm swelling could be from Lyrica?  2/22- improved with decrease in lyrica dose.  2/24- Add Duloxetine 30 mg QHS for nerve pain  2/26- no side effects- con't Duloxetine- will increase Tuesday 3/1  14. Spasticity  2/17- will start Baclofen 5 mg QID for muscle tightness/spasms.     2/24- spasms well controlled now- con't Baclofen  2/25- spasms controlled, but getting a little tighter- will con't baclofen and con't to monitor 15. Dry skin: Eucerin ordered 16. B/L elbow/arm swelling  2/21- refused a medicine last night- could be cause of arm swelling. Will look into this. Held Lyrica last night- and swelling better- will d/w pt- asked her to refuse it today.    2/22- Lyrica decreased to 25 mg TID- swelling better 17. Dysphagia  D2 thins diet  2/22- con't regimen until upgraded by SLP.  18. Urinary urgency/frequency  2/22- will try Myrbetriq 25 mg QHS for urinary frequency- will also check PVRs with addition of new meds, so not retaining, hopefully.   2/24- as per Neurogenic bladder  2/26- added Flomax yesterday- working [facility] better- con't Flomax  19. LE edema  2/25- will start TEDs 8am to 6pm daily for LE edema.   LOS: 10 days A FACE TO FACE EVALUATION WAS  PERFORMED  Maddax Palinkas 07/21/2020, 4:09 PM

## 2020-07-21 NOTE — Plan of Care (Signed)
  Problem: Consults Goal: RH SPINAL CORD INJURY PATIENT EDUCATION Description:  See Patient Education module for education specifics.  Outcome: Progressing Goal: Skin Care Protocol Initiated - if Braden Score 18 or less Description: If consults are not indicated, leave blank or document N/A Outcome: Progressing   Problem: SCI BOWEL ELIMINATION Goal: RH STG MANAGE BOWEL WITH ASSISTANCE Description: STG Manage Bowel with Mod I Assistance. Outcome: Progressing Goal: RH STG SCI MANAGE BOWEL WITH MEDICATION WITH ASSISTANCE Description: STG SCI Manage bowel with medication with Mod I assistance. Outcome: Progressing   Problem: SCI BLADDER ELIMINATION Goal: RH STG MANAGE BLADDER WITH ASSISTANCE Description: STG Manage Bladder With Mod I Assistance Outcome: Progressing   Problem: RH SKIN INTEGRITY Goal: RH STG ABLE TO PERFORM INCISION/WOUND CARE W/ASSISTANCE Description: STG Able To Perform Incision/Wound Care With Mod I Assistance. Outcome: Progressing   Problem: RH SAFETY Goal: RH STG ADHERE TO SAFETY PRECAUTIONS W/ASSISTANCE/DEVICE Description: STG Adhere to Safety Precautions With Mod I Assistance/Device. Outcome: Progressing   Problem: RH PAIN MANAGEMENT Goal: RH STG PAIN MANAGED AT OR BELOW PT'S PAIN GOAL Description: <3 on a 0-10 pain scale. Outcome: Progressing   Problem: RH KNOWLEDGE DEFICIT SCI Goal: RH STG INCREASE KNOWLEDGE OF SELF CARE AFTER SCI Description: Patient will demonstrate knowledge of medication management, bowel and bladder care, skin and wound care, dietary management with educational materials and handouts provided by staff. Outcome: Progressing

## 2020-07-21 NOTE — Progress Notes (Signed)
Bowel program successful. Jay Schlichter, LPN

## 2020-07-22 LAB — VITAMIN B1: Vitamin B1 (Thiamine): 191.6 nmol/L (ref 66.5–200.0)

## 2020-07-22 MED ORDER — WHITE PETROLATUM EX OINT
TOPICAL_OINTMENT | Freq: Two times a day (BID) | CUTANEOUS | Status: DC
  Administered 2020-07-22 – 2020-08-10 (×27): 0.2 via TOPICAL
  Administered 2020-08-11: 1 via TOPICAL
  Administered 2020-08-11 – 2020-08-17 (×11): 0.2 via TOPICAL
  Filled 2020-07-22 (×19): qty 28.35

## 2020-07-22 NOTE — Plan of Care (Signed)
  Problem: Consults Goal: RH SPINAL CORD INJURY PATIENT EDUCATION Description:  See Patient Education module for education specifics.  Outcome: Progressing Goal: Skin Care Protocol Initiated - if Braden Score 18 or less Description: If consults are not indicated, leave blank or document N/A Outcome: Progressing   Problem: SCI BOWEL ELIMINATION Goal: RH STG MANAGE BOWEL WITH ASSISTANCE Description: STG Manage Bowel with Mod I Assistance. Outcome: Progressing Goal: RH STG SCI MANAGE BOWEL WITH MEDICATION WITH ASSISTANCE Description: STG SCI Manage bowel with medication with Mod I assistance. Outcome: Progressing   Problem: SCI BLADDER ELIMINATION Goal: RH STG MANAGE BLADDER WITH ASSISTANCE Description: STG Manage Bladder With Mod I Assistance Outcome: Progressing   Problem: RH SAFETY Goal: RH STG ADHERE TO SAFETY PRECAUTIONS W/ASSISTANCE/DEVICE Description: STG Adhere to Safety Precautions With Mod I Assistance/Device. Outcome: Progressing   Problem: RH PAIN MANAGEMENT Goal: RH STG PAIN MANAGED AT OR BELOW PT'S PAIN GOAL Description: <3 on a 0-10 pain scale. Outcome: Progressing   Problem: RH KNOWLEDGE DEFICIT SCI Goal: RH STG INCREASE KNOWLEDGE OF SELF CARE AFTER SCI Description: Patient will demonstrate knowledge of medication management, bowel and bladder care, skin and wound care, dietary management with educational materials and handouts provided by staff. Outcome: Progressing   

## 2020-07-22 NOTE — Progress Notes (Signed)
Speech Language Pathology Daily Session Note  Patient Details  Name: Yvette Phillips MRN: [id number] Date of Birth: 1960/12/16  Today's Date: 07/22/2020 SLP Individual Time: 0920-0950 SLP Individual Time Calculation (min): 30 min  Short Term Goals: Week 2: SLP Short Term Goal 1 (Week 2): Pt will consume dys 2 textures and thin liquids with supervision A verbal cues to utilize swallow strategies. SLP Short Term Goal 2 (Week 2): Pt will consume trials of dys 3 textures with minimal overt s/s aspiration noted x3 prior to diet advancement.  Skilled Therapeutic Interventions:  Pt was seen for skilled ST targeting dysphagia goals.  SLP facilitated the session with trials of dys 3 textures to continue working towards diet advancement.  Prior to trials pt was able to verbalize her swallowing precautions with supervision question cues.  She then implemented strategies during snack with supervision cues.  Pt had gentle throat clearing which she attributed to sensed residue of crackers.  No other overt s/s of aspiration were evident with solids or liquids.  Pt agreeable to ongoing trials of advanced textures prior to diet progression.  Pt was left in bed with bed alarm set and call bell within reach.  Continue per current plan of care.    Pain Pain Assessment Pain Scale: 0-10 Pain Score: 0-No pain  Therapy/Group: Individual Therapy  Yvette Phillips, Melanee Spry 07/22/2020, 9:54 AM

## 2020-07-22 NOTE — Progress Notes (Signed)
Occupational Therapy Session Note  Patient Details  Name: Malley Hauter MRN: [id number] Date of Birth: [birth year]  Today's Date: 07/22/2020 OT Group Time: 1130-1200 OT Group Time Calculation (min): 30 min 30 minutes missed  Skilled Therapeutic Interventions/Progress Updates:    Pt engaged in therapeutic w/c level dance group focusing on patient choice, UE/LE strengthening, salience, activity tolerance, and social participation. Pt was guided through various dance-based exercises involving UEs/LEs and trunk. All music was selected by group members. Emphasis placed on UE/LE NMR. Pt arrived late to group today due to nursing care. She participated well while seated with vcs to increase amplitude of UB/LB movement. Her affect remained bright and cheerful throughout. At end of session student PT returned pt to her room.    Therapy Documentation Precautions:  Precautions Precautions: Fall,Cervical Precaution Comments: s/p ACDF C4-5 (no cervical brace needed per acute), BUE/LE weakness Restrictions Weight Bearing Restrictions: No Pain: no s/s pain during tx Pain Assessment Pain Scale: 0-10 Pain Score: 0-No pain ADL: ADL Equipment Provided: Feeding equipment Eating: Not assessed (not fully assessed, does have built up handles and able to simulate) Grooming: Moderate assistance Upper Body Bathing: Minimal assistance Where Assessed-Upper Body Bathing: Sitting at sink Lower Body Bathing: Maximal assistance Where Assessed-Lower Body Bathing: Sitting at sink,Standing at sink Upper Body Dressing: Moderate assistance Lower Body Dressing: Dependent Where Assessed-Lower Body Dressing: Sitting at sink,Standing at sink Toileting: Not assessed Toilet Transfer: Maximal assistance      Therapy/Group: Group Therapy  Honorio Devol A Shequila Neglia 07/22/2020, 12:34 PM

## 2020-07-22 NOTE — Progress Notes (Signed)
Physical Therapy Session Note  Patient Details  Name: Yvette Phillips MRN: [id number] Date of Birth: 08-Aug-1960  Today's Date: 07/22/2020 PT Individual Time: 3893-7342 PT Individual Time Calculation (min): 40 min   Short Term Goals: Week 2:  PT Short Term Goal 1 (Week 2): Pt will complete bed mobility with min A consistently PT Short Term Goal 2 (Week 2): Pt will perform transfers with LRAD and min A consistently PT Short Term Goal 3 (Week 2): Pt will ambulate x 50 ft with LRAD and mod A  Skilled Therapeutic Interventions/Progress Updates:    Pt received seated in w/c in room, agreeable to PT session. Pt reports soreness in her shoulders this date, not rated and declines intervention. Sit to stand with mod A to RW this date. Stand pivot transfer w/c to/from mat table with RW and mod A. Sit to stand x 5 reps from mat table with focus on eccentric control when sitting with RW and min to mod A. Standing mini-squats 2 x 10 reps with RW and min A for balance with focus on decreased reliance on BUE on RW. Ambulation 2 x 20 ft with RW and min A for balance with close w/c follow for safety, narrow BOS and one instance of knees buckling, fall prevented with min A and use of RW. Manual w/c propulsion x 150 ft with use of BUE at Supervision level with max cueing needed for navigation of turns. Pt requests to return to bed at end of session. Stand pivot transfer w/c to bed with RW and mod A. Sit to supine mod A for BLE management. Pt left semi-reclined in bed with needs in reach, bed alarm in place at end of session.  Therapy Documentation Precautions:  Precautions Precautions: Fall,Cervical Precaution Comments: s/p ACDF C4-5 (no cervical brace needed per acute), BUE/LE weakness Restrictions Weight Bearing Restrictions: No   Therapy/Group: Individual Therapy   Peter Congo, PT, DPT  07/22/2020, 5:30 PM

## 2020-07-22 NOTE — Progress Notes (Signed)
PROGRESS NOTE   Subjective/Complaints:  Urine back to being "crazy"- explained it usually takes days for Flomax to help.  Ok otherwise.  Per nursing, having difficulty with feet/calluses and wondering if there's anything else to help.   ROS:  Pt denies SOB, abd pain, CP, N/V/C/D, and vision changes   Objective:   No results found. No results for input(s): WBC, HGB, HCT, PLT in the last 72 hours. No results for input(s): NA, K, CL, CO2, GLUCOSE, BUN, CREATININE, CALCIUM in the last 72 hours.  Intake/Output Summary (Last 24 hours) at 07/22/2020 1252 Last data filed at 07/22/2020 0749 Gross per 24 hour  Intake 602 ml  Output --  Net 602 ml        Physical Exam: Vital Signs Blood pressure 104/63, pulse 89, temperature 97.9 F (36.6 C), temperature source Oral, resp. rate 16, height 5\' 7"  (1.702 m), weight 81.4 kg, SpO2 98 %.    General: awake, alert, appropriate, NAD- sitting up in bed HENT: conjugate gaze; oropharynx moist CV: regular rate; no JVD Pulmonary: CTA B/L; no W/R/R- good air movement GI: soft, NT, ND, (+)BS Psychiatric: appropriate- bright affct Neurological: Ox3 Skin: intact, but 2-3+ LE edema B/L- also feet are very callused with cracking severe Musculoskeletal:     Cervical back: Normal range of motion and neck supple.     Comments: Left knee pain with flexion.  Left knee with tenderness and mild edema Skin:    General: Skin is warm and dry.     Comments: Tinea pedis bilaterally with cracked right heel and thick toe nails.    Neurological: Ox3 Strong brisk hoffman's B/L- MAS of 1+ in UEs and trace to 1 in LEs.  Motor: Bilateral upper extremities: Shoulder abduction, elbow flexion/extension 4/5, wrist extension 3+/5, and intrinsics 2/5 Right lower extremity: Hip flexion, knee extension 2+/5, ankle dorsiflexion 4/5 Left lower extremity: Hip flexion, knee extension 3 -/5, ankle dorsiflexion  4/5 Sensation to light touch diminished distal to elbows and knees     Assessment/Plan: 1. Functional deficits which require 3+ hours per day of interdisciplinary therapy in a comprehensive inpatient rehab setting.  Physiatrist is providing close team supervision and 24 hour management of active medical problems listed below.  Physiatrist and rehab team continue to assess barriers to discharge/monitor patient progress toward functional and medical goals  Care Tool:  Bathing    Body parts bathed by patient: Right arm,Left arm,Chest,Abdomen,Right upper leg,Left upper leg,Face   Body parts bathed by helper: Buttocks,Right lower leg,Left lower leg,Front perineal area     Bathing assist Assist Level: Moderate Assistance - Patient 50 - 74%     Upper Body Dressing/Undressing Upper body dressing   What is the patient wearing?: Pull over shirt    Upper body assist Assist Level: Supervision/Verbal cueing    Lower Body Dressing/Undressing Lower body dressing      What is the patient wearing?: Incontinence brief,Pants     Lower body assist Assist for lower body dressing: Total Assistance - Patient < 25%     Toileting Toileting    Toileting assist Assist for toileting: Total Assistance - Patient < 25%     Transfers Chair/bed transfer  Transfers assist     Chair/bed transfer assist level: Maximal Assistance - Patient 25 - 49% (stand pivot with RW)     Locomotion Ambulation   Ambulation assist   Ambulation activity did not occur: Safety/medical concerns  Assist level: Moderate Assistance - Patient 50 - 74% Assistive device: Walker-rolling Max distance: 28   Walk 10 feet activity   Assist  Walk 10 feet activity did not occur: Safety/medical concerns  Assist level: Moderate Assistance - Patient - 50 - 74% Assistive device: Walker-rolling   Walk 50 feet activity   Assist Walk 50 feet with 2 turns activity did not occur: Safety/medical concerns          Walk 150 feet activity   Assist Walk 150 feet activity did not occur: Safety/medical concerns         Walk 10 feet on uneven surface  activity   Assist Walk 10 feet on uneven surfaces activity did not occur: Safety/medical concerns         Wheelchair     Assist Will patient use wheelchair at discharge?: Yes Type of Wheelchair: Manual    Wheelchair assist level: Supervision/Verbal cueing Max wheelchair distance: 125'    Wheelchair 50 feet with 2 turns activity    Assist        Assist Level: Supervision/Verbal cueing   Wheelchair 150 feet activity     Assist      Assist Level: Minimal Assistance - Patient > 75%   Blood pressure 104/63, pulse 89, temperature 97.9 F (36.6 C), temperature source Oral, resp. rate 16, height 5\' 7"  (1.702 m), weight 81.4 kg, SpO2 98 %.  Medical Problem List and Plan: 1.  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             -patient may shower             -ELOS/Goals: 14 to 17 days/supervision/min a            Continue CIR 2.  Antithrombotics:  -DVT/anticoagulation:  Pharmaceutical: Continue Lovenox  2/17- Very high risk of DVT due to SCI-will need 2-3 months of Lovenox per CHEST guidelines.   2/20: discussed with patient that vascular ultrasound is not available on weekends but that we can order upper extremity ultrasounds to assess for clots on Monday 2/21- based on exam, is B/L- above and below elbow itself- loks more like allergic rxn, but not hives than DVT- if gets worse, will order Dopplers  2/22- swelling better- was due to  med rxn.   2/27- will need 2-3 months of Lovenox, depending on if walking 150 ft when discharged             -antiplatelet therapy: N/A 3. Pain Management: Continue Oxycodone prn.  2/21- pt reports pain is doing a little better- con't regimen  2/24- nerve pain still an issue, but had  swelling with Lyrica, even at 75 mg TID-  Had to decrease to 25 mg TID, so now not that helpful. Will add Duloxetine 30 mg QHS for now and if tolerate,d increase dose.  2/26- no side effects and pain doing a little better-con't regimen  2/27- no side effects- will increase next Tuesday or so.              Monitor with increased exertion, particularly neuropathic pain 4. Mood: LCSW to follow for evaluation and support.              -  antipsychotic agents: N/A 5. Neuropsych: This patient is capable of making decisions on her own behalf. 6. Skin/Wound Care:  Monitor incision for healing.  Added protein supplement to help promote healing.  7. Fluids/Electrolytes/Nutrition: Monitor I/Os.              Monitor CMP 8. ABLA:              Monitor CBC 9. Hypokalemia/Hypomagnesemia: Has resolved with brief supplementation.              2/22- K+ 4.0 and last Mg 1.9- con't regimen  2/27- labs in AM 10. Macrocytosis/Polysubstance abuse:              Vitamin B12/folate levels ordered.   217- counseling given about polysubstance abuse 11. Neurogenic bowel and constipation: KUB ordered to determine stool burden.  2/17- moderate R sided stool burden with a lot of contrast left in colon- will order bowel program with dig stim nightly and con't Miralax BID for now- she has no control of stool per pt.   2/18- had 5+ BMs last night- feels cleaned out- now- kept saying had more in her- but now feels "great'.   2/21- having BMs with bowel program- will ask nursing to get done by 9pm, if possible  2/25- per pt, had BM on toilet last night- not clear if after bowel program- con't regimen and monitor  2/26- no suppository last night, since nursing decided with multiple BMs yesterday to only do dig stim- no results, but small BM on toilet this AM. COn't bowel program nightly  2/27- had bowel program last night- was successful per note- had results.  12. Neurogenic bladder  2/17- PVRs 0-100 cc so far- not on Flomax-  con't regimen for now- has no control of bladder, of note.   2/24- added Myrbetriq 25 mg daily- no improvement so far- still wet all the time- basically zero PVRs- so trying timed voiding q2 hours.  2/25- no improvement so far with Myrbetriq and had (+) high PVRs yesterday -will stop Myrbetriq and do Flomax 0.4 mg qsupper and send to Urology outpt.   2/26- Flomax already helping bladder- less frequent urination and a little more control- con't Regimen  2/27- back to being "crazy" per pt- going very frequently- will con't and if no better Tuesday, will increase to 0.8 mg nightly  13. Neuropathic pain  2/17- start Lyrica 50 mg TID for nerve pain- hopefully will help "being on fire".   2/18- slightly better today- con't regimen  2/21- Increased Lyrica to 75 mg TID since no side effects of meds? But the arm swelling could be from Lyrica?  2/22- improved with decrease in lyrica dose.  2/24- Add Duloxetine 30 mg QHS for nerve pain  2/26- no side effects- con't Duloxetine- will increase Tuesday 3/1  14. Spasticity  2/17- will start Baclofen 5 mg QID for muscle tightness/spasms.     2/24- spasms well controlled now- con't Baclofen  2/25- spasms controlled, but getting a little tighter- will con't baclofen and con't to monitor 15. Dry skin: Eucerin ordered 16. B/L elbow/arm swelling  2/21- refused a medicine last night- could be cause of arm swelling. Will look into this. Held Lyrica last night- and swelling better- will d/w pt- asked her to refuse it today.    2/22- Lyrica decreased to 25 mg TID- swelling better 17. Dysphagia  D2 thins diet  2/22- con't regimen until upgraded by SLP.  18. Urinary urgency/frequency  2/22- will try  Myrbetriq 25 mg QHS for urinary frequency- will also check PVRs with addition of new meds, so not retaining, hopefully.   2/24- as per Neurogenic bladder  2/26- added Flomax yesterday- working [facility] better- con't Flomax  19. LE edema  2/25- will start TEDs 8am to 6pm  daily for LE edema. 20. Severe calluses on Feet  2/27- add order that will have feet warmed with warm wet washcloth, then lotion applied, then vaseline over that BID- to help cracks/calluses.    LOS: 11 days A FACE TO FACE EVALUATION WAS PERFORMED  Yaret Hush 07/22/2020, 12:52 PM

## 2020-07-22 NOTE — Plan of Care (Signed)
  Problem: Consults Goal: RH SPINAL CORD INJURY PATIENT EDUCATION Description:  See Patient Education module for education specifics.  Outcome: Progressing Goal: Skin Care Protocol Initiated - if Braden Score 18 or less Description: If consults are not indicated, leave blank or document N/A Outcome: Progressing   Problem: SCI BOWEL ELIMINATION Goal: RH STG MANAGE BOWEL WITH ASSISTANCE Description: STG Manage Bowel with Mod I Assistance. Outcome: Progressing Goal: RH STG SCI MANAGE BOWEL WITH MEDICATION WITH ASSISTANCE Description: STG SCI Manage bowel with medication with Mod I assistance. Outcome: Progressing   Problem: SCI BLADDER ELIMINATION Goal: RH STG MANAGE BLADDER WITH ASSISTANCE Description: STG Manage Bladder With Mod I Assistance Outcome: Progressing   Problem: RH SAFETY Goal: RH STG ADHERE TO SAFETY PRECAUTIONS W/ASSISTANCE/DEVICE Description: STG Adhere to Safety Precautions With Mod I Assistance/Device. Outcome: Progressing   Problem: RH PAIN MANAGEMENT Goal: RH STG PAIN MANAGED AT OR BELOW PT'S PAIN GOAL Description: <3 on a 0-10 pain scale. Outcome: Progressing   Problem: RH KNOWLEDGE DEFICIT SCI Goal: RH STG INCREASE KNOWLEDGE OF SELF CARE AFTER SCI Description: Patient will demonstrate knowledge of medication management, bowel and bladder care, skin and wound care, dietary management with educational materials and handouts provided by staff. Outcome: Progressing

## 2020-07-22 NOTE — Progress Notes (Signed)
Bowel program complete. Pt assisted to toilet using as stedy. Pt had a large mushy BM after dulcolax suppository. Pt tolerated well.   Marylu Lund, RN

## 2020-07-23 LAB — BASIC METABOLIC PANEL
Anion gap: 8 (ref 5–15)
BUN: 13 mg/dL (ref 8–23)
CO2: 28 mmol/L (ref 22–32)
Calcium: 9.7 mg/dL (ref 8.9–10.3)
Chloride: 101 mmol/L (ref 98–111)
Creatinine, Ser: 0.76 mg/dL (ref 0.44–1.00)
GFR, Estimated: >60 mL/min (ref >60)
Glucose, Bld: 86 mg/dL (ref 70–99)
Potassium: 4.2 mmol/L (ref 3.5–5.1)
Sodium: 137 mmol/L (ref 135–145)

## 2020-07-23 LAB — CBC
HCT: 37.8 % (ref 36.0–46.0)
Hemoglobin: 12.3 g/dL (ref 12.0–15.0)
MCH: 34.6 pg — ABNORMAL HIGH (ref 26.0–34.0)
MCHC: 32.5 g/dL (ref 30.0–36.0)
MCV: 106.5 fL — ABNORMAL HIGH (ref 80.0–100.0)
Platelets: 307 10*3/uL (ref 150–400)
RBC: 3.55 MIL/uL — ABNORMAL LOW (ref 3.87–5.11)
RDW: 13 % (ref 11.5–15.5)
WBC: 6.3 10*3/uL (ref 4.0–10.5)
nRBC: 0 % (ref 0.0–0.2)

## 2020-07-23 NOTE — Progress Notes (Signed)
PROGRESS NOTE   Subjective/Complaints: C/o of shoulder pain- says Tylenol helps Discussed her dry skin- she has Eucerin She has no other complaints this morning Labs are stable  ROS: Pt denies SOB, abd pain, CP, N/V/C/D, and vision changes   Objective:   No results found. Recent Labs    07/23/20 0511  WBC 6.3  HGB 12.3  HCT 37.8  PLT 307   Recent Labs    07/23/20 0511  NA 137  K 4.2  CL 101  CO2 28  GLUCOSE 86  BUN 13  CREATININE 0.76  CALCIUM 9.7    Intake/Output Summary (Last 24 hours) at 07/23/2020 1308 Last data filed at 07/22/2020 1853 Gross per 24 hour  Intake 220 ml  Output --  Net 220 ml        Physical Exam: Vital Signs Blood pressure 117/69, pulse 89, temperature 98.1 F (36.7 C), temperature source Oral, resp. rate 15, height 5\' 7"  (1.702 m), weight 81.4 kg, SpO2 100 %.   Gen: no distress, normal appearing HEENT: oral mucosa pink and moist, NCAT Cardio: Reg rate Chest: normal effort, normal rate of breathing Abd: soft, non-distended Ext: no edema Psychiatric: appropriate- bright affct Neurological: Ox3 Skin: intact, but 2-3+ LE edema B/L- also feet are very callused with cracking severe Musculoskeletal:     Cervical back: Normal range of motion and neck supple.     Comments: Left knee pain with flexion.  Left knee with tenderness and mild edema Skin:    General: Skin is warm and dry.     Comments: Tinea pedis bilaterally with cracked right heel and thick toe nails.    Neurological: Ox3 Strong brisk hoffman's B/L- MAS of 1+ in UEs and trace to 1 in LEs.  Motor: Bilateral upper extremities: Shoulder abduction, elbow flexion/extension 4/5, wrist extension 3+/5, and intrinsics 2/5 Right lower extremity: Hip flexion, knee extension 2+/5, ankle dorsiflexion 4/5 Left lower extremity: Hip flexion, knee extension 3 -/5, ankle dorsiflexion 4/5 Sensation to light touch diminished distal to  elbows and knees    Assessment/Plan: 1. Functional deficits which require 3+ hours per day of interdisciplinary therapy in a comprehensive inpatient rehab setting.  Physiatrist is providing close team supervision and 24 hour management of active medical problems listed below.  Physiatrist and rehab team continue to assess barriers to discharge/monitor patient progress toward functional and medical goals  Care Tool:  Bathing    Body parts bathed by patient: Right arm,Left arm,Chest,Abdomen,Right upper leg,Left upper leg,Face   Body parts bathed by helper: Buttocks,Right lower leg,Left lower leg,Front perineal area     Bathing assist Assist Level: Moderate Assistance - Patient 50 - 74%     Upper Body Dressing/Undressing Upper body dressing   What is the patient wearing?: Pull over shirt    Upper body assist Assist Level: Supervision/Verbal cueing    Lower Body Dressing/Undressing Lower body dressing      What is the patient wearing?: Incontinence brief,Pants     Lower body assist Assist for lower body dressing: Total Assistance - Patient < 25%     Toileting Toileting    Toileting assist Assist for toileting: Total Assistance - Patient < 25%  Transfers Chair/bed transfer  Transfers assist     Chair/bed transfer assist level: Minimal Assistance - Patient > 75%     Locomotion Ambulation   Ambulation assist   Ambulation activity did not occur: Safety/medical concerns  Assist level: Minimal Assistance - Patient > 75% Assistive device: Walker-rolling Max distance: 30   Walk 10 feet activity   Assist  Walk 10 feet activity did not occur: Safety/medical concerns  Assist level: Minimal Assistance - Patient > 75% Assistive device: Walker-rolling   Walk 50 feet activity   Assist Walk 50 feet with 2 turns activity did not occur: Safety/medical concerns         Walk 150 feet activity   Assist Walk 150 feet activity did not occur:  Safety/medical concerns         Walk 10 feet on uneven surface  activity   Assist Walk 10 feet on uneven surfaces activity did not occur: Safety/medical concerns         Wheelchair     Assist Will patient use wheelchair at discharge?: Yes Type of Wheelchair: Manual    Wheelchair assist level: Supervision/Verbal cueing Max wheelchair distance: 150'    Wheelchair 50 feet with 2 turns activity    Assist        Assist Level: Supervision/Verbal cueing   Wheelchair 150 feet activity     Assist      Assist Level: Supervision/Verbal cueing   Blood pressure 117/69, pulse 89, temperature 98.1 F (36.7 C), temperature source Oral, resp. rate 15, height 5\' 7"  (1.702 m), weight 81.4 kg, SpO2 100 %.  Medical Problem List and Plan: 1.  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             -patient may shower             -ELOS/Goals: 14 to 17 days/supervision/min a            Continue CIR 2.  Antithrombotics:  -DVT/anticoagulation:  Pharmaceutical: Continue Lovenox  2/17- Very high risk of DVT due to SCI-will need 2-3 months of Lovenox per CHEST guidelines.  2/20: discussed with patient that vascular ultrasound is not available on weekends but that we can order upper extremity ultrasounds to assess for clots on Monday 2/21- based on exam, is B/L- above and below elbow itself- loks more like allergic rxn, but not hives than DVT- if gets worse, will order Dopplers  2/22- swelling better- was due to  med rxn.   2/27- will need 2-3 months of Lovenox, depending on if walking 150 ft when discharged             -antiplatelet therapy: N/A 3. Pain Management: Continue Oxycodone prn.  2/21- pt reports pain is doing a little better- con't regimen  2/24- nerve pain still an issue, but had swelling with Lyrica, even at 75 mg TID-  Had to decrease to 25 mg TID, so now not that  helpful. Will add Duloxetine 30 mg QHS for now and if tolerate,d increase dose.  2/26- no side effects and pain doing a little better-con't regimen  2/27- no side effects- will increase next Tuesday or so.              Monitor with increased exertion, particularly neuropathic pain 4. Mood: LCSW to follow for evaluation and support.              -antipsychotic agents: N/A 5. Neuropsych: This  patient is capable of making decisions on her own behalf. 6. Skin/Wound Care:  Monitor incision for healing.  Added protein supplement to help promote healing.  7. Fluids/Electrolytes/Nutrition: Monitor I/Os.              Monitor CMP 8. ABLA:              Monitor CBC 9. Hypokalemia/Hypomagnesemia: Has resolved with brief supplementation.              2/22- K+ 4.0 and last Mg 1.9- con't regimen  2/28: K+ reviewed, normalized to 4.2 10. Macrocytosis/Polysubstance abuse:              Vitamin B12/folate levels ordered.   217- counseling given about polysubstance abuse 11. Neurogenic bowel and constipation: KUB ordered to determine stool burden.  2/17- moderate R sided stool burden with a lot of contrast left in colon- will order bowel program with dig stim nightly and con't Miralax BID for now- she has no control of stool per pt.   2/18- had 5+ BMs last night- feels cleaned out- now- kept saying had more in her- but now feels "great'.   2/21- having BMs with bowel program- will ask nursing to get done by 9pm, if possible  2/25- per pt, had BM on toilet last night- not clear if after bowel program- con't regimen and monitor  2/26- no suppository last night, since nursing decided with multiple BMs yesterday to only do dig stim- no results, but small BM on toilet this AM. COn't bowel program nightly  2/27- had bowel program last night- was successful per note- had results.  12. Neurogenic bladder  2/17- PVRs 0-100 cc so far- not on Flomax- con't regimen for now- has no control of bladder, of note.   2/24-  added Myrbetriq 25 mg daily- no improvement so far- still wet all the time- basically zero PVRs- so trying timed voiding q2 hours.  2/25- no improvement so far with Myrbetriq and had (+) high PVRs yesterday -will stop Myrbetriq and do Flomax 0.4 mg qsupper and send to Urology outpt.   2/26- Flomax already helping bladder- less frequent urination and a little more control- con't Regimen  2/27- back to being "crazy" per pt- going very frequently- will con't and if no better Tuesday, will increase to 0.8 mg nightly  13. Neuropathic pain  2/17- start Lyrica 50 mg TID for nerve pain- hopefully will help "being on fire".   2/18- slightly better today- con't regimen  2/21- Increased Lyrica to 75 mg TID since no side effects of meds? But the arm swelling could be from Lyrica?  2/22- improved with decrease in lyrica dose.  2/24- Add Duloxetine 30 mg QHS for nerve pain  2/26- no side effects- con't Duloxetine- will increase Tuesday 3/1  14. Spasticity  2/17- will start Baclofen 5 mg QID for muscle tightness/spasms.     2/24- spasms well controlled now- con't Baclofen  2/25- spasms controlled, but getting a little tighter- will con't baclofen and con't to monitor 15. Dry skin: Eucerin ordered, encouraged use.  16. B/L elbow/arm swelling  2/21- refused a medicine last night- could be cause of arm swelling. Will look into this. Held Lyrica last night- and swelling better- will d/w pt- asked her to refuse it today.    2/22- Lyrica decreased to 25 mg TID- swelling better 17. Dysphagia  D2 thins diet  2/22- con't regimen until upgraded by SLP.  18. Urinary urgency/frequency  2/22- will try Myrbetriq  25 mg QHS for urinary frequency- will also check PVRs with addition of new meds, so not retaining, hopefully.   2/24- as per Neurogenic bladder  2/26- added Flomax yesterday- working [facility] better- con't Flomax   2/28: continue Flomax.  19. LE edema  2/25- will start TEDs 8am to 6pm daily for LE edema. 20.  Severe calluses on Feet  2/27- add order that will have feet warmed with warm wet washcloth, then lotion applied, then vaseline over that BID- to help cracks/calluses.    LOS: 12 days A FACE TO FACE EVALUATION WAS PERFORMED  Clint Bolder P Kenzlie Disch 07/23/2020, 1:08 PM

## 2020-07-23 NOTE — Progress Notes (Signed)
Speech Language Pathology Daily Session Note  Patient Details  Name: Yvette Phillips MRN: [id number] Date of Birth: 1961-04-29  Today's Date: 07/23/2020 SLP Individual Time: 1200-1258 SLP Individual Time Calculation (min): 58 min  Short Term Goals: Week 2: SLP Short Term Goal 1 (Week 2): Pt will consume dys 2 textures and thin liquids with supervision A verbal cues to utilize swallow strategies. SLP Short Term Goal 2 (Week 2): Pt will consume trials of dys 3 textures with minimal overt s/s aspiration noted x3 prior to diet advancement.  Skilled Therapeutic Interventions:Skilled ST services focused on swallow skills. SLP ordered dsy 3 trial tray for lunch, however textures arrived as dys 2 textures. Pt demonstrated recall of swallow strategies, reading from posted sign in room Mod I. Pt requested to watch soap opera during meal as typically routine and SLP agreed to assist in emulating home environment with added distraction. Pt demonstrated mod I use of following all solids with liquids, however required mod A verbal cues to accurately preform chin tuck and dry swallow with solids. Pt only demonstrated x2 coughs during meal once with liquids and once with solids despite poor ability to accurately follow swallow strategies with solids. Pt reports reduced sensation of "food feeling stuck", suggesting possible reduction in edema and needs for strict swallow strategies. SLP educated pt to complete swallow strategies accurately to reduce s/s aspiration and provided education reviewing most recent MBS slides. Pt agreed, however SLP believes pt will not likely be complaint at discharge. SLP continued to recommend dys 2 textures. Pt was left in room with call bell within reach and chair alarm set. SLP recommends to continue skilled services.     Pain Pain Assessment Pain Score: 0-No pain  Therapy/Group: Individual Therapy  Liba Hulsey  [facility] 07/23/2020, 3:19 PM

## 2020-07-23 NOTE — Progress Notes (Signed)
Physical Therapy Session Note  Patient Details  Name: Yvette Phillips MRN: [id number] Date of Birth: June 18, 1960  Today's Date: 07/23/2020 PT Individual Time: 1000-1100 PT Individual Time Calculation (min): 60 min   Short Term Goals: Week 2:  PT Short Term Goal 1 (Week 2): Pt will complete bed mobility with min A consistently PT Short Term Goal 2 (Week 2): Pt will perform transfers with LRAD and min A consistently PT Short Term Goal 3 (Week 2): Pt will ambulate x 50 ft with LRAD and mod A  Skilled Therapeutic Interventions/Progress Updates:    Pt reclined in bed on arrival, agreeable to therapy. Supine>EOB with min A for trunk control and VCs for technique. Pt changed shirt with supervision and minimal LOB. Dependent to thread legs, pt stood with RW with mod A to pull pants over hips. Stand pivot transfer to wc with RW and min A, propelled wc 2 x 150 ft with BUE and supervision. Required occasional cueing for steering. Stand pivot transfer with RW and min A wc<>nustep. Nu step x 8 min on level 4 at 60-70 spm. Gait x 20 ft, x 30 ft, x 25 ft with RW and min A. Pt demonstrates mild steppage gait with decreased step height. Pt returned to room and performed stand pivot transfer wc>recliner with min A and RW, pt left with chair alarm active and all needs in reach.    Therapy Documentation Precautions:  Precautions Precautions: Fall,Cervical Precaution Comments: s/p ACDF C4-5 (no cervical brace needed per acute), BUE/LE weakness Restrictions Weight Bearing Restrictions: No    Therapy/Group: Individual Therapy  Dyane Dustman, SPT 07/23/2020, 12:52 PM

## 2020-07-23 NOTE — Progress Notes (Signed)
Occupational Therapy Session Note  Patient Details  Name: Yvette Phillips MRN: [id number] Date of Birth: 09-26-1960  Today's Date: 07/23/2020 OT Individual Time: 1300-1358 OT Individual Time Calculation (min): 58 min   Short Term Goals: Week 2:  OT Short Term Goal 1 (Week 2): Pt will perform sit > stand at AD with no more than Mod A in prep for LB ADL OT Short Term Goal 2 (Week 2): Pt will perform BSC/toilet transfers with no more than Mod of 1 OT Short Term Goal 3 (Week 2): Pt will increase functional grip strength to perform oral hygiene w/ Supervision OT Short Term Goal 4 (Week 2): Pt will complete LB dressing with max A    Skilled Therapeutic Interventions/Progress Updates:    Pt greeted in the recliner via SLP handoff. Just finished lunch, no c/o pain. Pt was requesting to engage in bathing/dressing tasks during session. Mod A for sit<stand using RW where pt then ambulated a short distance to w/c placed near sink. Note that pt had 1 posterior LOB into the chair, stating her legs "gave out." Setup for UB bathing with pt able to use and squeeze out wash cloths unassisted. Min A to pull overhead shirt over trunk due to limited hand dexterity. Pt then reported needing to urgently void bladder. Min A for stand pivot<elevated toilet using grab bar. With the drop arm lowered, pt required Mod A to spread legs enough for her to complete perihygiene in the front. Min A for sit<stand where OT assisted with perihygiene in the back. Pt needed Total A to reach feet functionally during self care. Reacher education initiated when pt returned to the w/c. Pt required Mod A and cuing to thread LEs into pants using reacher due to [facility] + strength deficits in hands bilaterally. Max A to pull pants over hips in standing, note that pt had another posterior LOB and fell back into the chair, stating that her legs "gave out" again. Pt remained in the w/c at close of session, all needs within reach and safety belt fastened. Tx  focus placed on functional transfers, adaptive self care skills, UE NMR, and balance.   Therapy Documentation Precautions:  Precautions Precautions: Fall,Cervical Precaution Comments: s/p ACDF C4-5 (no cervical brace needed per acute), BUE/LE weakness Restrictions Weight Bearing Restrictions: No Vital Signs: Therapy Vitals Temp: 98.3 F (36.8 C) Temp Source: Oral Pulse Rate: 88 Resp: 17 BP: 122/74 Patient Position (if appropriate): Sitting Oxygen Therapy SpO2: 100 % O2 Device: Room Air Pain: Pain Assessment Pain Score: 0-No pain ADL: ADL Equipment Provided: Feeding equipment Eating: Not assessed (not fully assessed, does have built up handles and able to simulate) Grooming: Moderate assistance Upper Body Bathing: Minimal assistance Where Assessed-Upper Body Bathing: Sitting at sink Lower Body Bathing: Maximal assistance Where Assessed-Lower Body Bathing: Sitting at sink,Standing at sink Upper Body Dressing: Moderate assistance Lower Body Dressing: Dependent Where Assessed-Lower Body Dressing: Sitting at sink,Standing at sink Toileting: Not assessed Toilet Transfer: Maximal assistance      Therapy/Group: Individual Therapy  Michaela A Hoffman 07/23/2020, 3:58 PM

## 2020-07-24 LAB — URINALYSIS, ROUTINE W REFLEX MICROSCOPIC
Bilirubin Urine: NEGATIVE
Glucose, UA: NEGATIVE mg/dL
Hgb urine dipstick: NEGATIVE
Ketones, ur: NEGATIVE mg/dL
Nitrite: NEGATIVE
Protein, ur: NEGATIVE mg/dL
Specific Gravity, Urine: 1.027 (ref 1.005–1.030)
pH: 5 (ref 5.0–8.0)

## 2020-07-24 MED ORDER — DULOXETINE HCL 30 MG PO CPEP
60.0000 mg | ORAL_CAPSULE | Freq: Every day | ORAL | Status: DC
  Administered 2020-07-24 – 2020-07-30 (×7): 60 mg via ORAL
  Filled 2020-07-24 (×7): qty 2

## 2020-07-24 MED ORDER — GERHARDT'S BUTT CREAM
TOPICAL_CREAM | Freq: Three times a day (TID) | CUTANEOUS | Status: DC
  Administered 2020-07-24 – 2020-08-11 (×3): 1 via TOPICAL
  Filled 2020-07-24 (×2): qty 1

## 2020-07-24 NOTE — Progress Notes (Signed)
Patient ID: Yvette Phillips, female   DOB: 02-Oct-1960, 60 y.o.   MRN: [id number]  SW met with pt in room and called her sister Yvette Phillips [phone]) to provide updates from our team conference on gains made, but pt still requires physical assistance with ADLs, and d/c date remains 3/17. Pt and her sister informed SW that her boss, who is also her landlord, rented out her unit and informed her she was not able to return. She states after requesting a ramp this is what she was told. She has also not paid rent since December 2021, and it is reported she getting paid cash for her services at the group home. SW informed on open Adult Protective Services case and requested verbal consent to disclose information to the caseworker Yvette Phillips who has been calling SW ( last call on 3/1). Pt agreeable for SW to speak with caseworker. Pt brother in law reports that he and her wife filed a report for a wellness check in, and this is how she was found and brought into the hospital. Pt sister and brother in law are willing to have pt come to their home. Pt stayed with them for a short period of time when she was last discharged from the hospital. SW to follow-up with her sister after team conference next week.   SW left message for Yvette Phillips [phone]) informing on verbal consent given by patient, however, still unsure on what information can be shared since there is no formal consent of release form, and waiting on guidance from leadership on how to handle manner.   Yvette Phillips, MSW, [place] Office: [phone] Cell: [phone] Fax: [phone]

## 2020-07-24 NOTE — Plan of Care (Signed)
  Problem: Consults Goal: RH SPINAL CORD INJURY PATIENT EDUCATION Description:  See Patient Education module for education specifics.  Outcome: Progressing Goal: Skin Care Protocol Initiated - if Braden Score 18 or less Description: If consults are not indicated, leave blank or document N/A Outcome: Progressing   Problem: SCI BOWEL ELIMINATION Goal: RH STG MANAGE BOWEL WITH ASSISTANCE Description: STG Manage Bowel with Mod I Assistance. Outcome: Progressing Goal: RH STG SCI MANAGE BOWEL WITH MEDICATION WITH ASSISTANCE Description: STG SCI Manage bowel with medication with Mod I assistance. Outcome: Progressing   Problem: SCI BLADDER ELIMINATION Goal: RH STG MANAGE BLADDER WITH ASSISTANCE Description: STG Manage Bladder With Mod I Assistance Outcome: Progressing   Problem: RH SAFETY Goal: RH STG ADHERE TO SAFETY PRECAUTIONS W/ASSISTANCE/DEVICE Description: STG Adhere to Safety Precautions With Mod I Assistance/Device. Outcome: Progressing   Problem: RH PAIN MANAGEMENT Goal: RH STG PAIN MANAGED AT OR BELOW PT'S PAIN GOAL Description: <3 on a 0-10 pain scale. Outcome: Progressing   Problem: RH KNOWLEDGE DEFICIT SCI Goal: RH STG INCREASE KNOWLEDGE OF SELF CARE AFTER SCI Description: Patient will demonstrate knowledge of medication management, bowel and bladder care, skin and wound care, dietary management with educational materials and handouts provided by staff. Outcome: Progressing   

## 2020-07-24 NOTE — Progress Notes (Signed)
Physical Therapy Session Note  Patient Details  Name: Yvette Phillips MRN: [id number] Date of Birth: Jun 02, 1960  Today's Date: 07/24/2020 PT Individual Time: 1404-1430 PT Individual Time Calculation (min): 26 min   Short Term Goals: Week 2:  PT Short Term Goal 1 (Week 2): Pt will complete bed mobility with min A consistently PT Short Term Goal 2 (Week 2): Pt will perform transfers with LRAD and min A consistently PT Short Term Goal 3 (Week 2): Pt will ambulate x 50 ft with LRAD and mod A  Skilled Therapeutic Interventions/Progress Updates:     Pt received seated in recliner and agrees to therapy. Pt self propels WC x80' with occasional minA to redirect WC due to pt veering to the R. WC transport to dayroom for time management and energy conservation. Pt performs 1x10 LAQs and knee raises, and is unable to achieve full knee extension against gravity with either lower extremity. Pt then performs x3 reps of sit to stand using back of chair for upper extremity support. Chair serves to provide as potential block for knees (though pt does not buckle) and also to provide stable support for upper extremities. PT cues pt to extend hips and trunk for increased upright posture. Pt is able to maintain standing 30 seconds to 1 minute each trial. Pt does verbalize some pain in mid chest during activity. PT cues for rest breaks to manage pain and pt verbalizes improvement in symptoms. WC transport back to room. Pt left seated in WC with alarm intact and all needs within reach.  Therapy Documentation Precautions:  Precautions Precautions: Fall,Cervical Precaution Comments: s/p ACDF C4-5 (no cervical brace needed per acute), BUE/LE weakness Restrictions Weight Bearing Restrictions: No   Therapy/Group: Individual Therapy  Beau Fanny, PT, DPT 07/24/2020, 3:49 PM

## 2020-07-24 NOTE — Patient Care Conference (Signed)
Inpatient RehabilitationTeam Conference and Plan of Care Update Date: 07/24/2020   Time: 11:02 AM    Patient Name: Yvette Phillips      Medical Record Number: [id number]  Date of Birth: 1961-04-04 Sex: Female         Room/Bed: 4M10C/4M10C-01 Payor Info: Payor: MEDICAID POTENTIAL / Plan: MEDICAID POTENTIAL / Product Type: *No Product type* /    Admit Date/Time:  07/11/2020  1:08 PM  Primary Diagnosis:  Central cord syndrome at C4 level of cervical spinal cord, subsequent encounter [facility])  Hospital Problems: Principal Problem:   Central cord syndrome at C4 level of cervical spinal cord, subsequent encounter [facility]) Active Problems:   Alcohol abuse   Dysphagia, pharyngoesophageal phase   S/P cervical spinal fusion   Acute blood loss anemia   Macrocytosis   Neurogenic bladder   Neurogenic bowel    Expected Discharge Date: Expected Discharge Date: 08/09/20  Team Members Present: Physician leading conference: Dr. Genice Rouge Care Coodinator Present: Cecile Sheerer, LCSWA;Tyjah Hai Marlyne Beards, RN, BSN, CRRN Nurse Present: Otilio Carpen, RN PT Present: Peter Congo, PT OT Present: Earleen Newport, OT SLP Present: Colin Benton, SLP PPS Coordinator present : Fae Pippin, SLP     Current Status/Progress Goal Weekly Team Focus  Bowel/Bladder   Pt is incontinent x2.  Pt will regain continence.  Assess q shift and prn.   Swallow/Nutrition/ Hydration   dys 2 textures and thin liquids, mod-supervision A for use of strategies, however question need for intense strategies. reducing to x3 a week  Mod I  carryover of swallow strategies and dys 3 texture trials   ADL's   UB bathe/dress Min A, LB dress Max, Sit to stands Min, toilet transfers Min (from caretool)  min A/CGA overall  BADL retraining, standing balance/tolerance, sit <> stands, NMR, endurance   Mobility   supervision-min A bed mobility, min A stand pivot transfers, min A gait with RW x 30 ft, supervision to min A wc mobility up to 150  ft  min A overall, short distance gait  LE NMR, gait, transfers   Communication             Safety/Cognition/ Behavioral Observations            Pain   Pt reports pain is controlled with medication.  Pain will remain <3.  Assess q shift and prn.   Skin   Pt has small skin tears in groin area and bilateral heel cracking.  Skin will continue to heal and remain infection free.  Assess q shift and prn.     Discharge Planning:  Pt is uninsured. Anticipated caregiver will be her boyfriend Marcial Pacas. Pt has a sister that lives locally that will assist PRN.   Team Discussion: Bladder is a barrier, order placer for UA, C&S, Urology consulted, MASD to the bottom. Patient has urinary frequency and urgency. Increased Cymbalta for nerve pain and discontinued Lyrica. MASD entered into the flowsheet. Bowel program going well and continent. Will need sister and boyfriend in for family education, boyfriend to be main caregiver.   Patient on target to meet rehab goals: Stand pivot transfers, working on [employer], ambulated 47 ft today. Much improvement over beginning of rehab. Still at sink level bathing and dressing but at a min assist level. Goals are min assist for bathing and dressing, contact guard for transfers. She is on a dys 2, thin liquid diet and SLP is reducing therapy to 3 times a week.   *See Care Plan and progress notes for  long and short-term goals.   Revisions to Treatment Plan:  Increased Cymbalta to 60mg  QHS, discontinued Lyrica due to edema.  Teaching Needs: Family education, medication management, skin/wound care, transfer training, gait training, endurance training, pain management, bowel and bladder management.  Current Barriers to Discharge: Inaccessible home environment, Decreased caregiver support, Home enviroment access/layout, Incontinence, Neurogenic bowel and bladder, Wound care, Lack of/limited family support, Weight, Weight bearing restrictions, Medication compliance and  Behavior  Possible Resolutions to Barriers: Continue current medications, provide emotional support.     Medical Summary Current Status: Swelling 2-3+ LE edema; some MASD, Urinary constant frequency; nerve pain still an issue as well  Barriers to Discharge: Neurogenic Bowel & Bladder;Incontinence;Home enviroment access/layout;Decreased family/caregiver support;Medical stability;Weight bearing restrictions;Wound care;Other (comments);Weight  Barriers to Discharge Comments: bowel program nightly- bowel scontinent usually; no insurance Possible Resolutions to Focus: increased Duloxetine to 60 mg QHS and stopped Lyrica due to edema; if U/A (-) might need foley? ; stil has D2 thin diet for dysphagia- d/c 3/17   Continued Need for Acute Rehabilitation Level of Care: The patient requires daily medical management by a physician with specialized training in physical medicine and rehabilitation for the following reasons: Direction of a multidisciplinary physical rehabilitation program to maximize functional independence : Yes Medical management of patient stability for increased activity during participation in an intensive rehabilitation regime.: Yes Analysis of laboratory values and/or radiology reports with any subsequent need for medication adjustment and/or medical intervention. : Yes   I attest that I was present, lead the team conference, and concur with the assessment and plan of the team.   4/17 07/24/2020, 4:41 PM

## 2020-07-24 NOTE — Progress Notes (Signed)
Physical Therapy Session Note  Patient Details  Name: Yvette Phillips MRN: [id number] Date of Birth: [birth year]  Today's Date: 07/24/2020 PT Individual Time: 0900-1000 PT Individual Time Calculation (min): 60 min   Short Term Goals: Week 2:  PT Short Term Goal 1 (Week 2): Pt will complete bed mobility with min A consistently PT Short Term Goal 2 (Week 2): Pt will perform transfers with LRAD and min A consistently PT Short Term Goal 3 (Week 2): Pt will ambulate x 50 ft with LRAD and mod A  Skilled Therapeutic Interventions/Progress Updates:    Pt received seated in w/c in room, agreeable to PT session. No complaints of pain. Pt reports urge to urinate. Stand pivot transfer w/c to/from toilet with min A. Pt is max A for clothing management, able to continently void while seated on toilet. Manual w/c propulsion x 150 ft with use of BUE at Supervision level with increased time and cues needed for technique, especially with turns. Stand pivot transfer w/c to/from mat table with RW and min A. Standing alt L/R 4" step-taps, 3 x 10 reps with focus on hip/knee flexion and foot clearance on step. Ambulation x 23', x 28', x 46' with RW and min A for balance. Pt continues to exhibit improved endurance for gait training. Pt continues to exhibit steppage gait pattern and decreased DF bilaterally. Demonstrated how to perform heel/toe raises for pt to perform in room when not in therapy. Pt left seated in w/c in room with needs in reach, quick release belt and chair alarm in place.  Therapy Documentation Precautions:  Precautions Precautions: Fall,Cervical Precaution Comments: s/p ACDF C4-5 (no cervical brace needed per acute), BUE/LE weakness Restrictions Weight Bearing Restrictions: No   Therapy/Group: Individual Therapy   Peter Congo, PT, DPT  07/24/2020, 4:51 PM

## 2020-07-24 NOTE — Progress Notes (Signed)
Occupational Therapy Weekly Progress Note  Patient Details  Name: Yvette Phillips MRN: [id number] Date of Birth: 1961/01/08  Beginning of progress report period: July 17, 2020 End of progress report period: July 24, 2020  Today's Date: 07/24/2020 OT Individual Time: 8453-6468 OT Individual Time Calculation (min): 60 min    Patient has met 3 of 4 short term goals.  Pt is performing LB dressing with Max A, Sit to stands with Mod A at RW, bathing at sink level with Mod A for mostly LB assist for buttocks and past knee level, toilet tranfers with Min A with RW, and improving functional use of BUEs and increasing grip strength. Pt has made improvement in sit to stands, stand pivot transfers, and standing balance but is progressing slowly with grip strength and improved FMC. Pt is motivated to participate in OT sessions.   Patient continues to demonstrate the following deficits: muscle weakness, decreased cardiorespiratoy endurance, impaired timing and sequencing, abnormal tone, unbalanced muscle activation, decreased coordination and decreased motor planning, decreased motor planning and decreased sitting balance, decreased standing balance, decreased postural control and decreased balance strategies and therefore will continue to benefit from skilled OT intervention to enhance overall performance with BADL and Reduce care partner burden.  Patient progressing toward long term goals..  Continue plan of care.  OT Short Term Goals Week 2:  OT Short Term Goal 1 (Week 2): Pt will perform sit > stand at AD with no more than Mod A in prep for LB ADL OT Short Term Goal 1 - Progress (Week 2): Met OT Short Term Goal 2 (Week 2): Pt will perform BSC/toilet transfers with no more than Mod of 1 OT Short Term Goal 2 - Progress (Week 2): Met OT Short Term Goal 3 (Week 2): Pt will increase functional grip strength to perform oral hygiene w/ Supervision OT Short Term Goal 3 - Progress (Week 2): Progressing toward  goal OT Short Term Goal 4 (Week 2): Pt will complete LB dressing with max A OT Short Term Goal 4 - Progress (Week 2): Met Week 3:  OT Short Term Goal 1 (Week 3): Pt will perform LB dressing with Mod A OT Short Term Goal 2 (Week 3): Pt will increase functional grip strength to perform oral hygiene w/ Supervision OT Short Term Goal 3 (Week 3): Pt will complete BSC/toilet transfers with CGA using LRAD OT Short Term Goal 4 (Week 3): Pt will stand with one hand support for 2+ minutes in order to complete LB ADL  Skilled Therapeutic Interventions/Progress Updates:    Pt greeted at time of session supine in bed semireclined with nursing staff completing med pass. Once completed, pt noted to have soiled brief urine only and MD entered at this time discussing techniques for problem solving bladder incontinence. Pt able to wash periarea in supine with supervision, supine > sit Min A and sit> stand level to fully wash and clean periarea, standing with CGA/Min for standing balance at RW. Stand pivot > wheelchair Min A with extended time toward L side and set up at sink for bathing, Mod overall for buttocks and BLEs past knee level. Max A for donning brief/pants with mild posterior LOB in standing at sink with one hand support on sink. Therapist also assisted with shaving her face, therapist performing for safety as pt did not feel safe handling razor. Soaked feet in warm water for nursing staff to apply vaseline after session. Pt up in wheelchair with alarm on, call bell in reach.  Therapy Documentation Precautions:  Precautions Precautions: Fall,Cervical Precaution Comments: s/p ACDF C4-5 (no cervical brace needed per acute), BUE/LE weakness Restrictions Weight Bearing Restrictions: No    Therapy/Group: Individual Therapy  Viona Gilmore 07/24/2020, 7:15 AM

## 2020-07-24 NOTE — Progress Notes (Signed)
Speech Language Pathology Weekly Progress and Session Note  Patient Details  Name: Yvette Phillips MRN: [id number] Date of Birth: 1960/09/06  Beginning of progress report period: July 18, 2020 End of progress report period: July 24, 2020  Today's Date: 07/24/2020 SLP Individual Time: 1447-1530 SLP Individual Time Calculation (min): 43 min  Short Term Goals: Week 2: SLP Short Term Goal 1 (Week 2): Pt will consume dys 2 textures and thin liquids with supervision A verbal cues to utilize swallow strategies. SLP Short Term Goal 1 - Progress (Week 2): Not met SLP Short Term Goal 2 (Week 2): Pt will consume trials of dys 3 textures with minimal overt s/s aspiration noted x3 prior to diet advancement. SLP Short Term Goal 2 - Progress (Week 2): Not met    New Short Term Goals: Week 3: SLP Short Term Goal 1 (Week 3): Pt will consume dys 2 textures and thin liquids with supervision A verbal cues to utilize swallow strategies. SLP Short Term Goal 2 (Week 3): Pt will consume trials of dys 3 textures with minimal overt s/s aspiration noted x3 prior to diet advancement.  Weekly Progress Updates: Pt met 0 out 3 goals this reporting period due to lack of carryover of swallow strategies. Pt is able to recall, referencing visual aid posted in room strategies with supervision A for use of strategies with solids, however during meals requires at least min A verbal cues to utilize chin tuck and dry swallow appropriately. Only mild overt s/s aspiration is noted when strategies are not following accurately. SLP has reviewed MBS images for education and added in effortful swallow exercise to assist in reducing pharyngeal residue. SLP reduced to x3 a week due to reduced carryover of swallow strategies and believes pt will not likely preform swallow strategies accurately once discharged. Pt would benefit from skilled ST services in order to maximize functional independence and reduce burden of care, likely requiring  supervision at discharge with continued skilled ST services.      Intensity: Minumum of 1-2 x/day, 30 to 90 minutes Frequency: 1 to 3 out of 7 days Duration/Length of Stay: 3/17 Treatment/Interventions: Dysphagia/aspiration precaution training;Patient/family education;Internal/external aids   Daily Session  Skilled Therapeutic Interventions: Skilled ST services focused on swallow skills. Pt demonstrated recall swallow strategies with supervision A verbal cues for accuracy ( stating chin tuck and dry swallow for solids and liquids, only needed for solids.) SLP facilitated carryover of swallow strategies, pt required min A verbal cues and x2 cough noted when strategies not followed accurately during consumption of dys 3 texture and thin liquid via straw snack. SLP reviewed MBS with pt noting pharyngeal residue likely from surgical edema and possibly from reduced overall pharyngeal pressure impacted by reduced BOT approximation to pharyngeal wall and reduced pharyngeal constriction (althought imaging was difficult to decifer.) SLP educated pt on effortful swallow exercise pt was able to complete x5, but not able to return demonstration of masako exercise. SLP reduced to x3 a week due reduced carryover and tolerance of current diet without significant aspiration risk . Pt was left in room with call bell within reach and chair alarm set. SLP recommends to continue skilled services.    General    Pain Pain Assessment Pain Score: 0-No pain  Therapy/Group: Individual Therapy  Kristin Lamagna  [facility] 07/24/2020, 3:37 PM

## 2020-07-24 NOTE — Progress Notes (Signed)
PROGRESS NOTE   Subjective/Complaints: C Says bladder "going all the time".   Pees 10 minutes after cleaned up.  Can't stay dry- Had blow out x3 last night- after bowel program.  Explained she probably needed to get cleaned out.     ROS: Pt denies SOB, abd pain, CP, N/V/C/D, and vision changes  Objective:   No results found. Recent Labs    07/23/20 0511  WBC 6.3  HGB 12.3  HCT 37.8  PLT 307   Recent Labs    07/23/20 0511  NA 137  K 4.2  CL 101  CO2 28  GLUCOSE 86  BUN 13  CREATININE 0.76  CALCIUM 9.7    Intake/Output Summary (Last 24 hours) at 07/24/2020 1021 Last data filed at 07/24/2020 0830 Gross per 24 hour  Intake 660 ml  Output -  Net 660 ml        Physical Exam: Vital Signs Blood pressure 109/63, pulse 84, temperature 98 F (36.7 C), temperature source Oral, resp. rate 15, height 5\' 7"  (1.702 m), weight 81.4 kg, SpO2 98 %.    General: awake, alert, appropriate, NAD; sitting up in bed; OT in room HENT: conjugate gaze; oropharynx moist CV: RRR; no JVD Pulmonary: CTA B/L; no W/R/R- good air movement GI: soft, NT, ND, (+)BS Psychiatric: appropriate- bright, but concerned about bladder Neurological: Ox3 Skin: intact, but 2-3+ LE edema B/L- also feet are very callused with cracking, severe Also has MASD spot in R thigh crease- superficial skin breakdown not pressure ulcer Musculoskeletal:     Cervical back: Normal range of motion and neck supple.     Comments: Left knee pain with flexion.  Left knee with tenderness and mild edema Skin:    General: Skin is warm and dry.     Comments: Tinea pedis bilaterally with cracked right heel and thick toe nails.    Neurological: Ox3 Strong brisk hoffman's B/L- MAS of 1+ in UEs and trace to 1 in LEs.  Motor: Bilateral upper extremities: Shoulder abduction, elbow flexion/extension 4/5, wrist extension 3+/5, and intrinsics 2/5 Right lower extremity:  Hip flexion, knee extension 2+/5, ankle dorsiflexion 4/5 Left lower extremity: Hip flexion, knee extension 3 -/5, ankle dorsiflexion 4/5 Sensation to light touch diminished distal to elbows and knees    Assessment/Plan: 1. Functional deficits which require 3+ hours per day of interdisciplinary therapy in a comprehensive inpatient rehab setting.  Physiatrist is providing close team supervision and 24 hour management of active medical problems listed below.  Physiatrist and rehab team continue to assess barriers to discharge/monitor patient progress toward functional and medical goals  Care Tool:  Bathing    Body parts bathed by patient: Right arm,Left arm,Chest,Abdomen,Right upper leg,Left upper leg,Face,Front perineal area   Body parts bathed by helper: Buttocks,Right lower leg,Left lower leg     Bathing assist Assist Level: Moderate Assistance - Patient 50 - 74%     Upper Body Dressing/Undressing Upper body dressing   What is the patient wearing?: Pull over shirt    Upper body assist Assist Level: Minimal Assistance - Patient > 75%    Lower Body Dressing/Undressing Lower body dressing      What is  the patient wearing?: Incontinence brief,Pants     Lower body assist Assist for lower body dressing: Maximal Assistance - Patient 25 - 49%     Toileting Toileting    Toileting assist Assist for toileting: Maximal Assistance - Patient 25 - 49%     Transfers Chair/bed transfer  Transfers assist     Chair/bed transfer assist level: Minimal Assistance - Patient > 75%     Locomotion Ambulation   Ambulation assist   Ambulation activity did not occur: Safety/medical concerns  Assist level: Minimal Assistance - Patient > 75% Assistive device: Walker-rolling Max distance: 30   Walk 10 feet activity   Assist  Walk 10 feet activity did not occur: Safety/medical concerns  Assist level: Minimal Assistance - Patient > 75% Assistive device: Walker-rolling   Walk  50 feet activity   Assist Walk 50 feet with 2 turns activity did not occur: Safety/medical concerns         Walk 150 feet activity   Assist Walk 150 feet activity did not occur: Safety/medical concerns         Walk 10 feet on uneven surface  activity   Assist Walk 10 feet on uneven surfaces activity did not occur: Safety/medical concerns         Wheelchair     Assist Will patient use wheelchair at discharge?: Yes Type of Wheelchair: Manual    Wheelchair assist level: Supervision/Verbal cueing Max wheelchair distance: 150'    Wheelchair 50 feet with 2 turns activity    Assist        Assist Level: Supervision/Verbal cueing   Wheelchair 150 feet activity     Assist      Assist Level: Supervision/Verbal cueing   Blood pressure 109/63, pulse 84, temperature 98 F (36.7 C), temperature source Oral, resp. rate 15, height 5\' 7"  (1.702 m), weight 81.4 kg, SpO2 98 %.  Medical Problem List and Plan: 1.  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             -patient may shower             -ELOS/Goals: 14 to 17 days/supervision/min a            Continue CIR 2.  Antithrombotics:  -DVT/anticoagulation:  Pharmaceutical: Continue Lovenox  2/17- Very high risk of DVT due to SCI-will need 2-3 months of Lovenox per CHEST guidelines.  2/20: discussed with patient that vascular ultrasound is not available on weekends but that we can order upper extremity ultrasounds to assess for clots on Monday 2/21- based on exam, is B/L- above and below elbow itself- loks more like allergic rxn, but not hives than DVT- if gets worse, will order Dopplers  2/22- swelling better- was due to  med rxn.   2/27- will need 2-3 months of Lovenox, depending on if walking 150 ft when discharged             -antiplatelet therapy: N/A 3. Pain Management: Continue Oxycodone prn.  2/21- pt  reports pain is doing a little better- con't regimen  2/24- nerve pain still an issue, but had swelling with Lyrica, even at 75 mg TID-  Had to decrease to 25 mg TID, so now not that helpful. Will add Duloxetine 30 mg QHS for now and if tolerate,d increase dose.  2/26- no side effects and pain doing a little better-con't regimen  2/27- no side effects- will increase next Tuesday or  so.   3/1- increase Duloxetine to 60 mg nightly today             Monitor with increased exertion, particularly neuropathic pain 4. Mood: LCSW to follow for evaluation and support.              -antipsychotic agents: N/A 5. Neuropsych: This patient is capable of making decisions on her own behalf. 6. Skin/Wound Care:  Monitor incision for healing.  Added protein supplement to help promote healing.  7. Fluids/Electrolytes/Nutrition: Monitor I/Os.              Monitor CMP 8. ABLA:              Monitor CBC 9. Hypokalemia/Hypomagnesemia: Has resolved with brief supplementation.              2/22- K+ 4.0 and last Mg 1.9- con't regimen  2/28: K+ reviewed, normalized to 4.2 10. Macrocytosis/Polysubstance abuse:              Vitamin B12/folate levels ordered.   217- counseling given about polysubstance abuse 11. Neurogenic bowel and constipation: KUB ordered to determine stool burden.  2/17- moderate R sided stool burden with a lot of contrast left in colon- will order bowel program with dig stim nightly and con't Miralax BID for now- she has no control of stool per pt.   2/18- had 5+ BMs last night- feels cleaned out- now- kept saying had more in her- but now feels "great'.   2/21- having BMs with bowel program- will ask nursing to get done by 9pm, if possible  2/25- per pt, had BM on toilet last night- not clear if after bowel program- con't regimen and monitor  2/26- no suppository last night, since nursing decided with multiple BMs yesterday to only do dig stim- no results, but small BM on toilet this AM. COn't bowel  program nightly  2/27- had bowel program last night- was successful per note- had results.   3/1- had 3 large BMs last night with bowel program 12. Neurogenic bladder  2/17- PVRs 0-100 cc so far- not on Flomax- con't regimen for now- has no control of bladder, of note.   2/24- added Myrbetriq 25 mg daily- no improvement so far- still wet all the time- basically zero PVRs- so trying timed voiding q2 hours.  2/25- no improvement so far with Myrbetriq and had (+) high PVRs yesterday -will stop Myrbetriq and do Flomax 0.4 mg qsupper and send to Urology outpt.   2/26- Flomax already helping bladder- less frequent urination and a little more control- con't Regimen  2/27- back to being "crazy" per pt- going very frequently- will con't and if no better Tuesday, will increase to 0.8 mg nightly   3/1- will check U/A and Cx- has been a few weeks- just to make sure- if negative, will d/w pt about foley since d/w Urology- they want us to start Vesicare AND Myrbetriq, get her into retention and cath, but she doesn't have good hand function, I'm concerned she won't be able to cath.  13. Neuropathic pain  2/17- start Lyrica 50 mg TID for nerve pain- hopefully will help "being on fire".   2/18- slightly better today- con't regimen  2/21- Increased Lyrica to 75 mg TID since no side effects of meds? But the arm swelling could be from Lyrica?  2/22- improved with decrease in lyrica dose.  2/24- Add Duloxetine 30 mg QHS for nerve pain  2/26- no side effects- con't Duloxetine-  will increase Tuesday 3/1  14. Spasticity  2/17- will start Baclofen 5 mg QID for muscle tightness/spasms.     2/24- spasms well controlled now- con't Baclofen  2/25- spasms controlled, but getting a little tighter- will con't baclofen and con't to monitor 15. Dry skin: Eucerin ordered, encouraged use.  16. B/L elbow/arm swelling  2/21- refused a medicine last night- could be cause of arm swelling. Will look into this. Held Lyrica last night-  and swelling better- will d/w pt- asked her to refuse it today.    2/22- Lyrica decreased to 25 mg TID- swelling better 17. Dysphagia  D2 thins diet  2/22- con't regimen until upgraded by SLP.  18. Urinary urgency/frequency  2/22- will try Myrbetriq 25 mg QHS for urinary frequency- will also check PVRs with addition of new meds, so not retaining, hopefully.   2/24- as per Neurogenic bladder  2/26- added Flomax yesterday- working [facility] better- con't Flomax   2/28: continue Flomax.  19. LE edema  2/25- will start TEDs 8am to 6pm daily for LE edema.  3/1- will stop Lyrica- could be adding to Swelling 20. Severe calluses on Feet  2/27- add order that will have feet warmed with warm wet washcloth, then lotion applied, then vaseline over that BID- to help cracks/calluses.    I spent a total of 35 minutes on pt today- >50% coordination of care.  Calling urology and d/w foley with pt- wil do so again tomorrow, after U/A back.   LOS: 13 days A FACE TO FACE EVALUATION WAS PERFORMED  Skyleen Bentley 07/24/2020, 10:21 AM

## 2020-07-24 NOTE — Progress Notes (Signed)
Bowel program started at 1840. Noted mushy stool in rectum. Pt assisted to toilet 15 minutes after dulcolax suppository. Pt able to have a large continent mushy stool. Pt tolerated well. Assisted back to bed and pt currently resting in bed with all needs within reach.   Marylu Lund, RN

## 2020-07-25 MED ORDER — CHLORHEXIDINE GLUCONATE CLOTH 2 % EX PADS
6.0000 | MEDICATED_PAD | Freq: Every day | CUTANEOUS | Status: DC
  Administered 2020-07-26 – 2020-07-29 (×4): 6 via TOPICAL

## 2020-07-25 MED ORDER — BISACODYL 10 MG RE SUPP: 10.0000 mg | Freq: Every day | RECTAL | Status: DC | PRN

## 2020-07-25 NOTE — Discharge Instructions (Signed)
Inpatient Rehab Discharge Instructions  Kalista Laguardia Discharge date and time:    Activities/Precautions/ Functional Status: Activity: no lifting, driving, or strenuous exercise till cleared by MD Diet: regular diet Wound Care: keep wound clean and dry    Functional status:  ___ No restrictions     ___ Walk up steps independently _X__ 24/7 supervision/assistance   ___ Walk up steps with assistance ___ Intermittent supervision/assistance  ___ Bathe/dress independently ___ Walk with walker     ___ Bathe/dress with assistance ___ Walk Independently    ___ Shower independently ___ Walk with assistance    _X__ Shower with assistance _X__ No alcohol     ___ Return to work/school ________  Special Instructions: 1. PERFORM FOLEY CARE TWICE A DAY.  Marland Kitchen   My questions have been answered and I understand these instructions. I will adhere to these goals and the provided educational materials after my discharge from the hospital.  Patient/Caregiver Signature _______________________________ Date __________  Clinician Signature _______________________________________ Date __________  Please bring this form and your medication list with you to all your follow-up doctor's appointments.

## 2020-07-25 NOTE — Progress Notes (Signed)
Physical Therapy Weekly Progress Note  Patient Details  Name: Yvette Phillips MRN: [id number] Date of Birth: Jul 03, 1960  Beginning of progress report period: July 18, 2020 End of progress report period: July 25, 2020  Today's Date: 07/25/2020 PT Individual Time:PT Individual Time Calculation (min): 45 min 1015-1115 1545-1630 PT Time Calculation: 50 min, 45 min  Patient has met 2 of 3 short term goals.  Pt performs bed mobility with min A for trunk control. Pt requires min A to mod A with fatigue for STS with RW. Transfer using stand pivot with min A and RW. Gait with RW and min A up to 65 ft, with wc follow. Pt demonstrates occasional knee buckling and LOB, needs min A to recover.  Patient continues to demonstrate the following deficits muscle weakness, decreased cardiorespiratoy endurance, impaired timing and sequencing, abnormal tone and decreased coordination and decreased sitting balance, decreased standing balance, decreased postural control and decreased balance strategies and therefore will continue to benefit from skilled PT intervention to increase functional independence with mobility.  Patient progressing toward long term goals..  Continue plan of care.  PT Short Term Goals Week 2:  PT Short Term Goal 1 (Week 2): Pt will complete bed mobility with min A consistently PT Short Term Goal 1 - Progress (Week 2): Met PT Short Term Goal 2 (Week 2): Pt will perform transfers with LRAD and min A consistently PT Short Term Goal 2 - Progress (Week 2): Met PT Short Term Goal 3 (Week 2): Pt will ambulate x 50 ft with LRAD and mod A PT Short Term Goal 3 - Progress (Week 2): Met Week 3:  PT Short Term Goal 1 (Week 3): Pt will perform bed mobility with supervision. PT Short Term Goal 2 (Week 3): Pt will  ambulate with LRAD and min A x 100 ft. PT Short Term Goal 3 (Week 3): Pt will propel wc with supervision x 100 ft.  Skilled Therapeutic Interventions/Progress Updates:   AM session: Pt  received seated in wc and agreeable to therapy. Stand pivot transfer to commode with Min A for balance. Pt performs peri care in standing with assist for thoroughness. Gait to sink with RW and mod A  For safety due to tight environment. Pt performed hand hygiene in standing with CGA. Wc propulsion with BUE 2 x 100 ft, pt transported to gym for energy conservation. Pt performed step taps at 3" step with 2 handrails and min A, 2 x 10. Step ups to 3" step, 2 x 4 with cues to descend with eccentric quad control. Pt complains of 5/10 pain in L knee, states she had pain there before admission. Elevated lunge on 3" step, 2 x 10. Pt demonstrated L knee valgus during lunge and could not correct with verbal cueing. Pt returned to room after session and ambulated to recliner (~6 ft). Pt left with all needs in reach and chair alarm active. Pt missed 10 minutes due to fatigue.  PM session: Pt seated in recliner on arrival, agreeable to therapy. Pt performed STS with mod A initially, progressing to min A after being up for several minutes. Pt ambulated in room distances to bathroom commode and sink with mod A. Pt performed pericare with assist for thoroughness, dependent clothing management for time conservation. Pt stated this is the second time she has gone to the bathroom today, and she has been continent both times. Gait 4 x 65 ft with RW and min A and close wc follow. Pt continues to demonstrate  mild steppage gait on LLE and increased knee flexion during stance. Mild LOB and knee buckling at times, able to recover with min A and RW. Pt returned to room and performed stand pivot transfer to EOB. Sit>supine with mod A for LE management. Pt remained in bed with all needs in reach and bed alarm active.   Therapy Documentation Precautions:  Precautions Precautions: Fall,Cervical Precaution Comments: s/p ACDF C4-5 (no cervical brace needed per acute), BUE/LE weakness Restrictions Weight Bearing Restrictions:  No General:  PT missed time: 10 minutes Reason: Patient fatigue   Therapy/Group: Individual Therapy  Sharen Counter, SPT 07/25/2020, 6:06 PM

## 2020-07-25 NOTE — Progress Notes (Signed)
PROGRESS NOTE   Subjective/Complaints:  Pt reports is peeing al the time- when tried Myrbetriq, had acute urinary retention.  Has no control of bladder at all.   Having BMs on toilet now- so asking to stop bowel program.   ROS:  Pt denies SOB, abd pain, CP, N/V/C/D, and vision changes  Objective:   No results found. Recent Labs    07/23/20 0511  WBC 6.3  HGB 12.3  HCT 37.8  PLT 307   Recent Labs    07/23/20 0511  NA 137  K 4.2  CL 101  CO2 28  GLUCOSE 86  BUN 13  CREATININE 0.76  CALCIUM 9.7    Intake/Output Summary (Last 24 hours) at 07/25/2020 0755 Last data filed at 07/25/2020 [id number] Gross per 24 hour  Intake 800 ml  Output 325 ml  Net 475 ml        Physical Exam: Vital Signs Blood pressure 116/70, pulse 91, temperature (!) 97.5 F (36.4 C), temperature source Oral, resp. rate 16, height 5\' 7"  (1.702 m), weight 81.4 kg, SpO2 97 %.     General: awake, alert, appropriate, NAD- sitting on toilet, having BMs  HENT: conjugate gaze; oropharynx moist CV: regular rate and rhythm; no JVD Pulmonary: CTA B/L; no W/R/R- good air movement GI: soft, NT, ND, (+)BS Psychiatric: appropriate Neurological: Ox3 Skin: intact, but 2-3+ LE edema B/L- also feet are very callused with cracking, severe Also has MASD spot in R thigh crease- superficial skin breakdown not pressure ulcer Musculoskeletal:     Cervical back: Normal range of motion and neck supple.     Comments: Left knee pain with flexion.  Left knee with tenderness and mild edema Skin:    General: Skin is warm and dry.     Comments: Tinea pedis bilaterally with cracked right heel and thick toe nails.    Neurological: Ox3 Strong brisk hoffman's B/L- MAS of 1+ in UEs and trace to 1 in LEs.  Motor: Bilateral upper extremities: Shoulder abduction, elbow flexion/extension 4/5, wrist extension 3+/5, and intrinsics 2/5 Right lower extremity: Hip flexion, knee  extension 2+/5, ankle dorsiflexion 4/5 Left lower extremity: Hip flexion, knee extension 3 -/5, ankle dorsiflexion 4/5 Sensation to light touch diminished distal to elbows and knees    Assessment/Plan: 1. Functional deficits which require 3+ hours per day of interdisciplinary therapy in a comprehensive inpatient rehab setting.  Physiatrist is providing close team supervision and 24 hour management of active medical problems listed below.  Physiatrist and rehab team continue to assess barriers to discharge/monitor patient progress toward functional and medical goals  Care Tool:  Bathing    Body parts bathed by patient: Right arm,Left arm,Chest,Abdomen,Right upper leg,Left upper leg,Face,Front perineal area   Body parts bathed by helper: Buttocks,Right lower leg,Left lower leg     Bathing assist Assist Level: Moderate Assistance - Patient 50 - 74%     Upper Body Dressing/Undressing Upper body dressing   What is the patient wearing?: Pull over shirt    Upper body assist Assist Level: Minimal Assistance - Patient > 75%    Lower Body Dressing/Undressing Lower body dressing      What is the patient  wearing?: Incontinence brief,Pants     Lower body assist Assist for lower body dressing: Maximal Assistance - Patient 25 - 49%     Toileting Toileting    Toileting assist Assist for toileting: Maximal Assistance - Patient 25 - 49%     Transfers Chair/bed transfer  Transfers assist     Chair/bed transfer assist level: Minimal Assistance - Patient > 75%     Locomotion Ambulation   Ambulation assist   Ambulation activity did not occur: Safety/medical concerns  Assist level: Minimal Assistance - Patient > 75% Assistive device: Walker-rolling Max distance: 46   Walk 10 feet activity   Assist  Walk 10 feet activity did not occur: Safety/medical concerns  Assist level: Minimal Assistance - Patient > 75% Assistive device: Walker-rolling   Walk 50 feet  activity   Assist Walk 50 feet with 2 turns activity did not occur: Safety/medical concerns         Walk 150 feet activity   Assist Walk 150 feet activity did not occur: Safety/medical concerns         Walk 10 feet on uneven surface  activity   Assist Walk 10 feet on uneven surfaces activity did not occur: Safety/medical concerns         Wheelchair     Assist Will patient use wheelchair at discharge?: Yes Type of Wheelchair: Manual    Wheelchair assist level: Minimal Assistance - Patient > 75% Max wheelchair distance: 74'    Wheelchair 50 feet with 2 turns activity    Assist        Assist Level: Supervision/Verbal cueing   Wheelchair 150 feet activity     Assist      Assist Level: Supervision/Verbal cueing   Blood pressure 116/70, pulse 91, temperature (!) 97.5 F (36.4 C), temperature source Oral, resp. rate 16, height 5\' 7"  (1.702 m), weight 81.4 kg, SpO2 97 %.  Medical Problem List and Plan: 1.  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             -patient may shower             -ELOS/Goals: 14 to 17 days/supervision/min a            Continue CIR 2.  Antithrombotics:  -DVT/anticoagulation:  Pharmaceutical: Continue Lovenox  2/17- Very high risk of DVT due to SCI-will need 2-3 months of Lovenox per CHEST guidelines.  2/20: discussed with patient that vascular ultrasound is not available on weekends but that we can order upper extremity ultrasounds to assess for clots on Monday 2/21- based on exam, is B/L- above and below elbow itself- loks more like allergic rxn, but not hives than DVT- if gets worse, will order Dopplers  2/22- swelling better- was due to  med rxn.   2/27- will need 2-3 months of Lovenox, depending on if walking 150 ft when discharged             -antiplatelet therapy: N/A 3. Pain Management: Continue Oxycodone  prn.  2/21- pt reports pain is doing a little better- con't regimen  2/24- nerve pain still an issue, but had swelling with Lyrica, even at 75 mg TID-  Had to decrease to 25 mg TID, so now not that helpful. Will add Duloxetine 30 mg QHS for now and if tolerate,d increase dose.  2/26- no side effects and pain doing a little better-con't regimen  2/27- no side effects- will increase  next Tuesday or so.   3/1- increase Duloxetine to 60 mg nightly today  3/2- less complaints of pain lately- con't regimen             Monitor with increased exertion, particularly neuropathic pain 4. Mood: LCSW to follow for evaluation and support.              -antipsychotic agents: N/A 5. Neuropsych: This patient is capable of making decisions on her own behalf. 6. Skin/Wound Care:  Monitor incision for healing.  Added protein supplement to help promote healing.  7. Fluids/Electrolytes/Nutrition: Monitor I/Os.              Monitor CMP 8. ABLA:              Monitor CBC 9. Hypokalemia/Hypomagnesemia: Has resolved with brief supplementation.              2/22- K+ 4.0 and last Mg 1.9- con't regimen  2/28: K+ reviewed, normalized to 4.2 10. Macrocytosis/Polysubstance abuse:              Vitamin B12/folate levels ordered.   217- counseling given about polysubstance abuse 11. Neurogenic bowel and constipation: KUB ordered to determine stool burden.  2/17- moderate R sided stool burden with a lot of contrast left in colon- will order bowel program with dig stim nightly and con't Miralax BID for now- she has no control of stool per pt.   2/18- had 5+ BMs last night- feels cleaned out- now- kept saying had more in her- but now feels "great'.   2/21- having BMs with bowel program- will ask nursing to get done by 9pm, if possible  2/25- per pt, had BM on toilet last night- not clear if after bowel program- con't regimen and monitor  2/26- no suppository last night, since nursing decided with multiple BMs yesterday to only  do dig stim- no results, but small BM on toilet this AM. COn't bowel program nightly  2/27- had bowel program last night- was successful per note- had results.   3/1- had 3 large BMs last night with bowel program  3/2- since going on toilet, will stop bowel program 12. Neurogenic bladder  2/17- PVRs 0-100 cc so far- not on Flomax- con't regimen for now- has no control of bladder, of note.   2/24- added Myrbetriq 25 mg daily- no improvement so far- still wet all the time- basically zero PVRs- so trying timed voiding q2 hours.  2/25- no improvement so far with Myrbetriq and had (+) high PVRs yesterday -will stop Myrbetriq and do Flomax 0.4 mg qsupper and send to Urology outpt.   2/26- Flomax already helping bladder- less frequent urination and a little more control- con't Regimen  2/27- back to being "crazy" per pt- going very frequently- will con't and if no better Tuesday, will increase to 0.8 mg nightly   3/1- will check U/A and Cx- has been a few weeks- just to make sure- if negative, will d/w pt about foley since d/w Urology- they want [place] to start Vesicare AND Myrbetriq, get her into retention and cath, but she doesn't have good hand function, I'm concerned she won't be able to cath.   3/2- discussed foley vs being wet vs Vesicare/Myrbetriq- she wants to do foley- for now- until f/u with Urology.  13. Neuropathic pain  2/17- start Lyrica 50 mg TID for nerve pain- hopefully will help "being on fire".   2/18- slightly better today- con't regimen  2/21- Increased Lyrica  to 75 mg TID since no side effects of meds? But the arm swelling could be from Lyrica?  2/22- improved with decrease in lyrica dose.  2/24- Add Duloxetine 30 mg QHS for nerve pain  2/26- no side effects- con't Duloxetine- will increase Tuesday 3/1   3/2- increased yesterday- con't regimen- stopped Lyrica 14. Spasticity  2/17- will start Baclofen 5 mg QID for muscle tightness/spasms.     2/24- spasms well controlled now- con't  Baclofen  2/25- spasms controlled, but getting a little tighter- will con't baclofen and con't to monitor 15. Dry skin: Eucerin ordered, encouraged use.  16. B/L elbow/arm swelling  2/21- refused a medicine last night- could be cause of arm swelling. Will look into this. Held Lyrica last night- and swelling better- will d/w pt- asked her to refuse it today.    2/22- Lyrica decreased to 25 mg TID- swelling better  3/2- d/c Lyrica 17. Dysphagia  D2 thins diet  2/22- con't regimen until upgraded by SLP.  18. Urinary urgency/frequency  2/22- will try Myrbetriq 25 mg QHS for urinary frequency- will also check PVRs with addition of new meds, so not retaining, hopefully.   2/24- as per Neurogenic bladder  2/26- added Flomax yesterday- working [facility] better- con't Flomax   2/28: continue Flomax.   3/2- will d/c flomax and place foley since constant voiding.  19. LE edema  2/25- will start TEDs 8am to 6pm daily for LE edema.  3/1- will stop Lyrica- could be adding to Swelling 20. Severe calluses on Feet  2/27- add order that will have feet warmed with warm wet washcloth, then lotion applied, then vaseline over that BID- to help cracks/calluses.      LOS: 14 days A FACE TO FACE EVALUATION WAS PERFORMED  Darcelle Herrada 07/25/2020, 7:55 AM

## 2020-07-25 NOTE — Progress Notes (Signed)
Occupational Therapy Session Note  Patient Details  Name: Yvette Phillips MRN: [id number] Date of Birth: May 16, 1961  Today's Date: 07/25/2020 OT Individual Time: 0700-0757 OT Individual Time Calculation (min): 57 min    Short Term Goals: Week 1:  OT Short Term Goal 1 (Week 1): Pt will perform self feeding with AE with Supervision OT Short Term Goal 1 - Progress (Week 1): Met OT Short Term Goal 2 (Week 1): Pt will perform sit > stand at AD with no more than Mod A in prep for LB ADL OT Short Term Goal 2 - Progress (Week 1): Progressing toward goal OT Short Term Goal 3 (Week 1): Pt will perform BSC/toilet transfers with no more than Mod of 1 OT Short Term Goal 3 - Progress (Week 1): Progressing toward goal OT Short Term Goal 4 (Week 1): Pt will increase functional grip strength to perform oral hygiene w/ Supervision OT Short Term Goal 4 - Progress (Week 1): Progressing toward goal  Skilled Therapeutic Interventions/Progress Updates:    1:1. Pt received in bed agreeable to OT. Pt reporting urgent need to toilet. Pt completes sit to stand in stedy 2x throughout session for bladder and bowel movement separately. Pt reqiures total A for CM and hygiene during both toileting incidents. Pt completes UB bathing at sink and UB dressing with MIN A. Pt able to use reacher to thread 1LE into pants with VC but requires A for LLE. Sit to stand at sink with MIN A suing sink and arm rest to power up into standing. Exited session with pt seated in w/c sorting clothing and folding, exit alarm on and call light in reach   Therapy Documentation Precautions:  Precautions Precautions: Fall,Cervical Precaution Comments: s/p ACDF C4-5 (no cervical brace needed per acute), BUE/LE weakness Restrictions Weight Bearing Restrictions: No General:   Vital Signs: Therapy Vitals Temp: (!) 97.5 F (36.4 C) Temp Source: Oral Pulse Rate: 91 Resp: 16 BP: 116/70 Patient Position (if appropriate): Lying Oxygen  Therapy SpO2: 97 % O2 Device: Room Air Pain:   ADL: ADL Equipment Provided: Feeding equipment Eating: Not assessed (not fully assessed, does have built up handles and able to simulate) Grooming: Moderate assistance Upper Body Bathing: Minimal assistance Where Assessed-Upper Body Bathing: Sitting at sink Lower Body Bathing: Maximal assistance Where Assessed-Lower Body Bathing: Sitting at sink,Standing at sink Upper Body Dressing: Moderate assistance Lower Body Dressing: Dependent Where Assessed-Lower Body Dressing: Sitting at sink,Standing at sink Toileting: Not assessed Toilet Transfer: Maximal assistance Vision   Perception    Praxis   Exercises:   Other Treatments:     Therapy/Group: Individual Therapy  Tonny Branch 07/25/2020, 6:40 AM

## 2020-07-26 LAB — URINE CULTURE: Culture: 20000 — AB

## 2020-07-26 NOTE — Progress Notes (Signed)
Occupational Therapy Session Note  Patient Details  Name: Yvette Phillips MRN: [id number] Date of Birth: Apr 11, 1961  Today's Date: 07/26/2020 OT Individual Time: 1884-1660 OT Individual Time Calculation (min): 40 min    Short Term Goals: Week 2:  OT Short Term Goal 1 (Week 2): Pt will perform sit > stand at AD with no more than Mod A in prep for LB ADL OT Short Term Goal 1 - Progress (Week 2): Met OT Short Term Goal 2 (Week 2): Pt will perform BSC/toilet transfers with no more than Mod of 1 OT Short Term Goal 2 - Progress (Week 2): Met OT Short Term Goal 3 (Week 2): Pt will increase functional grip strength to perform oral hygiene w/ Supervision OT Short Term Goal 3 - Progress (Week 2): Progressing toward goal OT Short Term Goal 4 (Week 2): Pt will complete LB dressing with max A OT Short Term Goal 4 - Progress (Week 2): Met Week 3:  OT Short Term Goal 1 (Week 3): Pt will perform LB dressing with Mod A OT Short Term Goal 2 (Week 3): Pt will increase functional grip strength to perform oral hygiene w/ Supervision OT Short Term Goal 3 (Week 3): Pt will complete BSC/toilet transfers with CGA using LRAD OT Short Term Goal 4 (Week 3): Pt will stand with one hand support for 2+ minutes in order to complete LB ADL  Skilled Therapeutic Interventions/Progress Updates:    Pt greeted at time of session semireclined in bed agreeable to OT session, no pain. MD entering at beginning of session for morning rounds as well. Extensive discussion at beginning of session regarding foley, psychiatry consult, living with sister when going home, and pt becoming emotional and therapist providing emotional support. Supine > sit EOB Min A, changed shirt with CGA w/ extended time to manage, donned pants with Max A therapist assist to thread foley and pull up pants in back when standing but pt able to assist in standing on lateral aspect of pants. Stand pivot to wheelchair Min A but note RLE buckling part of the way through  and able to recover. Set up at sink and brushed teeth Min A to put toothepast on brush after trialing several compensatory techniques. Hygiene and grooming with Min A overall. Set up with alarm on call bell in reach. Note foley education throughout session to answer pt questions.    Therapy Documentation Precautions:  Precautions Precautions: Fall,Cervical Precaution Comments: s/p ACDF C4-5 (no cervical brace needed per acute), BUE/LE weakness Restrictions Weight Bearing Restrictions: No     Therapy/Group: Individual Therapy  Viona Gilmore 07/26/2020, 7:06 AM

## 2020-07-26 NOTE — Progress Notes (Signed)
Occupational Therapy Session Note  Patient Details  Name: Yvette Phillips MRN: [id number] Date of Birth: [birth year]  Today's Date: 07/26/2020 OT Individual Time: 1500-1530 OT Individual Time Calculation (min): 30 min    Short Term Goals: Week 3:  OT Short Term Goal 1 (Week 3): Pt will perform LB dressing with Mod A OT Short Term Goal 2 (Week 3): Pt will increase functional grip strength to perform oral hygiene w/ Supervision OT Short Term Goal 3 (Week 3): Pt will complete BSC/toilet transfers with CGA using LRAD OT Short Term Goal 4 (Week 3): Pt will stand with one hand support for 2+ minutes in order to complete LB ADL  Skilled Therapeutic Interventions/Progress Updates:    Pt reports pain in bilateral knees. OT noted heat packs applied to affected area at beginning of treatment.  Pt instructed through bilateral grip and 3 jaw chuck and lat pinch strengthening using min resistive theraputty with small bead pinch and retrieval component to facilitate increased ease during oral hygiene and dressing tasks.  Noted pt presenting right and left with positive Wartenberg's sign and Bovier's sign in ring and small digits resulting in impaired gross control and strength in bilateral hands. Pt may benefit from NMES and intrinsic hand strengthening to facilitate increased strength.  Pt completed stand pivot using RW w/c to EOB with intermittent blocking of right knee needed, and sit to supine with min assist.  Call bell in reach, bed alarm on.  Therapy Documentation Precautions:  Precautions Precautions: Fall,Cervical Precaution Comments: s/p ACDF C4-5 (no cervical brace needed per acute), BUE/LE weakness Restrictions Weight Bearing Restrictions: No   Therapy/Group: Individual Therapy  Amie Critchley 07/26/2020, 4:25 PM

## 2020-07-26 NOTE — Progress Notes (Signed)
Physical Therapy Session Note  Patient Details  Name: Yvette Phillips MRN: [id number] Date of Birth: Jan 26, 1961  Today's Date: 07/26/2020 PT Individual Time: 1300-1400 PT Individual Time Calculation (min): 60 min   Short Term Goals: Week 1:  PT Short Term Goal 1 (Week 1): pt to demonstrate supine<>sit mod A PT Short Term Goal 1 - Progress (Week 1): Met PT Short Term Goal 2 (Week 1): pt to demonstrate functional transfer with LRAD at mod A PT Short Term Goal 2 - Progress (Week 1): Met PT Short Term Goal 3 (Week 1): pt to demonstrate gait training with LRAD for 20' PT Short Term Goal 3 - Progress (Week 1): Progressing toward goal PT Short Term Goal 4 (Week 1): pt to demonstrate standing balance at mod A for at least 3 mins PT Short Term Goal 4 - Progress (Week 1): Met Week 2:  PT Short Term Goal 1 (Week 2): Pt will complete bed mobility with min A consistently PT Short Term Goal 1 - Progress (Week 2): Met PT Short Term Goal 2 (Week 2): Pt will perform transfers with LRAD and min A consistently PT Short Term Goal 2 - Progress (Week 2): Met PT Short Term Goal 3 (Week 2): Pt will ambulate x 50 ft with LRAD and mod A PT Short Term Goal 3 - Progress (Week 2): Met Week 3:  PT Short Term Goal 1 (Week 3): Pt will perform bed mobility with supervision. PT Short Term Goal 2 (Week 3): Pt will  ambulate with LRAD and min A x 100 ft. PT Short Term Goal 3 (Week 3): Pt will propel wc with supervision x 100 ft.  Skilled Therapeutic Interventions/Progress Updates:    Pain:  Pt reports 6/10 pain L KNEE W/gait.  Transitioned to decreased wbing activites.  Treatment to tolerance.  Rest breaks and repositioning as needed.  Pt initially oob in wc and agreeable to treatment session with focus on ROM, strength, gait (limited by pain in L knee today)  Pt propels wc x 3f w/bilat UEs and min assist for straight path, veers to R possibly due to wc misalignment vs weakness.  Sit to stand w/mod assist.  Gait 113f w/min assist, cues for safe distance within walker, cues for uprigth posture and gaze, pt w/buckling R knee w/min assist for recovery, c/o increased pain L knee.  Pt transported to dayroom for Nustep/low impact activity. Sit to stand and stand pivot transfer to Nustep w/RW w/min to mod assist.   NuStep x 8 min L4 resistance total steps = 500.  stand pivot transfer to wc w/mod assist. stand pivot transfer to mat w/min to mod assist.    Seated LAQs x 10 Sit to supine w/max assist for lower body. Heel slides x 15 Hip abd/add x 15 TKEs x 15 Moist heat applied to each knee during therex on opposite limb for pain relief. Supine to sit w/mod assist. stand pivot transfer to wc w/min assist.  PT transported to room and provided w/warm packs secured w/light acewraps to bilat knees for pain relief. Pt left oob in wc w/alarm belt set and needs in reach   Therapy Documentation Precautions:  Precautions Precautions: Fall,Cervical Precaution Comments: s/p ACDF C4-5 (no cervical brace needed per acute), BUE/LE weakness Restrictions Weight Bearing Restrictions: No    Therapy/Group: Individual Therapy' BaCallie FieldingPTLakeshore/07/2020, 3:12 PM

## 2020-07-26 NOTE — Progress Notes (Signed)
Speech Language Pathology Daily Session Note  Patient Details  Name: Yvette Phillips MRN: [id number] Date of Birth: 1960/10/14  Today's Date: 07/26/2020 SLP Individual Time: 1030-1110 SLP Individual Time Calculation (min): 40 min  Short Term Goals: Week 3: SLP Short Term Goal 1 (Week 3): Pt will consume dys 2 textures and thin liquids with supervision A verbal cues to utilize swallow strategies. SLP Short Term Goal 2 (Week 3): Pt will consume trials of dys 3 textures with minimal overt s/s aspiration noted x3 prior to diet advancement.  Skilled Therapeutic Interventions: Pt was seen for skilled ST targeting dysphagia goals. SLP facilitated session with an advanced trial of moist blueberry muffin (dysphagia 3 texture) with thin liquids. Although pt able to use aids in room to verbally recall all swallowing strategies Mod I, she only consistently used liquid washes and small bites/sips without cueing. Min A verbal cueing required for implementation of chin tuck and extra dry swallows. One subtle throat clear noted during intake, but otherwise intake was efficient. Pt denied any feeling of pharyngeal fullness. Pt also able to recall and demonstrate effortful swallow exercise X10 with only Supervision A verbal cues. Due to pt's difficulty carrying over compensatory swallow strategies to clear pharyngeal residue that was noted on original MBSS, would recommend proceeding with repeat MBSS tomorrow. This will allow SLP to reassess the presence and any functional impact of residue with more solid textures, and in turn, the potential for safe solid advancement. SLP will schedule MBSS for 07/27/20 at 0900. Pt left sitting in wheelchair with alarm set and needs within reach. Continue per current plan of care.         Pain Pain Assessment Pain Scale: Faces Faces Pain Scale: No hurt   Therapy/Group: Individual Therapy  Little Ishikawa 07/26/2020, 7:19 AM

## 2020-07-26 NOTE — Progress Notes (Signed)
PROGRESS NOTE   Subjective/Complaints:   Pt got foley placed- had a leak last night- but has been fixed- no more issues since then.  Is happy staying dry.   Found out there's an issue with going back to her apartment- per chart, unable to go back due to lack of rent payment? Pt also very sad about this.  LBM yesterday- was a good one.  Still having a lot of gas.    ROS:   Pt denies SOB, abd pain, CP, N/V/C/D, and vision changes   Objective:   No results found. No results for input(s): WBC, HGB, HCT, PLT in the last 72 hours. No results for input(s): NA, K, CL, CO2, GLUCOSE, BUN, CREATININE, CALCIUM in the last 72 hours.  Intake/Output Summary (Last 24 hours) at 07/26/2020 0908 Last data filed at 07/26/2020 0807 Gross per 24 hour  Intake 480 ml  Output --  Net 480 ml        Physical Exam: Vital Signs Blood pressure 127/79, pulse 92, temperature 98.3 F (36.8 C), resp. rate 17, height 5\' 7"  (1.702 m), weight 81.4 kg, SpO2 93 %.      General: awake, alert, appropriate, NAD- sitting up in bed; OT in room HENT: conjugate gaze; oropharynx moist CV: regular rate and rhythm; no JVD Pulmonary: CTA B/L; no W/R/R- good air movement GI: soft, NT, ND, (+)BS Psychiatric: appropriate, but can tell very sad Neurological: Ox3 GU: has foley in place- chronic foley.  Skin: intact, but 2-3+ LE edema B/L- also feet are very callused with cracking, severe Also has MASD spot in R thigh crease- superficial skin breakdown not pressure ulcer Musculoskeletal:     Cervical back: Normal range of motion and neck supple.     Comments: Left knee pain with flexion.  Left knee with tenderness and mild edema Skin:    General: Skin is warm and dry.     Comments: Tinea pedis bilaterally with cracked right heel and thick toe nails.    Neurological: Ox3 Strong brisk hoffman's B/L- MAS of 1+ in UEs and trace to 1 in LEs.  Motor: Bilateral  upper extremities: Shoulder abduction, elbow flexion/extension 4/5, wrist extension 3+/5, and intrinsics 2/5 Right lower extremity: Hip flexion, knee extension 2+/5, ankle dorsiflexion 4/5 Left lower extremity: Hip flexion, knee extension 3 -/5, ankle dorsiflexion 4/5 Sensation to light touch diminished distal to elbows and knees    Assessment/Plan: 1. Functional deficits which require 3+ hours per day of interdisciplinary therapy in a comprehensive inpatient rehab setting.  Physiatrist is providing close team supervision and 24 hour management of active medical problems listed below.  Physiatrist and rehab team continue to assess barriers to discharge/monitor patient progress toward functional and medical goals  Care Tool:  Bathing    Body parts bathed by patient: Right arm,Left arm,Chest,Abdomen,Right upper leg,Left upper leg,Face,Front perineal area   Body parts bathed by helper: Buttocks,Right lower leg,Left lower leg     Bathing assist Assist Level: Moderate Assistance - Patient 50 - 74%     Upper Body Dressing/Undressing Upper body dressing   What is the patient wearing?: Pull over shirt    Upper body assist Assist Level: Contact  Guard/Touching assist    Lower Body Dressing/Undressing Lower body dressing      What is the patient wearing?: Pants     Lower body assist Assist for lower body dressing: Maximal Assistance - Patient 25 - 49%     Toileting Toileting    Toileting assist Assist for toileting: Maximal Assistance - Patient 25 - 49%     Transfers Chair/bed transfer  Transfers assist     Chair/bed transfer assist level: Minimal Assistance - Patient > 75%     Locomotion Ambulation   Ambulation assist   Ambulation activity did not occur: Safety/medical concerns  Assist level: Minimal Assistance - Patient > 75% Assistive device: Walker-rolling Max distance: 65   Walk 10 feet activity   Assist  Walk 10 feet activity did not occur:  Safety/medical concerns  Assist level: Minimal Assistance - Patient > 75% Assistive device: Walker-rolling   Walk 50 feet activity   Assist Walk 50 feet with 2 turns activity did not occur: Safety/medical concerns    Assistive device: Walker-rolling    Walk 150 feet activity   Assist Walk 150 feet activity did not occur: Safety/medical concerns         Walk 10 feet on uneven surface  activity   Assist Walk 10 feet on uneven surfaces activity did not occur: Safety/medical concerns         Wheelchair     Assist Will patient use wheelchair at discharge?: Yes Type of Wheelchair: Manual    Wheelchair assist level: Minimal Assistance - Patient > 75% Max wheelchair distance: 46'    Wheelchair 50 feet with 2 turns activity    Assist        Assist Level: Minimal Assistance - Patient > 75%   Wheelchair 150 feet activity     Assist      Assist Level: Supervision/Verbal cueing   Blood pressure 127/79, pulse 92, temperature 98.3 F (36.8 C), resp. rate 17, height 5\' 7"  (1.702 m), weight 81.4 kg, SpO2 93 %.  Medical Problem List and Plan: 1.  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             -patient may shower             -ELOS/Goals: 14 to 17 days/supervision/min a            Continue CIR 2.  Antithrombotics:  -DVT/anticoagulation:  Pharmaceutical: Continue Lovenox  2/17- Very high risk of DVT due to SCI-will need 2-3 months of Lovenox per CHEST guidelines.  2/20: discussed with patient that vascular ultrasound is not available on weekends but that we can order upper extremity ultrasounds to assess for clots on Monday 2/21- based on exam, is B/L- above and below elbow itself- loks more like allergic rxn, but not hives than DVT- if gets worse, will order Dopplers  2/22- swelling better- was due to  med rxn.   2/27- will need 2-3 months of  Lovenox, depending on if walking 150 ft when discharged             -antiplatelet therapy: N/A 3. Pain Management: Continue Oxycodone prn.  2/21- pt reports pain is doing a little better- con't regimen  2/24- nerve pain still an issue, but had swelling with Lyrica, even at 75 mg TID-  Had to decrease to 25 mg TID, so now not that helpful. Will add Duloxetine 30 mg QHS for now and if tolerate,d increase  dose.  2/26- no side effects and pain doing a little better-con't regimen  2/27- no side effects- will increase next Tuesday or so.   3/1- increase Duloxetine to 60 mg nightly today  3/2- less complaints of pain lately- con't regimen  3/3- off Lyrica- on Duloxetine 60 mg QHS- con't regimen             Monitor with increased exertion, particularly neuropathic pain 4. Mood: LCSW to follow for evaluation and support.   3/3- will get pt Neuropsychology             -antipsychotic agents: N/A 5. Neuropsych: This patient is capable of making decisions on her own behalf. 6. Skin/Wound Care:  Monitor incision for healing.  Added protein supplement to help promote healing.  7. Fluids/Electrolytes/Nutrition: Monitor I/Os.              Monitor CMP 8. ABLA:              Monitor CBC 9. Hypokalemia/Hypomagnesemia: Has resolved with brief supplementation.              2/22- K+ 4.0 and last Mg 1.9- con't regimen  2/28: K+ reviewed, normalized to 4.2 10. Macrocytosis/Polysubstance abuse:              Vitamin B12/folate levels ordered.   217- counseling given about polysubstance abuse 11. Neurogenic bowel and constipation: KUB ordered to determine stool burden.  2/17- moderate R sided stool burden with a lot of contrast left in colon- will order bowel program with dig stim nightly and con't Miralax BID for now- she has no control of stool per pt.   2/18- had 5+ BMs last night- feels cleaned out- now- kept saying had more in her- but now feels "great'.   2/21- having BMs with bowel program- will ask nursing  to get done by 9pm, if possible  2/25- per pt, had BM on toilet last night- not clear if after bowel program- con't regimen and monitor  2/26- no suppository last night, since nursing decided with multiple BMs yesterday to only do dig stim- no results, but small BM on toilet this AM. COn't bowel program nightly  2/27- had bowel program last night- was successful per note- had results.   3/1- had 3 large BMs last night with bowel program  3/2- since going on toilet, will stop bowel program  3/3- LBM yesterday - having a lot of gas- d/w pt using fewer straws, but check with SLP first.  12. Neurogenic bladder  2/17- PVRs 0-100 cc so far- not on Flomax- con't regimen for now- has no control of bladder, of note.   2/24- added Myrbetriq 25 mg daily- no improvement so far- still wet all the time- basically zero PVRs- so trying timed voiding q2 hours.  2/25- no improvement so far with Myrbetriq and had (+) high PVRs yesterday -will stop Myrbetriq and do Flomax 0.4 mg qsupper and send to Urology outpt.   2/26- Flomax already helping bladder- less frequent urination and a little more control- con't Regimen  2/27- back to being "crazy" per pt- going very frequently- will con't and if no better Tuesday, will increase to 0.8 mg nightly   3/1- will check U/A and Cx- has been a few weeks- just to make sure- if negative, will d/w pt about foley since d/w Urology- they want [place] to start Vesicare AND Myrbetriq, get her into retention and cath, but she doesn't have good hand function, I'm concerned she  won't be able to cath.   3/2- discussed foley vs being wet vs Vesicare/Myrbetriq- she wants to do foley- for now- until f/u with Urology.  3/3- foley placed- had a little leak last night- con't foley at this time.   13. Neuropathic pain  2/17- start Lyrica 50 mg TID for nerve pain- hopefully will help "being on fire".   2/18- slightly better today- con't regimen  2/21- Increased Lyrica to 75 mg TID since no side  effects of meds? But the arm swelling could be from Lyrica?  2/22- improved with decrease in lyrica dose.  2/24- Add Duloxetine 30 mg QHS for nerve pain  2/26- no side effects- con't Duloxetine- will increase Tuesday 3/1   3/2- increased yesterday- con't regimen- stopped Lyrica 14. Spasticity  2/17- will start Baclofen 5 mg QID for muscle tightness/spasms.     2/24- spasms well controlled now- con't Baclofen  2/25- spasms controlled, but getting a little tighter- will con't baclofen and con't to monitor 15. Dry skin: Eucerin ordered, encouraged use.  16. B/L elbow/arm swelling  2/21- refused a medicine last night- could be cause of arm swelling. Will look into this. Held Lyrica last night- and swelling better- will d/w pt- asked her to refuse it today.    2/22- Lyrica decreased to 25 mg TID- swelling better  3/2- d/c Lyrica 17. Dysphagia  D2 thins diet  2/22- con't regimen until upgraded by SLP.  18. Urinary urgency/frequency  2/22- will try Myrbetriq 25 mg QHS for urinary frequency- will also check PVRs with addition of new meds, so not retaining, hopefully.   2/24- as per Neurogenic bladder  2/26- added Flomax yesterday- working [facility] better- con't Flomax   2/28: continue Flomax.   3/2- will d/c flomax and place foley since constant voiding.   3/3- is dry for first time since SCI- pt happy about this.  19. LE edema  2/25- will start TEDs 8am to 6pm daily for LE edema.  3/1- will stop Lyrica- could be adding to Swelling 20. Severe calluses on Feet  2/27- add order that will have feet warmed with warm wet washcloth, then lotion applied, then vaseline over that BID- to help cracks/calluses.      LOS: 15 days A FACE TO FACE EVALUATION WAS PERFORMED  Gerren Hoffmeier 07/26/2020, 9:08 AM

## 2020-07-27 ENCOUNTER — Inpatient Hospital Stay (HOSPITAL_COMMUNITY): Payer: Medicaid Other

## 2020-07-27 NOTE — Progress Notes (Signed)
PROGRESS NOTE   Subjective/Complaints:   Pt reports doing "OK" and "fine", but admits it's not great with home situation and not having apartment to go back to.   Bowels working OK.  Pain is controlled.    ROS:  Pt denies SOB, abd pain, CP, N/V/C/D, and vision changes  Objective:   No results found. No results for input(s): WBC, HGB, HCT, PLT in the last 72 hours. No results for input(s): NA, K, CL, CO2, GLUCOSE, BUN, CREATININE, CALCIUM in the last 72 hours.  Intake/Output Summary (Last 24 hours) at 07/27/2020 0830 Last data filed at 07/27/2020 0552 Gross per 24 hour  Intake 540 ml  Output 1850 ml  Net -1310 ml        Physical Exam: Vital Signs Blood pressure 118/62, pulse 87, temperature 98.9 F (37.2 C), resp. rate 17, height 5\' 7"  (1.702 m), weight 81.4 kg, SpO2 97 %.       General: awake, alert, appropriate, NAD; sitting up in bed;  HENT: conjugate gaze; oropharynx moist CV: regular rate and rhythm; no JVD Pulmonary: CTA B/L; no W/R/R- good air movement GI: soft, NT, ND, (+)BS Psychiatric: appropriate, but more flat, depressed/sad Neurological: Ox3 GU: has foley in place- chronic foley.  Skin: intact, but 2-3+ LE edema B/L- also feet are very callused with cracking, severe Also has MASD spot in R thigh crease- superficial skin breakdown not pressure ulcer Musculoskeletal:     Cervical back: Normal range of motion and neck supple.     Comments: Left knee pain with flexion.  Left knee with tenderness and mild edema Skin:    General: Skin is warm and dry.     Comments: Tinea pedis bilaterally with cracked right heel and thick toe nails.    Neurological: Ox3 Strong brisk hoffman's B/L- MAS of 1+ in UEs and trace to 1 in LEs.  Motor: Bilateral upper extremities: Shoulder abduction, elbow flexion/extension 4/5, wrist extension 3+/5, and intrinsics 2/5 Right lower extremity: Hip flexion, knee extension  2+/5, ankle dorsiflexion 4/5 Left lower extremity: Hip flexion, knee extension 3 -/5, ankle dorsiflexion 4/5 Sensation to light touch diminished distal to elbows and knees    Assessment/Plan: 1. Functional deficits which require 3+ hours per day of interdisciplinary therapy in a comprehensive inpatient rehab setting.  Physiatrist is providing close team supervision and 24 hour management of active medical problems listed below.  Physiatrist and rehab team continue to assess barriers to discharge/monitor patient progress toward functional and medical goals  Care Tool:  Bathing    Body parts bathed by patient: Right arm,Left arm,Chest,Abdomen,Right upper leg,Left upper leg,Face,Front perineal area   Body parts bathed by helper: Buttocks,Right lower leg,Left lower leg     Bathing assist Assist Level: Moderate Assistance - Patient 50 - 74%     Upper Body Dressing/Undressing Upper body dressing   What is the patient wearing?: Pull over shirt    Upper body assist Assist Level: Contact Guard/Touching assist    Lower Body Dressing/Undressing Lower body dressing      What is the patient wearing?: Pants     Lower body assist Assist for lower body dressing: Maximal Assistance - Patient 25 -  49%     Toileting Toileting    Toileting assist Assist for toileting: Maximal Assistance - Patient 25 - 49%     Transfers Chair/bed transfer  Transfers assist     Chair/bed transfer assist level: Minimal Assistance - Patient > 75%     Locomotion Ambulation   Ambulation assist   Ambulation activity did not occur: Safety/medical concerns  Assist level: Minimal Assistance - Patient > 75% Assistive device: Walker-rolling Max distance: 65   Walk 10 feet activity   Assist  Walk 10 feet activity did not occur: Safety/medical concerns  Assist level: Minimal Assistance - Patient > 75% Assistive device: Walker-rolling   Walk 50 feet activity   Assist Walk 50 feet with 2  turns activity did not occur: Safety/medical concerns    Assistive device: Walker-rolling    Walk 150 feet activity   Assist Walk 150 feet activity did not occur: Safety/medical concerns         Walk 10 feet on uneven surface  activity   Assist Walk 10 feet on uneven surfaces activity did not occur: Safety/medical concerns         Wheelchair     Assist Will patient use wheelchair at discharge?: Yes Type of Wheelchair: Manual    Wheelchair assist level: Minimal Assistance - Patient > 75% Max wheelchair distance: 79'    Wheelchair 50 feet with 2 turns activity    Assist        Assist Level: Minimal Assistance - Patient > 75%   Wheelchair 150 feet activity     Assist      Assist Level: Supervision/Verbal cueing   Blood pressure 118/62, pulse 87, temperature 98.9 F (37.2 C), resp. rate 17, height 5\' 7"  (1.702 m), weight 81.4 kg, SpO2 97 %.  Medical Problem List and Plan: 1.  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             -patient may shower             -ELOS/Goals: 14 to 17 days/supervision/min a            Continue CIR 2.  Antithrombotics:  -DVT/anticoagulation:  Pharmaceutical: Continue Lovenox  2/17- Very high risk of DVT due to SCI-will need 2-3 months of Lovenox per CHEST guidelines.  2/20: discussed with patient that vascular ultrasound is not available on weekends but that we can order upper extremity ultrasounds to assess for clots on Monday 2/21- based on exam, is B/L- above and below elbow itself- loks more like allergic rxn, but not hives than DVT- if gets worse, will order Dopplers  3/4- needs 2-3 Months of Lovenox base don distance walking.              -antiplatelet therapy: N/A 3. Pain Management: Continue Oxycodone prn.  2/21- pt reports pain is doing a little better- con't regimen  2/24- nerve pain still an issue, but had  swelling with Lyrica, even at 75 mg TID-  Had to decrease to 25 mg TID, so now not that helpful. Will add Duloxetine 30 mg QHS for now and if tolerate,d increase dose.  3/3- off Lyrica- on Duloxetine 60 mg QHS- con't regimen             Monitor with increased exertion, particularly neuropathic pain 4. Mood: LCSW to follow for evaluation and support.   3/3- will get pt Neuropsychology  3/4- d/w Neuropsych to see if they  can see again.              -antipsychotic agents: N/A 5. Neuropsych: This patient is capable of making decisions on her own behalf. 6. Skin/Wound Care:  Monitor incision for healing.  Added protein supplement to help promote healing.  7. Fluids/Electrolytes/Nutrition: Monitor I/Os.              Monitor CMP 8. ABLA:              Monitor CBC 9. Hypokalemia/Hypomagnesemia: Has resolved with brief supplementation.              2/22- K+ 4.0 and last Mg 1.9- con't regimen  2/28: K+ reviewed, normalized to 4.2  3/4- labs again Monday 10. Macrocytosis/Polysubstance abuse:              Vitamin B12/folate levels ordered.   217- counseling given about polysubstance abuse 11. Neurogenic bowel and constipation: KUB ordered to determine stool burden.  2/17- moderate R sided stool burden with a lot of contrast left in colon- will order bowel program with dig stim nightly and con't Miralax BID for now- she has no control of stool per pt.   2/18- had 5+ BMs last night- feels cleaned out- now- kept saying had more in her- but now feels "great'.   2/21- having BMs with bowel program- will ask nursing to get done by 9pm, if possible  2/25- per pt, had BM on toilet last night- not clear if after bowel program- con't regimen and monitor  2/26- no suppository last night, since nursing decided with multiple BMs yesterday to only do dig stim- no results, but small BM on toilet this AM. COn't bowel program nightly  2/27- had bowel program last night- was successful per note- had results.   3/1- had  3 large BMs last night with bowel program  3/2- since going on toilet, will stop bowel program  3/3- LBM yesterday - having a lot of gas- d/w pt using fewer straws, but check with SLP first.  12. Neurogenic bladder  2/17- PVRs 0-100 cc so far- not on Flomax- con't regimen for now- has no control of bladder, of note.   2/24- added Myrbetriq 25 mg daily- no improvement so far- still wet all the time- basically zero PVRs- so trying timed voiding q2 hours.  2/25- no improvement so far with Myrbetriq and had (+) high PVRs yesterday -will stop Myrbetriq and do Flomax 0.4 mg qsupper and send to Urology outpt.   2/26- Flomax already helping bladder- less frequent urination and a little more control- con't Regimen  2/27- back to being "crazy" per pt- going very frequently- will con't and if no better Tuesday, will increase to 0.8 mg nightly   3/1- will check U/A and Cx- has been a few weeks- just to make sure- if negative, will d/w pt about foley since d/w Urology- they want us to start Vesicare AND Myrbetriq, get her into retention and cath, but she doesn't have good hand function, I'm concerned she won't be able to cath.   3/2- discussed foley vs being wet vs Vesicare/Myrbetriq- she wants to do foley- for now- until f/u with Urology.  3/3- foley placed- had a little leak last night- con't foley at this time.   3/4- (-) U/A basically- small leukocytes. con't foley  13. Neuropathic pain  2/17- start Lyrica 50 mg TID for nerve pain- hopefully will help "being on fire".   2/18- slightly better today- con't regimen  2/21- Increased Lyrica to 75 mg TID since no side effects of meds? But the arm swelling could be from Lyrica?  2/22- improved with decrease in lyrica dose.  2/24- Add Duloxetine 30 mg QHS for nerve pain  2/26- no side effects- con't Duloxetine- will increase Tuesday 3/1   3/2- increased yesterday- con't regimen- stopped Lyrica  3/4- pain better controlled- con't regimen 14. Spasticity  2/17-  will start Baclofen 5 mg QID for muscle tightness/spasms.     2/24- spasms well controlled now- con't Baclofen  2/25- spasms controlled, but getting a little tighter- will con't baclofen and con't to monitor 15. Dry skin: Eucerin ordered, encouraged use.  16. B/L elbow/arm swelling  2/21- refused a medicine last night- could be cause of arm swelling. Will look into this. Held Lyrica last night- and swelling better- will d/w pt- asked her to refuse it today.    2/22- Lyrica decreased to 25 mg TID- swelling better  3/2- d/c Lyrica 17. Dysphagia  D2 thins diet  2/22- con't regimen until upgraded by SLP.  18. Urinary urgency/frequency  2/22- will try Myrbetriq 25 mg QHS for urinary frequency- will also check PVRs with addition of new meds, so not retaining, hopefully.   2/24- as per Neurogenic bladder  2/26- added Flomax yesterday- working [facility] better- con't Flomax   2/28: continue Flomax.   3/2- will d/c flomax and place foley since constant voiding.   3/3- is dry for first time since SCI- pt happy about this.  19. LE edema  2/25- will start TEDs 8am to 6pm daily for LE edema.  3/1- will stop Lyrica- could be adding to Swelling 20. Severe calluses on Feet  2/27- add order that will have feet warmed with warm wet washcloth, then lotion applied, then vaseline over that BID- to help cracks/calluses.      LOS: 16 days A FACE TO FACE EVALUATION WAS PERFORMED  Megan Lovorn 07/27/2020, 8:30 AM

## 2020-07-27 NOTE — Progress Notes (Signed)
Physical Therapy Session Note  Patient Details  Name: Yvette Phillips MRN: [id number] Date of Birth: [birth year]  Today's Date: 07/27/2020 PT Individual Time: 1030-1100 and 1510-1540 PT Individual Time Calculation (min): 30 min and 30 min  Short Term Goals: Week 3:  PT Short Term Goal 1 (Week 3): Pt will perform bed mobility with supervision. PT Short Term Goal 2 (Week 3): Pt will  ambulate with LRAD and min A x 100 ft. PT Short Term Goal 3 (Week 3): Pt will propel wc with supervision x 100 ft.   Skilled Therapeutic Interventions/Progress Updates:  Session 1: Patient supine in bed upon PT arrival. Patient alert and agreeable to PT session. Patient denied pain during session.  Therapeutic Activity: Bed Mobility: Patient performed supine --> sit with ability to bring BLE to EOB with supervision and encouragement. HHA to L hand and pt able to pull with her LUE and push with RUE to bring self to upright seated position on EOB. VC for technique.  Transfers: Patient performed STS using STEDY with supervision. Provided verbal cues for technique.   Sitting and standing balance challenged with dressing activity while seated bedside and with use of STEDY. Pt requires Max A for threading of foley for LB dressing but then is able to pull pants up with Min A at [facility]. Min A for UB dressing d/t shoulder soreness. Good balance noted throughout sitting and standing with UUE support.    Wheelchair Mobility:  Patient propelled wheelchair 125 feet with BUE and supervision. Completed Max A to day room for time and next session.  Provided vc for maintaining straight path.  Patient seated in w/c at end of session in day room with brakes locked and awaiting group drumming session.   Session 2:  Patient seated in w/c upon PT arrival. Patient alert and agreeable to PT session. Pt relates arm soreness from drumming. Education provided re: soreness as an indicator that pt's muscles are growing and increasing in  strength.   Therapeutic Activity: Bed Mobility: At end of session, pt returned to supine for rest at end of therapy day. She is guided in sit--> sidelying requiring Min A for BLE to bed surface and then supervision for roll to supine. Requires Min A to reposition toward [facility] with pt able to use BLE to push in hooklying position. Provided verbal cues for technique.  Transfers: Patient performed blocked practice of STS w/c <> RW x5 at start of session. She is able to improve from mod A using split UE positioning to CGA with BUE push-to-stand techinque by the end of blocked practice.  At end of session, she performed 5x STS. Still requires momentum build with 3xrocking prior to lift and completes with CGA in 42 sec.  Provided verbal cues fortechnique and hand progression.  Wheelchair Mobility:  Patient propelled wheelchair about room and guided in increasing mobility with education on spin in space and making tighter turns with pt able to perform with supervision by end of session. She is able to perform 3 point turn to back in to space, perform 180 deg turn in space and maneuver tighter spaces within room to prepare for discharge.   Patient supine in bed at end of session with brakes locked, bed alarm set, and all needs within reach.   Therapy Documentation Precautions:  Precautions Precautions: Fall,Cervical Precaution Comments: s/p ACDF C4-5 (no cervical brace needed per acute), BUE/LE weakness Restrictions Weight Bearing Restrictions: No General:   Vital Signs:   Pain: Pain Assessment  Pain Score: 0-No pain Mobility:   Locomotion :    Trunk/Postural Assessment :    Balance:   Exercises:   Other Treatments:      Therapy/Group: Individual Therapy  Loel Dubonnet 07/27/2020, 12:53 PM

## 2020-07-27 NOTE — Progress Notes (Signed)
Modified Barium Swallow Progress Note  Patient Details  Name: Yvette Phillips MRN: [id number] Date of Birth: 04/11/1961  Today's Date: 07/27/2020  Modified Barium Swallow completed.  Full report located under Chart Review in the Imaging Section.  Brief recommendations include the following:  Clinical Impression   Pt presents with mild pharyngeal dysphagia, but functional improvements since last MBSS conducted on 07/13/20. When comparing images from last MBSS to today's study images, it appears that edema s/p pt's recent ACDF procedure has improved, and was likely a partial contributor to pharyngeal residues of solids during last study. The presence of ACDF hardware appears to still reduce the size of pyriform sinuses and overall pharyngeal space, and in turn either restrict the opening of the upper esophageal sphincter (UES) and/or reduce pharyngeal squeeze during passage of boluses. However, overall minimal residue of dysphagia 3 solids textures was noted, and it was effectively cleared (~90%) with use of liquid washes of thin barium, and by pt's sensed need/spontaeous trigger of an additional dry swallow. Pt has demonstrated ability to use these strategies with Supervision A verbal cues during trials at bedside, in addition to independent use of slow rate and small bite size. She has not been able to demonstrate carryover/effective use of other strategies to reduce pharyngeal residue such as a chin tuck. Therefore, the main purpose of today's study was to assess safety of solid advancement using only the strategies for which she has demonstrated carryover. Given results of today's study, SLP would recommend pt upgrade to dysphagia 3 solid textures (mechanical soft), continue thin liquids, alternate solids with liquids, perform extra dry swallows, slow rate, small bites/sips. Please continue with medications crushed in puree, as whole pills in applesauce were slow to pass through pharynx and esophagus upon  brief scan (although no radiologist was present to confirm any esophageal dysfunction). SLP will continue to follow up with pt briefly to ensure tolerance of upgraded diet and use of compensatory strategies.    Swallow Evaluation Recommendations   Recommended Consults: Consider esophageal assessment   SLP Diet Recommendations: Dysphagia 3 (Mech soft) solids;Thin liquid   Liquid Administration via: Cup;Straw   Medication Administration: Crushed with puree   Supervision: Patient able to self feed   Compensations: Slow rate;Small sips/bites;Follow solids with liquid;Multiple dry swallows after each bite/sip   Postural Changes: Remain semi-upright after after feeds/meals (Comment);Seated upright at 90 degrees   Oral Care Recommendations: Oral care BID        Little Ishikawa 07/27/2020,10:19 AM

## 2020-07-27 NOTE — Progress Notes (Signed)
Occupational Therapy Session Note  Patient Details  Name: Yvette Phillips MRN: [id number] Date of Birth: 26-Dec-1960  Today's Date: 07/27/2020 OT Individual Time: 1400-1426 OT Individual Time Calculation (min): 26 min   Skilled Therapeutic Interventions/Progress Updates:    Pt greeted in the recliner and premedicated for nerve pain in both hands. ADL needs were met, pt agreeable to work on improving the function of her hands, therapeutic focus placed on strengthening of hand intrinsics to increase ease of using reacher during self care activities. Set her up to complete a graded lego activity involving design assembly using set of visual instructions. Pt required vcs to assemble the exact design shown in the pictures and also to increase force of pressure when securing lego pieces together. At end of session pt was left with all needs within reach, safety belt fastened, and 3 exercise programs to use in the room for improving hand strength/coordination.    Therapy Documentation Precautions:  Precautions Precautions: Fall,Cervical Precaution Comments: s/p ACDF C4-5 (no cervical brace needed per acute), BUE/LE weakness Restrictions Weight Bearing Restrictions: No ADL: ADL Equipment Provided: Feeding equipment Eating: Not assessed (not fully assessed, does have built up handles and able to simulate) Grooming: Moderate assistance Upper Body Bathing: Minimal assistance Where Assessed-Upper Body Bathing: Sitting at sink Lower Body Bathing: Maximal assistance Where Assessed-Lower Body Bathing: Sitting at sink,Standing at sink Upper Body Dressing: Moderate assistance Lower Body Dressing: Dependent Where Assessed-Lower Body Dressing: Sitting at sink,Standing at sink Toileting: Not assessed Toilet Transfer: Maximal assistance      Therapy/Group: Individual Therapy  Parrie Rasco A Judythe Postema 07/27/2020, 4:01 PM

## 2020-07-27 NOTE — Progress Notes (Signed)
Occupational Therapy Session Note  Patient Details  Name: Liviana Mills MRN: [id number] Date of Birth: December 24, 1960  Today's Date: 07/27/2020 OT Group Time: 1110-1200 OT Group Time Calculation (min): 50 min    Short Term Goals: Week 3:  OT Short Term Goal 1 (Week 3): Pt will perform LB dressing with Mod A OT Short Term Goal 2 (Week 3): Pt will increase functional grip strength to perform oral hygiene w/ Supervision OT Short Term Goal 3 (Week 3): Pt will complete BSC/toilet transfers with CGA using LRAD OT Short Term Goal 4 (Week 3): Pt will stand with one hand support for 2+ minutes in order to complete LB ADL  Skilled Therapeutic Interventions/Progress Updates:    Pt participated in rhythmic drumming group. Pain not reported or indicated throughtout session.  Focus of group on BUE coordination, strengthening, endurance, timing/control, activity tolerance, and social participation and engagement. Pt performs session from seated position for energy conservation. Skilled interventions included grading of arm movements and stretches to abide within precautions and seated position to improve activity tolerance. Warm up performed prior to exercises and UB stretching completed at end of group with demo from OT. Pt able to select preferred song to share with group. Returned pt to room at end of session. Exited session with pt seated in w/c, exit alarm on and call light in reach  Therapy Documentation Precautions:  Precautions Precautions: Fall,Cervical Precaution Comments: s/p ACDF C4-5 (no cervical brace needed per acute), BUE/LE weakness Restrictions Weight Bearing Restrictions: No General:   Vital Signs: Therapy Vitals Temp: 98.9 F (37.2 C) Pulse Rate: 87 Resp: 17 BP: 118/62 Patient Position (if appropriate): Lying Oxygen Therapy SpO2: 97 % Pain:   ADL: ADL Equipment Provided: Feeding equipment Eating: Not assessed (not fully assessed, does have built up handles and able to  simulate) Grooming: Moderate assistance Upper Body Bathing: Minimal assistance Where Assessed-Upper Body Bathing: Sitting at sink Lower Body Bathing: Maximal assistance Where Assessed-Lower Body Bathing: Sitting at sink,Standing at sink Upper Body Dressing: Moderate assistance Lower Body Dressing: Dependent Where Assessed-Lower Body Dressing: Sitting at sink,Standing at sink Toileting: Not assessed Toilet Transfer: Maximal assistance Vision   Perception    Praxis   Exercises:   Other Treatments:     Therapy/Group: Group Therapy  Shon Hale 07/27/2020, 6:51 AM

## 2020-07-28 NOTE — Progress Notes (Signed)
PROGRESS NOTE   Subjective/Complaints:  No issues overnite  Has foley, RN in room, notes BM yesterday  No pain c/os breathing ok   ROS:  Pt denies SOB, abd pain, CP, N/V/C/D, and vision changes  Objective:   DG Swallowing Func-Speech Pathology  Result Date: 07/27/2020 Objective Swallowing Evaluation: Type of Study: Bedside Swallow Evaluation  Patient Details Name: Yvette Phillips MRN: [id number] Date of Birth: [birth year] Today's Date: 07/27/2020 Past Medical History: No past medical history on file. Past Surgical History: Past Surgical History: Procedure Laterality Date . ANTERIOR CERVICAL DECOMP/DISCECTOMY FUSION N/A 07/04/2020  Procedure: ANTERIOR CERVICAL DECOMPRESSION/DISCECTOMY FUSION 1 LEVEL;  Surgeon: Venetia Night, MD;  Location: [facility] ORS;  Service: Neurosurgery;  Laterality: N/A; HPI: 60yo female admitted 07/03/20 with increasing bilateral foot/leg pain, frequent falls. MRI: central cord syndrome, nicotine dependence, polysubstance abuse, alcohol abuse. MRI = large central disc protrusion at C4-C5, severe spinal canal stenosis, compressive myelopathy. ACDF 07/04/20. Intubated during surgery. SLP admitted to [facility] 07/11/20.  Subjective: Pt seated in recliner, awake, pleasant and cooperative. Assessment / Plan / Recommendation CHL IP CLINICAL IMPRESSIONS 07/27/2020 Clinical Impression --Pt presents with mild pharyngeal dysphagia, but functional improvements since last MBSS conducted on 07/13/20. When comparing images from last MBSS to today's study images, it appears that edema s/p pt's recent ACDF procedure has improved, and was likely a partial contributor to pharyngeal residues of solids during last study. The presence of ACDF hardware appears to still reduce the size of pyriform sinuses and overall pharyngeal space, and in turn either restrict the opening of the upper esophageal sphincter (UES) and/or reduce pharyngeal squeeze during passage of  boluses. However, overall minimal residue of dysphagia 3 solids textures was noted, and it was effectively cleared (~90%) with use of liquid washes of thin barium, and by pt's sensed need/spontaeous trigger of an additional dry swallow. Pt has demonstrated ability to use these strategies with Supervision A verbal cues during trials at bedside, in addition to independent use of slow rate and small bite size. She has not been able to demonstrate carryover/effective use of other strategies to reduce pharyngeal residue such as a chin tuck. Therefore, the main purpose of today's study was to assess safety of solid advancement using only the strategies for which she has demonstrated carryover. Given results of today's study, SLP would recommend pt upgrade to dysphagia 3 solid textures (mechanical soft), continue thin liquids, alternate solids with liquids, perform extra dry swallows, slow rate, small bites/sips. Please continue with medications crushed in puree, as whole pills in applesauce were slow to pass through pharynx and esophagus upon brief scan (although no radiologist was present to confirm any esophageal dysfunction). SLP will continue to follow up with pt briefly to ensure tolerance of upgraded diet and use of compensatory strategies.  SLP Visit Diagnosis Dysphagia, pharyngeal phase (R13.13) Attention and concentration deficit following -- Frontal lobe and executive function deficit following -- Impact on safety and function Mild aspiration risk   CHL IP TREATMENT RECOMMENDATION 07/13/2020 Treatment Recommendations Therapy as outlined in treatment plan below   Prognosis 07/06/2020 Prognosis for Safe Diet Advancement Fair Barriers to Reach Goals -- Barriers/Prognosis Comment -- CHL IP DIET RECOMMENDATION 07/27/2020 SLP  Diet Recommendations Dysphagia 3 (Mech soft) solids;Thin liquid Liquid Administration via Cup;Straw Medication Administration Crushed with puree Compensations Slow rate;Small sips/bites;Follow solids  with liquid;Multiple dry swallows after each bite/sip Postural Changes Remain semi-upright after after feeds/meals (Comment);Seated upright at 90 degrees   CHL IP OTHER RECOMMENDATIONS 07/27/2020 Recommended Consults Consider esophageal assessment Oral Care Recommendations Oral care BID Other Recommendations --   CHL IP FOLLOW UP RECOMMENDATIONS 07/07/2020 Follow up Recommendations Inpatient Rehab   CHL IP FREQUENCY AND DURATION 07/06/2020 Speech Therapy Frequency (ACUTE ONLY) min 1 x/week Treatment Duration 1 week;2 weeks      CHL IP ORAL PHASE 07/27/2020 Oral Phase -- Oral - Pudding Teaspoon -- Oral - Pudding Cup -- Oral - Honey Teaspoon -- Oral - Honey Cup -- Oral - Nectar Teaspoon -- Oral - Nectar Cup -- Oral - Nectar Straw -- Oral - Thin Teaspoon -- Oral - Thin Cup -- Oral - Thin Straw -- Oral - Puree -- Oral - Mech Soft Decreased bolus cohesion Oral - Regular NT Oral - Multi-Consistency -- Oral - Pill -- Oral Phase - Comment --  CHL IP PHARYNGEAL PHASE 07/27/2020 Pharyngeal Phase Impaired Pharyngeal- Pudding Teaspoon -- Pharyngeal -- Pharyngeal- Pudding Cup -- Pharyngeal -- Pharyngeal- Honey Teaspoon -- Pharyngeal -- Pharyngeal- Honey Cup -- Pharyngeal -- Pharyngeal- Nectar Teaspoon -- Pharyngeal -- Pharyngeal- Nectar Cup -- Pharyngeal -- Pharyngeal- Nectar Straw -- Pharyngeal -- Pharyngeal- Thin Teaspoon -- Pharyngeal -- Pharyngeal- Thin Cup Pharyngeal residue - pyriform Pharyngeal Material does not enter airway Pharyngeal- Thin Straw Pharyngeal residue - pyriform Pharyngeal -- Pharyngeal- Puree Reduced pharyngeal peristalsis;Pharyngeal residue - pyriform;Compensatory strategies attempted (with notebox) Pharyngeal -- Pharyngeal- Mechanical Soft Pharyngeal residue - posterior pharnyx;Pharyngeal residue - pyriform;Compensatory strategies attempted (with notebox) Pharyngeal -- Pharyngeal- Regular NT Pharyngeal -- Pharyngeal- Multi-consistency -- Pharyngeal -- Pharyngeal- Pill WFL Pharyngeal -- Pharyngeal Comment --  CHL  IP CERVICAL ESOPHAGEAL PHASE 07/27/2020 Cervical Esophageal Phase Impaired Pudding Teaspoon -- Pudding Cup -- Honey Teaspoon -- Honey Cup -- Nectar Teaspoon -- Nectar Cup -- Nectar Straw -- Thin Teaspoon -- Thin Cup -- Thin Straw -- Puree Other (Comment) Mechanical Soft Other (Comment) Regular NT Multi-consistency -- Pill Other (Comment) Cervical Esophageal Comment see impression [place]arin E Smith 07/27/2020, 10:20 AM              No results for input(s): WBC, HGB, HCT, PLT in the last 72 hours. No results for input(s): NA, K, CL, CO2, GLUCOSE, BUN, CREATININE, CALCIUM in the last 72 hours.  Intake/Output Summary (Last 24 hours) at 07/28/2020 0829 Last data filed at 07/28/2020 0815 Gross per 24 hour  Intake 1050 ml  Output 1500 ml  Net -450 ml        Physical Exam: Vital Signs Blood pressure 119/79, pulse 84, temperature 97.8 F (36.6 C), temperature source Oral, resp. rate 20, height 5\' 7"  (1.702 m), weight 81.4 kg, SpO2 98 %.  General: No acute distress Mood and affect are appropriate Heart: Regular rate and rhythm no rubs murmurs or extra sounds Lungs: Clear to auscultation, breathing unlabored, no rales or wheezes Abdomen: Positive bowel sounds, soft nontender to palpation, nondistended Extremities: No clubbing, cyanosis, or edema Skin: No evidence of breakdown, no evidence of rash   Musculoskeletal:     Cervical back: Normal range of motion and neck supple.     Comments: Left knee pain with flexion.  Left knee with tenderness and mild edema Skin:    General: Skin is warm and dry.     Comments: Tinea  pedis bilaterally with cracked right heel and thick toe nails.    Neurological: Ox3 Strong brisk hoffman's B/L- MAS of 1+ in UEs and trace to 1 in LEs.  Motor: Bilateral upper extremities: Shoulder abduction, elbow flexion/extension 4/5, wrist extension 3+/5, and intrinsics 2/5 Right lower extremity: Hip flexion, knee extension 2+/5, ankle dorsiflexion 4/5 Left lower extremity: Hip  flexion, knee extension 3 -/5, ankle dorsiflexion 4/5 Sensation to light touch diminished distal to elbows and knees    Assessment/Plan: 1. Functional deficits which require 3+ hours per day of interdisciplinary therapy in a comprehensive inpatient rehab setting.  Physiatrist is providing close team supervision and 24 hour management of active medical problems listed below.  Physiatrist and rehab team continue to assess barriers to discharge/monitor patient progress toward functional and medical goals  Care Tool:  Bathing    Body parts bathed by patient: Right arm,Left arm,Chest,Abdomen,Right upper leg,Left upper leg,Face,Front perineal area   Body parts bathed by helper: Buttocks,Right lower leg,Left lower leg     Bathing assist Assist Level: Moderate Assistance - Patient 50 - 74%     Upper Body Dressing/Undressing Upper body dressing   What is the patient wearing?: Pull over shirt    Upper body assist Assist Level: Contact Guard/Touching assist    Lower Body Dressing/Undressing Lower body dressing      What is the patient wearing?: Pants     Lower body assist Assist for lower body dressing: Maximal Assistance - Patient 25 - 49%     Toileting Toileting    Toileting assist Assist for toileting: Maximal Assistance - Patient 25 - 49%     Transfers Chair/bed transfer  Transfers assist     Chair/bed transfer assist level: Minimal Assistance - Patient > 75%     Locomotion Ambulation   Ambulation assist   Ambulation activity did not occur: Safety/medical concerns  Assist level: Minimal Assistance - Patient > 75% Assistive device: Walker-rolling Max distance: 65   Walk 10 feet activity   Assist  Walk 10 feet activity did not occur: Safety/medical concerns  Assist level: Minimal Assistance - Patient > 75% Assistive device: Walker-rolling   Walk 50 feet activity   Assist Walk 50 feet with 2 turns activity did not occur: Safety/medical concerns     Assistive device: Walker-rolling    Walk 150 feet activity   Assist Walk 150 feet activity did not occur: Safety/medical concerns         Walk 10 feet on uneven surface  activity   Assist Walk 10 feet on uneven surfaces activity did not occur: Safety/medical concerns         Wheelchair     Assist Will patient use wheelchair at discharge?: Yes Type of Wheelchair: Manual    Wheelchair assist level: Minimal Assistance - Patient > 75% Max wheelchair distance: 9'    Wheelchair 50 feet with 2 turns activity    Assist        Assist Level: Minimal Assistance - Patient > 75%   Wheelchair 150 feet activity     Assist      Assist Level: Supervision/Verbal cueing   Blood pressure 119/79, pulse 84, temperature 97.8 F (36.6 C), temperature source Oral, resp. rate 20, height  (1.702 m), weight 81.4 kg, SpO2 98 %.  Medical Problem List and Plan: 1.  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             -  patient may shower             -ELOS/Goals: 14 to 17 days/supervision/min a            Continue CIR PT, OT, SLP 2.  Antithrombotics:  -DVT/anticoagulation:  Pharmaceutical: Continue Lovenox  2/17- Very high risk of DVT due to SCI-will need 2-3 months of Lovenox per CHEST guidelines.  2/20: discussed with patient that vascular ultrasound is not available on weekends but that we can order upper extremity ultrasounds to assess for clots on Monday 2/21- based on exam, is B/L- above and below elbow itself- loks more like allergic rxn, but not hives than DVT- if gets worse, will order Dopplers  3/4- needs 2-3 Months of Lovenox base don distance walking.              -antiplatelet therapy: N/A 3. Pain Management: Continue Oxycodone prn.  2/21- pt reports pain is doing a little better- con't regimen  2/24- nerve pain still an issue, but had swelling with  Lyrica, even at 75 mg TID-  Had to decrease to 25 mg TID, so now not that helpful. Will add Duloxetine 30 mg QHS for now and if tolerate,d increase dose.  3/3- off Lyrica- on Duloxetine 60 mg QHS- con't regimen             Monitor with increased exertion, particularly neuropathic pain 4. Mood: LCSW to follow for evaluation and support.   3/3- will get pt Neuropsychology  3/4- d/w Neuropsych to see if they can see again.              -antipsychotic agents: N/A 5. Neuropsych: This patient is capable of making decisions on her own behalf. 6. Skin/Wound Care:  Monitor incision for healing.  Added protein supplement to help promote healing.  7. Fluids/Electrolytes/Nutrition: Monitor I/Os.              Monitor CMP 8. ABLA:              Monitor CBC 9. Hypokalemia/Hypomagnesemia: Has resolved with brief supplementation.              2/22- K+ 4.0 and last Mg 1.9- con't regimen  2/28: K+ reviewed, normalized to 4.2  3/4- labs again Monday 10. Macrocytosis/Polysubstance abuse:              Vitamin B12/folate levels ordered.   217- counseling given about polysubstance abuse 11. Neurogenic bowel and constipation: KUB ordered to determine stool burden.  2/17- moderate R sided stool burden with a lot of contrast left in colon- will order bowel program with dig stim nightly and con't Miralax BID for now- she has no control of stool per pt.   2/18- had 5+ BMs last night- feels cleaned out- now- kept saying had more in her- but now feels "great'.   2/21- having BMs with bowel program- will ask nursing to get done by 9pm, if possible  2/25- per pt, had BM on toilet last night- not clear if after bowel program- con't regimen and monitor  2/26- no suppository last night, since nursing decided with multiple BMs yesterday to only do dig stim- no results, but small BM on toilet this AM. COn't bowel program nightly  2/27- had bowel program last night- was successful per note- had results.   3/1- had 3 large BMs  last night with bowel program  3/2- since going on toilet, will stop bowel program  3/3- LBM yesterday - having a lot of gas-  d/w pt using fewer straws, but check with SLP first.  12. Neurogenic bladder  2/17- PVRs 0-100 cc so far- not on Flomax- con't regimen for now- has no control of bladder, of note.   2/24- added Myrbetriq 25 mg daily- no improvement so far- still wet all the time- basically zero PVRs- so trying timed voiding q2 hours.  2/25- no improvement so far with Myrbetriq and had (+) high PVRs yesterday -will stop Myrbetriq and do Flomax 0.4 mg qsupper and send to Urology outpt.   2/26- Flomax already helping bladder- less frequent urination and a little more control- con't Regimen  2/27- back to being "crazy" per pt- going very frequently- will con't and if no better Tuesday, will increase to 0.8 mg nightly   3/1- will check U/A and Cx- has been a few weeks- just to make sure- if negative, will d/w pt about foley since d/w Urology- they want [place] to start Vesicare AND Myrbetriq, get her into retention and cath, but she doesn't have good hand function, I'm concerned she won't be able to cath.   3/2- discussed foley vs being wet vs Vesicare/Myrbetriq- she wants to do foley- for now- until f/u with Urology.  3/3- foley placed- had a little leak last night- con't foley at this time.   3/4- (-) U/A basically- small leukocytes. con't foley  3/5 cont foley cath  13. Neuropathic pain  2/17- start Lyrica 50 mg TID for nerve pain- hopefully will help "being on fire".   2/18- slightly better today- con't regimen  2/21- Increased Lyrica to 75 mg TID since no side effects of meds? But the arm swelling could be from Lyrica?  2/22- improved with decrease in lyrica dose.  2/24- Add Duloxetine 30 mg QHS for nerve pain  2/26- no side effects- con't Duloxetine- will increase Tuesday 3/1   3/2- increased yesterday- con't regimen- stopped Lyrica  3/5 no issues cont duloxetine 14. Spasticity  2/17- will  start Baclofen 5 mg QID for muscle tightness/spasms.     2/24- spasms well controlled now- con't Baclofen  2/25- spasms controlled, but getting a little tighter- will con't baclofen and con't to monitor 3/5 controlled on current regimen 15. Dry skin: Eucerin ordered, encouraged use.  16. B/L elbow/arm swelling  2/21- refused a medicine last night- could be cause of arm swelling. Will look into this. Held Lyrica last night- and swelling better- will d/w pt- asked her to refuse it today.    2/22- Lyrica decreased to 25 mg TID- swelling better  3/2- d/c Lyrica 17. Dysphagia  D2 thins diet  2/22- con't regimen until upgraded by SLP.  18. Urinary urgency/frequency  2/22- will try Myrbetriq 25 mg QHS for urinary frequency- will also check PVRs with addition of new meds, so not retaining, hopefully.   2/24- as per Neurogenic bladder  2/26- added Flomax yesterday- working [facility] better- con't Flomax   2/28: continue Flomax.   3/2- will d/c flomax and place foley since constant voiding.   3/3- is dry for first time since SCI- pt happy about this.  19. LE edema  2/25- will start TEDs 8am to 6pm daily for LE edema.  3/1- will stop Lyrica- could be adding to Swelling 20. Severe calluses on Feet  2/27- add order that will have feet warmed with warm wet washcloth, then lotion applied, then vaseline over that BID- to help cracks/calluses.      LOS: 17 days A FACE TO FACE EVALUATION WAS PERFORMED  Greig Castilla  E Bertie Mcconathy 07/28/2020, 8:29 AM

## 2020-07-28 NOTE — Plan of Care (Signed)
  Problem: SCI BLADDER ELIMINATION Goal: RH STG MANAGE BLADDER WITH ASSISTANCE Description: STG Manage Bladder With Mod I Assistance Outcome: Not Progressing; foley cath

## 2020-07-29 NOTE — Plan of Care (Signed)
  Problem: SCI BLADDER ELIMINATION Goal: RH STG MANAGE BLADDER WITH ASSISTANCE Description: STG Manage Bladder With Mod I Assistance Outcome: Not Progressing; foley cath   

## 2020-07-29 NOTE — Progress Notes (Signed)
Occupational Therapy Session Note  Patient Details  Name: Yvette Phillips MRN: [id number] Date of Birth: 07-21-1960  Today's Date: 07/29/2020 OT Individual Time: 6222-9798 OT Individual Time Calculation (min): 40 min  and Today's Date: 07/29/2020 OT Group Time: 1100-1200 OT Group Time Calculation (min): 60 min  Skilled Therapeutic Interventions/Progress Updates:    Pt greeted while seated on the toilet, secured in [place] lift. She had just voided bowels, large BM with soiling on her legs. Increased time and multiple sit<stands required for hygiene completion and then brief change. Pt completed sit<stands from low BSC and higher paddles with close supervision. She assisted OT with hygiene in the back but ultimately needed Mod A for thoroughness. Pt reported feeling very fatigued by the time she transferred to the w/c (after handwashing while semi perched at the sink). She did not want to try using the reacher due to fatigue, Max A for threading LEs into pants and Mod A for doffing/donning shirt. Sit<stand completed with Mod A using RW to elevate pants. She was then escorted via w/c to dance group.   2nd Session (Group tx) Pt engaged in therapeutic w/c level dance group focusing on patient choice, UE/LE strengthening, salience, activity tolerance, and social participation. Pt was guided through various dance-based exercises involving UEs/LEs and trunk. All music was selected by group members. Emphasis placed on UE/LE NMR. Pt participated well while seated, actively incorporating UB/LB during guided exercise. Affect appeared bright in the social environment. At end of session she was returned to the room by OT.    Therapy Documentation Precautions:  Precautions Precautions: Fall,Cervical Precaution Comments: s/p ACDF C4-5 (no cervical brace needed per acute), BUE/LE weakness Restrictions Weight Bearing Restrictions: No Pain: no c/o pain during either tx session   ADL: ADL Equipment Provided:  Feeding equipment Eating: Not assessed (not fully assessed, does have built up handles and able to simulate) Grooming: Moderate assistance Upper Body Bathing: Minimal assistance Where Assessed-Upper Body Bathing: Sitting at sink Lower Body Bathing: Maximal assistance Where Assessed-Lower Body Bathing: Sitting at sink,Standing at sink Upper Body Dressing: Moderate assistance Lower Body Dressing: Dependent Where Assessed-Lower Body Dressing: Sitting at sink,Standing at sink Toileting: Not assessed Toilet Transfer: Maximal assistance      Therapy/Group: Individual Therapy  Philicia Heyne A Rylei Masella 07/29/2020, 12:33 PM

## 2020-07-29 NOTE — Progress Notes (Signed)
PROGRESS NOTE   Subjective/Complaints:  No issues overnite , had BM without supp yesterday , no breakfast yet this am   ROS:  Pt denies SOB, abd pain, CP, N/V/C/D, and vision changes  Objective:   DG Swallowing Func-Speech Pathology  Result Date: 07/27/2020 Objective Swallowing Evaluation: Type of Study: Bedside Swallow Evaluation  Patient Details Name: Yvette Phillips MRN: [id number] Date of Birth: [birth year] Today's Date: 07/27/2020 Past Medical History: No past medical history on file. Past Surgical History: Past Surgical History: Procedure Laterality Date . ANTERIOR CERVICAL DECOMP/DISCECTOMY FUSION N/A 07/04/2020  Procedure: ANTERIOR CERVICAL DECOMPRESSION/DISCECTOMY FUSION 1 LEVEL;  Surgeon: Venetia Night, MD;  Location: [facility] ORS;  Service: Neurosurgery;  Laterality: N/A; HPI: 60yo female admitted 07/03/20 with increasing bilateral foot/leg pain, frequent falls. MRI: central cord syndrome, nicotine dependence, polysubstance abuse, alcohol abuse. MRI = large central disc protrusion at C4-C5, severe spinal canal stenosis, compressive myelopathy. ACDF 07/04/20. Intubated during surgery. SLP admitted to [facility] 07/11/20.  Subjective: Pt seated in recliner, awake, pleasant and cooperative. Assessment / Plan / Recommendation CHL IP CLINICAL IMPRESSIONS 07/27/2020 Clinical Impression --Pt presents with mild pharyngeal dysphagia, but functional improvements since last MBSS conducted on 07/13/20. When comparing images from last MBSS to today's study images, it appears that edema s/p pt's recent ACDF procedure has improved, and was likely a partial contributor to pharyngeal residues of solids during last study. The presence of ACDF hardware appears to still reduce the size of pyriform sinuses and overall pharyngeal space, and in turn either restrict the opening of the upper esophageal sphincter (UES) and/or reduce pharyngeal squeeze during passage of boluses.  However, overall minimal residue of dysphagia 3 solids textures was noted, and it was effectively cleared (~90%) with use of liquid washes of thin barium, and by pt's sensed need/spontaeous trigger of an additional dry swallow. Pt has demonstrated ability to use these strategies with Supervision A verbal cues during trials at bedside, in addition to independent use of slow rate and small bite size. She has not been able to demonstrate carryover/effective use of other strategies to reduce pharyngeal residue such as a chin tuck. Therefore, the main purpose of today's study was to assess safety of solid advancement using only the strategies for which she has demonstrated carryover. Given results of today's study, SLP would recommend pt upgrade to dysphagia 3 solid textures (mechanical soft), continue thin liquids, alternate solids with liquids, perform extra dry swallows, slow rate, small bites/sips. Please continue with medications crushed in puree, as whole pills in applesauce were slow to pass through pharynx and esophagus upon brief scan (although no radiologist was present to confirm any esophageal dysfunction). SLP will continue to follow up with pt briefly to ensure tolerance of upgraded diet and use of compensatory strategies.  SLP Visit Diagnosis Dysphagia, pharyngeal phase (R13.13) Attention and concentration deficit following -- Frontal lobe and executive function deficit following -- Impact on safety and function Mild aspiration risk   CHL IP TREATMENT RECOMMENDATION 07/13/2020 Treatment Recommendations Therapy as outlined in treatment plan below   Prognosis 07/06/2020 Prognosis for Safe Diet Advancement Fair Barriers to Reach Goals -- Barriers/Prognosis Comment -- CHL IP DIET RECOMMENDATION 07/27/2020 SLP Diet Recommendations Dysphagia  3 (Mech soft) solids;Thin liquid Liquid Administration via Cup;Straw Medication Administration Crushed with puree Compensations Slow rate;Small sips/bites;Follow solids with  liquid;Multiple dry swallows after each bite/sip Postural Changes Remain semi-upright after after feeds/meals (Comment);Seated upright at 90 degrees   CHL IP OTHER RECOMMENDATIONS 07/27/2020 Recommended Consults Consider esophageal assessment Oral Care Recommendations Oral care BID Other Recommendations --   CHL IP FOLLOW UP RECOMMENDATIONS 07/07/2020 Follow up Recommendations Inpatient Rehab   CHL IP FREQUENCY AND DURATION 07/06/2020 Speech Therapy Frequency (ACUTE ONLY) min 1 x/week Treatment Duration 1 week;2 weeks      CHL IP ORAL PHASE 07/27/2020 Oral Phase -- Oral - Pudding Teaspoon -- Oral - Pudding Cup -- Oral - Honey Teaspoon -- Oral - Honey Cup -- Oral - Nectar Teaspoon -- Oral - Nectar Cup -- Oral - Nectar Straw -- Oral - Thin Teaspoon -- Oral - Thin Cup -- Oral - Thin Straw -- Oral - Puree -- Oral - Mech Soft Decreased bolus cohesion Oral - Regular NT Oral - Multi-Consistency -- Oral - Pill -- Oral Phase - Comment --  CHL IP PHARYNGEAL PHASE 07/27/2020 Pharyngeal Phase Impaired Pharyngeal- Pudding Teaspoon -- Pharyngeal -- Pharyngeal- Pudding Cup -- Pharyngeal -- Pharyngeal- Honey Teaspoon -- Pharyngeal -- Pharyngeal- Honey Cup -- Pharyngeal -- Pharyngeal- Nectar Teaspoon -- Pharyngeal -- Pharyngeal- Nectar Cup -- Pharyngeal -- Pharyngeal- Nectar Straw -- Pharyngeal -- Pharyngeal- Thin Teaspoon -- Pharyngeal -- Pharyngeal- Thin Cup Pharyngeal residue - pyriform Pharyngeal Material does not enter airway Pharyngeal- Thin Straw Pharyngeal residue - pyriform Pharyngeal -- Pharyngeal- Puree Reduced pharyngeal peristalsis;Pharyngeal residue - pyriform;Compensatory strategies attempted (with notebox) Pharyngeal -- Pharyngeal- Mechanical Soft Pharyngeal residue - posterior pharnyx;Pharyngeal residue - pyriform;Compensatory strategies attempted (with notebox) Pharyngeal -- Pharyngeal- Regular NT Pharyngeal -- Pharyngeal- Multi-consistency -- Pharyngeal -- Pharyngeal- Pill WFL Pharyngeal -- Pharyngeal Comment --  CHL IP  CERVICAL ESOPHAGEAL PHASE 07/27/2020 Cervical Esophageal Phase Impaired Pudding Teaspoon -- Pudding Cup -- Honey Teaspoon -- Honey Cup -- Nectar Teaspoon -- Nectar Cup -- Nectar Straw -- Thin Teaspoon -- Thin Cup -- Thin Straw -- Puree Other (Comment) Mechanical Soft Other (Comment) Regular NT Multi-consistency -- Pill Other (Comment) Cervical Esophageal Comment see impression [place]arin E Smith 07/27/2020, 10:20 AM              No results for input(s): WBC, HGB, HCT, PLT in the last 72 hours. No results for input(s): NA, K, CL, CO2, GLUCOSE, BUN, CREATININE, CALCIUM in the last 72 hours.  Intake/Output Summary (Last 24 hours) at 07/29/2020 0733 Last data filed at 07/29/2020 0500 Gross per 24 hour  Intake 814 ml  Output 1600 ml  Net -786 ml        Physical Exam: Vital Signs Blood pressure 128/72, pulse 89, temperature 98.1 F (36.7 C), temperature source Oral, resp. rate 18, height 5\' 7"  (1.702 m), weight 81.4 kg, SpO2 96 %.   General: No acute distress Mood and affect are appropriate Heart: Regular rate and rhythm no rubs murmurs or extra sounds Lungs: Clear to auscultation, breathing unlabored, no rales or wheezes Abdomen: Positive bowel sounds, soft nontender to palpation, nondistended Extremities: No clubbing, cyanosis, or edema Skin: No evidence of breakdown, no evidence of rash   Musculoskeletal:     Cervical back: Normal range of motion and neck supple.     Comments: Left knee pain with flexion.  Left knee with tenderness and mild edema Skin:    General: Skin is warm and dry.     Comments: Tinea pedis bilaterally  with cracked right heel and thick toe nails.    Neurological: Ox3 Strong brisk hoffman's B/L- MAS of 1+ in UEs and trace to 1 in LEs.  Motor: Bilateral upper extremities: Shoulder abduction, elbow flexion/extension 4/5, wrist extension 3+/5, and intrinsics 2/5 Right lower extremity: Hip flexion, knee extension 2+/5, ankle dorsiflexion 4/5 no change Left lower extremity: Hip  flexion, knee extension 3 -/5, ankle dorsiflexion 4/5 no change Sensation to light touch diminished distal to elbows and knees    Assessment/Plan: 1. Functional deficits which require 3+ hours per day of interdisciplinary therapy in a comprehensive inpatient rehab setting.  Physiatrist is providing close team supervision and 24 hour management of active medical problems listed below.  Physiatrist and rehab team continue to assess barriers to discharge/monitor patient progress toward functional and medical goals  Care Tool:  Bathing    Body parts bathed by patient: Right arm,Left arm,Chest,Abdomen,Right upper leg,Left upper leg,Face,Front perineal area   Body parts bathed by helper: Buttocks,Right lower leg,Left lower leg     Bathing assist Assist Level: Moderate Assistance - Patient 50 - 74%     Upper Body Dressing/Undressing Upper body dressing   What is the patient wearing?: Pull over shirt    Upper body assist Assist Level: Contact Guard/Touching assist    Lower Body Dressing/Undressing Lower body dressing      What is the patient wearing?: Pants     Lower body assist Assist for lower body dressing: Maximal Assistance - Patient 25 - 49%     Toileting Toileting    Toileting assist Assist for toileting: Maximal Assistance - Patient 25 - 49%     Transfers Chair/bed transfer  Transfers assist     Chair/bed transfer assist level: Minimal Assistance - Patient > 75%     Locomotion Ambulation   Ambulation assist   Ambulation activity did not occur: Safety/medical concerns  Assist level: Minimal Assistance - Patient > 75% Assistive device: Walker-rolling Max distance: 65   Walk 10 feet activity   Assist  Walk 10 feet activity did not occur: Safety/medical concerns  Assist level: Minimal Assistance - Patient > 75% Assistive device: Walker-rolling   Walk 50 feet activity   Assist Walk 50 feet with 2 turns activity did not occur: Safety/medical  concerns    Assistive device: Walker-rolling    Walk 150 feet activity   Assist Walk 150 feet activity did not occur: Safety/medical concerns         Walk 10 feet on uneven surface  activity   Assist Walk 10 feet on uneven surfaces activity did not occur: Safety/medical concerns         Wheelchair     Assist Will patient use wheelchair at discharge?: Yes Type of Wheelchair: Manual    Wheelchair assist level: Minimal Assistance - Patient > 75% Max wheelchair distance: 76'    Wheelchair 50 feet with 2 turns activity    Assist        Assist Level: Minimal Assistance - Patient > 75%   Wheelchair 150 feet activity     Assist      Assist Level: Supervision/Verbal cueing   Blood pressure 128/72, pulse 89, temperature 98.1 F (36.7 C), temperature source Oral, resp. rate 18, height 5\' 7"  (1.702 m), weight 81.4 kg, SpO2 96 %.  Medical Problem List and Plan: 1.  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             -  patient may shower             -ELOS/Goals: 14 to 17 days/supervision/min a            Continue CIR PT, OT, SLP 2.  Antithrombotics:  -DVT/anticoagulation:  Pharmaceutical: Continue Lovenox  2/17- Very high risk of DVT due to SCI-will need 2-3 months of Lovenox per CHEST guidelines.    3/4- needs 2-3 Months of Lovenox base don distance walking.              -antiplatelet therapy: N/A 3. Pain Management: Continue Oxycodone prn.  2/21- pt reports pain is doing a little better- con't regimen  2/24- nerve pain still an issue, but had swelling with Lyrica, even at 75 mg TID-  Had to decrease to 25 mg TID, so now not that helpful. Will add Duloxetine 30 mg QHS for now and if tolerate,d increase dose.  3/3- off Lyrica- on Duloxetine 60 mg QHS- con't regimen             Monitor with increased exertion, particularly neuropathic pain 4. Mood: LCSW  to follow for evaluation and support.   3/3- will get pt Neuropsychology  3/4- d/w Neuropsych to see if they can see again.              -antipsychotic agents: N/A 5. Neuropsych: This patient is capable of making decisions on her own behalf. 6. Skin/Wound Care:  Monitor incision for healing.  Added protein supplement to help promote healing.  7. Fluids/Electrolytes/Nutrition: Monitor I/Os.              Monitor CMP 8. ABLA:              Monitor CBC 9. Hypokalemia/Hypomagnesemia: Has resolved with brief supplementation.              2/22- K+ 4.0 and last Mg 1.9- con't regimen  2/28: K+ reviewed, normalized to 4.2  3/4- labs again Monday 10. Macrocytosis/Polysubstance abuse:              Vitamin B12/folate levels ordered.   217- counseling given about polysubstance abuse 11. Neurogenic bowel and constipation: KUB ordered to determine stool burden.  2/17- moderate R sided stool burden with a lot of contrast left in colon- will order bowel program with dig stim nightly and con't Miralax BID for now- she has no control of stool per pt.   2/18- had 5+ BMs last night- feels cleaned out- now- kept saying had more in her- but now feels "great'.   2/21- having BMs with bowel program- will ask nursing to get done by 9pm, if possible  2/25- per pt, had BM on toilet last night- not clear if after bowel program- con't regimen and monitor  2/26- no suppository last night, since nursing decided with multiple BMs yesterday to only do dig stim- no results, but small BM on toilet this AM. COn't bowel program nightly  2/27- had bowel program last night- was successful per note- had results.   3/1- had 3 large BMs last night with bowel program  3/2- since going on toilet, will stop bowel program  3/3- LBM yesterday - having a lot of gas- d/w pt using fewer straws, but check with SLP first.  12. Neurogenic bladder  2/17- PVRs 0-100 cc so far- not on Flomax- con't regimen for now- has no control of bladder, of  note.   2/24- added Myrbetriq 25 mg daily- no improvement so far- still wet all the time-  basically zero PVRs- so trying timed voiding q2 hours.  2/25- no improvement so far with Myrbetriq and had (+) high PVRs yesterday -will stop Myrbetriq and do Flomax 0.4 mg qsupper and send to Urology outpt.   2/26- Flomax already helping bladder- less frequent urination and a little more control- con't Regimen  2/27- back to being "crazy" per pt- going very frequently- will con't and if no better Tuesday, will increase to 0.8 mg nightly   3/1- will check U/A and Cx- has been a few weeks- just to make sure- if negative, will d/w pt about foley since d/w Urology- they want [place] to start Vesicare AND Myrbetriq, get her into retention and cath, but she doesn't have good hand function, I'm concerned she won't be able to cath.   3/2- discussed foley vs being wet vs Vesicare/Myrbetriq- she wants to do foley- for now- until f/u with Urology.  3/3- foley placed- had a little leak last night- con't foley at this time.   3/4- (-) U/A basically- small leukocytes. con't foley  3/5 cont foley cath - ? Voiding trial next week 13. Neuropathic pain  2/17- start Lyrica 50 mg TID for nerve pain- hopefully will help "being on fire".   2/18- slightly better today- con't regimen  2/21- Increased Lyrica to 75 mg TID since no side effects of meds? But the arm swelling could be from Lyrica?  2/22- improved with decrease in lyrica dose.  2/24- Add Duloxetine 30 mg QHS for nerve pain  2/26- no side effects- con't Duloxetine- will increase Tuesday 3/1   3/2- increased yesterday- con't regimen- stopped Lyrica  3/5 no issues cont duloxetine 14. Spasticity  2/17- will start Baclofen 5 mg QID for muscle tightness/spasms.     2/24- spasms well controlled now- con't Baclofen  2/25- spasms controlled, but getting a little tighter- will con't baclofen and con't to monitor 3/5 controlled on current regimen 15. Dry skin: Eucerin ordered,  encouraged use.  16. B/L elbow/arm swelling  2/21- refused a medicine last night- could be cause of arm swelling. Will look into this. Held Lyrica last night- and swelling better- will d/w pt- asked her to refuse it today.    2/22- Lyrica decreased to 25 mg TID- swelling better  3/2- d/c Lyrica 17. Dysphagia  D2 thins diet  2/22- con't regimen until upgraded by SLP.  18. Urinary urgency/frequency  2/22- will try Myrbetriq 25 mg QHS for urinary frequency- will also check PVRs with addition of new meds, so not retaining, hopefully.   2/24- as per Neurogenic bladder  2/26- added Flomax yesterday- working [facility] better- con't Flomax   2/28: continue Flomax.   3/2- will d/c flomax and place foley since constant voiding.   3/3- is dry for first time since SCI- pt happy about this.  19. LE edema  2/25- will start TEDs 8am to 6pm daily for LE edema.  3/1- will stop Lyrica- could be adding to Swelling 20. Severe calluses on Feet  2/27- add order that will have feet warmed with warm wet washcloth, then lotion applied, then vaseline over that BID- to help cracks/calluses.      LOS: 18 days A FACE TO FACE EVALUATION WAS PERFORMED  Erick Colace 07/29/2020, 7:33 AM

## 2020-07-29 NOTE — Progress Notes (Signed)
Physical Therapy Session Note  Patient Details  Name: Yvette Phillips MRN: [id number] Date of Birth: [birth year]  Today's Date: 07/29/2020 PT Individual Time: 1300-1400 PT Individual Time Calculation (min): 60 min   Short Term Goals: Week 3:  PT Short Term Goal 1 (Week 3): Pt will perform bed mobility with supervision. PT Short Term Goal 2 (Week 3): Pt will  ambulate with LRAD and min A x 100 ft. PT Short Term Goal 3 (Week 3): Pt will propel wc with supervision x 100 ft.  Skilled Therapeutic Interventions/Progress Updates:    Pt seated in wc on arrival, agreeable to therapy. Donned shoes with tot A. Pt propelled wc x 120 ft with min A for navigation cues. Stand pivot wc<>mat with RW and min A. Pt performed STS from mat table with RW and min A progressing to CGA 3 x 5. Pt had episode of bowel incontinence, so returned to room and performed dependent brief change with stedy, dependent for pericare and clothing management. Stedy transfer to bed, pt required mod A for bed mobility. Pt left supine in bed with all needs in reach and bed alarm active, nursing notified about accident, which soiled foley.  Therapy Documentation Precautions:  Precautions Precautions: Fall,Cervical Precaution Comments: s/p ACDF C4-5 (no cervical brace needed per acute), BUE/LE weakness Restrictions Weight Bearing Restrictions: No    Therapy/Group: Individual Therapy  Dyane Dustman, SPT 07/29/2020, 5:01 PM

## 2020-07-30 LAB — BASIC METABOLIC PANEL
Anion gap: 10 (ref 5–15)
BUN: 15 mg/dL (ref 8–23)
CO2: 26 mmol/L (ref 22–32)
Calcium: 9.7 mg/dL (ref 8.9–10.3)
Chloride: 99 mmol/L (ref 98–111)
Creatinine, Ser: 0.76 mg/dL (ref 0.44–1.00)
GFR, Estimated: >60 mL/min (ref >60)
Glucose, Bld: 89 mg/dL (ref 70–99)
Potassium: 4 mmol/L (ref 3.5–5.1)
Sodium: 135 mmol/L (ref 135–145)

## 2020-07-30 LAB — CBC
HCT: 36.1 % (ref 36.0–46.0)
Hemoglobin: 12.6 g/dL (ref 12.0–15.0)
MCH: 36.1 pg — ABNORMAL HIGH (ref 26.0–34.0)
MCHC: 34.9 g/dL (ref 30.0–36.0)
MCV: 103.4 fL — ABNORMAL HIGH (ref 80.0–100.0)
Platelets: 312 10*3/uL (ref 150–400)
RBC: 3.49 MIL/uL — ABNORMAL LOW (ref 3.87–5.11)
RDW: 12.9 % (ref 11.5–15.5)
WBC: 8.3 10*3/uL (ref 4.0–10.5)
nRBC: 0 % (ref 0.0–0.2)

## 2020-07-30 MED ORDER — CHLORHEXIDINE GLUCONATE CLOTH 2 % EX PADS
6.0000 | MEDICATED_PAD | Freq: Two times a day (BID) | CUTANEOUS | Status: DC
  Administered 2020-07-30 – 2020-07-31 (×2): 6 via TOPICAL

## 2020-07-30 MED ORDER — BACLOFEN 5 MG HALF TABLET
15.0000 mg | ORAL_TABLET | Freq: Three times a day (TID) | ORAL | Status: DC
  Administered 2020-07-30 – 2020-08-09 (×31): 15 mg via ORAL
  Filled 2020-07-30 (×31): qty 1

## 2020-07-30 NOTE — Progress Notes (Signed)
PROGRESS NOTE   Subjective/Complaints:  Pt reports is having regular BMs without bowel program! However, muscle spasms are getting worse and more painful- jumping legs- a lot.  Asking to go up on muscle spasticity meds.    ROS:  Pt denies SOB, abd pain, CP, N/V/C/D, and vision changes   Objective:   No results found. Recent Labs    07/30/20 0636  WBC 8.3  HGB 12.6  HCT 36.1  PLT 312   Recent Labs    07/30/20 0636  NA 135  K 4.0  CL 99  CO2 26  GLUCOSE 89  BUN 15  CREATININE 0.76  CALCIUM 9.7    Intake/Output Summary (Last 24 hours) at 07/30/2020 1102 Last data filed at 07/30/2020 0730 Gross per 24 hour  Intake 400 ml  Output 800 ml  Net -400 ml        Physical Exam: Vital Signs Blood pressure 121/66, pulse 92, temperature 97.9 F (36.6 C), temperature source Oral, resp. rate 17, height 5\' 7"  (1.702 m), weight 81.4 kg, SpO2 95 %.    General: awake, alert, appropriate, NAD- laying supine in bed; OT at bedside HENT: conjugate gaze; oropharynx moist- hairs in twists CV: regular rate and rhythm; no JVD Pulmonary: CTA B/L; no W/R/R- good air movement GI: soft, NT, ND, (+)BS Psychiatric: appropriate Neurological: Ox3 Foley in place Musculoskeletal:     Cervical back: Normal range of motion and neck supple.     Comments: Left knee pain with flexion.  Left knee with tenderness and mild edema Skin:    General: Skin is warm and dry.     Comments: Tinea pedis bilaterally with cracked right heel and thick toe nails.    Neurological: Ox3 Strong Hoffman's B/L Also having legs jumping/muscle spasms- also went into extensor tone  Motor: Bilateral upper extremities: Shoulder abduction, elbow flexion/extension 4/5, wrist extension 3+/5, and intrinsics 2/5 Right lower extremity: Hip flexion, knee extension 2+/5, ankle dorsiflexion 4/5 no change Left lower extremity: Hip flexion, knee extension 3 -/5, ankle  dorsiflexion 4/5 no change Sensation to light touch diminished distal to elbows and knees    Assessment/Plan: 1. Functional deficits which require 3+ hours per day of interdisciplinary therapy in a comprehensive inpatient rehab setting.  Physiatrist is providing close team supervision and 24 hour management of active medical problems listed below.  Physiatrist and rehab team continue to assess barriers to discharge/monitor patient progress toward functional and medical goals  Care Tool:  Bathing    Body parts bathed by patient: Right arm,Left arm,Chest,Abdomen,Right upper leg,Left upper leg,Face,Front perineal area   Body parts bathed by helper: Buttocks,Right lower leg,Left lower leg     Bathing assist Assist Level: Moderate Assistance - Patient 50 - 74%     Upper Body Dressing/Undressing Upper body dressing   What is the patient wearing?: Pull over shirt    Upper body assist Assist Level: Contact Guard/Touching assist    Lower Body Dressing/Undressing Lower body dressing      What is the patient wearing?: Pants     Lower body assist Assist for lower body dressing: Maximal Assistance - Patient 25 - 49%     Toileting Toileting  Toileting assist Assist for toileting: Maximal Assistance - Patient 25 - 49%     Transfers Chair/bed transfer  Transfers assist     Chair/bed transfer assist level: Minimal Assistance - Patient > 75%     Locomotion Ambulation   Ambulation assist   Ambulation activity did not occur: Safety/medical concerns  Assist level: Contact Guard/Touching assist Assistive device: Walker-rolling Max distance: 100   Walk 10 feet activity   Assist  Walk 10 feet activity did not occur: Safety/medical concerns  Assist level: Contact Guard/Touching assist Assistive device: Walker-rolling   Walk 50 feet activity   Assist Walk 50 feet with 2 turns activity did not occur: Safety/medical concerns  Assist level: Contact Guard/Touching  assist Assistive device: Walker-rolling    Walk 150 feet activity   Assist Walk 150 feet activity did not occur: Safety/medical concerns    Assistive device: Walker-rolling    Walk 10 feet on uneven surface  activity   Assist Walk 10 feet on uneven surfaces activity did not occur: Safety/medical concerns         Wheelchair     Assist Will patient use wheelchair at discharge?: Yes Type of Wheelchair: Manual    Wheelchair assist level: Supervision/Verbal cueing Max wheelchair distance: 120    Wheelchair 50 feet with 2 turns activity    Assist        Assist Level: Minimal Assistance - Patient > 75%   Wheelchair 150 feet activity     Assist      Assist Level: Moderate Assistance - Patient 50 - 74%   Blood pressure 121/66, pulse 92, temperature 97.9 F (36.6 C), temperature source Oral, resp. rate 17, height 5\' 7"  (1.702 m), weight 81.4 kg, SpO2 95 %.  Medical Problem List and Plan: 1.  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             -patient may shower             -ELOS/Goals: 14 to 17 days/supervision/min a            Continue CIR PT, OT, SLP 2.  Antithrombotics:  -DVT/anticoagulation:  Pharmaceutical: Continue Lovenox  2/17- Very high risk of DVT due to SCI-will need 2-3 months of Lovenox per CHEST guidelines.    3/4- needs 2-3 Months of Lovenox based on distance walking.              -antiplatelet therapy: N/A 3. Pain Management: Continue Oxycodone prn.  2/21- pt reports pain is doing a little better- con't regimen  2/24- nerve pain still an issue, but had swelling with Lyrica, even at 75 mg TID-  Had to decrease to 25 mg TID, so now not that helpful. Will add Duloxetine 30 mg QHS for now and if tolerate,d increase dose.  3/3- off Lyrica- on Duloxetine 60 mg QHS- con't regimen             Monitor with increased exertion, particularly  neuropathic pain 4. Mood: LCSW to follow for evaluation and support.   3/3- will get pt Neuropsychology  3/4- d/w Neuropsych to see if they can see again.              -antipsychotic agents: N/A 5. Neuropsych: This patient is capable of making decisions on her own behalf. 6. Skin/Wound Care:  Monitor incision for healing.  Added protein supplement to help promote healing.  7. Fluids/Electrolytes/Nutrition: Monitor I/Os.  Monitor CMP 8. ABLA:              Monitor CBC 9. Hypokalemia/Hypomagnesemia: Has resolved with brief supplementation.              2/22- K+ 4.0 and last Mg 1.9- con't regimen  2/28: K+ reviewed, normalized to 4.2  3/4- labs again Monday  3/7- K+ 4.0- doing great 10. Macrocytosis/Polysubstance abuse:              Vitamin B12/folate levels ordered.   217- counseling given about polysubstance abuse 11. Neurogenic bowel and constipation: KUB ordered to determine stool burden.  2/17- moderate R sided stool burden with a lot of contrast left in colon- will order bowel program with dig stim nightly and con't Miralax BID for now- she has no control of stool per pt.   2/18- had 5+ BMs last night- feels cleaned out- now- kept saying had more in her- but now feels "great'.   2/26- no suppository last night, since nursing decided with multiple BMs yesterday to only do dig stim- no results, but small BM on toilet this AM. COn't bowel program nightly  3/2- since going on toilet, will stop bowel program  3/3- LBM yesterday - having a lot of gas- d/w pt using fewer straws, but check with SLP first.   3/7- Pt having regular BMs with NO bowel program- going on toilet.  12. Neurogenic bladder  2/17- PVRs 0-100 cc so far- not on Flomax- con't regimen for now- has no control of bladder, of note.   2/24- added Myrbetriq 25 mg daily- no improvement so far- still wet all the time- basically zero PVRs- so trying timed voiding q2 hours.  2/25- no improvement so far with Myrbetriq and  had (+) high PVRs yesterday -will stop Myrbetriq and do Flomax 0.4 mg qsupper and send to Urology outpt.   2/26- Flomax already helping bladder- less frequent urination and a little more control- con't Regimen  2/27- back to being "crazy" per pt- going very frequently- will con't and if no better Tuesday, will increase to 0.8 mg nightly   3/1- will check U/A and Cx- has been a few weeks- just to make sure- if negative, will d/w pt about foley since d/w Urology- they want [place] to start Vesicare AND Myrbetriq, get her into retention and cath, but she doesn't have good hand function, I'm concerned she won't be able to cath.   3/2- discussed foley vs being wet vs Vesicare/Myrbetriq- she wants to do foley- for now- until f/u with Urology.  3/3- foley placed- had a little leak last night- con't foley at this time.   3/4- (-) U/A basically- small leukocytes. con't foley  3/5 cont foley cath - ? Voiding trial next week  3/7- will d/w pt about going home wet vs cathing (don't think she has hand fxn to cath) vs chronic foley? 13. Neuropathic pain  2/17- start Lyrica 50 mg TID for nerve pain- hopefully will help "being on fire".   2/18- slightly better today- con't regimen  2/21- Increased Lyrica to 75 mg TID since no side effects of meds? But the arm swelling could be from Lyrica?  3/2- increased yesterday- con't regimen- stopped Lyrica  3/7- Pain better controlled- con't regimen 14. Spasticity  2/17- will start Baclofen 5 mg QID for muscle tightness/spasms.     2/24- spasms well controlled now- con't Baclofen  2/25- spasms controlled, but getting a little tighter- will con't baclofen and con't to monitor  3/7- will increase Baclofen to 15 mg TID for spasticity- watch for constipation and sedation.  15. Dry skin: Eucerin ordered, encouraged use.  16. B/L elbow/arm swelling  2/21- refused a medicine last night- could be cause of arm swelling. Will look into this. Held Lyrica last night- and swelling better-  will d/w pt- asked her to refuse it today.    2/22- Lyrica decreased to 25 mg TID- swelling better  3/2- d/c Lyrica  3/7- swelling a lot better 17. Dysphagia  D2 thins diet  2/22- con't regimen until upgraded by SLP.  18. Urinary urgency/frequency  2/22- will try Myrbetriq 25 mg QHS for urinary frequency- will also check PVRs with addition of new meds, so not retaining, hopefully.   2/24- as per Neurogenic bladder  2/26- added Flomax yesterday- working [facility] better- con't Flomax   2/28: continue Flomax.   3/2- will d/c flomax and place foley since constant voiding.   3/3- is dry for first time since SCI- pt happy about this.  19. LE edema  2/25- will start TEDs 8am to 6pm daily for LE edema.  3/1- will stop Lyrica- could be adding to Swelling  3/7- swelling better off Lyrica.  20. Severe calluses on Feet  2/27- add order that will have feet warmed with warm wet washcloth, then lotion applied, then vaseline over that BID- to help cracks/calluses.      LOS: 19 days A FACE TO FACE EVALUATION WAS PERFORMED  Megan Lovorn 07/30/2020, 11:02 AM

## 2020-07-30 NOTE — Progress Notes (Signed)
Physical Therapy Session Note  Patient Details  Name: Yvette Phillips MRN: [id number] Date of Birth: 11/13/1960  Today's Date: 07/30/2020 PT Individual Time: 0900-1000 PT Individual Time Calculation (min): 60 min   Short Term Goals: Week 3:  PT Short Term Goal 1 (Week 3): Pt will perform bed mobility with supervision. PT Short Term Goal 2 (Week 3): Pt will  ambulate with LRAD and min A x 100 ft. PT Short Term Goal 3 (Week 3): Pt will propel wc with supervision x 100 ft.  Skilled Therapeutic Interventions/Progress Updates:    Pt supine in bed with un-fastened brief, agreeable to therapy. Pt has pain in R knee, un-rated. Pt requests to use bathroom before leaving room. Mod A bed mobility for time conservation, STS with stedy min A to CGA at times. Stedy transfer to commode with continent void, dependent for pericare and clothing management. Pt able to tolerate standing in stedy for several minutes to assist with pericare. Pt ambulated with RW and CGA x 80 ft and x 100 ft. Pt returned to room and remained seated in recliner with legs elevated, chair alarm active, and all needs in reach.   Therapy Documentation Precautions:  Precautions Precautions: Fall,Cervical Precaution Comments: s/p ACDF C4-5 (no cervical brace needed per acute), BUE/LE weakness Restrictions Weight Bearing Restrictions: No    Therapy/Group: Individual Therapy  Dyane Dustman, SPT 07/30/2020, 10:40 AM

## 2020-07-30 NOTE — Progress Notes (Signed)
Speech Language Pathology Daily Session Note  Patient Details  Name: Yvette Phillips MRN: [id number] Date of Birth: 02-Feb-1961  Today's Date: 07/30/2020 SLP Individual Time: 6568-1275 SLP Individual Time Calculation (min): 28 min  Short Term Goals: Week 3: SLP Short Term Goal 1 (Week 3): Pt will consume dys 2 textures and thin liquids with supervision A verbal cues to utilize swallow strategies. SLP Short Term Goal 2 (Week 3): Pt will consume trials of dys 3 textures with minimal overt s/s aspiration noted x3 prior to diet advancement.  Skilled Therapeutic Interventions: Pt was seen for skilled ST targeting dysphagia goals. SLP facilitated session with skilled observation of pt consuming recently upgraded Dys 3 (mechanical soft) texture and thin liquid breakfast items. Pt demonstrated use of compensatory safe swallow strategies of extra dry swallows and alternating solids with liquids Mod I. She was also independent with use of slow rate and small bites/sips. No overt s/sx aspiration observed throughout intake. Pt denied any difficulty with new meal texture since upgrade last Friday. Given that this is believed to be the safest least restrictive diet for pt and pt has met ST goals, recommend that pt discharge from [place] today. Pt also reports that these textures are most in line with her baseline diet anyway. Skilled education provided regarding recommendations to continue to avoid dry, crumby, tough, and overly sticky textures - pt able to identify examples of foods to avoid independently (ex: steak, bacon, peanut butter). Pt left sitting upright in bed with alarm set and needs within reach.      Pain Pain Assessment Pain Score: Asleep  Therapy/Group: Individual Therapy  Arbutus Leas 07/30/2020, 7:20 AM

## 2020-07-30 NOTE — Progress Notes (Signed)
Occupational Therapy Session Note  Patient Details  Name: Yvette Phillips MRN: [id number] Date of Birth: September 15, 1960  Today's Date: 07/30/2020 OT Individual Time: 1100-1155 OT Individual Time Calculation (min): 55 min    Short Term Goals: Week 3:  OT Short Term Goal 1 (Week 3): Pt will perform LB dressing with Mod A OT Short Term Goal 2 (Week 3): Pt will increase functional grip strength to perform oral hygiene w/ Supervision OT Short Term Goal 3 (Week 3): Pt will complete BSC/toilet transfers with CGA using LRAD OT Short Term Goal 4 (Week 3): Pt will stand with one hand support for 2+ minutes in order to complete LB ADL  Skilled Therapeutic Interventions/Progress Updates:    Pt resting in recliner upon arrival.  OT intervention with focus on sit<>stand, functional amb with RW, BUE gross motor/FMC activities and strengthening, and activity tolerance to increase independence with BADLs. Sit<>stand and functional amb with RW in room at [facility]. Pt engaged in table tasks with focus on [facility] tasks-theraputty with small beads, grasp strengthening with red and green clothes pins, and PVC pipe tree assembly. Pt attemped 9 hole peg test but unable to pick up small pegs. Pt with improved grasp strength and FMC tasks. Pt returned to room and amb with RW to recliner. Pt remained in recliner with all needs within reach and seat alarm activated.  Therapy Documentation Precautions:  Precautions Precautions: Fall,Cervical Precaution Comments: s/p ACDF C4-5 (no cervical brace needed per acute), BUE/LE weakness Restrictions Weight Bearing Restrictions: No Pain: Pt denies pain  Therapy/Group: Individual Therapy  [name] 07/30/2020, 12:11 PM

## 2020-07-30 NOTE — Progress Notes (Signed)
Speech Language Pathology Discharge Summary  Patient Details  Name: Yvette Phillips MRN: [id number] Date of Birth: 1961-04-03  Patient has met 4 of 4 long term goals.  Patient to discharge at overall Modified Independent level.   Reasons goals not met: n/a   Clinical Impression/Discharge Summary:   Pt made functional gains and met 4 out of 4 long term goals this admission. Pt is consuming a dysphagia 3 (mechanical soft) texture diet with thin liquids with very minimal overt s/sx aspiration and Mod I for use of alternating liquid and solids, and extra dry swallow strategies for safe swallowing due to Min pharyngeal reside noted with solids on MBSS. Pt has demonstrated improved oral and pharyngeal swallow function, and although she did not appropriately use chin tuck strategy previously recommended after first MBSS, she does use alternating liquids and solids and triggers spontaneous extra dry swallow Mod I, and these strategies alone were determine to be efficient to clear pharyngeal residue on repeat MBSS conducted last Friday. Pt also reports that dysphagia 3 textures are very close to her baseline diet. Given that pt is believed to be on the safest and least restrictive diet and has met all ST treatment goals, recommend pt discharge from skilled ST services today. No follow up ST is indicated, and education has been completed with the pt.    Care Partner:  Caregiver Able to Provide Assistance: Other (comment) (d/c plans TBD)  Type of Caregiver Assistance:  (TBD)  Recommendation:  None  Rationale for SLP Follow Up: Other (comment) (n/a)   Equipment: none   Reasons for discharge: Treatment goals met   Patient/Family Agrees with Progress Made and Goals Achieved: Yes    Arbutus Leas 07/30/2020, 7:58 AM

## 2020-07-30 NOTE — Progress Notes (Signed)
Physical Therapy Session Note  Patient Details  Name: Yvette Phillips MRN: [id number] Date of Birth: 10/21/1960  Today's Date: 07/30/2020 PT Individual Time: 1435-1500 PT Individual Time Calculation (min): 25 min   Short Term Goals: Week 1:  PT Short Term Goal 1 (Week 1): pt to demonstrate supine<>sit mod A PT Short Term Goal 1 - Progress (Week 1): Met PT Short Term Goal 2 (Week 1): pt to demonstrate functional transfer with LRAD at mod A PT Short Term Goal 2 - Progress (Week 1): Met PT Short Term Goal 3 (Week 1): pt to demonstrate gait training with LRAD for 20' PT Short Term Goal 3 - Progress (Week 1): Progressing toward goal PT Short Term Goal 4 (Week 1): pt to demonstrate standing balance at mod A for at least 3 mins PT Short Term Goal 4 - Progress (Week 1): Met Week 2:  PT Short Term Goal 1 (Week 2): Pt will complete bed mobility with min A consistently PT Short Term Goal 1 - Progress (Week 2): Met PT Short Term Goal 2 (Week 2): Pt will perform transfers with LRAD and min A consistently PT Short Term Goal 2 - Progress (Week 2): Met PT Short Term Goal 3 (Week 2): Pt will ambulate x 50 ft with LRAD and mod A PT Short Term Goal 3 - Progress (Week 2): Met Week 3:  PT Short Term Goal 1 (Week 3): Pt will perform bed mobility with supervision. PT Short Term Goal 2 (Week 3): Pt will  ambulate with LRAD and min A x 100 ft. PT Short Term Goal 3 (Week 3): Pt will propel wc with supervision x 100 ft.  Skilled Therapeutic Interventions/Progress Updates:    Pain:  Pt reports burning pain in feet and sharp pain into RLE.  Did not quantify.  Treatment to tolerance.  Rest breaks and repositioning as needed. Session ended 5 min early due to pt fatigue due to pain (per pt.)  Pt initially oob in recliner and agreeable to treatment session w/focus on functional mobility. Sit to stand from recliner to RW  w/min to mod assist, additional time to achieve upright. Pt able to perform wt shifting in standing  w/RW w/cga x 1 min.  Returned to sitting due to discomfort/see above.  Pt Sit to stand to rw w/mod assist and gait 22f x 2 w/RW, cga, cues for upright posture, verbal and tactile cues to facilitate increased quad activation w/loading.  Pt rested in sitting several min between gait efforts.  Short distance gait to head of bed w/cga, cues.  Stand to sit w/cga.  Max assist to remove shoes, maintains sitting blaance w/supervision. Sit to supine w/max cues for spinal precautions, mod assist for LE management. Pt left supine w/rails up x 4, alarm set, bed in lowest position, and needs in reach.   Therapy Documentation Precautions:  Precautions Precautions: Fall,Cervical Precaution Comments: s/p ACDF C4-5 (no cervical brace needed per acute), BUE/LE weakness Restrictions Weight Bearing Restrictions: No    Therapy/Group: Individual Therapy  BCallie Fielding PVictoria3/11/2020, 3:46 PM

## 2020-07-31 MED ORDER — DULOXETINE HCL 30 MG PO CPEP
90.0000 mg | ORAL_CAPSULE | Freq: Every day | ORAL | Status: DC
  Administered 2020-07-31 – 2020-08-20 (×21): 90 mg via ORAL
  Filled 2020-07-31 (×21): qty 3

## 2020-07-31 NOTE — Progress Notes (Signed)
Occupational Therapy Session Note  Patient Details  Name: Yvette Phillips MRN: [id number] Date of Birth: October 31, 1960  Today's Date: 07/31/2020 OT Individual Time: 1130-1200 OT Individual Time Calculation (min): 30 min    Short Term Goals: Week 4:  OT Short Term Goal 1 (Week 4): STG=LTG 2/2 ELOS  Skilled Therapeutic Interventions/Progress Updates:    OT intervention with focus on BUE [facility] with peg board and paper. Pt challenged with picking up medium size pegs and placing on peg board (using BUE). Task required minimal in hand manipulation skills. Pt completed task with more then a reasonable amount of time. Pt also challenged with tearing sheets of paper and crumpling up before gathering all in one hand. Pt completed task with more then a reasonable amount of time. Pt remained in w/c with belt alarm activated and all needs within reach.   Therapy Documentation Precautions:  Precautions Precautions: Fall,Cervical Precaution Comments: s/p ACDF C4-5 (no cervical brace needed per acute), BUE/LE weakness Restrictions Weight Bearing Restrictions: No   Pain: Pt denies pain this morning  Therapy/Group: Individual Therapy  [name] 07/31/2020, 12:26 PM

## 2020-07-31 NOTE — Progress Notes (Signed)
Patient ID: Yvette Phillips, female   DOB: Oct 04, 1960, 60 y.o.   MRN: [id number]  SW returned phone call to pt APS caseworker [place] [name] [phone]) since now have a patient signed consent of release. Questions were in relation to pt discharge date and plan. SW informed pt d/c date is now 3/24 and pt to d/c to her sister Yvette Phillips's home. Reports concerns about possible plan, and SW plans. SW reminded [name] that pt does not have insurance, so options will be very limited despite pt having applied for Medicaid/SSDI it will not have been approved by her discharge.   SW met with pt in room to provide updates on change in d/c date to 3/24. Pt in agreement. Pt melancholic, as she realizes there is not much she can do about her situation. Pt aware SW to follow-up with her sister Yvette Phillips to provide updates.   [name], MSW, [place] Office: [phone] Cell: [phone] Fax: [phone]

## 2020-07-31 NOTE — Progress Notes (Signed)
Physical Therapy Session Note  Patient Details  Name: Yvette Phillips MRN: [id number] Date of Birth: April 24, 1961  Today's Date: 07/31/2020 PT Individual Time: 1350-1450 PT Individual Time Calculation (min): 60 min   Short Term Goals: Week 3:  PT Short Term Goal 1 (Week 3): Pt will perform bed mobility with supervision. PT Short Term Goal 2 (Week 3): Pt will  ambulate with LRAD and min A x 100 ft. PT Short Term Goal 3 (Week 3): Pt will propel wc with supervision x 100 ft.  Skilled Therapeutic Interventions/Progress Updates:    Pt seated in wc on arrival, agreeable to therapy. Pt requests to use the bathroom before beginning session. Pt able to ambulate in-room distances and stand with RW throughout toileting and hand hygiene with CGA. Pt completed 3/3 toileting tasks with CGA and assist with pericare for thoroughness. Gait with RW and CGA x 120 ft, x 100 ft, x 100 ft. Pt demonstrates poor foot clearance, increased knee flexion in stance, and decreased stride length which worsens with fatigue. Pt performed step taps x 20 on 3" step with min A for balance and BIL handrails and step ups x 5 BIL. Pt required knee blocking to prevent buckling, R>L and had difficulty with eccentric control. Pt returned to bed after session with mod A due to fatigue.   Therapy Documentation Precautions:  Precautions Precautions: Fall,Cervical Precaution Comments: s/p ACDF C4-5 (no cervical brace needed per acute), BUE/LE weakness Restrictions Weight Bearing Restrictions: No    Therapy/Group: Individual Therapy  Dyane Dustman, SPT 07/31/2020, 3:36 PM

## 2020-07-31 NOTE — Progress Notes (Signed)
Physical Therapy Session Note  Patient Details  Name: Yvette Phillips MRN: [id number] Date of Birth: 10/14/1960  Today's Date: 07/31/2020 PT Individual Time: 1100-1130 PT Individual Time Calculation (min): 30 min   Short Term Goals: Week 1:  PT Short Term Goal 1 (Week 1): pt to demonstrate supine<>sit mod A PT Short Term Goal 1 - Progress (Week 1): Met PT Short Term Goal 2 (Week 1): pt to demonstrate functional transfer with LRAD at mod A PT Short Term Goal 2 - Progress (Week 1): Met PT Short Term Goal 3 (Week 1): pt to demonstrate gait training with LRAD for 20' PT Short Term Goal 3 - Progress (Week 1): Progressing toward goal PT Short Term Goal 4 (Week 1): pt to demonstrate standing balance at mod A for at least 3 mins PT Short Term Goal 4 - Progress (Week 1): Met Week 2:  PT Short Term Goal 1 (Week 2): Pt will complete bed mobility with min A consistently PT Short Term Goal 1 - Progress (Week 2): Met PT Short Term Goal 2 (Week 2): Pt will perform transfers with LRAD and min A consistently PT Short Term Goal 2 - Progress (Week 2): Met PT Short Term Goal 3 (Week 2): Pt will ambulate x 50 ft with LRAD and mod A PT Short Term Goal 3 - Progress (Week 2): Met Week 3:  PT Short Term Goal 1 (Week 3): Pt will perform bed mobility with supervision. PT Short Term Goal 2 (Week 3): Pt will  ambulate with LRAD and min A x 100 ft. PT Short Term Goal 3 (Week 3): Pt will propel wc with supervision x 100 ft.  Skilled Therapeutic Interventions/Progress Updates:    pt received in recliner and agreeable to therapy. Pt directed in Stand pivot transfer with Rolling walker to mod A. Pt then directed in gait training with Rolling walker min A with intermittent CGA and WC follow for safety 10' + 75' with VC for increased step height on LLE and increased core activation for improved pelvic control. Pt benefited from this greatly and instructed in standing core activation/relax then activation with breathing with  fair effect however greatly improved pelvic stability with walking. Pt then returned to room in [facility] supervision with good technique noted 150'. pt also benefited from music with therapy to improve pt's mood. Pt left in WC, All needs in reach and in good condition. Call light in hand.  And alarm set.   Therapy Documentation Precautions:  Precautions Precautions: Fall,Cervical Precaution Comments: s/p ACDF C4-5 (no cervical brace needed per acute), BUE/LE weakness Restrictions Weight Bearing Restrictions: No General:   Vital Signs:   Pain: Pain Assessment Pain Scale: 0-10 Pain Score: 0-No pain Pain Location: Shoulder Pain Orientation: Right;Left Pain Descriptors / Indicators: Sharp Pain Intervention(s): Medication (See eMAR) Mobility:   Locomotion :    Trunk/Postural Assessment :    Balance:   Exercises:   Other Treatments:      Therapy/Group: Individual Therapy  Junie Panning 07/31/2020, 12:01 PM

## 2020-07-31 NOTE — Patient Care Conference (Signed)
Inpatient RehabilitationTeam Conference and Plan of Care Update Date: 07/31/2020   Time: 11:05 AM    Patient Name: Yvette Phillips      Medical Record Number: [id number]  Date of Birth: Dec 19, 1960 Sex: Female         Room/Bed: 4M10C/4M10C-01 Payor Info: Payor: MEDICAID POTENTIAL / Plan: MEDICAID POTENTIAL / Product Type: *No Product type* /    Admit Date/Time:  07/11/2020  1:08 PM  Primary Diagnosis:  Central cord syndrome at C4 level of cervical spinal cord, subsequent encounter [facility])  Hospital Problems: Principal Problem:   Central cord syndrome at C4 level of cervical spinal cord, subsequent encounter [facility]) Active Problems:   Alcohol abuse   Dysphagia, pharyngoesophageal phase   S/P cervical spinal fusion   Acute blood loss anemia   Macrocytosis   Neurogenic bladder   Neurogenic bowel    Expected Discharge Date: Expected Discharge Date: 08/16/20  Team Members Present: Physician leading conference: Dr. Genice Rouge Care Coodinator Present: Cecile Sheerer, LCSWA;Haniah Penny Marlyne Beards, RN, BSN, CRRN Nurse Present: Luevenia Maxin, LPN PT Present: Peter Congo, PT OT Present: Ardis Rowan, COTA;Jennifer Katrinka Blazing, OT SLP Present: Colin Benton, SLP PPS Coordinator present : Fae Pippin, SLP     Current Status/Progress Goal Weekly Team Focus  Bowel/Bladder   Pt is continent of bowel and incontinent of bladder w/ foley.  Pt will regain full continence.  Assess q shift and prn.   Swallow/Nutrition/ Hydration             ADL's   UB bathe/dress-min A; LB dressing-max A; sit<>stand and functional transfers with CGA; toileting-mod A  min A/CGA overall  BADL retraining, standing balance, functional transfers, BUE NMR, activity tolerance, safety awareness   Mobility   supervision-min A bed mobility, min A stand pivot transfers, min A gait with RW x 100 ft, supervision to min A wc mobility up to 150 ft  min A overall, short distance gait  LE NMR, gait, transfers   Communication              Safety/Cognition/ Behavioral Observations            Pain   Pt reports pain is controlled by medication.  Pain will remai <3.  Assess q shift and prn.   Skin   Pt has reddened skin in groin and bilateral heel cracking and large callouses bilaterally on feet.  Skin will continue to heal and remain infection free.  Assess q shift and prn.     Discharge Planning:  Pt is uninsured. Pt now unabe to return to previous residence. Pt able to d/c to her sister's home if needed.   Team Discussion: Removed foley per patient request, patient to be on a timed toileting schedule. Discontinue bowel program, discontinued Lyrica due to edema. Has stye to right eye, appears ready to drain.  Educated patient on foley removal, timed toileting schedule. Held miralax today. Complains of mild pain. Redness to groin and buttocks, warm compress applied to right eye. Patient on target to meet rehab goals: Min assist with RW 100 ft, WC mobility progressing-hand strength is a barrier. Had an incontinent bowel episode with PT. Showered with OT due to an incontinent bowel episode. Reports she can sometimes feel them but often times it's too late. Lower body dressing should improve with the foley out. Working on self feeding and oral care. SLP discharged her yesterday.  *See Care Plan and progress notes for long and short-term goals.   Revisions to Treatment Plan:  Discontinued  foley, discharged from SLP.  Teaching Needs: Family education, medication management, skin/wound care, pain management, bowel/bladder management, transfer training, gait training, safety awareness, endurance training, balance training.  Current Barriers to Discharge: Inaccessible home environment, Decreased caregiver support, Home enviroment access/layout, Incontinence, Neurogenic bowel and bladder, Wound care, Lack of/limited family support, Insurance for SNF coverage, Weight, Weight bearing restrictions, Medication compliance and  Behavior  Possible Resolutions to Barriers: Continue current medications, provide emotional support.     Medical Summary Current Status: removed foley this AM- has incontinence of bladder prior- will try timed voiding; holding miralax due to loose incontinent stools; increased N/T in last week and increased spasms. R eye stye  Barriers to Discharge: Decreased family/caregiver support;Home enviroment access/layout;Incontinence;Neurogenic Bowel & Bladder;Insurance for SNF coverage;Weight bearing restrictions;Medical stability  Barriers to Discharge Comments: no insurance; warm compresses R eye- for stye and neurogenic bowel/bladder- incontinence; Possible Resolutions to [employer] Focus: timed voiding starting for bladder; reduced bowel meds; increased Baclofen 15 mg TID; increased duloxetine to 90 mg QHS for nerve pain- focus on nerve pain/spasticity. walked 100 ft RW min A d/c'd from SLP - D3 diet thin; d/c date 3/24- extended due to family situation   Continued Need for Acute Rehabilitation Level of Care: The patient requires daily medical management by a physician with specialized training in physical medicine and rehabilitation for the following reasons: Direction of a multidisciplinary physical rehabilitation program to maximize functional independence : Yes Medical management of patient stability for increased activity during participation in an intensive rehabilitation regime.: Yes Analysis of laboratory values and/or radiology reports with any subsequent need for medication adjustment and/or medical intervention. : Yes   I attest that I was present, lead the team conference, and concur with the assessment and plan of the team.   Tennis Must 07/31/2020, 4:02 PM

## 2020-07-31 NOTE — Progress Notes (Signed)
PROGRESS NOTE   Subjective/Complaints:  Has R eye stye- that feels like it's coming to "head".  Wants to try again without foley.  C/o N/T in feet as well as muscle tightness- "hard to move feet" due to muscle tightness.  Walking getting better- >100 ft RW yesterday per pt.  Was asking if "would walk again". I explained maybe not without RW< but yes, considering how far she's going now!  Wants to try timed voiding again-   ROS:  Pt denies SOB, abd pain, CP, N/V/C/D, and vision changes  Objective:   No results found. Recent Labs    07/30/20 0636  WBC 8.3  HGB 12.6  HCT 36.1  PLT 312   Recent Labs    07/30/20 0636  NA 135  K 4.0  CL 99  CO2 26  GLUCOSE 89  BUN 15  CREATININE 0.76  CALCIUM 9.7    Intake/Output Summary (Last 24 hours) at 07/31/2020 0911 Last data filed at 07/31/2020 0530 Gross per 24 hour  Intake 413 ml  Output 1750 ml  Net -1337 ml        Physical Exam: Vital Signs Blood pressure 124/77, pulse 82, temperature 98 F (36.7 C), resp. rate 18, height 5\' 7"  (1.702 m), weight 81.4 kg, SpO2 97 %.     General: awake, alert, appropriate, NAD- sitting up in bed- eating breakfast HENT: conjugate gaze; oropharynx moist- has Stye on R lower lid- very swollen- slightly red- like going to drain soon CV: regular rate and rhythm; no JVD Pulmonary: CTA B/L; no W/R/R- good air movement GI: soft, NT, ND, (+)BS Psychiatric: appropriate- asking about bladder issues Neurological: Ox3 Foley in place Musculoskeletal:     Cervical back: Normal range of motion and neck supple.     Comments: Left knee pain with flexion.  Left knee with tenderness and mild edema Skin:    General: Skin is warm and dry.     Comments: Tinea pedis bilaterally with cracked right heel and thick toe nails.    Swelling much improved in LEs- without Lyrica is trace in feet/ankles Neurological: Ox3 Strong Hoffman's B/L Also having  legs jumping/muscle spasms- didn't see today, however has MAS of 1+ in ankles- no clonus seen Motor: Bilateral upper extremities: Shoulder abduction, elbow flexion/extension 4/5, wrist extension 3+/5, and intrinsics 2/5 Right lower extremity: Hip flexion, knee extension 2+/5, ankle dorsiflexion 4/5 no change Left lower extremity: Hip flexion, knee extension 3 -/5, ankle dorsiflexion 4/5 no change Sensation to light touch diminished distal to elbows and knees    Assessment/Plan: 1. Functional deficits which require 3+ hours per day of interdisciplinary therapy in a comprehensive inpatient rehab setting.  Physiatrist is providing close team supervision and 24 hour management of active medical problems listed below.  Physiatrist and rehab team continue to assess barriers to discharge/monitor patient progress toward functional and medical goals  Care Tool:  Bathing    Body parts bathed by patient: Right arm,Left arm,Chest,Abdomen,Right upper leg,Left upper leg,Face,Front perineal area   Body parts bathed by helper: Buttocks,Right lower leg,Left lower leg     Bathing assist Assist Level: Moderate Assistance - Patient 50 - 74%  Upper Body Dressing/Undressing Upper body dressing   What is the patient wearing?: Pull over shirt    Upper body assist Assist Level: Contact Guard/Touching assist    Lower Body Dressing/Undressing Lower body dressing      What is the patient wearing?: Pants     Lower body assist Assist for lower body dressing: Maximal Assistance - Patient 25 - 49%     Toileting Toileting    Toileting assist Assist for toileting: Maximal Assistance - Patient 25 - 49%     Transfers Chair/bed transfer  Transfers assist     Chair/bed transfer assist level: Minimal Assistance - Patient > 75%     Locomotion Ambulation   Ambulation assist   Ambulation activity did not occur: Safety/medical concerns  Assist level: Contact Guard/Touching assist Assistive  device: Walker-rolling Max distance: 100   Walk 10 feet activity   Assist  Walk 10 feet activity did not occur: Safety/medical concerns  Assist level: Contact Guard/Touching assist Assistive device: Walker-rolling   Walk 50 feet activity   Assist Walk 50 feet with 2 turns activity did not occur: Safety/medical concerns  Assist level: Contact Guard/Touching assist Assistive device: Walker-rolling    Walk 150 feet activity   Assist Walk 150 feet activity did not occur: Safety/medical concerns    Assistive device: Walker-rolling    Walk 10 feet on uneven surface  activity   Assist Walk 10 feet on uneven surfaces activity did not occur: Safety/medical concerns         Wheelchair     Assist Will patient use wheelchair at discharge?: Yes Type of Wheelchair: Manual    Wheelchair assist level: Supervision/Verbal cueing Max wheelchair distance: 120    Wheelchair 50 feet with 2 turns activity    Assist        Assist Level: Minimal Assistance - Patient > 75%   Wheelchair 150 feet activity     Assist      Assist Level: Moderate Assistance - Patient 50 - 74%   Blood pressure 124/77, pulse 82, temperature 98 F (36.7 C), resp. rate 18, height 5\' 7"  (1.702 m), weight 81.4 kg, SpO2 97 %.  Medical Problem List and Plan: 1.  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             -patient may shower             -ELOS/Goals: 14 to 17 days/supervision/min a            Continue CIR PT, OT, SLP 2.  Antithrombotics:  -DVT/anticoagulation:  Pharmaceutical: Continue Lovenox  2/17- Very high risk of DVT due to SCI-will need 2-3 months of Lovenox per CHEST guidelines.   3/4- needs 2-3 Months of Lovenox based on distance walking.   3/8- waiting to see what she's walking at d/c to determine length of Lovenox             -antiplatelet therapy: N/A 3. Pain  Management: Continue Oxycodone prn.  2/21- pt reports pain is doing a little better- con't regimen  2/24- nerve pain still an issue, but had swelling with Lyrica, even at 75 mg TID-  Had to decrease to 25 mg TID, so now not that helpful. Will add Duloxetine 30 mg QHS for now and if tolerate,d increase dose.  3/3- off Lyrica- on Duloxetine 60 mg QHS- con't regimen  3/8- having N/T of Feet B/L- can increase Duloxetine to 90 mg QHS  since can't do Gabapentin or Lyrica.              Monitor with increased exertion, particularly neuropathic pain 4. Mood: LCSW to follow for evaluation and support.   3/3- will get pt Neuropsychology  3/4- d/w Neuropsych to see if they can see again.              -antipsychotic agents: N/A 5. Neuropsych: This patient is capable of making decisions on her own behalf. 6. Skin/Wound Care:  Monitor incision for healing.  Added protein supplement to help promote healing.  7. Fluids/Electrolytes/Nutrition: Monitor I/Os.              Monitor CMP 8. ABLA:              Monitor CBC 9. Hypokalemia/Hypomagnesemia: Has resolved with brief supplementation.              2/22- K+ 4.0 and last Mg 1.9- con't regimen  2/28: K+ reviewed, normalized to 4.2  3/4- labs again Monday  3/7- K+ 4.0- doing great 10. Macrocytosis/Polysubstance abuse:              Vitamin B12/folate levels ordered.   217- counseling given about polysubstance abuse 11. Neurogenic bowel and constipation: KUB ordered to determine stool burden.  2/17- moderate R sided stool burden with a lot of contrast left in colon- will order bowel program with dig stim nightly and con't Miralax BID for now- she has no control of stool per pt.   2/18- had 5+ BMs last night- feels cleaned out- now- kept saying had more in her- but now feels "great'.   2/26- no suppository last night, since nursing decided with multiple BMs yesterday to only do dig stim- no results, but small BM on toilet this AM. COn't bowel program  nightly  3/2- since going on toilet, will stop bowel program  3/3- LBM yesterday - having a lot of gas- d/w pt using fewer straws, but check with SLP first.   3/7- Pt having regular BMs with NO bowel program- going on toilet. 12. Neurogenic bladder  2/17- PVRs 0-100 cc so far- not on Flomax- con't regimen for now- has no control of bladder, of note.   2/24- added Myrbetriq 25 mg daily- no improvement so far- still wet all the time- basically zero PVRs- so trying timed voiding q2 hours.  2/25- no improvement so far with Myrbetriq and had (+) high PVRs yesterday -will stop Myrbetriq and do Flomax 0.4 mg qsupper and send to Urology outpt.   2/26- Flomax already helping bladder- less frequent urination and a little more control- con't Regimen  2/27- back to being "crazy" per pt- going very frequently- will con't and if no better Tuesday, will increase to 0.8 mg nightly   3/1- will check U/A and Cx- has been a few weeks- just to make sure- if negative, will d/w pt about foley since d/w Urology- they want us to start Vesicare AND Myrbetriq, get her into retention and cath, but she doesn't have good hand function, I'm concerned she won't be able to cath.   3/2- discussed foley vs being wet vs Vesicare/Myrbetriq- she wants to do foley- for now- until f/u with Urology.  3/3- foley placed- had a little leak last night- con't foley at this time.   3/4- (-) U/A basically- small leukocytes. con't foley  3/5 cont foley cath - ? Voiding trial next week  3/7- will d/w pt about going home wet vs cathing (  don't think she has hand fxn to cath) vs chronic foley?  3/8- wants to remove Foley and try timed voiding again- will try-  3/8- wants to remove Foley and try timed voiding again- will try-   13. Neuropathic pain  2/17- start Lyrica 50 mg TID for nerve pain- hopefully will help "being on fire".   2/18- slightly better today- con't regimen  2/21- Increased Lyrica to 75 mg TID since no side effects of meds? But the  arm swelling could be from Lyrica?  3/2- increased yesterday- con't regimen- stopped Lyrica  3/7- Pain better controlled- con't regimen 14. Spasticity  2/17- will start Baclofen 5 mg QID for muscle tightness/spasms.     2/24- spasms well controlled now- con't Baclofen  2/25- spasms controlled, but getting a little tighter- will con't baclofen and con't to monitor  3/7- will increase Baclofen to 15 mg TID for spasticity- watch for constipation and sedation.  15. Dry skin: Eucerin ordered, encouraged use.  16. B/L elbow/arm swelling  2/21- refused a medicine last night- could be cause of arm swelling. Will look into this. Held Lyrica last night- and swelling better- will d/w pt- asked her to refuse it today.    2/22- Lyrica decreased to 25 mg TID- swelling better  3/2- d/c Lyrica  3/7- swelling a lot better  3/8- down to trace LE swelling- off Lyrica.  17. Dysphagia  D2 thins diet  2/22- con't regimen until upgraded by SLP.  18. Urinary urgency/frequency  2/22- will try Myrbetriq 25 mg QHS for urinary frequency- will also check PVRs with addition of new meds, so not retaining, hopefully.   2/24- as per Neurogenic bladder  2/26- added Flomax yesterday- working [facility] better- con't Flomax   2/28: continue Flomax.   3/2- will d/c flomax and place foley since constant voiding.   3/3- is dry for first time since SCI- pt happy about this.  20. Severe calluses on Feet  2/27- add order that will have feet warmed with warm wet washcloth, then lotion applied, then vaseline over that BID- to help cracks/calluses.      LOS: 20 days A FACE TO FACE EVALUATION WAS PERFORMED  Megan Lovorn 07/31/2020, 9:11 AM

## 2020-07-31 NOTE — Progress Notes (Signed)
Foley catheter removed. Pt tolerated well. of urine emptied from bag. No complications noted. Pt educated on urinating after foley removal. Educated on timed toileting Qh. Mylo Red, LPN

## 2020-07-31 NOTE — Progress Notes (Signed)
Occupational Therapy Session Note  Patient Details  Name: Yvette Phillips MRN: [id number] Date of Birth: December 30, 1960  Today's Date: 07/31/2020 OT Individual Time: 0800-0900 OT Individual Time Calculation (min): 60 min    Short Term Goals: Week 3:  OT Short Term Goal 1 (Week 3): Pt will perform LB dressing with Mod A OT Short Term Goal 2 (Week 3): Pt will increase functional grip strength to perform oral hygiene w/ Supervision OT Short Term Goal 3 (Week 3): Pt will complete BSC/toilet transfers with CGA using LRAD OT Short Term Goal 4 (Week 3): Pt will stand with one hand support for 2+ minutes in order to complete LB ADL  Skilled Therapeutic Interventions/Progress Updates:    Pt resting in bed upon arrival. Pt required min A for moving LLE off EOB and sidelying to sit. Pt incontinent of bowel when standing. Pt completed bathing at shower level 2/2 bowel incontinence. Pt required assistance with buttocks and feet but completed all other bathing tasks without assistance. Pt max A for LB dressing. Pt dependent for threading foley bag through pants, donning Ted hose, and socks. Pt assisted with pulling pants over hips. UB dressing without assistance. Pt amb with RW to recliner. Pt remained in recliner with all needs within reach and seat alarm activated.  Therapy Documentation Precautions:  Precautions Precautions: Fall,Cervical Precaution Comments: s/p ACDF C4-5 (no cervical brace needed per acute), BUE/LE weakness Restrictions Weight Bearing Restrictions: No Pain:   Therapy/Group: Individual Therapy  [name] 07/31/2020, 9:18 AM

## 2020-07-31 NOTE — Progress Notes (Addendum)
Occupational Therapy Weekly Progress Note  Patient Details  Name: Yvette Phillips MRN: [id number] Date of Birth: 11/16/1960  Beginning of progress report period: July 24, 2020 End of progress report period: July 31, 2020   Patient has met 3 of 4 short term goals.  Pt is making steady progress towards LTGs. Pt requires min A for bathing tasks and supervision for UB dressing.  Pt continues to requires max A for LB dressing tasks including donning Ted hose and socks/shoes. Functional transfers with CGA. Standing balance for functional tasks with CGA. Pt requires max A for toileiting tasks.   Patient continues to demonstrate the following deficits: muscle weakness, decreased cardiorespiratoy endurance, impaired timing and sequencing, abnormal tone, unbalanced muscle activation, decreased coordination and decreased motor planning and decreased standing balance, decreased postural control and decreased balance strategies and therefore will continue to benefit from skilled OT intervention to enhance overall performance with BADL and Reduce care partner burden.  Patient progressing toward long term goals..  Continue plan of care. Goals upgraded d/t progress.   OT Short Term Goals Week 3:  OT Short Term Goal 1 (Week 3): Pt will perform LB dressing with Mod A OT Short Term Goal 1 - Progress (Week 3): Progressing toward goal OT Short Term Goal 2 (Week 3): Pt will increase functional grip strength to perform oral hygiene w/ Supervision OT Short Term Goal 2 - Progress (Week 3): Met OT Short Term Goal 3 (Week 3): Pt will complete BSC/toilet transfers with CGA using LRAD OT Short Term Goal 3 - Progress (Week 3): Met OT Short Term Goal 4 (Week 3): Pt will stand with one hand support for 2+ minutes in order to complete LB ADL OT Short Term Goal 4 - Progress (Week 3): Met Week 4:  OT Short Term Goal 1 (Week 4): STG=LTG 2/2 ELOS   Leotis Shames [facility] 07/31/2020, 10:40 AM

## 2020-08-01 NOTE — Plan of Care (Signed)
  Problem: Sit to Stand Goal: LTG:  Patient will perform sit to stand with assistance level (PT) Description: LTG:  Patient will perform sit to stand with assistance level (PT) Flowsheets (Taken 08/01/2020 1538) LTG: PT will perform sit to stand in preparation for functional mobility with assistance level: (upgrade due to progress) Supervision/Verbal cueing Note: Upgrade due to progress   Problem: RH Bed Mobility Goal: LTG Patient will perform bed mobility with assist (PT) Description: LTG: Patient will perform bed mobility with assistance, with/without cues (PT). Flowsheets (Taken 08/01/2020 1538) LTG: Pt will perform bed mobility with assistance level of: (Upgrade due to progress) Supervision/Verbal cueing Note: Upgrade due to progress   Problem: RH Bed to Chair Transfers Goal: LTG Patient will perform bed/chair transfers w/assist (PT) Description: LTG: Patient will perform bed to chair transfers with assistance (PT). Flowsheets (Taken 08/01/2020 1538) LTG: Pt will perform Bed to Chair Transfers with assistance level: (Upgrade due to progress) Supervision/Verbal cueing Note: Upgrade due to progress   Problem: RH Car Transfers Goal: LTG Patient will perform car transfers with assist (PT) Description: LTG: Patient will perform car transfers with assistance (PT). Flowsheets (Taken 08/01/2020 1538) LTG: Pt will perform car transfers with assist:: (Upgrade due to progress) Supervision/Verbal cueing Note: Upgrade due to progress   Problem: RH Ambulation Goal: LTG Patient will ambulate in controlled environment (PT) Description: LTG: Patient will ambulate in a controlled environment, # of feet with assistance (PT). Flowsheets (Taken 08/01/2020 1538) LTG: Pt will ambulate in controlled environ  assist needed:: (Upgrade due to progress) Supervision/Verbal cueing LTG: Ambulation distance in controlled environment: 150 ft with LRAD Note: Upgrade due to progress Goal: LTG Patient will ambulate in home  environment (PT) Description: LTG: Patient will ambulate in home environment, # of feet with assistance (PT). Flowsheets (Taken 08/01/2020 1538) LTG: Pt will ambulate in home environ  assist needed:: (Upgrade due to progress) Supervision/Verbal cueing LTG: Ambulation distance in home environment: 75 ft with LRAD Note: Upgrade due to progress

## 2020-08-01 NOTE — Progress Notes (Signed)
Physical Therapy Weekly Progress Note  Patient Details  Name: Yvette Phillips MRN: [id number] Date of Birth: 13-Dec-1960  Beginning of progress report period: July 25, 2020 End of progress report period: August 01, 2020  Today's Date: 08/01/2020 PT Individual Time: 1100-1200 PT Individual Time Calculation (min): 60 min   Patient has met 2 of 3 short term goals.  Pt is making good progress towards therapy goals. She does still require up to mod A for bed mobility for assist bringing BLE back into bed with sit to supine, is min A overall for sit to stand and stand pivot transfers with RW, can ambulate 200 ft (+) with min A and use of RW, and has initiate stair training this week. Pt is also at Supervision level for w/c mobility up to 100 ft with use of BUE. Pt does continue to exhibit impaired strength in BUE and BLE limiting her independence and safety with functional mobility at this time.  Patient continues to demonstrate the following deficits muscle weakness, decreased cardiorespiratoy endurance, abnormal tone, unbalanced muscle activation and decreased coordination and decreased sitting balance, decreased standing balance, decreased postural control and decreased balance strategies and therefore will continue to benefit from skilled PT intervention to increase functional independence with mobility.  Patient progressing toward long term goals..  Plan of care revisions: upgraded goals to Supervision level overall and increased gait goal distance.  PT Short Term Goals Week 4:  PT Short Term Goal 1 (Week 4): Pt will perform bed mobility with min A consistently PT Short Term Goal 2 (Week 4): Pt ascend/descend 4 x 6" stairs with max A PT Short Term Goal 3 (Week 4): Pt will perform least restrictive transfer with CGA consistently  Skilled Therapeutic Interventions/Progress Updates:    Pt received seated in recliner in room, agreeable to PT session. Pt reports some soreness in her knees this AM, not  rated and declines intervention. Sit to stand with min A to RW throughout session. Ambulation into bathroom with RW and min A. Pt requires some assist for clothing management and is dependent for pericare following continent BM. Ambulation to sink with RW and min A. Pt able to maintain standing balance while washing hands with CGA at sink. Ambulation x 214', x 125' with RW and CGA to min A, one instance of knees buckling but pt able to recover with min A. Pt exhibits decreased DF bilaterally during gait and compensates with steppage gait pattern. Ascend/descend one x 3" step with B handrails and min A for balance, x 5 reps bilaterally. Tried to ascend 3" step backwards in order to work on eccentric control stepping down from 3" step, pt unable to safely ascend step backwards. Pt returned to room and left seated in recliner in room with needs in reach, chair alarm in place at end of session.  Therapy Documentation Precautions:  Precautions Precautions: Fall,Cervical Precaution Comments: s/p ACDF C4-5 (no cervical brace needed per acute), BUE/LE weakness Restrictions Weight Bearing Restrictions: No   Therapy/Group: Individual Therapy   Excell Seltzer, PT, DPT 08/01/2020, 3:32 PM

## 2020-08-01 NOTE — Progress Notes (Signed)
PROGRESS NOTE   Subjective/Complaints:  R eye stye is getting bigger, but close to head, like about to drain.   Per staff, pt is incontinent overnight, but was continent yesterday and per OT< was continent this AM with a small BM.   Also, legs less tight with Baclofen- no side effects of baclofen.   ROS:  Pt denies SOB, abd pain, CP, N/V/C/D, and vision changes   Objective:   No results found. Recent Labs    07/30/20 0636  WBC 8.3  HGB 12.6  HCT 36.1  PLT 312   Recent Labs    07/30/20 0636  NA 135  K 4.0  CL 99  CO2 26  GLUCOSE 89  BUN 15  CREATININE 0.76  CALCIUM 9.7    Intake/Output Summary (Last 24 hours) at 08/01/2020 0830 Last data filed at 07/31/2020 1800 Gross per 24 hour  Intake 600 ml  Output 150 ml  Net 450 ml        Physical Exam: Vital Signs Blood pressure 134/77, pulse 78, temperature 98.5 F (36.9 C), resp. rate 16, height 5\' 7"  (1.702 m), weight 81.4 kg, SpO2 97 %.      General: awake, alert, appropriate, sitting up in w/c at sink, doing grooming with OT, NAD HENT: conjugate gaze; oropharynx moist- R underlid stye- 2x the size as yesterday- has a white head- about to drain? CV: regular rate and rhythm; no JVD Pulmonary: CTA B/L; no W/R/R- good air movement GI: soft, NT, ND, (+)BS Psychiatric: appropriate- smiling Neurological: Ox3 Foley in place Musculoskeletal:     Cervical back: Normal range of motion and neck supple.     Comments: Left knee pain with flexion.  Left knee with tenderness and mild edema Skin:    General: Skin is warm and dry.     Comments: Tinea pedis bilaterally with cracked right heel and thick toe nails.    Swelling much improved in LEs- without Lyrica is trace in feet/ankles Neurological: Ox3 Strong Hoffman's B/L MAS of 1 in LEs- slightly better Motor: Bilateral upper extremities: Shoulder abduction, elbow flexion/extension 4/5, wrist extension 3+/5,  and intrinsics 2/5 Right lower extremity: Hip flexion, knee extension 2+/5, ankle dorsiflexion 4/5 no change Left lower extremity: Hip flexion, knee extension 3 -/5, ankle dorsiflexion 4/5 no change Sensation to light touch diminished distal to elbows and knees    Assessment/Plan: 1. Functional deficits which require 3+ hours per day of interdisciplinary therapy in a comprehensive inpatient rehab setting.  Physiatrist is providing close team supervision and 24 hour management of active medical problems listed below.  Physiatrist and rehab team continue to assess barriers to discharge/monitor patient progress toward functional and medical goals  Care Tool:  Bathing    Body parts bathed by patient: Right arm,Left arm,Chest,Abdomen,Front perineal area,Buttocks,Right upper leg,Left upper leg,Face   Body parts bathed by helper: Buttocks,Right lower leg,Left lower leg Body parts n/a: Left lower leg,Right lower leg   Bathing assist Assist Level: Minimal Assistance - Patient > 75%     Upper Body Dressing/Undressing Upper body dressing   What is the patient wearing?: Pull over shirt    Upper body assist Assist Level: Set up assist  Lower Body Dressing/Undressing Lower body dressing      What is the patient wearing?: Pants,Incontinence brief     Lower body assist Assist for lower body dressing: Maximal Assistance - Patient 25 - 49%     Toileting Toileting    Toileting assist Assist for toileting: Maximal Assistance - Patient 25 - 49%     Transfers Chair/bed transfer  Transfers assist     Chair/bed transfer assist level: Contact Guard/Touching assist     Locomotion Ambulation   Ambulation assist   Ambulation activity did not occur: Safety/medical concerns  Assist level: Contact Guard/Touching assist Assistive device: Walker-rolling Max distance: 120   Walk 10 feet activity   Assist  Walk 10 feet activity did not occur: Safety/medical concerns  Assist  level: Contact Guard/Touching assist Assistive device: Walker-rolling   Walk 50 feet activity   Assist Walk 50 feet with 2 turns activity did not occur: Safety/medical concerns  Assist level: Contact Guard/Touching assist Assistive device: Walker-rolling    Walk 150 feet activity   Assist Walk 150 feet activity did not occur: Safety/medical concerns    Assistive device: Walker-rolling    Walk 10 feet on uneven surface  activity   Assist Walk 10 feet on uneven surfaces activity did not occur: Safety/medical concerns         Wheelchair     Assist Will patient use wheelchair at discharge?: Yes Type of Wheelchair: Manual    Wheelchair assist level: Supervision/Verbal cueing Max wheelchair distance: 120    Wheelchair 50 feet with 2 turns activity    Assist        Assist Level: Minimal Assistance - Patient > 75%   Wheelchair 150 feet activity     Assist      Assist Level: Moderate Assistance - Patient 50 - 74%   Blood pressure 134/77, pulse 78, temperature 98.5 F (36.9 C), resp. rate 16, height 5\' 7"  (1.702 m), weight 81.4 kg, SpO2 97 %.  Medical Problem List and Plan: 1.  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             -patient may shower             -ELOS/Goals: 14 to 17 days/supervision/min a            Continue CIR PT, OT, SLP 2.  Antithrombotics:  -DVT/anticoagulation:  Pharmaceutical: Continue Lovenox  2/17- Very high risk of DVT due to SCI-will need 2-3 months of Lovenox per CHEST guidelines.   3/4- needs 2-3 Months of Lovenox based on distance walking.   3/8- waiting to see what she's walking at d/c to determine length of Lovenox  3/9- moved d/c date to 3/24 due to needing to be mod I to supervision at d/c.              -antiplatelet therapy: N/A 3. Pain Management: Continue Oxycodone prn.  2/21- pt reports pain is doing a  little better- con't regimen  2/24- nerve pain still an issue, but had swelling with Lyrica, even at 75 mg TID-  Had to decrease to 25 mg TID, so now not that helpful. Will add Duloxetine 30 mg QHS for now and if tolerate,d increase dose.  3/3- off Lyrica- on Duloxetine 60 mg QHS- con't regimen  3/8- having N/T of Feet B/L- can increase Duloxetine to 90 mg QHS since can't do Gabapentin or Lyrica.   3/9- no side effects from  increased dose- feeling better             Monitor with increased exertion, particularly neuropathic pain 4. Mood: LCSW to follow for evaluation and support.   3/3- will get pt Neuropsychology  3/4- d/w Neuropsych to see if they can see again.              -antipsychotic agents: N/A 5. Neuropsych: This patient is capable of making decisions on her own behalf. 6. Skin/Wound Care:  Monitor incision for healing.  Added protein supplement to help promote healing.  7. Fluids/Electrolytes/Nutrition: Monitor I/Os.              Monitor CMP 8. ABLA:              Monitor CBC 9. Hypokalemia/Hypomagnesemia: Has resolved with brief supplementation.              2/22- K+ 4.0 and last Mg 1.9- con't regimen  2/28: K+ reviewed, normalized to 4.2  3/4- labs again Monday  3/7- K+ 4.0- doing great 10. Macrocytosis/Polysubstance abuse:              Vitamin B12/folate levels ordered.   217- counseling given about polysubstance abuse 11. Neurogenic bowel and constipation: KUB ordered to determine stool burden.  2/17- moderate R sided stool burden with a lot of contrast left in colon- will order bowel program with dig stim nightly and con't Miralax BID for now- she has no control of stool per pt.   2/18- had 5+ BMs last night- feels cleaned out- now- kept saying had more in her- but now feels "great'.   2/26- no suppository last night, since nursing decided with multiple BMs yesterday to only do dig stim- no results, but small BM on toilet this AM. COn't bowel program nightly  3/2- since  going on toilet, will stop bowel program  3/3- LBM yesterday - having a lot of gas- d/w pt using fewer straws, but check with SLP first.   3/7- Pt having regular BMs with NO bowel program- going on toilet.  3/9- small BM on toilet this AM- con't PO meds 12. Neurogenic bladder  2/17- PVRs 0-100 cc so far- not on Flomax- con't regimen for now- has no control of bladder, of note.   2/24- added Myrbetriq 25 mg daily- no improvement so far- still wet all the time- basically zero PVRs- so trying timed voiding q2 hours.  2/25- no improvement so far with Myrbetriq and had (+) high PVRs yesterday -will stop Myrbetriq and do Flomax 0.4 mg qsupper and send to Urology outpt.   2/26- Flomax already helping bladder- less frequent urination and a little more control- con't Regimen  2/27- back to being "crazy" per pt- going very frequently- will con't and if no better Tuesday, will increase to 0.8 mg nightly   3/1- will check U/A and Cx- has been a few weeks- just to make sure- if negative, will d/w pt about foley since d/w Urology- they want [place] to start Vesicare AND Myrbetriq, get her into retention and cath, but she doesn't have good hand function, I'm concerned she won't be able to cath.   3/2- discussed foley vs being wet vs Vesicare/Myrbetriq- she wants to do foley- for now- until f/u with Urology.  3/3- foley placed- had a little leak last night- con't foley at this time.   3/4- (-) U/A basically- small leukocytes. con't foley  3/5 cont foley cath - ? Voiding trial next week  3/7- will  d/w pt about going home wet vs cathing (don't think she has hand fxn to cath) vs chronic foley?  3/8- wants to remove Foley and try timed voiding again- will try-  3/8- wants to remove Foley and try timed voiding again- will try-  3/9- continent during day- not at night so far- less irritable bladder, not so frequent. con't regimen   13. Neuropathic pain  2/17- start Lyrica 50 mg TID for nerve pain- hopefully will help "being  on fire".   2/18- slightly better today- con't regimen  2/21- Increased Lyrica to 75 mg TID since no side effects of meds? But the arm swelling could be from Lyrica?  3/2- increased yesterday- con't regimen- stopped Lyrica  3/7- Pain better controlled- con't regimen 14. Spasticity  2/17- will start Baclofen 5 mg QID for muscle tightness/spasms.     2/24- spasms well controlled now- con't Baclofen  2/25- spasms controlled, but getting a little tighter- will con't baclofen and con't to monitor  3/7- will increase Baclofen to 15 mg TID for spasticity- watch for constipation and sedation.  3/9- improved Sx's of tightness of LEs- no side effects so far per pt.   15. Dry skin: Eucerin ordered, encouraged use.  16. B/L elbow/arm swelling  2/21- refused a medicine last night- could be cause of arm swelling. Will look into this. Held Lyrica last night- and swelling better- will d/w pt- asked her to refuse it today.    2/22- Lyrica decreased to 25 mg TID- swelling better  3/2- d/c Lyrica  3/7- swelling a lot better  3/8- down to trace LE swelling- off Lyrica.  17. Dysphagia  D2 thins diet  2/22- con't regimen until upgraded by SLP.  18. Urinary urgency/frequency  2/22- will try Myrbetriq 25 mg QHS for urinary frequency- will also check PVRs with addition of new meds, so not retaining, hopefully.   2/24- as per Neurogenic bladder  2/26- added Flomax yesterday- working [facility] better- con't Flomax   2/28: continue Flomax.   3/2- will d/c flomax and place foley since constant voiding.   3/3- is dry for first time since SCI- pt happy about this.   3/9- foley removed- con't timed voiding.  20. Severe calluses on Feet  2/27- add order that will have feet warmed with warm wet washcloth, then lotion applied, then vaseline over that BID- to help cracks/calluses.      LOS: 21 days A FACE TO FACE EVALUATION WAS PERFORMED  Burma Ketcher 08/01/2020, 8:30 AM

## 2020-08-01 NOTE — Progress Notes (Signed)
Patient ID: Yvette Phillips, female   DOB: 1960-07-28, 60 y.o.   MRN: [id number]  SW spoke with pt sister Alona Bene and her husband [phone]) to provide updates from team conference, changes in pt d/c date to 3/24, discussed ability to provide physical assistance to patient at discharge, and family education. Pt sister and husband are willing to offer her an option to be at their home if unable to find a nursing home that is able to extend assistance. SW informed unable to guarantee that an nursing home will accept. Willing to allow her to come to their home if no other options.   SW received approval from [employer] for [employer].   SW spoke with Lori/[employer] Disability Specialist [phone]) who reported need original forms. SW mailed signed signature forms for pt.   Cecile Sheerer, MSW, LCSWA Office: [phone] Cell: [phone] Fax: [phone]

## 2020-08-01 NOTE — Progress Notes (Signed)
Occupational Therapy Session Note  Patient Details  Name: Yvette Phillips MRN: [id number] Date of Birth: April 14, 1961  Today's Date: 08/01/2020 OT Individual Time: 0630-1601 OT Individual Time Calculation (min): 75 min    Short Term Goals: Week 4:  OT Short Term Goal 1 (Week 4): STG=LTG 2/2 ELOS  Skilled Therapeutic Interventions/Progress Updates:    Pt resting in bed upon arrival and ready to get OOB. Pt requested to use toilet. Supine>sit EOB with min A. Sit<>stand and amb with RW to bathroom with CGA. Toileting with max A. Pt returned to sink and completed bathing/dressing tasks. See Care Tool for assist levels. Pt amb with RW to recliner to eat breakfast. Pt able to place tubing on utensils and open all containers without assistance. Self feeding with supervision. Pt remained in recliner with all needs within reach and seat alarm activated.   Therapy Documentation Precautions:  Precautions Precautions: Fall,Cervical Precaution Comments: s/p ACDF C4-5 (no cervical brace needed per acute), BUE/LE weakness Restrictions Weight Bearing Restrictions: No   Pain:  Pt denies pain this morning  Therapy/Group: Individual Therapy  [name] 08/01/2020, 8:20 AM

## 2020-08-01 NOTE — Progress Notes (Signed)
Occupational Therapy Session Note  Patient Details  Name: Yvette Phillips MRN: [id number] Date of Birth: January 15, 1961  Today's Date: 08/01/2020 OT Individual Time: 1356-1440 OT Individual Time Calculation (min): 44 min    Short Term Goals: Week 3:  OT Short Term Goal 1 (Week 3): Pt will perform LB dressing with Mod A OT Short Term Goal 1 - Progress (Week 3): Progressing toward goal OT Short Term Goal 2 (Week 3): Pt will increase functional grip strength to perform oral hygiene w/ Supervision OT Short Term Goal 2 - Progress (Week 3): Met OT Short Term Goal 3 (Week 3): Pt will complete BSC/toilet transfers with CGA using LRAD OT Short Term Goal 3 - Progress (Week 3): Met OT Short Term Goal 4 (Week 3): Pt will stand with one hand support for 2+ minutes in order to complete LB ADL OT Short Term Goal 4 - Progress (Week 3): Met Week 4:  OT Short Term Goal 1 (Week 4): STG=LTG 2/2 ELOS   Skilled Therapeutic Interventions/Progress Updates:    Pt greeted at time of session sitting up in recliner just finished eating lunch, agreeable to OT session no pain. Agreeable to try to toilet d/t reports that now Foley is out she has been inconsistent with continence. Sit > stand Min A and walked to bathroom CGA with extended time, transferred to toilet same manner. Mod/Max for clothing management especially for brief but pt able to complete pericare in standing for hygiene CGA. Walked > sink  For hand hygiene > wheelchair CGA and 1 LOB noted posteriorly w/ Min A to recover. Transported via wheelchair to gym and performed 2 rounds of standing at [facility] for different activities to work on GMC/FMC for [place], able at times to stand with no UE support but needed Min A for standing balance, 3 minutes each round w/ some fatigue noted. Transported back to room where pt received phone call, NT present and relayed how pt would transfer to bed and acknowledged. Hand off to NT.   Therapy Documentation Precautions:   Precautions Precautions: Fall,Cervical Precaution Comments: s/p ACDF C4-5 (no cervical brace needed per acute), BUE/LE weakness Restrictions Weight Bearing Restrictions: No     Therapy/Group: Individual Therapy  Viona Gilmore 08/01/2020, 2:43 PM

## 2020-08-02 MED ORDER — SENNA 8.6 MG PO TABS
1.0000 | ORAL_TABLET | Freq: Two times a day (BID) | ORAL | Status: DC
  Administered 2020-08-02 – 2020-08-04 (×5): 8.6 mg via ORAL
  Filled 2020-08-02 (×6): qty 1

## 2020-08-02 NOTE — Progress Notes (Signed)
PROGRESS NOTE   Subjective/Complaints:  Pt reports had extra small BM this AM- feels real constipated- hard to get stool out.  Is continent during day with urine/stool- has problems some at night with accidents.  Timed voiding is working during day per pt.   Stye feels smaller per pt. Hasn't "busted".    ROS:  Pt denies SOB, abd pain, CP, N/V/C/D, and vision changes   Objective:   No results found. No results for input(s): WBC, HGB, HCT, PLT in the last 72 hours. No results for input(s): NA, K, CL, CO2, GLUCOSE, BUN, CREATININE, CALCIUM in the last 72 hours.  Intake/Output Summary (Last 24 hours) at 08/02/2020 0839 Last data filed at 08/02/2020 0813 Gross per 24 hour  Intake 594 ml  Output -  Net 594 ml        Physical Exam: Vital Signs Blood pressure 130/77, pulse 92, temperature 98.2 F (36.8 C), temperature source Oral, resp. rate 17, height 5\' 7"  (1.702 m), weight 81.8 kg, SpO2 97 %.       General: awake, alert, appropriate, on toilet- walked with RW to w/c in room with OT, NAD HENT: conjugate gaze; oropharynx moist; Stye still there R lower eye-slightly smaller  CV: regular rate and rhythm; no JVD Pulmonary: CTA B/L; no W/R/R- good air movement GI: soft, NT, ND, (+)BS- hypoactive Psychiatric: appropriate and smiling slightly Neurological: Ox3 MAS of 1 to 1+ in LEs Musculoskeletal:     Cervical back: Normal range of motion and neck supple.     Comments: Left knee pain with flexion.  Left knee with tenderness and mild edema Skin:    General: Skin is warm and dry.     Comments: Tinea pedis bilaterally with cracked right heel and thick toe nails.    Swelling much improved in LEs- without Lyrica is trace in feet/ankles Neurological: Ox3 Strong Hoffman's B/L MAS of 1 in LEs- slightly better Motor: Bilateral upper extremities: Shoulder abduction, elbow flexion/extension 4/5, wrist extension 3+/5, and  intrinsics 2/5 Right lower extremity: Hip flexion, knee extension 2+/5, ankle dorsiflexion 4/5 no change Left lower extremity: Hip flexion, knee extension 3 -/5, ankle dorsiflexion 4/5 no change Sensation to light touch diminished distal to elbows and knees    Assessment/Plan: 1. Functional deficits which require 3+ hours per day of interdisciplinary therapy in a comprehensive inpatient rehab setting.  Physiatrist is providing close team supervision and 24 hour management of active medical problems listed below.  Physiatrist and rehab team continue to assess barriers to discharge/monitor patient progress toward functional and medical goals  Care Tool:  Bathing    Body parts bathed by patient: Right arm,Left arm,Chest,Abdomen,Front perineal area,Buttocks,Right upper leg,Left upper leg,Face   Body parts bathed by helper: Buttocks,Right lower leg,Left lower leg Body parts n/a: Left lower leg,Right lower leg   Bathing assist Assist Level: Minimal Assistance - Patient > 75%     Upper Body Dressing/Undressing Upper body dressing   What is the patient wearing?: Pull over shirt    Upper body assist Assist Level: Set up assist    Lower Body Dressing/Undressing Lower body dressing      What is the patient wearing?: Pants,Incontinence brief  Lower body assist Assist for lower body dressing: Maximal Assistance - Patient 25 - 49%     Toileting Toileting    Toileting assist Assist for toileting: Maximal Assistance - Patient 25 - 49%     Transfers Chair/bed transfer  Transfers assist     Chair/bed transfer assist level: Minimal Assistance - Patient > 75%     Locomotion Ambulation   Ambulation assist   Ambulation activity did not occur: Safety/medical concerns  Assist level: Minimal Assistance - Patient > 75% Assistive device: Walker-rolling Max distance: 214'   Walk 10 feet activity   Assist  Walk 10 feet activity did not occur: Safety/medical  concerns  Assist level: Minimal Assistance - Patient > 75% Assistive device: Walker-rolling   Walk 50 feet activity   Assist Walk 50 feet with 2 turns activity did not occur: Safety/medical concerns  Assist level: Minimal Assistance - Patient > 75% Assistive device: Walker-rolling    Walk 150 feet activity   Assist Walk 150 feet activity did not occur: Safety/medical concerns  Assist level: Minimal Assistance - Patient > 75% Assistive device: Walker-rolling    Walk 10 feet on uneven surface  activity   Assist Walk 10 feet on uneven surfaces activity did not occur: Safety/medical concerns         Wheelchair     Assist Will patient use wheelchair at discharge?: Yes Type of Wheelchair: Manual    Wheelchair assist level: Supervision/Verbal cueing Max wheelchair distance: 120    Wheelchair 50 feet with 2 turns activity    Assist        Assist Level: Minimal Assistance - Patient > 75%   Wheelchair 150 feet activity     Assist      Assist Level: Moderate Assistance - Patient 50 - 74%   Blood pressure 130/77, pulse 92, temperature 98.2 F (36.8 C), temperature source Oral, resp. rate 17, height 5\' 7"  (1.702 m), weight 81.8 kg, SpO2 97 %.  Medical Problem List and Plan: 1.  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             -patient may shower             -ELOS/Goals: 14 to 17 days/supervision/min a            Continue CIR PT, OT, SLP 2.  Antithrombotics:  -DVT/anticoagulation:  Pharmaceutical: Continue Lovenox  2/17- Very high risk of DVT due to SCI-will need 2-3 months of Lovenox per CHEST guidelines.   3/4- needs 2-3 Months of Lovenox based on distance walking.   3/8- waiting to see what she's walking at d/c to determine length of Lovenox  3/9- moved d/c date to 3/24 due to needing to be mod I to supervision at d/c.               -antiplatelet therapy: N/A 3. Pain Management: Continue Oxycodone prn.  2/21- pt reports pain is doing a little better- con't regimen  2/24- nerve pain still an issue, but had swelling with Lyrica, even at 75 mg TID-  Had to decrease to 25 mg TID, so now not that helpful. Will add Duloxetine 30 mg QHS for now and if tolerate,d increase dose.  3/3- off Lyrica- on Duloxetine 60 mg QHS- con't regimen  3/8- having N/T of Feet B/L- can increase Duloxetine to 90 mg QHS since can't do Gabapentin or Lyrica.   3/9- no side effects from increased  dose- feeling better  3/10- pt reports pain is controlled- con't regimen             Monitor with increased exertion, particularly neuropathic pain 4. Mood: LCSW to follow for evaluation and support.   3/3- will get pt Neuropsychology  3/4- d/w Neuropsych to see if they can see again.              -antipsychotic agents: N/A 5. Neuropsych: This patient is capable of making decisions on her own behalf. 6. Skin/Wound Care:  Monitor incision for healing.  Added protein supplement to help promote healing.  7. Fluids/Electrolytes/Nutrition: Monitor I/Os.              Monitor CMP 8. ABLA:              Monitor CBC 9. Hypokalemia/Hypomagnesemia: Has resolved with brief supplementation.              2/22- K+ 4.0 and last Mg 1.9- con't regimen  2/28: K+ reviewed, normalized to 4.2  3/4- labs again Monday  3/7- K+ 4.0- doing great 10. Macrocytosis/Polysubstance abuse:              Vitamin B12/folate levels ordered.   217- counseling given about polysubstance abuse 11. Neurogenic bowel and constipation: KUB ordered to determine stool burden.  2/17- moderate R sided stool burden with a lot of contrast left in colon- will order bowel program with dig stim nightly and con't Miralax BID for now- she has no control of stool per pt.   2/18- had 5+ BMs last night- feels cleaned out- now- kept saying had more in her- but now feels "great'.   2/26- no suppository last night,  since nursing decided with multiple BMs yesterday to only do dig stim- no results, but small BM on toilet this AM. COn't bowel program nightly  3/2- since going on toilet, will stop bowel program  3/3- LBM yesterday - having a lot of gas- d/w pt using fewer straws, but check with SLP first.   3/7- Pt having regular BMs with NO bowel program- going on toilet.  3/9- small BM on toilet this AM- con't PO meds  3/10- has gotten constipated from Baclofen increase- will add Senokot 1 tab BID to allow her to go better- will try and avoid loose stools 12. Neurogenic bladder  2/17- PVRs 0-100 cc so far- not on Flomax- con't regimen for now- has no control of bladder, of note.   2/24- added Myrbetriq 25 mg daily- no improvement so far- still wet all the time- basically zero PVRs- so trying timed voiding q2 hours.  2/25- no improvement so far with Myrbetriq and had (+) high PVRs yesterday -will stop Myrbetriq and do Flomax 0.4 mg qsupper and send to Urology outpt.   2/26- Flomax already helping bladder- less frequent urination and a little more control- con't Regimen  2/27- back to being "crazy" per pt- going very frequently- will con't and if no better Tuesday, will increase to 0.8 mg nightly   3/1- will check U/A and Cx- has been a few weeks- just to make sure- if negative, will d/w pt about foley since d/w Urology- they want [place] to start Vesicare AND Myrbetriq, get her into retention and cath, but she doesn't have good hand function, I'm concerned she won't be able to cath.   3/2- discussed foley vs being wet vs Vesicare/Myrbetriq- she wants to do foley- for now- until f/u with Urology.  3/3- foley placed- had  a little leak last night- con't foley at this time.   3/4- (-) U/A basically- small leukocytes. con't foley  3/5 cont foley cath - ? Voiding trial next week  3/7- will d/w pt about going home wet vs cathing (don't think she has hand fxn to cath) vs chronic foley?  3/8- wants to remove Foley and try  timed voiding again- will try-  3/8- wants to remove Foley and try timed voiding again- will try-  3/9- continent during day- not at night so far- less irritable bladder, not so frequent. con't regimen    3/10- timed voiding better with continence during day- will con't regimen 13. Neuropathic pain  2/17- start Lyrica 50 mg TID for nerve pain- hopefully will help "being on fire".   2/18- slightly better today- con't regimen  2/21- Increased Lyrica to 75 mg TID since no side effects of meds? But the arm swelling could be from Lyrica?  3/2- increased yesterday- con't regimen- stopped Lyrica  3/7- Pain better controlled- con't regimen 14. Spasticity  2/17- will start Baclofen 5 mg QID for muscle tightness/spasms.     2/24- spasms well controlled now- con't Baclofen  2/25- spasms controlled, but getting a little tighter- will con't baclofen and con't to monitor  3/7- will increase Baclofen to 15 mg TID for spasticity- watch for constipation and sedation.  3/9- improved Sx's of tightness of LEs- no side effects so far per pt.   15. Dry skin: Eucerin ordered, encouraged use.  16. B/L elbow/arm swelling  2/21- refused a medicine last night- could be cause of arm swelling. Will look into this. Held Lyrica last night- and swelling better- will d/w pt- asked her to refuse it today.    2/22- Lyrica decreased to 25 mg TID- swelling better  3/2- d/c Lyrica  3/7- swelling a lot better  3/8- down to trace LE swelling- off Lyrica.  17. Dysphagia  D2 thins diet  2/22- con't regimen until upgraded by SLP.  18. Urinary urgency/frequency  2/22- will try Myrbetriq 25 mg QHS for urinary frequency- will also check PVRs with addition of new meds, so not retaining, hopefully.   2/24- as per Neurogenic bladder  2/26- added Flomax yesterday- working [facility] better- con't Flomax   2/28: continue Flomax.   3/2- will d/c flomax and place foley since constant voiding.   3/3- is dry for first time since SCI- pt happy  about this.   3/9- foley removed- con't timed voiding.   3/10- bladder doing better as above.  20. Severe calluses on Feet  2/27- add order that will have feet warmed with warm wet washcloth, then lotion applied, then vaseline over that BID- to help cracks/calluses.      LOS: 22 days A FACE TO FACE EVALUATION WAS PERFORMED  Megan Lovorn 08/02/2020, 8:39 AM

## 2020-08-02 NOTE — Progress Notes (Signed)
Physical Therapy Session Note  Patient Details  Name: Yvette Phillips MRN: [id number] Date of Birth: 09-07-1960  Today's Date: 08/02/2020 PT Individual Time: 1317-1400 PT Individual Time Calculation (min): 43 min   Short Term Goals: Week 4:  PT Short Term Goal 1 (Week 4): Pt will perform bed mobility with min A consistently PT Short Term Goal 2 (Week 4): Pt ascend/descend 4 x 6" stairs with max A PT Short Term Goal 3 (Week 4): Pt will perform least restrictive transfer with CGA consistently  Skilled Therapeutic Interventions/Progress Updates:     Patient in recliner in the room upon PT arrival. Patient alert and agreeable to PT session. Patient denied pain during session, however, reported increased lower extremity fatigue today which she attributed to walking to and from the gym and performing stair training yesterday.  Therapeutic Activity: Bed Mobility: Patient performed sit to supine with supervision with cues for log roll technique to maintain cervical precautions. Transfers: Patient performed sit to/from stand x2 and stand pivot transfers with CGA using RW. Provided cues for scooting forward, forward weight shift and controlled descent for safety.   Gait Training: Patient ambulated 94 feet using RW with CGA and w/c follow for safety due to decreased activity tolerance. Ambulated with decreased gait speed, step height and step length, R extensor thrust with fatigue, forward trunk flexion, and B lateral hip instability. Provided cues for erect posture, proximity to RW, and increased gluteal and hamstring activation in stance for improved hip and knee stability with heavy focus on R leg to prevent extensor thrust.  Wheelchair Mobility: Patient propelled w/c >150 feet using B upper extremities with supervision. Provided cues for increased stroke length for improved momentum with propulsion and turning technique. Patient required total A for w/c parts management throughout session.    Neuromuscular Re-education: Patient performed the following activities for improved lower extremity motor control: -alternating step taps to 3" step 2x10 with standing rest break in between, focused on increased hamstring and hip flexor activation to clear foot on/off the step and hamstring and gluteal activation in stance limb for improved hip/knee stability -w/c propulsion with B lower extremities for improved hamstring activation and motor control with a functional activity ~50 feet before fatigue, required min-mod A for initiation and intermittent min A during propulsion, provided cues for scooting forward and bringing her trunk forward for improved leverage with hamstrings during activity  Patient in the bed with breaks locked, bed alarm set, and all needs in reach at end of session.   Therapy Documentation Precautions:  Precautions Precautions: Fall,Cervical Precaution Comments: s/p ACDF C4-5 (no cervical brace needed per acute), BUE/LE weakness Restrictions Weight Bearing Restrictions: No    Therapy/Group: Individual Therapy  Cherie L Grunenberg PT, DPT  08/02/2020, 5:07 PM

## 2020-08-02 NOTE — Progress Notes (Signed)
Occupational Therapy Session Note  Patient Details  Name: Yvette Phillips MRN: [id number] Date of Birth: 12-28-1960  Today's Date: 08/02/2020 OT Individual Time: 0700-0800 OT Individual Time Calculation (min): 60 min    Short Term Goals: Week 4:  OT Short Term Goal 1 (Week 4): STG=LTG 2/2 ELOS  Skilled Therapeutic Interventions/Progress Updates:    Pt eating breakfast in bed upon arrival. Supine>sit EOB with min A for pushing up from sidelying. Pt amb with RW to bathroom (CGA) and completed toileting tasks with mod A. Pt returned to w/c at sink and completed bathing and dressing with sit<>stand. CGA for all standing tasks. Pt requires max A for LB dressing. See Care Tool for assist levels. Pt requires more then a reasonable amount of time to complete tasks. Pt amb with RW to recliner and remained in recliner. Seat alarm activated and all needs within reach.   Therapy Documentation Precautions:  Precautions Precautions: Fall,Cervical Precaution Comments: s/p ACDF C4-5 (no cervical brace needed per acute), BUE/LE weakness Restrictions Weight Bearing Restrictions: No    Pain:  Pt denies pain this morning.     Therapy/Group: Individual Therapy  [name] 08/02/2020, 9:04 AM

## 2020-08-02 NOTE — Progress Notes (Signed)
Physical Therapy Session Note  Patient Details  Name: Yvette Phillips MRN: [id number] Date of Birth: 03/26/1961  Today's Date: 08/02/2020 PT Individual Time: 1000-1109 PT Individual Time Calculation (min): 69 min   Short Term Goals: Week 1:  PT Short Term Goal 1 (Week 1): pt to demonstrate supine<>sit mod A PT Short Term Goal 1 - Progress (Week 1): Met PT Short Term Goal 2 (Week 1): pt to demonstrate functional transfer with LRAD at mod A PT Short Term Goal 2 - Progress (Week 1): Met PT Short Term Goal 3 (Week 1): pt to demonstrate gait training with LRAD for 20' PT Short Term Goal 3 - Progress (Week 1): Progressing toward goal PT Short Term Goal 4 (Week 1): pt to demonstrate standing balance at mod A for at least 3 mins PT Short Term Goal 4 - Progress (Week 1): Met Week 2:  PT Short Term Goal 1 (Week 2): Pt will complete bed mobility with min A consistently PT Short Term Goal 1 - Progress (Week 2): Met PT Short Term Goal 2 (Week 2): Pt will perform transfers with LRAD and min A consistently PT Short Term Goal 2 - Progress (Week 2): Met PT Short Term Goal 3 (Week 2): Pt will ambulate x 50 ft with LRAD and mod A PT Short Term Goal 3 - Progress (Week 2): Met Week 3:  PT Short Term Goal 1 (Week 3): Pt will perform bed mobility with supervision. PT Short Term Goal 1 - Progress (Week 3): Progressing toward goal PT Short Term Goal 2 (Week 3): Pt will  ambulate with LRAD and min A x 100 ft. PT Short Term Goal 2 - Progress (Week 3): Met PT Short Term Goal 3 (Week 3): Pt will propel wc with supervision x 100 ft. PT Short Term Goal 3 - Progress (Week 3): Met Week 4:  PT Short Term Goal 1 (Week 4): Pt will perform bed mobility with min A consistently PT Short Term Goal 2 (Week 4): Pt ascend/descend 4 x 6" stairs with max A PT Short Term Goal 3 (Week 4): Pt will perform least restrictive transfer with CGA consistently  Skilled Therapeutic Interventions/Progress Updates:    Pain:  Pt reports 5/10  pain.  Treatment to tolerance.  Rest breaks and repositioning as needed.  Pt initially oob in recliner and agreeable to treatment session w/focus on functional mobility and LE strengthening.  Pt Sit to stand w/cues for sequencing and cga.  Gait 88f w/min assist due to bilat knee wobbles, buckling x 3 recovers w/min assist.  Transported to gym via wc. Sit to stand w/cga. Gait 51fw/cga, bilat knee wobbles but no knee buckling, improved posture and overall stability.  Stairs: Pt ascended/descended via backing single 3in step w/2 rails, leads w/RLE up/leads LLE down  X 4 reps w/min assist, decreased eccentric control backing w/moderate knee buckling but no loss of balance.  Pt rested in stiitng then repeated stairs as above.  Therex: Propelled wc x 2022f/bilat LEs w/min assist for hamstring strengthening LAQs w/2.5 lb L, 3lb R 2x10 for quad strengthening  wc propulsion w/bilat UEs x [id number] UE strengthening/functional moblity.  Gait: 50ft42fW, cga, deviations as above.   Sit to stand w/min assist from wc.  Stood w/supervision x 1 min, turn/sit to recliner w/cga.   Repeated Sit to stand from recliner 2x10 for LE strengthening.   Repositioned for comfort. Pt left oob in recliner w/chair alarm set and needs in reach.    Therapy  Documentation Precautions:  Precautions Precautions: Fall,Cervical Precaution Comments: s/p ACDF C4-5 (no cervical brace needed per acute), BUE/LE weakness Restrictions Weight Bearing Restrictions: No    Therapy/Group: Individual Therapy  Callie Fielding, [place] 08/02/2020, 12:19 PM

## 2020-08-02 NOTE — Progress Notes (Signed)
Patient refused her laxatives claims she had been having loose stools x2  and had to be cleaned last night because it was runny. Claims she will take one tonight.

## 2020-08-03 NOTE — Progress Notes (Signed)
Occupational Therapy Session Note  Patient Details  Name: Rielly Brunn MRN: [id number] Date of Birth: 04/21/1961  Today's Date: 08/03/2020 OT Individual Time: 1000-1045 OT Individual Time Calculation (min): 45 min    Short Term Goals: Week 4:  OT Short Term Goal 1 (Week 4): STG=LTG 2/2 ELOS  Skilled Therapeutic Interventions/Progress Updates:    Pt resting in recliner upon arrival.  Pt initially amb with RW approx 100' with CGA before sitting in w/c and transitioning to gym. Pt engaged in table task with graded clothes pins (red, green, and blue) placing on horizontal and vertical dowel. Pt pleased with improvement noted in grasp strength and being able to manipulate blue clothes pins. Pt returned to room and amb with RW from doorway to recliner. Pt remained in recliner with all needs within reach and seat alarm activated.   Therapy Documentation Precautions:  Precautions Precautions: Fall,Cervical Precaution Comments: s/p ACDF C4-5 (no cervical brace needed per acute), BUE/LE weakness Restrictions Weight Bearing Restrictions: No   Pain: Pt denies pain this morning  Therapy/Group: Individual Therapy  [name] 08/03/2020, 11:22 AM

## 2020-08-03 NOTE — Plan of Care (Signed)
  Problem: Consults Goal: RH SPINAL CORD INJURY PATIENT EDUCATION Description:  See Patient Education module for education specifics.  Outcome: Progressing Goal: Skin Care Protocol Initiated - if Braden Score 18 or less Description: If consults are not indicated, leave blank or document N/A Outcome: Progressing   Problem: SCI BOWEL ELIMINATION Goal: RH STG MANAGE BOWEL WITH ASSISTANCE Description: STG Manage Bowel with Mod I Assistance. Outcome: Progressing Goal: RH STG SCI MANAGE BOWEL WITH MEDICATION WITH ASSISTANCE Description: STG SCI Manage bowel with medication with Mod I assistance. Outcome: Progressing   Problem: SCI BLADDER ELIMINATION Goal: RH STG MANAGE BLADDER WITH ASSISTANCE Description: STG Manage Bladder With Mod I Assistance Outcome: Progressing   Problem: RH SAFETY Goal: RH STG ADHERE TO SAFETY PRECAUTIONS W/ASSISTANCE/DEVICE Description: STG Adhere to Safety Precautions With Mod I Assistance/Device. Outcome: Progressing   Problem: RH PAIN MANAGEMENT Goal: RH STG PAIN MANAGED AT OR BELOW PT'S PAIN GOAL Description: <3 on a 0-10 pain scale. Outcome: Progressing   Problem: RH KNOWLEDGE DEFICIT SCI Goal: RH STG INCREASE KNOWLEDGE OF SELF CARE AFTER SCI Description: Patient will demonstrate knowledge of medication management, bowel and bladder care, skin and wound care, dietary management with educational materials and handouts provided by staff. Outcome: Progressing   

## 2020-08-03 NOTE — Progress Notes (Signed)
Physical Therapy Session Note  Patient Details  Name: Yvette Phillips MRN: [id number] Date of Birth: Dec 18, 1960  Today's Date: 08/03/2020 PT Individual Time: 0271-4232 PT Individual Time Calculation (min): 58 min   Short Term Goals: Week 1:  PT Short Term Goal 1 (Week 1): pt to demonstrate supine<>sit mod A PT Short Term Goal 1 - Progress (Week 1): Met PT Short Term Goal 2 (Week 1): pt to demonstrate functional transfer with LRAD at mod A PT Short Term Goal 2 - Progress (Week 1): Met PT Short Term Goal 3 (Week 1): pt to demonstrate gait training with LRAD for 20' PT Short Term Goal 3 - Progress (Week 1): Progressing toward goal PT Short Term Goal 4 (Week 1): pt to demonstrate standing balance at mod A for at least 3 mins PT Short Term Goal 4 - Progress (Week 1): Met Week 2:  PT Short Term Goal 1 (Week 2): Pt will complete bed mobility with min A consistently PT Short Term Goal 1 - Progress (Week 2): Met PT Short Term Goal 2 (Week 2): Pt will perform transfers with LRAD and min A consistently PT Short Term Goal 2 - Progress (Week 2): Met PT Short Term Goal 3 (Week 2): Pt will ambulate x 50 ft with LRAD and mod A PT Short Term Goal 3 - Progress (Week 2): Met  Skilled Therapeutic Interventions/Progress Updates:    pt received in recliner and agreeable to therapy. Pt directed in Stand pivot transfer with Rolling walker to WC min A. Pt then directed in [place] mobility to 100' CGA. Pt taken to gym, directed in several Sit to stand throughout session min A grossly. Pt then directed in standing toe taps 4x5 with walker mod A first set and improved to min A. Pt then directed in gait 58' with Rolling walker CGA. Pt directed in ascending/descending one stair with B handrails mod A x2 completed, rest break then x3, rest break x4 (one step only repeated). Pt very anxious about completed multiple stairs at once but able to fully step onto one step and off. Pt then directed in 70' gait raining with Rolling walker  at [facility]. Pt then directed iN WC mobility back to room 150' CGA. Pt left with nursing for medications in WC and in good condition. Pt denied pain.   Therapy Documentation Precautions:  Precautions Precautions: Fall,Cervical Precaution Comments: s/p ACDF C4-5 (no cervical brace needed per acute), BUE/LE weakness Restrictions Weight Bearing Restrictions: No General:   Vital Signs: Therapy Vitals Temp: 98.5 F (36.9 C) Pulse Rate: 84 Resp: 19 BP: 122/82 Patient Position (if appropriate): Sitting Oxygen Therapy SpO2: 99 % O2 Device: Room Air Pain:   Mobility:   Locomotion :    Trunk/Postural Assessment :    Balance:   Exercises:   Other Treatments:      Therapy/Group: Individual Therapy  Junie Panning 08/03/2020, 3:39 PM

## 2020-08-03 NOTE — Progress Notes (Signed)
Occupational Therapy Session Note  Patient Details  Name: Yvette Phillips MRN: [id number] Date of Birth: [birth year]  Today's Date: 08/03/2020 OT Individual Time: 0700-0810 OT Individual Time Calculation (min): 70 min    Short Term Goals: Week 4:  OT Short Term Goal 1 (Week 4): STG=LTG 2/2 ELOS  Skilled Therapeutic Interventions/Progress Updates:    Pt resting in bed upon arrival.  OT intervention with focus on bed mobility, sit<>stand, standing balance, functional amb with RW, toileting, bathing/dressing with sit<>stand from w/c at sink, and self feeding to increase independence with BADLs. Pt requires min A for sidelying>sit EOB. Sit<>stand with CGA. Functional amb with RW to bathroom with CGA. Toileting with mod A. UB bathing/dressing with supervsion. LB bathing with min A. LB dressing with max A. Pt able to open all containers and place tubing on utensils in preparation for self feeding with supervision. Pt remained in recliner with all needs within reach and seat alarm acativated.   Therapy Documentation Precautions:  Precautions Precautions: Fall,Cervical Precaution Comments: s/p ACDF C4-5 (no cervical brace needed per acute), BUE/LE weakness Restrictions Weight Bearing Restrictions: No  Pain:  Therapy/Group: Individual Therapy  [name] 08/03/2020, 8:17 AM

## 2020-08-03 NOTE — Progress Notes (Signed)
PROGRESS NOTE   Subjective/Complaints:  Stye has drained/busted as of yesterday AM- was stinging yesterday so used warm compresses a lot, but better this AM.   Still has tingling to toes- tylenol helps in addition to nerve pain meds.    ROS:  Pt denies SOB, abd pain, CP, N/V/C/D, and vision changes   Objective:   No results found. No results for input(s): WBC, HGB, HCT, PLT in the last 72 hours. No results for input(s): NA, K, CL, CO2, GLUCOSE, BUN, CREATININE, CALCIUM in the last 72 hours.  Intake/Output Summary (Last 24 hours) at 08/03/2020 0851 Last data filed at 08/02/2020 1827 Gross per 24 hour  Intake 440 ml  Output -  Net 440 ml        Physical Exam: Vital Signs Blood pressure 120/65, pulse 88, temperature 97.6 F (36.4 C), resp. rate 18, height 5\' 7"  (1.702 m), weight 81.8 kg, SpO2 93 %.        General: awake, alert, appropriate, sitting up in manual w/c, OT in room, doing grooming, NAD HENT: conjugate gaze; oropharynx moist CV: regular rate and rhythm; no JVD Pulmonary: CTA B/L; no W/R/R- good air movement GI: soft, NT, ND, (+)BS Psychiatric: appropriate- smiling a little more- less sad/depressed Neurological: Ox3; MAS 1- 1+ in LEs Musculoskeletal:     Cervical back: Normal range of motion and neck supple.     Comments: Left knee pain with flexion.  Left knee with tenderness and mild edema Skin:    General: Skin is warm and dry.     Comments: Tinea pedis bilaterally with cracked right heel and thick toe nails.    Swelling much improved in LEs- without Lyrica is trace in feet/ankles Neurological: Ox3 Strong Hoffman's B/L MAS of 1 in LEs- slightly better Motor: Bilateral upper extremities: Shoulder abduction, elbow flexion/extension 4/5, wrist extension 3+/5, and intrinsics 2/5 Right lower extremity: Hip flexion, knee extension 2+/5, ankle dorsiflexion 4/5 no change Left lower extremity: Hip  flexion, knee extension 3 -/5, ankle dorsiflexion 4/5 no change Sensation to light touch diminished distal to elbows and knees    Assessment/Plan: 1. Functional deficits which require 3+ hours per day of interdisciplinary therapy in a comprehensive inpatient rehab setting.  Physiatrist is providing close team supervision and 24 hour management of active medical problems listed below.  Physiatrist and rehab team continue to assess barriers to discharge/monitor patient progress toward functional and medical goals  Care Tool:  Bathing    Body parts bathed by patient: Right arm,Left arm,Chest,Abdomen,Front perineal area,Buttocks,Right upper leg,Left upper leg,Face   Body parts bathed by helper: Right lower leg,Left lower leg Body parts n/a: Left lower leg,Right lower leg   Bathing assist Assist Level: Minimal Assistance - Patient > 75%     Upper Body Dressing/Undressing Upper body dressing   What is the patient wearing?: Pull over shirt    Upper body assist Assist Level: Set up assist    Lower Body Dressing/Undressing Lower body dressing      What is the patient wearing?: Incontinence brief,Underwear/pull up     Lower body assist Assist for lower body dressing: Maximal Assistance - Patient 25 - 49%  Toileting Toileting    Toileting assist Assist for toileting: Moderate Assistance - Patient 50 - 74%     Transfers Chair/bed transfer  Transfers assist     Chair/bed transfer assist level: Minimal Assistance - Patient > 75%     Locomotion Ambulation   Ambulation assist   Ambulation activity did not occur: Safety/medical concerns  Assist level: Minimal Assistance - Patient > 75% Assistive device: Walker-rolling Max distance: 214'   Walk 10 feet activity   Assist  Walk 10 feet activity did not occur: Safety/medical concerns  Assist level: Minimal Assistance - Patient > 75% Assistive device: Walker-rolling   Walk 50 feet activity   Assist Walk 50  feet with 2 turns activity did not occur: Safety/medical concerns  Assist level: Minimal Assistance - Patient > 75% Assistive device: Walker-rolling    Walk 150 feet activity   Assist Walk 150 feet activity did not occur: Safety/medical concerns  Assist level: Minimal Assistance - Patient > 75% Assistive device: Walker-rolling    Walk 10 feet on uneven surface  activity   Assist Walk 10 feet on uneven surfaces activity did not occur: Safety/medical concerns         Wheelchair     Assist Will patient use wheelchair at discharge?: Yes Type of Wheelchair: Manual    Wheelchair assist level: Supervision/Verbal cueing Max wheelchair distance: 120    Wheelchair 50 feet with 2 turns activity    Assist        Assist Level: Minimal Assistance - Patient > 75%   Wheelchair 150 feet activity     Assist      Assist Level: Moderate Assistance - Patient 50 - 74%   Blood pressure 120/65, pulse 88, temperature 97.6 F (36.4 C), resp. rate 18, height 5\' 7"  (1.702 m), weight 81.8 kg, SpO2 93 %.  Medical Problem List and Plan: 1.  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             -patient may shower             -ELOS/Goals: 14 to 17 days/supervision/min a            Continue CIR PT, OT, SLP 2.  Antithrombotics:  -DVT/anticoagulation:  Pharmaceutical: Continue Lovenox  2/17- Very high risk of DVT due to SCI-will need 2-3 months of Lovenox per CHEST guidelines.   3/4- needs 2-3 Months of Lovenox based on distance walking.   3/8- waiting to see what she's walking at d/c to determine length of Lovenox  3/9- moved d/c date to 3/24 due to needing to be mod I to supervision at d/c.              -antiplatelet therapy: N/A 3. Pain Management: Continue Oxycodone prn.  3/3- off Lyrica- on Duloxetine 60 mg QHS- con't regimen  3/8- having N/T of Feet B/L- can increase  Duloxetine to 90 mg QHS since can't do Gabapentin or Lyrica.   3/11- con't nerve pain meds and tylenol prn- will wait since just increased Duloxetine             Monitor with increased exertion, particularly neuropathic pain 4. Mood: LCSW to follow for evaluation and support.   3/3- will get pt Neuropsychology  3/4- d/w Neuropsych to see if they can see again.              -antipsychotic agents: N/A 5. Neuropsych: This patient is capable of  making decisions on her own behalf. 6. Skin/Wound Care:  Monitor incision for healing.  Added protein supplement to help promote healing.  7. Fluids/Electrolytes/Nutrition: Monitor I/Os.              Monitor CMP 8. ABLA:              Monitor CBC 9. Hypokalemia/Hypomagnesemia: Has resolved with brief supplementation.              2/22- K+ 4.0 and last Mg 1.9- con't regimen  2/28: K+ reviewed, normalized to 4.2  3/4- labs again Monday  3/7- K+ 4.0- doing great 10. Macrocytosis/Polysubstance abuse:              Vitamin B12/folate levels ordered.   217- counseling given about polysubstance abuse 11. Neurogenic bowel and constipation: KUB ordered to determine stool burden.  2/17- moderate R sided stool burden with a lot of contrast left in colon- will order bowel program with dig stim nightly and con't Miralax BID for now- she has no control of stool per pt.   2/18- had 5+ BMs last night- feels cleaned out- now- kept saying had more in her- but now feels "great'.   2/26- no suppository last night, since nursing decided with multiple BMs yesterday to only do dig stim- no results, but small BM on toilet this AM. COn't bowel program nightly  3/2- since going on toilet, will stop bowel program  3/3- LBM yesterday - having a lot of gas- d/w pt using fewer straws, but check with SLP first.   3/7- Pt having regular BMs with NO bowel program- going on toilet.  3/9- small BM on toilet this AM- con't PO meds  3/10- has gotten constipated from Baclofen increase- will  add Senokot 1 tab BID to allow her to go better- will try and avoid loose stools  3/11- con't bowel meds- and regimen- might need to increase bowel meds more 12. Neurogenic bladder  2/27- back to being "crazy" per pt- going very frequently- will con't and if no better Tuesday, will increase to 0.8 mg nightly   3/1- will check U/A and Cx- has been a few weeks- just to make sure- if negative, will d/w pt about foley since d/w Urology- they want us to start Vesicare AND Myrbetriq, get her into retention and cath, but she doesn't have good hand function, I'm concerned she won't be able to cath.   3/8- wants to remove Foley and try timed voiding again- will try-  3/8- wants to remove Foley and try timed voiding again- will try-  3/9- continent during day- not at night so far- less irritable bladder, not so frequent. con't regimen    3/10- timed voiding better with continence during day- will con't regimen  3/11- timed voiding keeps her continent during day- not at night 13. Neuropathic pain  2/17- start Lyrica 50 mg TID for nerve pain- hopefully will help "being on fire".   2/18- slightly better today- con't regimen  2/21- Increased Lyrica to 75 mg TID since no side effects of meds? But the arm swelling could be from Lyrica?  3/2- increased yesterday- con't regimen- stopped Lyrica  3/7- Pain better controlled- con't regimen 14. Spasticity  2/17- will start Baclofen 5 mg QID for muscle tightness/spasms.     2/24- spasms well controlled now- con't Baclofen  2/25- spasms controlled, but getting a little tighter- will con't baclofen and con't to monitor  3/7- will increase Baclofen to 15 mg TID for  spasticity- watch for constipation and sedation.  3/9- improved Sx's of tightness of LEs- no side effects so far per pt.  3/11- had constipation- but added Senokot 1 tab BID- spent time discussing cannot fully get of spasticity because  Then would have trouble walking- so will keep on current dose of  spasticity  15. Dry skin: Eucerin ordered, encouraged use.  16. B/L elbow/arm swelling  2/21- refused a medicine last night- could be cause of arm swelling. Will look into this. Held Lyrica last night- and swelling better- will d/w pt- asked her to refuse it today.    2/22- Lyrica decreased to 25 mg TID- swelling better  3/2- d/c Lyrica  3/7- swelling a lot better  3/8- down to trace LE swelling- off Lyrica.  17. Dysphagia  D2 thins diet  2/22- con't regimen until upgraded by SLP.  18. Urinary urgency/frequency  2/22- will try Myrbetriq 25 mg QHS for urinary frequency- will also check PVRs with addition of new meds, so not retaining, hopefully.   2/24- as per Neurogenic bladder  2/26- added Flomax yesterday- working [facility] better- con't Flomax   2/28: continue Flomax.   3/2- will d/c flomax and place foley since constant voiding.   3/3- is dry for first time since SCI- pt happy about this.   3/9- foley removed- con't timed voiding.   3/10- bladder doing better as above.  20. Severe calluses on Feet  2/27- add order that will have feet warmed with warm wet washcloth, then lotion applied, then vaseline over that BID- to help cracks/calluses.    I spent a total of 25 minutes on visit- >50% coordination of care LOS: 23 days A FACE TO FACE EVALUATION WAS PERFORMED  Megan Lovorn 08/03/2020, 8:51 AM

## 2020-08-04 DIAGNOSIS — R1314 Dysphagia, pharyngoesophageal phase: Secondary | ICD-10-CM

## 2020-08-04 DIAGNOSIS — D7589 Other specified diseases of blood and blood-forming organs: Secondary | ICD-10-CM

## 2020-08-04 NOTE — Plan of Care (Signed)
  Problem: SCI BLADDER ELIMINATION Goal: RH STG MANAGE BLADDER WITH ASSISTANCE Description: STG Manage Bladder With Mod I Assistance Outcome: Not Progressing; incontinence   Problem: SCI BOWEL ELIMINATION Goal: RH STG MANAGE BOWEL WITH ASSISTANCE Description: STG Manage Bowel with Mod I Assistance. Outcome: Not Progressing; incontinence at times

## 2020-08-04 NOTE — Progress Notes (Signed)
Occupational Therapy Session Note  Patient Details  Name: Yvette Phillips MRN: [id number] Date of Birth: Apr 30, 1961  Today's Date: 08/04/2020 OT Individual Time: 1001-1059 OT Individual Time Calculation (min): 58 min   Skilled Therapeutic Interventions/Progress Updates:    Pt greeted in bed, premedicated for neuropathic pain in feet. She wanted to start session by using the restroom. Mod A for supine<sit and then CGA-Min A for ambulatory transfer to the toilet using RW. Pt at times stating "whoa" and exhibiting small LOBs. Note pt had audible bowel incontinence while ambulating. Vcs for transfer technique when approaching elevated toilet, CGA for lowering clothing. Pt continued having BM void while seated. Mod A for thorough perihygiene in the front + back due to limited functional hand use bilaterally. Hygiene completed while sitting with Min A for leg spreading and CGA for dynamic standing balance using device. She ambulated to her w/c parked in front of the sink and then participated in bathing/dressing tasks. Pt requiring Total A to functionally reach feet. Min cuing for grooming tasks and setup for oral care while seated, pt provided with opportunities to stand but opting to sit due to fatigue. To continue working on functional hand use, pt donned 4 pillowcases for pillows on her bed. Pt rated perceived exertion as 5/10, required increased time to meet motor demands of this task. Ambulatory transfer completed back to bed with CGA using RW and Mod A to return to bed. Pt remained in bed at close of session, heels floated, all needs within reach and bed alarm set.    Therapy Documentation Precautions:  Precautions Precautions: Fall,Cervical Precaution Comments: s/p ACDF C4-5 (no cervical brace needed per acute), BUE/LE weakness Restrictions Weight Bearing Restrictions: No   ADL: ADL Equipment Provided: Feeding equipment Eating: Not assessed (not fully assessed, does have built up handles and  able to simulate) Grooming: Moderate assistance Upper Body Bathing: Minimal assistance Where Assessed-Upper Body Bathing: Sitting at sink Lower Body Bathing: Maximal assistance Where Assessed-Lower Body Bathing: Sitting at sink,Standing at sink Upper Body Dressing: Moderate assistance Lower Body Dressing: Dependent Where Assessed-Lower Body Dressing: Sitting at sink,Standing at sink Toileting: Not assessed Toilet Transfer: Maximal assistance      Therapy/Group: Individual Therapy  Mahathi Pokorney A Kurstyn Larios 08/04/2020, 12:20 PM

## 2020-08-04 NOTE — Progress Notes (Signed)
PROGRESS NOTE   Subjective/Complaints: Patient seen sitting up in bed this morning.  She states she slept well overnight.  She denies complaints.  ROS: Denies CP, SOB, N/V/D  Objective:   No results found. No results for input(s): WBC, HGB, HCT, PLT in the last 72 hours. No results for input(s): NA, K, CL, CO2, GLUCOSE, BUN, CREATININE, CALCIUM in the last 72 hours.  Intake/Output Summary (Last 24 hours) at 08/04/2020 0944 Last data filed at 08/04/2020 0855 Gross per 24 hour  Intake 476 ml  Output --  Net 476 ml        Physical Exam: Vital Signs Blood pressure 133/69, pulse 86, temperature 98.5 F (36.9 C), temperature source Oral, resp. rate 18, height 5\' 7"  (1.702 m), weight 81.8 kg, SpO2 98 %. Constitutional: No distress . Vital signs reviewed. HENT: Normocephalic.  Atraumatic. Eyes: EOMI. No discharge. Cardiovascular: No JVD.  RRR. Respiratory: Normal effort.  No stridor.  Bilateral clear to auscultation. GI: Non-distended.  BS +. Skin: Warm and dry.  Intact. Psych: Normal mood.  Normal behavior. Musc: No edema in extremities.  No tenderness in extremities. Neuro: Alert Motor: Bilateral upper extremities: 4 -/5 proximal distal Right lower extremity: Hip flexion, knee extension 2+/5, ankle dorsiflexion 4/5 no change Left lower extremity: Hip flexion, knee extension 3 -/5, ankle dorsiflexion 4/5 no change  Assessment/Plan: 1. Functional deficits which require 3+ hours per day of interdisciplinary therapy in a comprehensive inpatient rehab setting.  Physiatrist is providing close team supervision and 24 hour management of active medical problems listed below.  Physiatrist and rehab team continue to assess barriers to discharge/monitor patient progress toward functional and medical goals  Care Tool:  Bathing    Body parts bathed by patient: Right arm,Left arm,Chest,Abdomen,Front perineal area,Buttocks,Right  upper leg,Left upper leg,Face   Body parts bathed by helper: Right lower leg,Left lower leg Body parts n/a: Left lower leg,Right lower leg   Bathing assist Assist Level: Minimal Assistance - Patient > 75%     Upper Body Dressing/Undressing Upper body dressing   What is the patient wearing?: Pull over shirt    Upper body assist Assist Level: Set up assist    Lower Body Dressing/Undressing Lower body dressing      What is the patient wearing?: Incontinence brief,Underwear/pull up     Lower body assist Assist for lower body dressing: Maximal Assistance - Patient 25 - 49%     Toileting Toileting    Toileting assist Assist for toileting: Moderate Assistance - Patient 50 - 74%     Transfers Chair/bed transfer  Transfers assist     Chair/bed transfer assist level: Minimal Assistance - Patient > 75%     Locomotion Ambulation   Ambulation assist   Ambulation activity did not occur: Safety/medical concerns  Assist level: Minimal Assistance - Patient > 75% Assistive device: Walker-rolling Max distance: 214'   Walk 10 feet activity   Assist  Walk 10 feet activity did not occur: Safety/medical concerns  Assist level: Minimal Assistance - Patient > 75% Assistive device: Walker-rolling   Walk 50 feet activity   Assist Walk 50 feet with 2 turns activity did not occur: Safety/medical concerns  Assist  level: Minimal Assistance - Patient > 75% Assistive device: Walker-rolling    Walk 150 feet activity   Assist Walk 150 feet activity did not occur: Safety/medical concerns  Assist level: Minimal Assistance - Patient > 75% Assistive device: Walker-rolling    Walk 10 feet on uneven surface  activity   Assist Walk 10 feet on uneven surfaces activity did not occur: Safety/medical concerns         Wheelchair     Assist Will patient use wheelchair at discharge?: Yes Type of Wheelchair: Manual    Wheelchair assist level: Supervision/Verbal  cueing Max wheelchair distance: 120    Wheelchair 50 feet with 2 turns activity    Assist        Assist Level: Minimal Assistance - Patient > 75%   Wheelchair 150 feet activity     Assist      Assist Level: Moderate Assistance - Patient 50 - 74%   Blood pressure 133/69, pulse 86, temperature 98.5 F (36.9 C), temperature source Oral, resp. rate 18, height 5\' 7"  (1.702 m), weight 81.8 kg, SpO2 98 %.  Medical Problem List and Plan: 1.  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  Continue CIR 2.  Antithrombotics:  -DVT/anticoagulation:  Pharmaceutical: Continue Lovenox  2/17- Very high risk of DVT due to SCI-will need 2-3 months of Lovenox per CHEST guidelines.              -antiplatelet therapy: N/A 3. Pain Management: Continue Oxycodone prn.  Off Lyrica- on Duloxetine 60 mg QHS, increase to duloxetine to 90 mg QHS since can't do Gabapentin or Lyrica.   Appears to be improving on 3/12             Monitor with increased exertion, particularly neuropathic pain 4. Mood: LCSW to follow for evaluation and support.   Appreciate neuropsych follow-up             -antipsychotic agents: N/A 5. Neuropsych: This patient is capable of making decisions on her own behalf. 6. Skin/Wound Care:  Monitor incision for healing.  Added protein supplement to help promote healing.  7. Fluids/Electrolytes/Nutrition: Monitor I/Os.  8. ABLA/macrocytosis:   Consider vitamin B12 with next set of labs 9. Hypokalemia/Hypomagnesemia: Has resolved with brief supplementation.              potassium 4.0 on 3/7  10. Polysubstance abuse:              Vitamin B12/folate levels ordered.   Continue to counsel  11. Neurogenic bowel and constipation: KUB ordered to determine stool burden.  Bowel program with dig stim nightly   Added Senokot 1 tab BID to allow her to go better-  Bowels improving, however  many day increase meds 12. Neurogenic bladder  Improved with time voiding, incontinent at times 13. Neuropathic pain  Lyrica iIncreased Lyrica to 75 mg TID, then DC'd due to edema  Controlled on 3/12 14. Spasticity  Increased baclofen to 15 mg TID for spasticity- watch for constipation and sedation. 15. Dry skin: Eucerin ordered, encouraged use.  16. B/L elbow/arm swelling  Improved off of Lyrica 17. Dysphagia  D 3 thins, continue to advance as tolerated 18. Urinary urgency/frequency  Trial Myrbetriq 25 mg QHS for urinary frequency  Added Flomax   See #13 20. Severe calluses on Feet  2/27- add order that will have feet warmed with warm wet washcloth, then lotion applied, then vaseline over that BID-  to help cracks/calluses.    LOS: 24 days A FACE TO FACE EVALUATION WAS PERFORMED  Yvette Phillips Karis Juba 08/04/2020, 9:44 AM

## 2020-08-05 DIAGNOSIS — Z981 Arthrodesis status: Secondary | ICD-10-CM

## 2020-08-05 MED ORDER — SENNOSIDES-DOCUSATE SODIUM 8.6-50 MG PO TABS
2.0000 | ORAL_TABLET | Freq: Every day | ORAL | Status: DC
  Administered 2020-08-05 – 2020-08-20 (×16): 2 via ORAL
  Filled 2020-08-05 (×16): qty 2

## 2020-08-05 MED ORDER — DARIFENACIN HYDROBROMIDE ER 7.5 MG PO TB24
7.5000 mg | ORAL_TABLET | Freq: Every day | ORAL | Status: DC
  Administered 2020-08-05 – 2020-08-21 (×17): 7.5 mg via ORAL
  Filled 2020-08-05 (×17): qty 1

## 2020-08-05 NOTE — Progress Notes (Signed)
PROGRESS NOTE   Subjective/Complaints: Patient seen sitting up in bed this morning.  She states she slept well overnight.  She is eating breakfast.  ROS: Denies CP, SOB, N/V/D  Objective:   No results found. No results for input(s): WBC, HGB, HCT, PLT in the last 72 hours. No results for input(s): NA, K, CL, CO2, GLUCOSE, BUN, CREATININE, CALCIUM in the last 72 hours.  Intake/Output Summary (Last 24 hours) at 08/05/2020 0738 Last data filed at 08/04/2020 1700 Gross per 24 hour  Intake 626 ml  Output --  Net 626 ml        Physical Exam: Vital Signs Blood pressure 134/81, pulse 87, temperature 98.1 F (36.7 C), resp. rate 18, height 5\' 7"  (1.702 m), weight 81.8 kg, SpO2 98 %. Constitutional: No distress . Vital signs reviewed. HENT: Normocephalic.  Atraumatic. Eyes: EOMI. No discharge. Cardiovascular: No JVD.  RRR. Respiratory: Normal effort.  No stridor.  Bilateral clear to auscultation. GI: Non-distended.  BS +. Skin: Warm and dry.  Intact. Psych: Normal mood.  Normal behavior. Musc: No edema in extremities.  No tenderness in extremities. Neuro: Alert Motor: Bilateral upper extremities: 4 -/5 proximal distal, unchanged Right lower extremity: Hip flexion, knee extension 2+/5, ankle dorsiflexion 4/5, stable Left lower extremity: Hip flexion, knee extension 3 -/5, ankle dorsiflexion 4/5, stable  Assessment/Plan: 1. Functional deficits which require 3+ hours per day of interdisciplinary therapy in a comprehensive inpatient rehab setting.  Physiatrist is providing close team supervision and 24 hour management of active medical problems listed below.  Physiatrist and rehab team continue to assess barriers to discharge/monitor patient progress toward functional and medical goals  Care Tool:  Bathing    Body parts bathed by patient: Right arm,Left arm,Chest,Abdomen,Front perineal area,Buttocks,Right upper leg,Left  upper leg,Face   Body parts bathed by helper: Right lower leg,Left lower leg Body parts n/a: Left lower leg,Right lower leg   Bathing assist Assist Level: Minimal Assistance - Patient > 75%     Upper Body Dressing/Undressing Upper body dressing   What is the patient wearing?: Pull over shirt    Upper body assist Assist Level: Set up assist    Lower Body Dressing/Undressing Lower body dressing      What is the patient wearing?: Incontinence brief,Underwear/pull up     Lower body assist Assist for lower body dressing: Maximal Assistance - Patient 25 - 49%     Toileting Toileting    Toileting assist Assist for toileting: Moderate Assistance - Patient 50 - 74%     Transfers Chair/bed transfer  Transfers assist     Chair/bed transfer assist level: Minimal Assistance - Patient > 75%     Locomotion Ambulation   Ambulation assist   Ambulation activity did not occur: Safety/medical concerns  Assist level: Minimal Assistance - Patient > 75% Assistive device: Walker-rolling Max distance: 214'   Walk 10 feet activity   Assist  Walk 10 feet activity did not occur: Safety/medical concerns  Assist level: Minimal Assistance - Patient > 75% Assistive device: Walker-rolling   Walk 50 feet activity   Assist Walk 50 feet with 2 turns activity did not occur: Safety/medical concerns  Assist level: Minimal Assistance -  Patient > 75% Assistive device: Walker-rolling    Walk 150 feet activity   Assist Walk 150 feet activity did not occur: Safety/medical concerns  Assist level: Minimal Assistance - Patient > 75% Assistive device: Walker-rolling    Walk 10 feet on uneven surface  activity   Assist Walk 10 feet on uneven surfaces activity did not occur: Safety/medical concerns         Wheelchair     Assist Will patient use wheelchair at discharge?: Yes Type of Wheelchair: Manual    Wheelchair assist level: Supervision/Verbal cueing Max wheelchair  distance: 120    Wheelchair 50 feet with 2 turns activity    Assist        Assist Level: Minimal Assistance - Patient > 75%   Wheelchair 150 feet activity     Assist      Assist Level: Moderate Assistance - Patient 50 - 74%   Blood pressure 134/81, pulse 87, temperature 98.1 F (36.7 C), resp. rate 18, height 5\' 7"  (1.702 m), weight 81.8 kg, SpO2 98 %.  Medical Problem List and Plan: 1.  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  Continue CIR 2.  Antithrombotics:  -DVT/anticoagulation:  Pharmaceutical: Continue Lovenox  2/17- Very high risk of DVT due to SCI-will need 2-3 months of Lovenox per CHEST guidelines.              -antiplatelet therapy: N/A 3. Pain Management: Continue Oxycodone prn.  Off Lyrica- on Duloxetine 60 mg QHS, increase to duloxetine to 90 mg QHS since can't do Gabapentin or Lyrica.   Controlled with meds on 3/13             Monitor with increased exertion, particularly neuropathic pain 4. Mood: LCSW to follow for evaluation and support.   Appreciate neuropsych follow-up             -antipsychotic agents: N/A 5. Neuropsych: This patient is capable of making decisions on her own behalf. 6. Skin/Wound Care:  Monitor incision for healing.  Added protein supplement to help promote healing.  7. Fluids/Electrolytes/Nutrition: Monitor I/Os.  8. ABLA/macrocytosis:   Vitamin B12 ordered for tomorrow 9. Hypokalemia/Hypomagnesemia: Has resolved with brief supplementation.              potassium 4.0 on 3/7  10. Polysubstance abuse:              Vitamin B12/folate levels ordered.   Continue to counsel  11. Neurogenic bowel and constipation: KUB ordered to determine stool burden.  Bowel program with dig stim nightly   Added Senokot 1 tab, changed to senna S2 tabs nightly on 3/13 12. Neurogenic bladder  Some improvement with timed voiding, however remains  incontinent  See #18 13. Neuropathic pain  Lyrica iIncreased Lyrica to 75 mg TID, then DC'd due to edema  Controlled on 3/13 14. Spasticity  Increased baclofen to 15 mg TID for spasticity- watch for constipation and sedation. 15. Dry skin: Eucerin ordered, encouraged use.  16. B/L elbow/arm swelling  Improved off of Lyrica 17. Dysphagia  D 3 thins, continue to advance as tolerated 18. Urinary urgency/frequency  Trial Myrbetriq 25 mg QHS for urinary frequency,?  DC'd on 2/25  Added Flomax,?  DC'd on 3/2  Trial Enablex on 3/13  See #13 20. Severe calluses on Feet  2/27- feet warmed with warm wet washcloth, then lotion applied, then vaseline over that BID- to help cracks/calluses.    LOS:  25 days A FACE TO FACE EVALUATION WAS PERFORMED  Yvette Phillips Karis Juba 08/05/2020, 7:38 AM

## 2020-08-05 NOTE — Progress Notes (Signed)
Physical Therapy Session Note  Patient Details  Name: Yvette Phillips MRN: [id number] Date of Birth: [birth year]  Today's Date: 08/05/2020 PT Individual Time: 9678-9381, 0175-1025 PT Individual Time Calculation (min): 62 min, 45 min  Short Term Goals: Week 4:  PT Short Term Goal 1 (Week 4): Pt will perform bed mobility with min A consistently PT Short Term Goal 2 (Week 4): Pt ascend/descend 4 x 6" stairs with max A PT Short Term Goal 3 (Week 4): Pt will perform least restrictive transfer with CGA consistently  Skilled Therapeutic Interventions/Progress Updates:    Session 1: Pt seated in recliner on arrival, agreeable to therapy. Pt requests to use bathroom. Pt ambulates to bathroom with min A and RW, required mod A to perform 3/3 toileting tasks for balance and posterior hygiene due to decreased hand function. Pt propelled wc x 190 ft, x 100 ft with BUE and supervision, demonstrating improved propulsion and navigation technique. Pt performed STS x 10 with focus on eccentric phase in preparation for stair navigation. Pt performed step ups to 3" stair with B handrails and mod A for balance. Step up to SLS on 3" step, 2 x 10 with mod A for balance and tapping facilitation/blocking at knee to prevent extensor thrust. Side steps with RW and min A x 20 ft BIL. Pt returned to recliner after session and was left with all needs in reach and chair alarm active.   Session 2: Pt seated in recliner and agreeable to therapy. Stand pivot EOB<> wc with RW, CGA. STS performed with RW and CGA to min A with fatigue through out session. Gait 3 x 100 ft with CGA and RW. Pt demonstrates poor foot clearance and decreased ankle DF during gait, as well as extensor thrust in stance. Pt participates in throwing horseshoes x 12, x 18,  with CGA and RW, with no UE support at times, utilizing a wide BOS for balance.Pt returned to room after session and returned to bed with mod A for LE management. Pt left with bed alarm active and  all needs in reach.  Therapy Documentation Precautions:  Precautions Precautions: Fall,Cervical Precaution Comments: s/p ACDF C4-5 (no cervical brace needed per acute), BUE/LE weakness Restrictions Weight Bearing Restrictions: No General:    Therapy/Group: Individual Therapy  Dyane Dustman, SPT 08/05/2020, 12:07 PM

## 2020-08-05 NOTE — Progress Notes (Signed)
Occupational Therapy Session Note  Patient Details  Name: Yvette Phillips MRN: [id number] Date of Birth: Sep 15, 1960  Today's Date: 08/05/2020 OT Individual Time: 0815-0900 OT Individual Time Calculation (min): 45 min    Short Term Goals: Week 4:  OT Short Term Goal 1 (Week 4): STG=LTG 2/2 ELOS  Skilled Therapeutic Interventions/Progress Updates:    Pt resting in bed upon arrival and requesting to use bathroom. Supine>sit EOB with CGA. W/c used for transfer to bathroom 2/2 urgency. Toilet transfers with CGA. Mod A for toileting. See Care Tool for bathing/dressing assist levels. Pt able to thread BLE into pants with min A this morning. Pt intiates pulling pants over hips but requires assitance to complete task. Sit<>stand and standing balance with CGA. Pt amb with RW in room to recliner. Pt remained in recliner with all needs within reach and seat alarm activated.   Therapy Documentation Precautions:  Precautions Precautions: Fall,Cervical Precaution Comments: s/p ACDF C4-5 (no cervical brace needed per acute), BUE/LE weakness Restrictions Weight Bearing Restrictions: No    Pain: Pt denies pain this morning.  Therapy/Group: Individual Therapy  [name] 08/05/2020, 9:05 AM

## 2020-08-06 LAB — BASIC METABOLIC PANEL
Anion gap: 8 (ref 5–15)
BUN: 17 mg/dL (ref 8–23)
CO2: 27 mmol/L (ref 22–32)
Calcium: 9.7 mg/dL (ref 8.9–10.3)
Chloride: 102 mmol/L (ref 98–111)
Creatinine, Ser: 0.68 mg/dL (ref 0.44–1.00)
GFR, Estimated: >60 mL/min (ref >60)
Glucose, Bld: 87 mg/dL (ref 70–99)
Potassium: 4 mmol/L (ref 3.5–5.1)
Sodium: 137 mmol/L (ref 135–145)

## 2020-08-06 LAB — CBC
HCT: 37.9 % (ref 36.0–46.0)
Hemoglobin: 12.7 g/dL (ref 12.0–15.0)
MCH: 34.4 pg — ABNORMAL HIGH (ref 26.0–34.0)
MCHC: 33.5 g/dL (ref 30.0–36.0)
MCV: 102.7 fL — ABNORMAL HIGH (ref 80.0–100.0)
Platelets: 341 10*3/uL (ref 150–400)
RBC: 3.69 MIL/uL — ABNORMAL LOW (ref 3.87–5.11)
RDW: 12.7 % (ref 11.5–15.5)
WBC: 7.7 10*3/uL (ref 4.0–10.5)
nRBC: 0 % (ref 0.0–0.2)

## 2020-08-06 LAB — VITAMIN B12: Vitamin B-12: 173 pg/mL — ABNORMAL LOW (ref 180–914)

## 2020-08-06 NOTE — Progress Notes (Signed)
Physical Therapy Session Note  Patient Details  Name: Yvette Phillips MRN: [id number] Date of Birth: [birth year]  Today's Date: 08/06/2020 PT Individual Time: 0900-1000, 2130-8657 PT Individual Time Calculation (min): 60 min, 60 min  Short Term Goals: Week 4:  PT Short Term Goal 1 (Week 4): Pt will perform bed mobility with min A consistently PT Short Term Goal 2 (Week 4): Pt ascend/descend 4 x 6" stairs with max A PT Short Term Goal 3 (Week 4): Pt will perform least restrictive transfer with CGA consistently  Skilled Therapeutic Interventions/Progress Updates:    Session 1: Pt seated in recliner on arrival and agreeable to therapy. Reported 5/10 knee pain, nursing administered medication during session. Pt performed STS and stand pivot transfer with min A and RW throughout session. Pt demonstrated downcast affect through out session and required more assist to stand than previous sessions. Pt was transported to gym for time management. STS 3 x 5 and LAQ 3 x 10 while nursing prepared to deliver meds. Session focused on trialing brace options to improve BIL foot drop and extensor thrust. Best outcomes were observed with BIL foot up brace and R heel wedge. Pt returned to recliner after session with chair alarm active and all needs in reach.  Session 2:  Pt seated in recliner on arrival and agreeable to therapy. Performed STS and stand pivot transfers with RW and min A. Gait x 100 ft, 2 x 50 ft  with RW and min A in outdoor environment with BIL foot up braces and R heel wedge, navigating small inclines and un level surfaces. Pt had one episode of knee buckling but recovered with min A. Pt performed toe taps on 3" step 4 x 10 with seated rest breaks. Required mod A to stand from low bench. Pt performed 2 bouts of ball tossing x 3 min with no UE support at times. Demonstrated small LOBs but was able to recover with RW and min A. Pt returned to bed after session with mod A and was left with bed alarm active  and all needs in reach.   Therapy Documentation Precautions:  Precautions Precautions: Fall,Cervical Precaution Comments: s/p ACDF C4-5 (no cervical brace needed per acute), BUE/LE weakness Restrictions Weight Bearing Restrictions: No    Therapy/Group: Individual Therapy  Dyane Dustman, SPT 08/06/2020, 3:24 PM

## 2020-08-06 NOTE — Progress Notes (Signed)
Occupational Therapy Session Note  Patient Details  Name: Yvette Phillips MRN: [id number] Date of Birth: July 17, 1960  Today's Date: 08/06/2020 OT Individual Time: 4627-0350 OT Individual Time Calculation (min): 58 min    Short Term Goals: Week 4:  OT Short Term Goal 1 (Week 4): STG=LTG 2/2 ELOS  Skilled Therapeutic Interventions/Progress Updates:    Pt eating breakfast in bed upon arrival. Pt requested use of bathroom. Min A for sidelying to EOB. Sit<>stand and amb with RW to bathroom with CGA. Toileting with mod A. Pt returned to w/c at sink for BADLs. UB bathing/dressing with supervision. LB bathing with mod A and LB dressing with mod A. Pt able to thread one LE into pants and pull pants up to knees. Pt initiated pulling pants over hips but required assistance to complete task. All sit<>stand with CGA and standing balance with CGA. Pt amb with RW to recliner and remained in recliner. All needs within reach and seat alarm activated.   Therapy Documentation Precautions:  Precautions Precautions: Fall,Cervical Precaution Comments: s/p ACDF C4-5 (no cervical brace needed per acute), BUE/LE weakness Restrictions Weight Bearing Restrictions: No Pain:  Pt denies pain this morning     Therapy/Group: Individual Therapy  [name] 08/06/2020, 8:03 AM

## 2020-08-06 NOTE — Progress Notes (Signed)
PROGRESS NOTE   Subjective/Complaints:  Doing well- LBM this AM and last night.  Able to still void on toilet during the day- but doing timed voiding.    ROS:  Pt denies SOB, abd pain, CP, N/V/C/D, and vision changes   Objective:   No results found. Recent Labs    08/06/20 0600  WBC 7.7  HGB 12.7  HCT 37.9  PLT 341   Recent Labs    08/06/20 0600  NA 137  K 4.0  CL 102  CO2 27  GLUCOSE 87  BUN 17  CREATININE 0.68  CALCIUM 9.7    Intake/Output Summary (Last 24 hours) at 08/06/2020 0838 Last data filed at 08/06/2020 0742 Gross per 24 hour  Intake 940 ml  Output -  Net 940 ml        Physical Exam: Vital Signs Blood pressure 133/74, pulse 82, temperature 98.3 F (36.8 C), temperature source Oral, resp. rate 18, height 5\' 7"  (1.702 m), weight 81.8 kg, SpO2 96 %.  General: awake, alert, appropriate, standing up at sink with OT and OT there for support,  NAD HENT: conjugate gaze; oropharynx moist CV: regular rate and rhythm; no JVD Pulmonary: CTA B/L; no W/R/R- good air movement GI: soft, NT, ND, (+)BS Psychiatric: appropriate Neurological: Ox3 Skin: Warm and dry.  Intact. No skin breakdown on backside.  Musc: No edema in extremities.  No tenderness in extremities. Motor: Bilateral upper extremities: 4 -/5 proximal distal, unchanged Right lower extremity: Hip flexion, knee extension 2+/5, ankle dorsiflexion 4/5, stable Left lower extremity: Hip flexion, knee extension 3 -/5, ankle dorsiflexion 4/5, stable  Assessment/Plan: 1. Functional deficits which require 3+ hours per day of interdisciplinary therapy in a comprehensive inpatient rehab setting.  Physiatrist is providing close team supervision and 24 hour management of active medical problems listed below.  Physiatrist and rehab team continue to assess barriers to discharge/monitor patient progress toward functional and medical goals  Care  Tool:  Bathing    Body parts bathed by patient: Right arm,Left arm,Chest,Abdomen,Front perineal area,Buttocks,Right upper leg,Left upper leg,Face   Body parts bathed by helper: Right lower leg,Left lower leg Body parts n/a: Left lower leg,Right lower leg   Bathing assist Assist Level: Minimal Assistance - Patient > 75%     Upper Body Dressing/Undressing Upper body dressing   What is the patient wearing?: Pull over shirt    Upper body assist Assist Level: Set up assist    Lower Body Dressing/Undressing Lower body dressing      What is the patient wearing?: Incontinence brief,Underwear/pull up     Lower body assist Assist for lower body dressing: Moderate Assistance - Patient 50 - 74%     Toileting Toileting    Toileting assist Assist for toileting: Moderate Assistance - Patient 50 - 74%     Transfers Chair/bed transfer  Transfers assist     Chair/bed transfer assist level: Minimal Assistance - Patient > 75%     Locomotion Ambulation   Ambulation assist   Ambulation activity did not occur: Safety/medical concerns  Assist level: Minimal Assistance - Patient > 75% Assistive device: Walker-rolling Max distance: 20   Walk 10 feet activity  Assist  Walk 10 feet activity did not occur: Safety/medical concerns  Assist level: Minimal Assistance - Patient > 75% Assistive device: Walker-rolling   Walk 50 feet activity   Assist Walk 50 feet with 2 turns activity did not occur: Safety/medical concerns  Assist level: Minimal Assistance - Patient > 75% Assistive device: Walker-rolling    Walk 150 feet activity   Assist Walk 150 feet activity did not occur: Safety/medical concerns  Assist level: Minimal Assistance - Patient > 75% Assistive device: Walker-rolling    Walk 10 feet on uneven surface  activity   Assist Walk 10 feet on uneven surfaces activity did not occur: Safety/medical concerns         Wheelchair     Assist Will patient  use wheelchair at discharge?: Yes Type of Wheelchair: Manual    Wheelchair assist level: Supervision/Verbal cueing Max wheelchair distance: 190    Wheelchair 50 feet with 2 turns activity    Assist        Assist Level: Minimal Assistance - Patient > 75%   Wheelchair 150 feet activity     Assist      Assist Level: Minimal Assistance - Patient > 75%   Blood pressure 133/74, pulse 82, temperature 98.3 F (36.8 C), temperature source Oral, resp. rate 18, height 5\' 7"  (1.702 m), weight 81.8 kg, SpO2 96 %.  Medical Problem List and Plan: 1.  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  Continue CIR 2.  Antithrombotics:  -DVT/anticoagulation:  Pharmaceutical: Continue Lovenox  2/17- Very high risk of DVT due to SCI-will need 2-3 months of Lovenox per CHEST guidelines.              -antiplatelet therapy: N/A 3. Pain Management: Continue Oxycodone prn.  Off Lyrica- on Duloxetine 60 mg QHS, increase to duloxetine to 90 mg QHS since can't do Gabapentin or Lyrica.   Controlled with meds on 3/13             Monitor with increased exertion, particularly neuropathic pain 4. Mood: LCSW to follow for evaluation and support.   Appreciate neuropsych follow-up             -antipsychotic agents: N/A 5. Neuropsych: This patient is capable of making decisions on her own behalf. 6. Skin/Wound Care:  Monitor incision for healing.  Added protein supplement to help promote healing.  7. Fluids/Electrolytes/Nutrition: Monitor I/Os.  8. ABLA/macrocytosis:   Vitamin B12 ordered for tomorrow 9. Hypokalemia/Hypomagnesemia: Has resolved with brief supplementation.              potassium 4.0 on 3/7  10. Polysubstance abuse:              Vitamin B12/folate levels ordered.   Continue to counsel  11. Neurogenic bowel and constipation: KUB ordered to determine stool burden.  Bowel program with dig  stim nightly   Added Senokot 1 tab, changed to senna S2 tabs nightly on 3/13 12. Neurogenic bladder  Some improvement with timed voiding, however remains incontinent  3/14- says is dry during the day- but not at night- per pt.   See #18 13. Neuropathic pain  Lyrica iIncreased Lyrica to 75 mg TID, then DC'd due to edema  Controlled on 3/13 14. Spasticity  Increased baclofen to 15 mg TID for spasticity- watch for constipation and sedation. 15. Dry skin: Eucerin ordered, encouraged use.  16. B/L elbow/arm swelling  Improved off of Lyrica 17. Dysphagia  D 3 thins, continue to advance as tolerated 18. Urinary urgency/frequency  Trial Myrbetriq 25 mg QHS for urinary frequency,?  DC'd on 2/25  Added Flomax,?  DC'd on 3/2  Trial Enablex on 3/13  3/14- no change so far, but is normal to take a few days  See #13 20. Severe calluses on Feet  2/27- feet warmed with warm wet washcloth, then lotion applied, then vaseline over that BID- to help cracks/calluses.    LOS: 26 days A FACE TO FACE EVALUATION WAS PERFORMED  Megan Lovorn 08/06/2020, 8:38 AM

## 2020-08-07 NOTE — Progress Notes (Signed)
Occupational Therapy Session Note  Patient Details  Name: Kyran Whittier MRN: [id number] Date of Birth: 1960-06-27  Today's Date: 08/07/2020 OT Individual Time: 1130-1155 OT Individual Time Calculation (min): 25 min    Short Term Goals: Week 5:  OT Short Term Goal 1 (Week 5): STG=LTG 2/2 ELOS  Skilled Therapeutic Interventions/Progress Updates:    Pt resting in recliner upon arrival. OT intervention with focus on standing balance and pulling pants over hips.  CGA/supervision for standing balance while practicing pulling pants up and over hips. Pt able to complete task with CGA for standing balance. Discussed plan to address threading pants over feet and toilet hygiene. Pt in agreement on plan. Pt remained in recliner with all needs within reach and seat alarm activated.   Therapy Documentation Precautions:  Precautions Precautions: Fall,Cervical Precaution Comments: s/p ACDF C4-5 (no cervical brace needed per acute), BUE/LE weakness Restrictions Weight Bearing Restrictions: No   Pain:  Pt denies pain this morning   Therapy/Group: Individual Therapy  [name] 08/07/2020, 12:03 PM

## 2020-08-07 NOTE — Progress Notes (Signed)
Occupational Therapy Session Note  Patient Details  Name: Yvette Phillips MRN: [id number] Date of Birth: 1960/11/21  Today's Date: 08/07/2020 OT Individual Time: 0762-2633 OT Individual Time Calculation (min): 31 min    Short Term Goals: Week 4:  OT Short Term Goal 1 (Week 4): STG=LTG 2/2 ELOS OT Short Term Goal 1 - Progress (Week 4): Progressing toward goal Week 5:  OT Short Term Goal 1 (Week 5): STG=LTG 2/2 ELOS  Skilled Therapeutic Interventions/Progress Updates:    pt greeted at time of session on commode, NT present. After toileting, Stedy transfer > recliner for time management. At this time RN entered and performed med pass. After this was finished, pt performed dynamic standing tasks 1x15 chest press, bicep curl, overhead press w/ 2# dowel with Min A for stanidng balance at times. Wanting to lay down, stand pivot recliner > bed CGA with extended time and assist to doff pants and shoes for comfort. Sit > supine Mod A and alarm on call bell in reach.   Therapy Documentation Precautions:  Precautions Precautions: Fall,Cervical Precaution Comments: s/p ACDF C4-5 (no cervical brace needed per acute), BUE/LE weakness Restrictions Weight Bearing Restrictions: No     Therapy/Group: Individual Therapy  Erasmo Score 08/07/2020, 7:27 AM

## 2020-08-07 NOTE — Plan of Care (Signed)
Progressing

## 2020-08-07 NOTE — Progress Notes (Signed)
Occupational Therapy Session Note  Patient Details  Name: Yvette Phillips MRN: [id number] Date of Birth: [birth year]  Today's Date: 08/07/2020 OT Individual Time: 8003-4917 OT Individual Time Calculation (min): 31 min    Short Term Goals: Week 4:  OT Short Term Goal 1 (Week 4): STG=LTG 2/2 ELOS OT Short Term Goal 1 - Progress (Week 4): Progressing toward goal  Skilled Therapeutic Interventions/Progress Updates:    Pt received in recliner, agreeable to therapy. Total A to don B shoes/AFOs. Max A to don w/c gloves after pt allowed increased time to attempt, needed A to orient R/L and individual digits. Light min A for stand-pivot with RW. Pt self-propelled w/c to and from gym ~ 50% of the way with distant S. Stood at [employer] to target B FMC, BUE/BLE strength, and standing activity tolerance with close S. Completed 2 min of single target with 64.65% accuracy. Able to hit all targets with isolated index finger but noted B shaking. SP back to recliner same manner as before.   Pt left in recliner with chair alarm engaged, call bell in reach, and all immediate needs met.    Therapy Documentation Precautions:  Precautions Precautions: Fall,Cervical Precaution Comments: s/p ACDF C4-5 (no cervical brace needed per acute), BUE/LE weakness Restrictions Weight Bearing Restrictions: No Pain: Denies pain   ADL: See Care Tool for more details.   Therapy/Group: Individual Therapy  Volanda Napoleon MS, OTR/L  08/07/2020, 12:40 PM

## 2020-08-07 NOTE — Patient Care Conference (Signed)
Inpatient RehabilitationTeam Conference and Plan of Care Update Date: 08/07/2020   Time: 11:21 AM    Patient Name: Yvette Phillips      Medical Record Number: [id number]  Date of Birth: [birth year] Sex: Female         Room/Bed: 4M10C/4M10C-01 Payor Info: Payor: MEDICAID PENDING / Plan: MEDICAID PENDING / Product Type: *No Product type* /    Admit Date/Time:  07/11/2020  1:08 PM  Primary Diagnosis:  Central cord syndrome at C4 level of cervical spinal cord, subsequent encounter [facility])  Hospital Problems: Principal Problem:   Central cord syndrome at C4 level of cervical spinal cord, subsequent encounter [facility]) Active Problems:   Alcohol abuse   Dysphagia, pharyngoesophageal phase   S/P cervical spinal fusion   Acute blood loss anemia   Macrocytosis   Neurogenic bladder   Neurogenic bowel    Expected Discharge Date: Expected Discharge Date: 08/16/20  Team Members Present: Physician leading conference: Dr. Genice Rouge Care Coodinator Present: Cecile Sheerer, LCSWA;Aiesha Leland Marlyne Beards, RN, BSN, CRRN Nurse Present: Other (comment) Belva Agee, RN) PT Present: Peter Congo, PT OT Present: Ardis Rowan, COTA;Jennifer Katrinka Blazing, OT SLP Present: Colin Benton, SLP PPS Coordinator present : Fae Pippin, SLP     Current Status/Progress Goal Weekly Team Focus  Bowel/Bladder   Pt. is continent B/B with episodes of incontinent at times. LBM 07/27/20  Pt. will maintain normal B/B pattern.  Toilet patient as needed on every shift.   Swallow/Nutrition/ Hydration             ADL's   UB bathing/dressing-supervision; LB dressing-mod A; toileting-mod A; sit<>stand and functional transfers with CGA; increased BUE/hand strength  min A/CGA overall  LB dressing/toileting, standing balance, functional transfers, BUE strengthening, safety awareness   Mobility   Supervision-min A bed mobiltiy, min A-CGA stand pivot transfers and gait with RW, wc mobilty with supervision, intitated stair training  min A  overall, short distance gait  Gait, transfers, balance LE NMR, sit>supine   Communication             Safety/Cognition/ Behavioral Observations            Pain   Pt. pain is well managed with current pain meication.  Notify MD if pain is not relieved by current pain medication.  Assess patient for pain on every shift and as needed and administer PRN as ordered   Skin   Pt. has MASD on right buttocks, crak feet with calluses being treated with barrier cream and lotion /vaseline respectively  Pt. will maintain skin intergrity with no further breakdown.  Assess pt. for skin breakdown on every shift and PRN.     Discharge Planning:  Pt is uninsured. Pt now unable to return to previous residence. Pt able to d/c to her sister's home if needed. Fam edu to be on Wed 3/16 8am-12pm; will begin LOG SNF process in the event pt family is unable to provide care.   Team Discussion: Still having some incontinent episodes, calluses on feet, fungus to toes, heels cracked, still has nerve pain and spasticity. Continent/incontinent B/B, complains of pain and Tylenol given, lotion applied to cracked heels. Not sure of discharge as of yet, patient able to go to sister's if needed or will begin SNF search if family is unable to provide care. Patient on target to meet rehab goals: Patient has min assist goals and is walking longer distances, W/C mobility not great. OT has been focusing on lower body dressing and toileting. Hand strength is improving  but it is a slow process. MD suggest trying E-Stem. PT requesting order for bilateral AFO/foot up brace consult. Not sure which on would be best but will let consult provided make that suggestion.  *See Care Plan and progress notes for long and short-term goals.   Revisions to Treatment Plan:  MD suggests E-Stem for hands.  Teaching Needs: Family education, medication management, pain management, skin/wound care, bowel/bladder management, WC management, transfer  training, gait training, stair training, balance training, endurance training, weight bearing precautions, safety awareness.  Current Barriers to Discharge: Inaccessible home environment, Decreased caregiver support, Home enviroment access/layout, Incontinence, Neurogenic bowel and bladder, Wound care, Lack of/limited family support, Insurance for SNF coverage, Weight bearing restrictions, Medication compliance and Behavior  Possible Resolutions to Barriers: Continue current medications, provide emotional support.     Medical Summary Current Status: on oxy prn- usually takes tylenol lately;  bowel/bladder-   continent and incontinent- >50% continent; calluses on feet/fungus toes/heels cracked; stillhas nerve pain and spasticity- on Baclofen 15 mg TID  Barriers to Discharge: Decreased family/caregiver support;Home enviroment access/layout;Incontinence;Medical stability;Weight  Barriers to Discharge Comments: uninsured- family is iffy- no other options; they don't want to take pt; working on strengthening/ADLs Possible Resolutions to [employer]: goals min A; walking 50 ft x1; needs B/L AFOs- working on getting them; tries hard; hand strength slightly better- biggest barrier; estim?  08/16/20 d/c date   Continued Need for Acute Rehabilitation Level of Care: The patient requires daily medical management by a physician with specialized training in physical medicine and rehabilitation for the following reasons: Direction of a multidisciplinary physical rehabilitation program to maximize functional independence : Yes Medical management of patient stability for increased activity during participation in an intensive rehabilitation regime.: Yes Analysis of laboratory values and/or radiology reports with any subsequent need for medication adjustment and/or medical intervention. : Yes   I attest that I was present, lead the team conference, and concur with the assessment and plan of the  team.   Tennis Must 08/07/2020, 5:31 PM

## 2020-08-07 NOTE — Progress Notes (Signed)
PROGRESS NOTE   Subjective/Complaints:  LBM this AM Voiding a little less frequently- doing slightly better.  Still having some spasms, but not bad.  ADL this AM- I cannot help pt get dressed.    ROS:   Pt denies SOB, abd pain, CP, N/V/C/D, and vision changes   Objective:   No results found. Recent Labs    08/06/20 0600  WBC 7.7  HGB 12.7  HCT 37.9  PLT 341   Recent Labs    08/06/20 0600  NA 137  K 4.0  CL 102  CO2 27  GLUCOSE 87  BUN 17  CREATININE 0.68  CALCIUM 9.7    Intake/Output Summary (Last 24 hours) at 08/07/2020 0847 Last data filed at 08/07/2020 9381 Gross per 24 hour  Intake 560 ml  Output -  Net 560 ml        Physical Exam: Vital Signs Blood pressure 124/77, pulse 81, temperature 97.8 F (36.6 C), resp. rate 16, height 5\' 7"  (1.702 m), weight 81.8 kg, SpO2 100 %.   General: awake, alert, appropriate, standing, then sitting at sink getting ready, OT at bedside, doing ADL, NAD HENT: conjugate gaze; oropharynx moist- stye healed, but still has little bump there CV: regular rate and rhythm; no JVD Pulmonary: CTA B/L; no W/R/R- good air movement GI: soft, NT, ND, (+)BS- slightly protuberant Psychiatric: appropriate, but a little flat this AM Neurological: Ox3 Skin: Warm and dry.  Intact. No skin breakdown on backside.  Musc: No edema in extremities.  No tenderness in extremities. Motor: Bilateral upper extremities: 4 -/5 proximal distal, unchanged Right lower extremity: Hip flexion, knee extension 2+/5, ankle dorsiflexion 4/5, stable Left lower extremity: Hip flexion, knee extension 3 -/5, ankle dorsiflexion 4/5, stable  Assessment/Plan: 1. Functional deficits which require 3+ hours per day of interdisciplinary therapy in a comprehensive inpatient rehab setting.  Physiatrist is providing close team supervision and 24 hour management of active medical problems listed  below.  Physiatrist and rehab team continue to assess barriers to discharge/monitor patient progress toward functional and medical goals  Care Tool:  Bathing    Body parts bathed by patient: Right arm,Left arm,Chest,Abdomen,Front perineal area,Buttocks,Right upper leg,Left upper leg,Face   Body parts bathed by helper: Right lower leg,Left lower leg Body parts n/a: Left lower leg,Right lower leg   Bathing assist Assist Level: Minimal Assistance - Patient > 75%     Upper Body Dressing/Undressing Upper body dressing   What is the patient wearing?: Pull over shirt    Upper body assist Assist Level: Set up assist    Lower Body Dressing/Undressing Lower body dressing      What is the patient wearing?: Incontinence brief,Underwear/pull up     Lower body assist Assist for lower body dressing: Moderate Assistance - Patient 50 - 74%     Toileting Toileting    Toileting assist Assist for toileting: Moderate Assistance - Patient 50 - 74%     Transfers Chair/bed transfer  Transfers assist     Chair/bed transfer assist level: Minimal Assistance - Patient > 75%     Locomotion Ambulation   Ambulation assist   Ambulation activity did not occur: Safety/medical concerns  Assist level: Minimal Assistance - Patient > 75% Assistive device: Walker-rolling Max distance: 100   Walk 10 feet activity   Assist  Walk 10 feet activity did not occur: Safety/medical concerns  Assist level: Minimal Assistance - Patient > 75% Assistive device: Walker-rolling   Walk 50 feet activity   Assist Walk 50 feet with 2 turns activity did not occur: Safety/medical concerns  Assist level: Minimal Assistance - Patient > 75% Assistive device: Walker-rolling    Walk 150 feet activity   Assist Walk 150 feet activity did not occur: Safety/medical concerns  Assist level: Minimal Assistance - Patient > 75% Assistive device: Walker-rolling    Walk 10 feet on uneven surface   activity   Assist Walk 10 feet on uneven surfaces activity did not occur: Safety/medical concerns         Wheelchair     Assist Will patient use wheelchair at discharge?: Yes Type of Wheelchair: Manual    Wheelchair assist level: Supervision/Verbal cueing Max wheelchair distance: 190    Wheelchair 50 feet with 2 turns activity    Assist        Assist Level: Minimal Assistance - Patient > 75%   Wheelchair 150 feet activity     Assist      Assist Level: Minimal Assistance - Patient > 75%   Blood pressure 124/77, pulse 81, temperature 97.8 F (36.6 C), resp. rate 16, height 5\' 7"  (1.702 m), weight 81.8 kg, SpO2 100 %.  Medical Problem List and Plan: 1.  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  Continue CIR 2.  Antithrombotics:  -DVT/anticoagulation:  Pharmaceutical: Continue Lovenox  2/17- Very high risk of DVT due to SCI-will need 2-3 months of Lovenox per CHEST guidelines.              -antiplatelet therapy: N/A 3. Pain Management: Continue Oxycodone prn.  Off Lyrica- on Duloxetine 60 mg QHS, increase to duloxetine to 90 mg QHS since can't do Gabapentin or Lyrica.   3/15- pt reports pain better controlled-con't regimen             Monitor with increased exertion, particularly neuropathic pain 4. Mood: LCSW to follow for evaluation and support.   Appreciate neuropsych follow-up             -antipsychotic agents: N/A 5. Neuropsych: This patient is capable of making decisions on her own behalf. 6. Skin/Wound Care:  Monitor incision for healing.  Added protein supplement to help promote healing.  7. Fluids/Electrolytes/Nutrition: Monitor I/Os.  8. ABLA/macrocytosis:   Vitamin B12 ordered for tomorrow 9. Hypokalemia/Hypomagnesemia: Has resolved with brief supplementation.              potassium 4.0 on 3/7  3/15- K+ 4.0 yesterday -con't to monitor  10.  Polysubstance abuse:              Vitamin B12/folate levels ordered.   Continue to counsel  11. Neurogenic bowel and constipation: KUB ordered to determine stool burden.  Bowel program with dig stim nightly   Added Senokot 1 tab, changed to senna S2 tabs nightly on 3/13  3/15- going daily 1-2x/day- con't regimen 12. Neurogenic bladder  Some improvement with timed voiding, however remains incontinent  3/14- says is dry during the day- but not at night- per pt.   3/15- says a little better with Enablex- con't regimen  See #18 13. Neuropathic pain  Lyrica increased Lyrica to 75 mg  TID, then DC'd due to edema  Controlled on 3/13 14. Spasticity  Increased baclofen to 15 mg TID for spasticity- watch for constipation and sedation.  3/15- better controlled- still has some tone, but using to walk, so don't want MAS of 0- con't regimen 15. Dry skin: Eucerin ordered, encouraged use.  16. B/L elbow/arm swelling  Improved off of Lyrica 17. Dysphagia  D 3 thins, continue to advance as tolerated 18. Urinary urgency/frequency  Trial Myrbetriq 25 mg QHS for urinary frequency,?  DC'd on 2/25  Added Flomax,?  DC'd on 3/2  Trial Enablex on 3/13  3/14- no change so far, but is normal to take a few days  3/15- slightly better per pt- con't regimen  See #13 20. Severe calluses on Feet  2/27- feet warmed with warm wet washcloth, then lotion applied, then vaseline over that BID- to help cracks/calluses.    LOS: 27 days A FACE TO FACE EVALUATION WAS PERFORMED  Megan Lovorn 08/07/2020, 8:47 AM

## 2020-08-07 NOTE — Progress Notes (Signed)
Physical Therapy Session Note  Patient Details  Name: Yvette Phillips MRN: [id number] Date of Birth: 02/16/1961  Today's Date: 08/07/2020 PT Individual Time: 1345-1445 PT Individual Time Calculation (min): 60 min   Short Term Goals: Week 4:  PT Short Term Goal 1 (Week 4): [name] perform bed mobility with min A consistently PT Short Term Goal 2 (Week 4): Pt ascend/descend 4 x 6" stairs with max A PT Short Term Goal 3 (Week 4): [name] perform least restrictive transfer with CGA consistently  Skilled Therapeutic Interventions/Progress Updates:    Pt received seated in recliner in room, agreeable to PT session. No complaints of pain, pt reports stiffness in B UE and LE as well as increase in feeling of "heaviness" in limbs. PA notified as pt has reported this over the past few days as well as the pt has required increased assist with transfers at times. Sit to stand with min A from recliner to RW. Ambulatory transfer to elevated BSC over toilet in bathroom with RW and mod A for short distance gait. Toilet transfer with mod A, CGA for 3/3 toileting tasks. Pt able to continently void urine while seated on toilet. Dependent transport via w/c to/from therapy gym for time and energy conservation. Standing squats 2 x 10 reps with RW, manual cues at L knee to prevent valgus, use of mirror for visual feedback. Standing alt L/R 4" step-taps with RW and min A progressing to 4" step-ups with RW and mod A for balance. Pt exhibits improved unlocking of knee joint when descending step this date. Ascend/descend 8 x 3" stairs with 2 handrails and mod A for balance, B knees blocked upon descent due to fatigue. Pt left seated in recliner in room with needs in reach, chair alarm in place at end of session.  Therapy Documentation Precautions:  Precautions Precautions: Fall,Cervical Precaution Comments: s/p ACDF C4-5 (no cervical brace needed per acute), BUE/LE weakness Restrictions Weight Bearing Restrictions:  No   Therapy/Group: Individual Therapy   Peter Congo, PT, DPT  08/07/2020, 5:08 PM

## 2020-08-07 NOTE — Progress Notes (Signed)
Occupational Therapy Session Note  Patient Details  Name: Maecyn Panning MRN: [id number] Date of Birth: February 03, 1961  Today's Date: 08/07/2020 OT Individual Time: 0700-0800 OT Individual Time Calculation (min): 60 min    Short Term Goals: Week 5:  OT Short Term Goal 1 (Week 5): STG=LTG 2/2 ELOS  Skilled Therapeutic Interventions/Progress Updates:    OT intervention with focus on functional amb with RW, standing balance, bathing/dressing with sit<>stand from w/c at sink, toileting, and safety awareness to increase independence with BADLs. Amb with RW at [facility] level. Mod A for toileting. Mod A for LB dressing tasks. Standing balance with CGA with/without UE support. Self feeding with setup. Pt able to partially thread BLE into pants and pull up to knees. Pt amb with RW to recliner and remained in recliner. Seat alarm activated. All needs within reach.   Therapy Documentation Precautions:  Precautions Precautions: Fall,Cervical Precaution Comments: s/p ACDF C4-5 (no cervical brace needed per acute), BUE/LE weakness Restrictions Weight Bearing Restrictions: No Pain:  Pt denies pain this morning   Therapy/Group: Individual Therapy  [name] 08/07/2020, 8:05 AM

## 2020-08-07 NOTE — Progress Notes (Signed)
Patient reporting BUE heaviness (and still with some swelling). PT also reports that she needs a little more help with transfers. Question infection v/s neurological -->discussed with Dr. Berline Chough. CBC yesterday without leucocytosis, no recent medication changes X addition of enablex and no fevers.  Will order UA/UCS to rule out infection as cause-->If negative may need MRI C spine for work up.

## 2020-08-07 NOTE — Progress Notes (Signed)
Occupational Therapy Weekly Progress Note  Patient Details  Name: Yvette Phillips MRN: [id number] Date of Birth: [birth year]  Beginning of progress report period: July 31, 2020 End of progress report period: August 07, 2020  Pt's ELOS extended to 3/24 during last [employer]. Pt continues to make progress with bed mobility, functional amb with RW, bathing/dressing with sit<>stand from from w/c at sink, and toileting tasks. Pt currently completes UB bathing/dressing tasks with supervision. Pt requires mod A for LB bathing and dressing tasks. All functional transfers with CGA using RW. Pt requires mod A for toileting tasks (primarily pulling pants over hips).   Patient continues to demonstrate the following deficits: muscle weakness, decreased cardiorespiratoy endurance, impaired timing and sequencing, abnormal tone, unbalanced muscle activation, decreased coordination and decreased motor planning and decreased standing balance, decreased postural control and decreased balance strategies and therefore will continue to benefit from skilled OT intervention to enhance overall performance with BADL and Reduce care partner burden.  Patient progressing toward long term goals..  Continue plan of care.  OT Short Term Goals Week 4:  OT Short Term Goal 1 (Week 4): STG=LTG 2/2 ELOS OT Short Term Goal 1 - Progress (Week 4): Progressing toward goal Week 5:  OT Short Term Goal 1 (Week 5): STG=LTG 2/2 ELOS        Lavone Neri [facility] 08/07/2020, 6:51 AM

## 2020-08-07 NOTE — Progress Notes (Signed)
Patient ID: Yvette Phillips, female   DOB: September 04, 1960, 60 y.o.   MRN: [id number]  SW returned phone call/left message for [name] [phone]) who is pt new caseworker. SW waiting on follow-up.   SW met with pt in room to provide updates from team conference, and d/c date remains 3/24. When discussing with pt if her sister and brother in law will be present for family edu on 3/17, pt reported her sister Yvette Phillips said she and her husband would not be coming, and she also has an upcoming cataract surgery. She also stated her new caseworker has stated she would be working on housing for pt in [place]. SW informed pt waiting to hear back from caseworker, and that has not been confirmed. Pt aware SW to follow-up with her sister Yvette Phillips to confirm if she will be in for family edu since not sure on her housing options.   [name], MSW, [place] Office: [phone] Cell: [phone] Fax: [phone]

## 2020-08-08 LAB — URINALYSIS, ROUTINE W REFLEX MICROSCOPIC
Bilirubin Urine: NEGATIVE
Glucose, UA: NEGATIVE mg/dL
Ketones, ur: NEGATIVE mg/dL
Nitrite: POSITIVE — AB
Protein, ur: NEGATIVE mg/dL
Specific Gravity, Urine: >1.03 — ABNORMAL HIGH (ref 1.005–1.030)
pH: 5.5 (ref 5.0–8.0)

## 2020-08-08 LAB — URINALYSIS, MICROSCOPIC (REFLEX)

## 2020-08-08 NOTE — Progress Notes (Signed)
Orthopedic Tech Progress Note Patient Details:  Yvette Phillips 16-Mar-1961 [id number] Called in order to [facility] for a BLE AFO CONSULT  Patient ID: Yvette Phillips, female   DOB: Dec 20, 1960, 60 y.o.   MRN: [id number]   [name] 08/08/2020, 8:58 AM

## 2020-08-08 NOTE — Progress Notes (Signed)
Physical Therapy Weekly Progress Note  Patient Details  Name: Yvette Phillips MRN: [id number] Date of Birth: [birth year]  Beginning of progress report period: August 01, 2020 End of progress report period: August 08, 2020  Today's Date: 08/08/2020 PT Individual Time: 1000-1100, 1300-1400 PT Individual Time Calculation (min): 60 min, 60 min  Patient has met 2 of 3 short term goals. Bed mobility with supervision-min A for LE management. STS to RW with CGA to min A with fatigue. Gait up to 150 ft and stand pivot transfers with RW and CGA, min A when fatigued. Initiated stair training, min A on 3" stairs, max A for 6" steps. AFO consult with [facility] on 3/16, determined that pt will benefit from L foot up brace and R rigid PLS AFO. Family education was scheduled for this date, but family declined to be present.   Patient continues to demonstrate the following deficits muscle weakness, decreased cardiorespiratoy endurance, abnormal tone, unbalanced muscle activation and decreased coordination and decreased sitting balance, decreased postural control and decreased balance strategies and therefore will continue to benefit from skilled PT intervention to increase functional independence with mobility.  Patient progressing toward long term goals..  Continue plan of care.  PT Short Term Goals Week 4:  PT Short Term Goal 1 (Week 4): Pt will perform bed mobility with min A consistently PT Short Term Goal 1 - Progress (Week 4): Progressing toward goal PT Short Term Goal 2 (Week 4): Pt ascend/descend 4 x 6" stairs with max A PT Short Term Goal 2 - Progress (Week 4): Met PT Short Term Goal 3 (Week 4): Pt will perform least restrictive transfer with CGA consistently PT Short Term Goal 3 - Progress (Week 4): Met Week 5:  PT Short Term Goal 1 (Week 5): Pt will perform bed mobility with supervision consistently PT Short Term Goal 2 (Week 5): Pt will walk x 150 ft with CGA and LRAD. PT Short Term Goal 3 (Week 5): Pt  will ascend and desend 4 x 6" steps with min A consistently. `  Skilled Therapeutic Interventions/Progress Updates:    Session 1: Pt seated on commode with stedy with NT present. Pt was not able to void at this time, mod A for clothing management. Pt transported to gym for energy conservation. Pt performed LAQ and kicked 1 kg med ball seated on the edge of mat with supervision. Pt performed STS 2 x 10 with  CGA and RW. Navigated 3" stairs x 8 with min A to ascend and mod A to descend, blocking knees due to fatigue. Pt was able to ascend 6" stairs x 4 with min A but descended 3" steps mod A due to fatigue. Gait 3 x 50 ft with RW and CGA with seated rest breaks. Pt returned to recliner after session with CGA and was left with legs elevated, chair alarm active, and all needs in reach.   Session 2: Pt received in recliner and agreeable to therapy. Stand pivot transfer to wc with RW and CGA. Today's session focussed on AFO consultation with Gerald Stabs from [facility] to discuss best bracing/orthotic options. Pt walked with RW and CGA throughout session with various bracing set ups for assessment including BIL foot up brace, R GR AFO, R heel wedge, R PLS.  Determined that pt will benefit from L foot up brace and R rigid PLS AFO.  Pt then ascended 6" steps 2 x 4 with min A and descended 1 x 4 with max A. Pt descended 3" steps with  min A for safety due to fatigue and knee pain.  Pt returned to bed after session with supervision and VCs for technique. Pt was left supine in bed with bed alarm active and all needs in reach.  Therapy Documentation Precautions:  Precautions Precautions: Fall,Cervical Precaution Comments: s/p ACDF C4-5 (no cervical brace needed per acute), BUE/LE weakness Restrictions Weight Bearing Restrictions: No   Therapy/Group: Individual Therapy  Sharen Counter 08/08/2020, 12:39 PM

## 2020-08-08 NOTE — Progress Notes (Addendum)
PROGRESS NOTE   Subjective/Complaints:  Pt having difficulty with weakness- esp in UEs- that's new- not progressing and has gone back functionally based on PT and OT reports-  Also needs B/L AFO consults- for B/L foot drop.  Will order Cervical MRI to see if anything causing this- U/A is negative for UTI.   Pt also notes, bladder a little better, but the same as yesterday- with voiding/continent.   ROS:   Pt denies SOB, abd pain, CP, N/V/C/D, and vision changes    Objective:   No results found. Recent Labs    08/06/20 0600  WBC 7.7  HGB 12.7  HCT 37.9  PLT 341   Recent Labs    08/06/20 0600  NA 137  K 4.0  CL 102  CO2 27  GLUCOSE 87  BUN 17  CREATININE 0.68  CALCIUM 9.7    Intake/Output Summary (Last 24 hours) at 08/08/2020 0817 Last data filed at 08/07/2020 1904 Gross per 24 hour  Intake 580 ml  Output --  Net 580 ml        Physical Exam: Vital Signs Blood pressure 108/66, pulse 84, temperature 97.8 F (36.6 C), temperature source Oral, resp. rate 18, height 5\' 7"  (1.702 m), weight 81.8 kg, SpO2 94 %.    General: awake, alert, appropriate, pt sitting up eating breakfast- >50% eaten already, NAD HENT: conjugate gaze; oropharynx moist- stye under R eye- a little bump, but has drained CV: regular rate and rhythm; no JVD Pulmonary: CTA B/L; no W/R/R- good air movement GI: soft, NT, ND, (+)BS Psychiatric: appropriate, but sad due to family situation Neurological: Ox3 Skin: Warm and dry.  Intact. No skin breakdown on backside.  Musc: No edema in extremities.  No tenderness in extremities. Motor: Bilateral upper extremities: 4 -/5 proximal distal- UEs- today are slightly less- 3+/5, but hadn't checked strength in a week, and per therapy HAD made gains since then.  Right lower extremity: Hip flexion, knee extension 2+/5, ankle dorsiflexion 4/5, stable Left lower extremity: Hip flexion, knee extension  3 -/5, ankle dorsiflexion 4/5, stable  Assessment/Plan: 1. Functional deficits which require 3+ hours per day of interdisciplinary therapy in a comprehensive inpatient rehab setting.  Physiatrist is providing close team supervision and 24 hour management of active medical problems listed below.  Physiatrist and rehab team continue to assess barriers to discharge/monitor patient progress toward functional and medical goals  Care Tool:  Bathing    Body parts bathed by patient: Right arm,Left arm,Chest,Abdomen,Front perineal area,Buttocks,Right upper leg,Left upper leg,Face   Body parts bathed by helper: Right lower leg,Left lower leg Body parts n/a: Left lower leg,Right lower leg   Bathing assist Assist Level: Minimal Assistance - Patient > 75%     Upper Body Dressing/Undressing Upper body dressing   What is the patient wearing?: Pull over shirt    Upper body assist Assist Level: Set up assist    Lower Body Dressing/Undressing Lower body dressing      What is the patient wearing?: Incontinence brief,Underwear/pull up     Lower body assist Assist for lower body dressing: Moderate Assistance - Patient 50 - 74%     Toileting Toileting  Toileting assist Assist for toileting: Moderate Assistance - Patient 50 - 74%     Transfers Chair/bed transfer  Transfers assist     Chair/bed transfer assist level: Minimal Assistance - Patient > 75%     Locomotion Ambulation   Ambulation assist   Ambulation activity did not occur: Safety/medical concerns  Assist level: Minimal Assistance - Patient > 75% Assistive device: Walker-rolling Max distance: 100   Walk 10 feet activity   Assist  Walk 10 feet activity did not occur: Safety/medical concerns  Assist level: Minimal Assistance - Patient > 75% Assistive device: Walker-rolling   Walk 50 feet activity   Assist Walk 50 feet with 2 turns activity did not occur: Safety/medical concerns  Assist level: Minimal  Assistance - Patient > 75% Assistive device: Walker-rolling    Walk 150 feet activity   Assist Walk 150 feet activity did not occur: Safety/medical concerns  Assist level: Minimal Assistance - Patient > 75% Assistive device: Walker-rolling    Walk 10 feet on uneven surface  activity   Assist Walk 10 feet on uneven surfaces activity did not occur: Safety/medical concerns         Wheelchair     Assist Will patient use wheelchair at discharge?: Yes Type of Wheelchair: Manual    Wheelchair assist level: Supervision/Verbal cueing Max wheelchair distance: 190    Wheelchair 50 feet with 2 turns activity    Assist        Assist Level: Minimal Assistance - Patient > 75%   Wheelchair 150 feet activity     Assist      Assist Level: Minimal Assistance - Patient > 75%   Blood pressure 108/66, pulse 84, temperature 97.8 F (36.6 C), temperature source Oral, resp. rate 18, height 5\' 7"  (1.702 m), weight 81.8 kg, SpO2 94 %.  Medical Problem List and Plan: 1.  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  Continue CIR  3/16- will order B/L AFOs consult for pt due to B/L foot drop 2.  Antithrombotics:  -DVT/anticoagulation:  Pharmaceutical: Continue Lovenox  2/17- Very high risk of DVT due to SCI-will need 2-3 months of Lovenox per CHEST guidelines.              -antiplatelet therapy: N/A 3. Pain Management: Continue Oxycodone prn.  Off Lyrica- on Duloxetine 60 mg QHS, increase to duloxetine to 90 mg QHS since can't do Gabapentin or Lyrica.   3/15- pt reports pain better controlled-con't regimen  3/16- is really only taking tylenol prn now- no opiates- con't regimen             Monitor with increased exertion, particularly neuropathic pain 4. Mood: LCSW to follow for evaluation and support.   Appreciate neuropsych follow-up             -antipsychotic agents:  N/A 5. Neuropsych: This patient is capable of making decisions on her own behalf. 6. Skin/Wound Care:  Monitor incision for healing.  Added protein supplement to help promote healing.  7. Fluids/Electrolytes/Nutrition: Monitor I/Os.  8. ABLA/macrocytosis:   Vitamin B12 ordered for tomorrow 9. Hypokalemia/Hypomagnesemia: Has resolved with brief supplementation.              potassium 4.0 on 3/7  3/15- K+ 4.0 yesterday -con't to monitor  10. Polysubstance abuse:              Vitamin B12/folate levels ordered.   Continue to counsel  11. Neurogenic bowel  and constipation: KUB ordered to determine stool burden.  Bowel program with dig stim nightly   Added Senokot 1 tab, changed to senna S2 tabs nightly on 3/13  3/15- going daily 1-2x/day- con't regimen 12. Neurogenic bladder  Some improvement with timed voiding, however remains incontinent  3/14- says is dry during the day- but not at night- per pt.   3/15- says a little better with Enablex- con't regimen  See #18 13. Neuropathic pain  Lyrica increased Lyrica to 75 mg TID, then DC'd due to edema  Controlled on 3/13 14. Spasticity  Increased baclofen to 15 mg TID for spasticity- watch for constipation and sedation.  3/15- better controlled- still has some tone, but using to walk, so don't want MAS of 0- con't regimen 15. Dry skin: Eucerin ordered, encouraged use.  16. B/L elbow/arm swelling  Improved off of Lyrica 17. Dysphagia  D 3 thins, continue to advance as tolerated 18. Urinary urgency/frequency  Trial Myrbetriq 25 mg QHS for urinary frequency,?  DC'd on 2/25  Added Flomax,?  DC'd on 3/2  Trial Enablex on 3/13  3/14- no change so far, but is normal to take a few days  3/15- slightly better per pt- con't regimen  See #13 20. Severe calluses on Feet  2/27- feet warmed with warm wet washcloth, then lotion applied, then vaseline over that BID- to help cracks/calluses.   21. New UE weakness  3/16- will order cervical MRI- since  doesn't have UTI/a reason other than neck that could be causing increased UE weakness.   LOS: 28 days A FACE TO FACE EVALUATION WAS PERFORMED  Harlis Champoux 08/08/2020, 8:17 AM

## 2020-08-08 NOTE — Progress Notes (Signed)
Occupational Therapy Session Note  Patient Details  Name: Yvette Phillips MRN: [id number] Date of Birth: [birth year]  Today's Date: 08/08/2020 OT Individual Time: 2820-6015 OT Individual Time Calculation (min): 55 min    Short Term Goals: Week 5:  OT Short Term Goal 1 (Week 5): STG=LTG 2/2 ELOS  Skilled Therapeutic Interventions/Progress Updates:    OT intervention with focus on bed mobility, sit<>stand, standing balance, toileting, functional amb with RW, bathing/dressing with sit<>stand from w/c at sink, activity tolerance, and safety awareness to increase independence with BADLs. Min A for supine>sit EOB. Sit<>stand and functional amb with RW requiring CGA. Pt incontinent of bowel with initial sit<>stand. Pt dependent for hygiene and doffing brief. LB dressing with mod A. UB bathing/dressing with supervision. Standing balance at sink with CGA. Pt amb with RW to recliner and engaged in BUE/hand strengthening tasks with yellow theraputy. Pt remained in recliner with all needs within reach. Seat alarm activated.   Therapy Documentation Precautions:  Precautions Precautions: Fall,Cervical Precaution Comments: s/p ACDF C4-5 (no cervical brace needed per acute), BUE/LE weakness Restrictions Weight Bearing Restrictions: No   Pain:  Pt denies pain this morning     Therapy/Group: Individual Therapy  [name] 08/08/2020, 9:03 AM

## 2020-08-08 NOTE — Progress Notes (Addendum)
Patient ID: Yvette Phillips, female   DOB: 1961/01/13, 60 y.o.   MRN: [id number]  SW follow-up with pt sister Alona Bene [phone]) to discuss fam edu tomorrow. She reported that she would not be coming in as she and her husband are unable to provide care, and the pt told her the new caseworker said she would locate housing. SW reiterated that this had not been confirmed. Her sister stated she is really not able to provide care to pt.   SW left message for  [name] [facility] [phone] cell) to confirm she will begin working on housing options for pt, and waiting on follow-up.  *SW spoke with [name] to discuss pt discharge plan. Reports she is going to staff case with her supervisor as SW explained pt does not have any insurance, and wanted to know if they will assist pt with housing. Caseworker intends to be here tomorrow around 11am, and SW will discuss further.   Cecile Sheerer, MSW, LCSWA Office: [phone] Cell: [phone] Fax: [phone]

## 2020-08-09 ENCOUNTER — Inpatient Hospital Stay (HOSPITAL_COMMUNITY): Payer: Medicaid Other

## 2020-08-09 IMAGING — MR MR CERVICAL SPINE W/O CM
6 series · 32 of 48 positions shown · non-contrast
Comparison: [DATE]

CLINICAL DATA: Previous spinal cord injury.  Worsening weakness.

EXAM:
MRI CERVICAL SPINE WITHOUT CONTRAST
TECHNIQUE: Multiplanar, multisequence MR imaging of the cervical spine was
performed. No intravenous contrast was administered.

[Series 5: T2 · sagittal · 3.0mm · 0.69mm/px · 4 of 15 slices shown (1 of 2)]
[im 1/15]
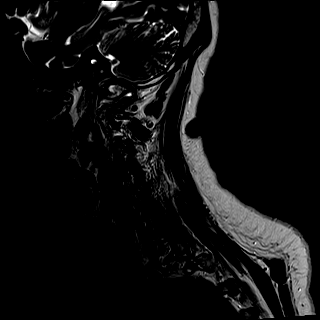
[im 5/15]
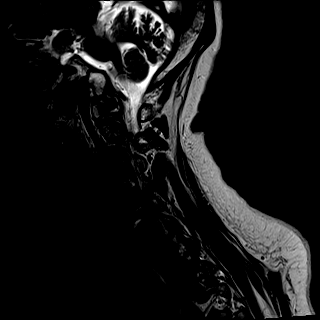
[im 10/15]
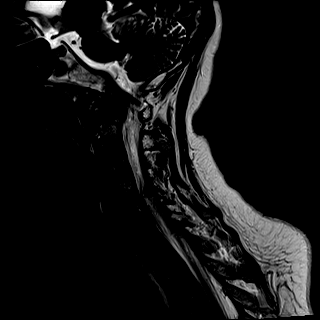
[im 15/15]
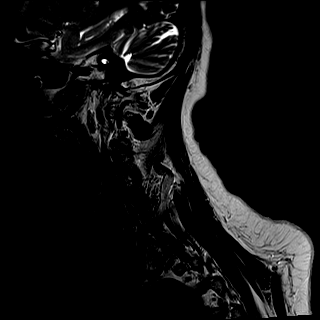

[Series 6: T1 · sagittal · 3.0mm · 0.69mm/px · 4 of 15 slices shown (1 of 2)]
[im 1/15]
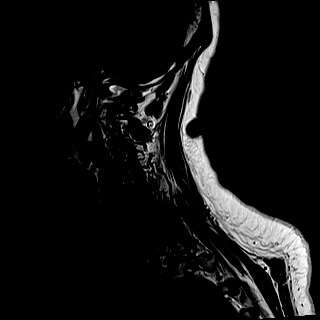
[im 5/15]
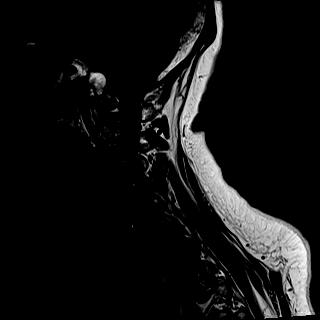
[im 10/15]
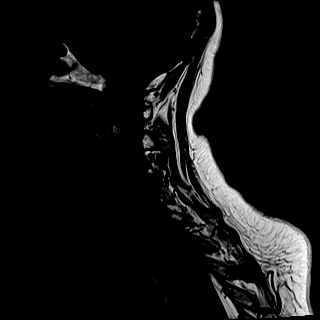
[im 15/15]
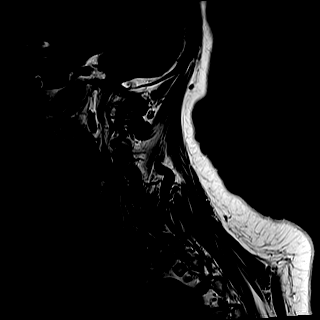

[Series 7: STIR · sagittal · 3.0mm · 0.86mm/px · 4 of 15 slices shown]
[im 1/15]
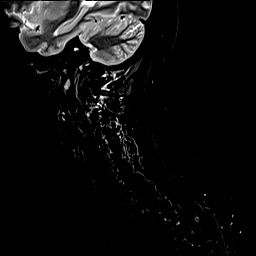
[im 5/15]
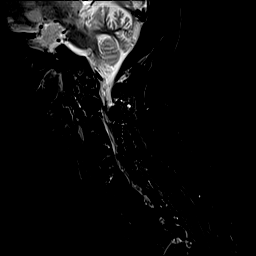
[im 10/15]
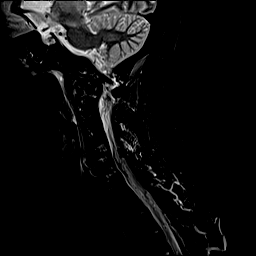
[im 15/15]
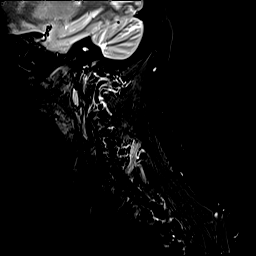

[Series 8: T2 · axial · 3.0mm · 0.66mm/px · z∈[-139,-21]mm · 9 of 40 slices shown (2 of 2)]
[im 1/40]
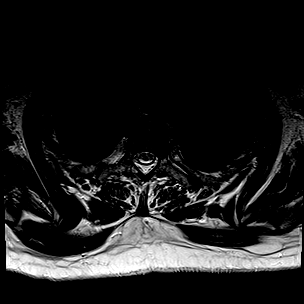
[im 8/40]
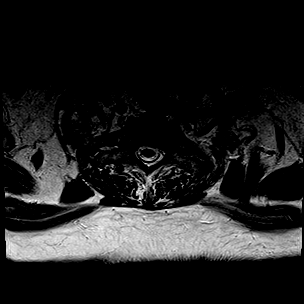
[im 11/40]
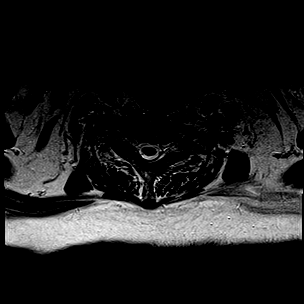
[im 18/40]
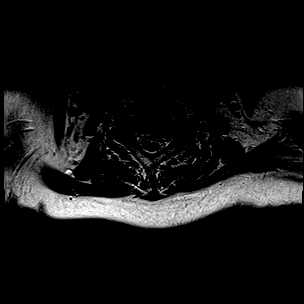
[im 22/40]
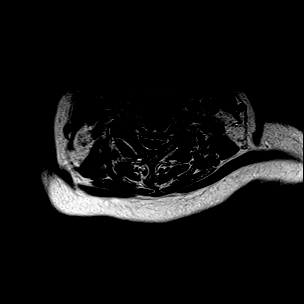
[im 29/40]
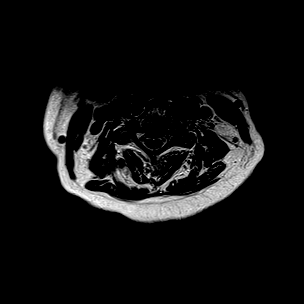
[im 32/40]
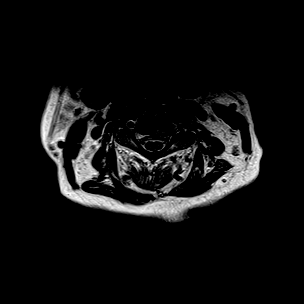
[im 36/40]
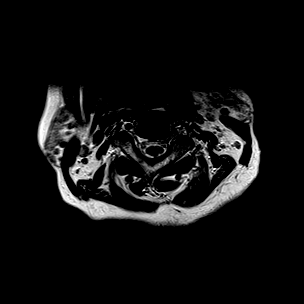
[im 40/40]
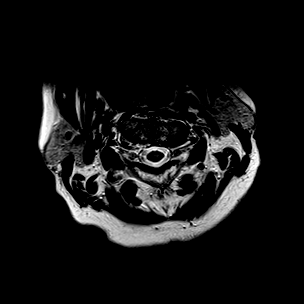

[Series 9: GRE · axial · 3.0mm · 0.39mm/px · z∈[-139,-118]mm · 2 of 40 slices shown]
[im 1/40]
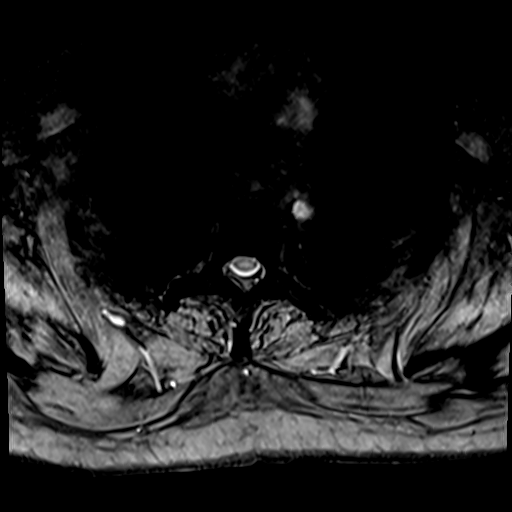
[im 8/40]
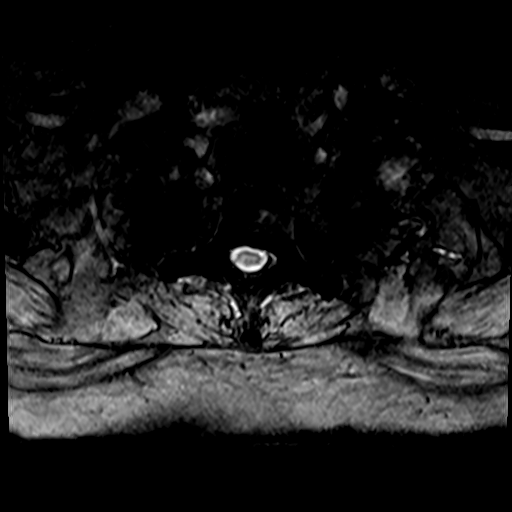

[Series 10: T1 · axial · 3.0mm · 0.39mm/px · z∈[-139,-21]mm · 9 of 40 slices shown (2 of 2)]
[im 1/40]
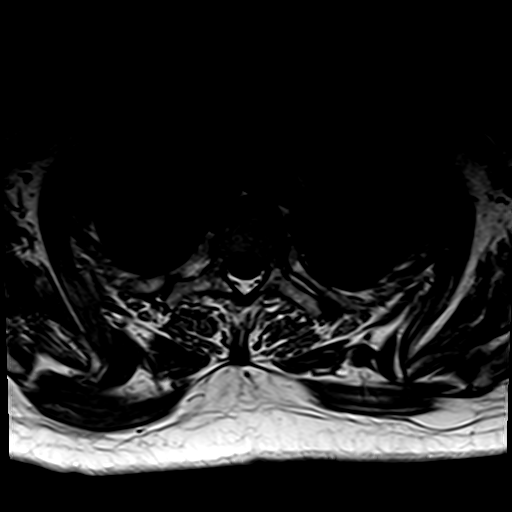
[im 8/40]
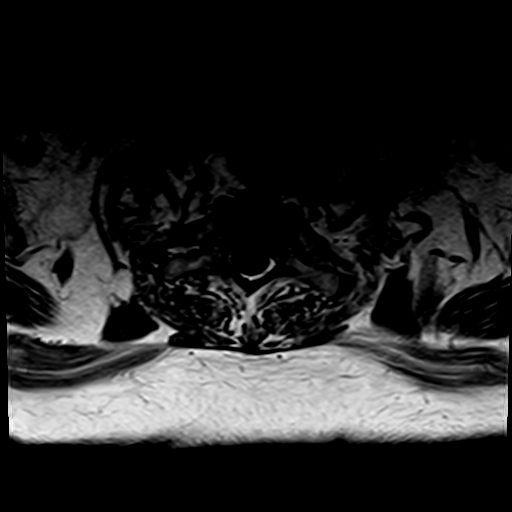
[im 11/40]
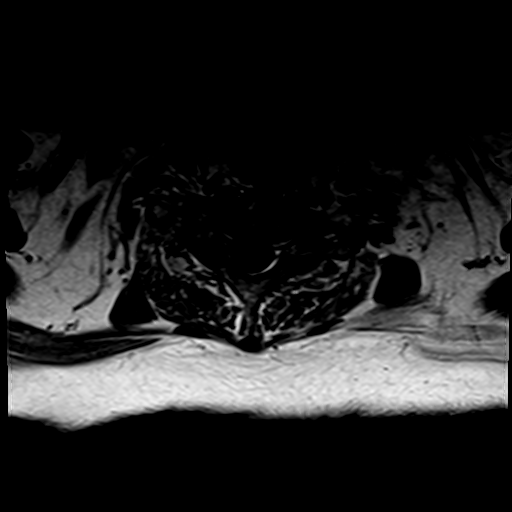
[im 18/40]
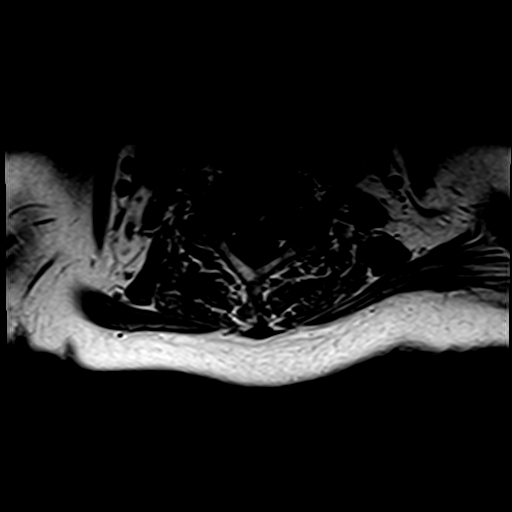
[im 22/40]
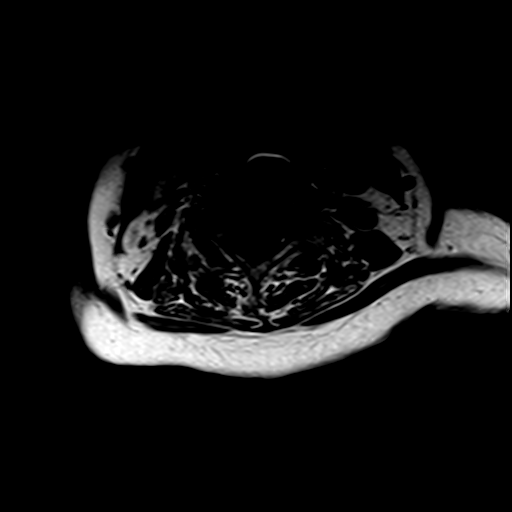
[im 29/40]
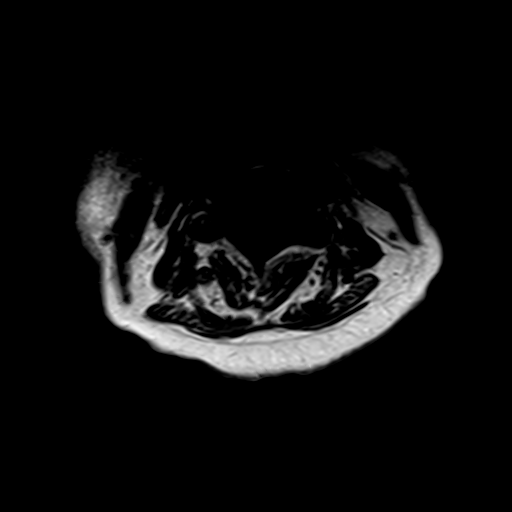
[im 32/40]
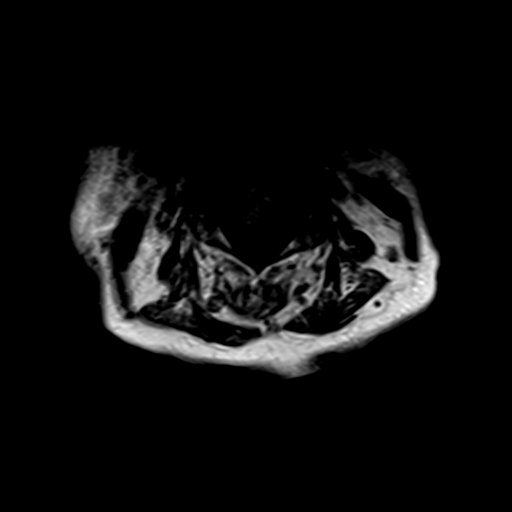
[im 36/40]
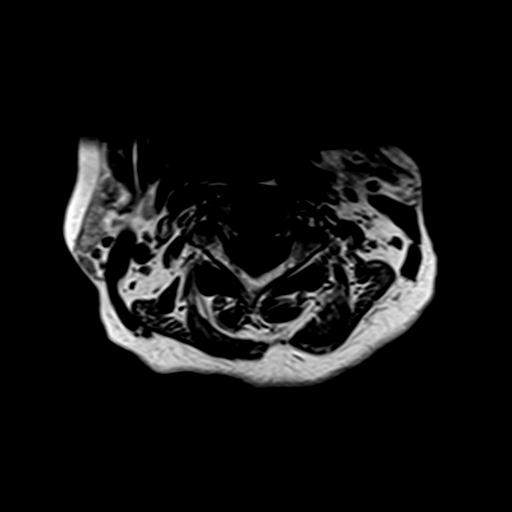
[im 40/40]
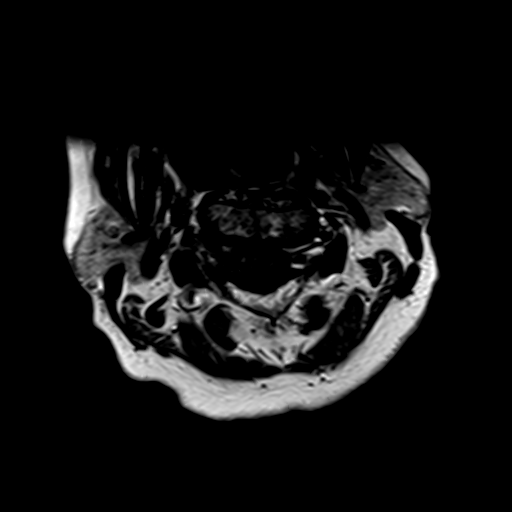

[32 of 48 positions shown; findings below may reference images not displayed]

FINDINGS: Alignment: Normal

Vertebrae: Interval ACDF C4-5.

Cord: There is no longer any active cord compression at the C4-5
level following ACDF. Cord volume loss and gliosis is present at
this level. Less cord edema emanating out from that level when
compared to the study of 5 weeks ago.

Posterior Fossa, vertebral arteries, paraspinal tissues: Negative

Disc levels:

Foramen magnum is widely patent. Ordinary osteoarthritis at the C1-2
articulation but no encroachment upon the neural structures.

C2-3 and C3-4: Normal

C4-5: As above, interval ACDF. Good decompression of the central
canal. Subarachnoid space now surrounds the cord without ongoing
cord compression. Myelomalacia at this level with cord volume loss
and gliotic type signal. There is no longer any active cord edema
emanating out from this level.

C5-6: No disc pathology or central canal stenosis. Mild bony
foraminal narrowing on the left, not likely compressive.

C6-7: Normal

C7-T1: Normal
IMPRESSION: 1. Interval ACDF at C4-5. Good decompression of the central canal.
There is no longer any ongoing cord compression. Myelomalacia at
this level with cord volume loss and gliotic type signal. Less cord
edema emanating out from that level when compared to the
preoperative study of 5 weeks ago.
2. Mild bony foraminal narrowing on the left at C5-6, not likely
compressive. Otherwise normal.

## 2020-08-09 MED ORDER — BACLOFEN 5 MG HALF TABLET
15.0000 mg | ORAL_TABLET | Freq: Four times a day (QID) | ORAL | Status: DC
  Administered 2020-08-09 – 2020-08-13 (×16): 15 mg via ORAL
  Filled 2020-08-09 (×16): qty 1

## 2020-08-09 NOTE — Progress Notes (Signed)
Occupational Therapy Session Note  Patient Details  Name: Prescious Hurless MRN: [id number] Date of Birth: Sep 27, 1960  Today's Date: 08/09/2020 OT Individual Time: 1345-1435 OT Individual Time Calculation (min): 50 min    Short Term Goals: Week 5:  OT Short Term Goal 1 (Week 5): STG=LTG 2/2 ELOS  Skilled Therapeutic Interventions/Progress Updates:    Pt resting in recliner upon arrival. Pt requested to use toilet. Amb with RW to bathroom with CGA. Toileting with min A. Pt returned to room and stood at sink to wash hands. Pt returned to w/c. Transition to day room for standing activity at table-assembling and disassembling pvc pipe tree task. Pt practiced stacking small blocks. Pt attempted to pick up small pegs for 9 hole peg test but unable to pick up. Pt returned to room and transferred to recliner. Pt remained in recliner with all needs within reach and seat alarm activated.   Therapy Documentation Precautions:  Precautions Precautions: Fall,Cervical Precaution Comments: s/p ACDF C4-5 (no cervical brace needed per acute), BUE/LE weakness Restrictions Weight Bearing Restrictions: No   Pain:  Pt denies pain   Therapy/Group: Individual Therapy  [name] 08/09/2020, 2:50 PM

## 2020-08-09 NOTE — Progress Notes (Signed)
PROGRESS NOTE   Subjective/Complaints:  Pt reports spasms  Are still a little- but not bad.  Did better in therapy yesterday, so less weakness Bowel and bladder are "stable".   Spasms are really the worst at night, when she lays down- wondering if theres a way to treat them , at least at night.   ROS:   Pt denies SOB, abd pain, CP, N/V/C/D, and vision changes    Objective:   MR CERVICAL SPINE WO CONTRAST  Result Date: 08/09/2020 CLINICAL DATA:  Previous spinal cord injury.  Worsening weakness. EXAM: MRI CERVICAL SPINE WITHOUT CONTRAST TECHNIQUE: Multiplanar, multisequence MR imaging of the cervical spine was performed. No intravenous contrast was administered. COMPARISON:  07/03/2020 FINDINGS: Alignment: Normal Vertebrae: Interval ACDF C4-5. Cord: There is no longer any active cord compression at the C4-5 level following ACDF. Cord volume loss and gliosis is present at this level. Less cord edema emanating out from that level when compared to the study of 5 weeks ago. Posterior Fossa, vertebral arteries, paraspinal tissues: Negative Disc levels: Foramen magnum is widely patent. 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 C2-3 and C3-4: Normal C4-5: As above, interval ACDF. Good decompression of the central canal. Subarachnoid space now surrounds the cord without ongoing cord compression. Myelomalacia at this level with cord volume loss and gliotic type signal. There is no longer any active cord edema emanating out from this level. C5-6: No disc pathology or central canal stenosis. Mild bony foraminal narrowing on the left, not likely compressive. C6-7: Normal C7-T1: Normal IMPRESSION: 1. Interval ACDF at C4-5. Good decompression of the central canal. There is no longer any ongoing cord compression. Myelomalacia at this level with cord volume loss and gliotic type signal. Less cord edema  emanating out from that level when compared to the preoperative study of 5 weeks ago. 2. Mild bony foraminal narrowing on the left at C5-6, not likely compressive. Otherwise normal. Electronically Signed   By: Paulina Fusi M.D.   On: 08/09/2020 08:14   No results for input(s): WBC, HGB, HCT, PLT in the last 72 hours. No results for input(s): NA, K, CL, CO2, GLUCOSE, BUN, CREATININE, CALCIUM in the last 72 hours.  Intake/Output Summary (Last 24 hours) at 08/09/2020 0927 Last data filed at 08/09/2020 0800 Gross per 24 hour  Intake 660 ml  Output --  Net 660 ml        Physical Exam: Vital Signs Blood pressure 117/68, pulse 83, temperature 97.8 F (36.6 C), temperature source Oral, resp. rate 18, height 5\' 7"  (1.702 m), weight 81.8 kg, SpO2 96 %.     General: awake, alert, appropriate, sitting up in bedside chair,  NAD HENT: conjugate gaze; oropharynx moist- stye healing- basically rsolved CV: regular rate and rhythm; no JVD Pulmonary: CTA B/L; no W/R/R- good air movement GI: soft, NT, ND, (+)BS Psychiatric: appropriate- but can tell she's sad about family situation Neurological: Ox3- a few spasms seen in UEs and LEs- with touch- MAS 1+ in extremities  Skin: Warm and dry.  Intact. No skin breakdown on backside.  Musc: No edema in extremities.  No tenderness in extremities. Motor: Bilateral  upper extremities: 4 -/5 proximal distal- UEs- today  Are back to 4-/5 B/L Right lower extremity: Hip flexion, knee extension 2+/5, ankle dorsiflexion 4/5, stable Left lower extremity: Hip flexion, knee extension 3 -/5, ankle dorsiflexion 4/5, stable  Assessment/Plan: 1. Functional deficits which require 3+ hours per day of interdisciplinary therapy in a comprehensive inpatient rehab setting.  Physiatrist is providing close team supervision and 24 hour management of active medical problems listed below.  Physiatrist and rehab team continue to assess barriers to discharge/monitor patient progress  toward functional and medical goals  Care Tool:  Bathing    Body parts bathed by patient: Right arm,Left arm,Chest,Abdomen,Front perineal area,Buttocks,Right upper leg,Left upper leg,Face   Body parts bathed by helper: Right lower leg,Left lower leg Body parts n/a: Left lower leg,Right lower leg   Bathing assist Assist Level: Minimal Assistance - Patient > 75%     Upper Body Dressing/Undressing Upper body dressing   What is the patient wearing?: Pull over shirt    Upper body assist Assist Level: Set up assist    Lower Body Dressing/Undressing Lower body dressing      What is the patient wearing?: Incontinence brief,Underwear/pull up     Lower body assist Assist for lower body dressing: Moderate Assistance - Patient 50 - 74%     Toileting Toileting    Toileting assist Assist for toileting: Moderate Assistance - Patient 50 - 74%     Transfers Chair/bed transfer  Transfers assist     Chair/bed transfer assist level: Minimal Assistance - Patient > 75%     Locomotion Ambulation   Ambulation assist   Ambulation activity did not occur: Safety/medical concerns  Assist level: Minimal Assistance - Patient > 75% Assistive device: Walker-rolling Max distance: 100   Walk 10 feet activity   Assist  Walk 10 feet activity did not occur: Safety/medical concerns  Assist level: Minimal Assistance - Patient > 75% Assistive device: Walker-rolling   Walk 50 feet activity   Assist Walk 50 feet with 2 turns activity did not occur: Safety/medical concerns  Assist level: Minimal Assistance - Patient > 75% Assistive device: Walker-rolling    Walk 150 feet activity   Assist Walk 150 feet activity did not occur: Safety/medical concerns  Assist level: Minimal Assistance - Patient > 75% Assistive device: Walker-rolling    Walk 10 feet on uneven surface  activity   Assist Walk 10 feet on uneven surfaces activity did not occur: Safety/medical concerns          Wheelchair     Assist Will patient use wheelchair at discharge?: Yes Type of Wheelchair: Manual    Wheelchair assist level: Supervision/Verbal cueing Max wheelchair distance: 190    Wheelchair 50 feet with 2 turns activity    Assist        Assist Level: Minimal Assistance - Patient > 75%   Wheelchair 150 feet activity     Assist      Assist Level: Minimal Assistance - Patient > 75%   Blood pressure 117/68, pulse 83, temperature 97.8 F (36.6 C), temperature source Oral, resp. rate 18, height 5\' 7"  (1.702 m), weight 81.8 kg, SpO2 96 %.  Medical Problem List and Plan: 1.  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  Continue CIR  3/16- will order B/L AFOs consult for pt due to B/L foot drop  3/17- con't PT and OT- is progressing better today.  2.  Antithrombotics:  -DVT/anticoagulation:  Pharmaceutical: Continue Lovenox  2/17- Very high risk of DVT due to SCI-will need 2-3 months of Lovenox per CHEST guidelines.              -antiplatelet therapy: N/A 3. Pain Management: Continue Oxycodone prn.  Off Lyrica- on Duloxetine 60 mg QHS, increase to duloxetine to 90 mg QHS since can't do Gabapentin or Lyrica.   3/15- pt reports pain better controlled-con't regimen  3/16- is really only taking tylenol prn now- no opiates- con't regimen             Monitor with increased exertion, particularly neuropathic pain 4. Mood: LCSW to follow for evaluation and support.   Appreciate neuropsych follow-up             -antipsychotic agents: N/A 5. Neuropsych: This patient is capable of making decisions on her own behalf. 6. Skin/Wound Care:  Monitor incision for healing.  Added protein supplement to help promote healing.  7. Fluids/Electrolytes/Nutrition: Monitor I/Os.  8. ABLA/macrocytosis:   Vitamin B12 ordered for tomorrow 9. Hypokalemia/Hypomagnesemia: Has resolved with  brief supplementation.              potassium 4.0 on 3/7  3/15- K+ 4.0 yesterday -con't to monitor  10. Polysubstance abuse:              Vitamin B12/folate levels ordered.   Continue to counsel  11. Neurogenic bowel and constipation: KUB ordered to determine stool burden.  Bowel program with dig stim nightly   Added Senokot 1 tab, changed to senna S2 tabs nightly on 3/13  3/15- going daily 1-2x/day- con't regimen  3/17- con't PO bowel meds, not on bowel program 12. Neurogenic bladder  Some improvement with timed voiding, however remains incontinent  3/14- says is dry during the day- but not at night- per pt.   3/15- says a little better with Enablex- con't regimen  3/17- is wet at night, but usually timed voiding works during day.   See #18 13. Neuropathic pain  Lyrica increased Lyrica to 75 mg TID, then DC'd due to edema  Controlled on 3/13 14. Spasticity  Increased baclofen to 15 mg TID for spasticity- watch for constipation and sedation.  3/15- better controlled- still has some tone, but using to walk, so don't want MAS of 0- con't regimen  3/17- will increase baclofen to 15 mg QID since spasms aworse at night- might need to increase night time dose.  15. Dry skin: Eucerin ordered, encouraged use.  16. B/L elbow/arm swelling  Improved off of Lyrica 17. Dysphagia  D 3 thins, continue to advance as tolerated 18. Urinary urgency/frequency  Trial Myrbetriq 25 mg QHS for urinary frequency,?  DC'd on 2/25  Added Flomax,?  DC'd on 3/2  Trial Enablex on 3/13  3/14- no change so far, but is normal to take a few days  3/15- slightly better per pt- con't regimen  See #13 20. Severe calluses on Feet  2/27- feet warmed with warm wet washcloth, then lotion applied, then vaseline over that BID- to help cracks/calluses.   21. New UE weakness  3/16- will order cervical MRI- since doesn't have UTI/a reason other than neck that could be causing increased UE weakness.  3/17- not sure reason  for increased weakness- pt's cervical MRI looks better- no compression and less edema- if persists, will call [facility] again.    LOS: 29 days A FACE TO FACE EVALUATION WAS PERFORMED  Daina Cara 08/09/2020, 9:27 AM

## 2020-08-09 NOTE — Progress Notes (Signed)
Occupational Therapy Session Note  Patient Details  Name: Kathleena Freeman MRN: [id number] Date of Birth: 1960/11/04  Today's Date: 08/09/2020 OT Individual Time: 0700-0800 OT Individual Time Calculation (min): 60 min    Short Term Goals: Week 5:  OT Short Term Goal 1 (Week 5): STG=LTG 2/2 ELOS  Skilled Therapeutic Interventions/Progress Updates:    OT intervention with focus on BADL retraining, functional amb with RW, standing balance, toileting, and safety awareness to increase independence with BADLs. Functional amb with RW-CGA. Toileting with mod A. LB bathing with min A. LB dressing with max A this morning. Standing balance with close supervision. Pt requires more then a reasonable amount of time to complete tasks. Pt amb with RW in room to sit in recliner. Pt remained in recliner with all needs withn reach and seat alarm activated.   Therapy Documentation Precautions:  Precautions Precautions: Fall,Cervical Precaution Comments: s/p ACDF C4-5 (no cervical brace needed per acute), BUE/LE weakness Restrictions Weight Bearing Restrictions: No    Pain:  Pt denies pain this morning   Therapy/Group: Individual Therapy  [name] 08/09/2020, 8:02 AM

## 2020-08-09 NOTE — Progress Notes (Signed)
Physical Therapy Session Note  Patient Details  Name: Yvette Phillips MRN: [id number] Date of Birth: 23-Sep-1960  Today's Date: 08/09/2020 PT Individual Time: 1007-1100 PT Individual Time Calculation (min): 53 min   Short Term Goals: Week 5:  PT Short Term Goal 1 (Week 5): Pt will perform bed mobility with supervision consistently PT Short Term Goal 2 (Week 5): Pt will walk x 150 ft with CGA and LRAD. PT Short Term Goal 3 (Week 5): Pt will ascend and desend 4 x 6" steps with min A consistently.  Skilled Therapeutic Interventions/Progress Updates:    Pt received sitting in recliner and agreeable to therapy session. Sit<>stands using RW with CGA for steadying throughout session. Gait training ~128ft using RW with CGA/min assist - demos decreased gait speed with decreased B LE step lengths and R LE foot clearance along with narrow BOS - +2 providing w/c follow in event of fatigue. Transported remainder of distance to therapy gym in w/c. Discussed home entry stairs with pt reporting she cannot reach both handrails at same time - educated on side stepping technique using B UE support on 1 HR. Ascended/descended 4 steps using R HR with mod assist of 1 and +2 assist for safety - at top of steps pt started to have L knee buckle but was able to recover into standing with mod assist of 1 and +2 for safety - required therapist facilitating R LE positioning/placement on descent to increase hip abduction and allow space for L foot on step. At the bottom of the steps pt landed with her LEs turned causing a slight twisting moment at the knee with pt having pain - provided seated rest break and seated L LE knee flexion/extension AROM for pain management. Ascended/descended 4 steps using L HR via side stepping technique with mod assist of 1 and +2 for safety - pt lacks sufficient R hip flexor strength to get her foot up onto the step during ascent requiring max assist/facilitation for placement on every step - no knee  buckling noticed this time. Discussed pt continuing to focus on stair navigation as she says her landlord will not allow her to place a ramp. Dynamic sitting balance task via cross body reaching outside BOS to grasp and then toss bean bags for corn hole - close supervision for safety. Dynamic standing balance task with 1 UE support on RW while completing same task - CGA for safety. Transported back to room. Stand pivot w/c>recliner using RW with CGA for safety. Pt left seated in recliner with needs in reach, chair alarm on, and B LEs elevated.   Therapy Documentation Precautions:  Precautions Precautions: Fall,Cervical Precaution Comments: s/p ACDF C4-5 (no cervical brace needed per acute), BUE/LE weakness Restrictions Weight Bearing Restrictions: No   Pain: Experienced L knee pain when slightly twisting the knee while standing - pt reports she fell on that knee 2x - provided rest and AROM as above for pain management with improvement in symptoms.    Therapy/Group: Individual Therapy  Ginny Forth , PT, DPT, CSRS  08/09/2020, 8:04 AM

## 2020-08-09 NOTE — Progress Notes (Signed)
Physical Therapy Session Note  Patient Details  Name: Yvette Phillips MRN: [id number] Date of Birth: 30-May-1960  Today's Date: 08/09/2020 PT Individual Time: 0800-0829 PT Individual Time Calculation (min): 29 min   Short Term Goals: Week 5:  PT Short Term Goal 1 (Week 5): Pt will perform bed mobility with supervision consistently PT Short Term Goal 2 (Week 5): Pt will walk x 150 ft with CGA and LRAD. PT Short Term Goal 3 (Week 5): Pt will ascend and desend 4 x 6" steps with min A consistently.  Skilled Therapeutic Interventions/Progress Updates:     Pt received sitting upright in recliner, awake and agreeable to therapy - no reports of pain. RN present at bedside for morning medications. Stand<>pivot transfer with minA and RW - requiring minA for powering to rise from recliner and cues for safety approach to w/c. W/c transport for time management to main rehab gym. Gait x149ft with minA and RW - minA for steadying and cues for increasing heel strike bilaterally (no brace's donned due to time constraints). Seated ankle DF raises with bean bag on dorsal side of foot, able to complete partial range of motion bilaterally. W/c transport back to her room for time management. Stand<>pivot with CGA and RW from w/c to recliner - required ++ pillows for support and comfort. She remained seated in recliner with BLE elevated, chair alarm on, needs within reach.  Therapy Documentation Precautions:  Precautions Precautions: Fall,Cervical Precaution Comments: s/p ACDF C4-5 (no cervical brace needed per acute), BUE/LE weakness Restrictions Weight Bearing Restrictions: No General:    Therapy/Group: Individual Therapy  Trystin Hargrove P Casondra Gasca PT 08/09/2020, 7:24 AM

## 2020-08-10 DIAGNOSIS — F1010 Alcohol abuse, uncomplicated: Secondary | ICD-10-CM

## 2020-08-10 LAB — URINE CULTURE: Culture: >=100000 — AB

## 2020-08-10 NOTE — Consult Note (Signed)
Neuropsychological Consultation   Patient:   Yvette Phillips   DOB:   11-25-1960  MR Number:  [id number]  Location:  [place] [address] [place] [id number] [place] [id number] Dept: [place]: [phone]           Date of Service:   08/10/2020  Start Time:   9 AM End Time:   10 AM  Provider/Observer:  Ilean Skill, Psy.D.       Clinical Neuropsychologist       Billing Code/Service: 96158/96159  Chief Complaint:    Yvette Phillips is a 60 year old female with history of fall with progressive weakness.  Patient was admitted to [facility] 12/27-05/26/2020.  Work-up revealed C4-C5 disc herniation with severe spinal stenosis and cord compression, central cord syndrome, hypokalemia as well as mild COVID-19 infection.  Patient was initially set up for outpatient neurosurgical follow-up but continued to have bilateral upper extremity/bilateral lower extremity weakness and sustained a significant fall the evening before most recent admission.  Is reported that the patient's boyfriend put her to bed and called EMS the next day.  Patient was admitted to [facility] on 07/03/2020.  Urine drug screen was positive for cocaine and THC.  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  MRI C-spine showed large central disc protrusion at C4-5 with severe spinal stenosis and compressive myelopathy as well as severe right C4-C5 foraminal stenosis.  Patient was taken to the OR on 07/04/2020 for ACDF of C4/C5.  Hospital course has been complicated by diffuse weakness as well as difficulty with swallowing.  Patient with continued motor deficits lower extremity as well as weakness in upper extremities with difficulty standing and staggering gait.  Patient had initial significant fear around touching the ground with her feet as she had had falls and unable to get up for some time prior.  Patient's  fear around putting her feet on the ground has significantly improved with continued therapies and she denies this is a major issue at this point.  Patient has a history of significant polysubstance abuse.  She admits to intermittently but chronic use of cocaine as well as regular use of marijuana.  Patient also acknowledges history of alcohol abuse.  Patient reports that she has not had significant cravings or other withdrawal symptoms during her hospitalization.  08/10/2020 patient has been on the comprehensive inpatient rehab unit for some time now.  She continues to have significant neurological deficits from her spinal cord injury.  Patient has limited resources and there will be significant challenges with her discharge have benefits and services that she needs.  Reason for Service:  Patient was referred for neuropsychological consultation due to coping adjustment issues as well as prior history of substance abuse.  Patient also with significant fears around touching the ground and other issues related to potential and fear of falling.  Below is the HPI for the current admission.  HPI: Yvette Phillips is a 60 year old female with history of fall around christmas with progressive weakness and admitted [facility] 12/27- 05/26/20 with work up revealing C4/C5 disc herniation with severe spinal stenosis and cord compression, central cord syndrome, hypokalemia as well as mild Covid 19 infection.  History taken from chart review and patient.  She was set for outpatient neurosurgery follow up but continued to have BUE/BLE weakness, sustained a fall the evening before.  Her boyfriend put her to bed and called EMS the  next day.  She was found after a well check and admitted to [facility] on 07/03/2020.  Urine drug screen positive cocaine and THC.  She was found to have electrolyte abnormality with potassium 3.4, Mg 1.5 and CK-375.  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 MRI C spine showed large central  disc protrusion at C4-5 with severe spinal stenosis and compressive myelopathy as well as severe right C4/C5 foraminal stenosis. Right knee contusion noted and X rays negative for fracture.    Neurosurgery evaluated patient with exam revealing sensory deficits bilateral hands below forearm and bilateral lower extremities as well as 1/5 strength bilateral grip/wrist extension and 4/5 left biceps/triceps/iliopsoas/quads. MAPs. >80s recommended and she was taken to OR on 07/04/2020 for ACDF of C4/C5  by  Dr.  Izora Ribas.  Hospital course further complicated by resulting post op diffuse weakness a s well as difficulty with swallowing.  Barium swallow done 02/11 due to difficulty swallowing and was negative for mas, stricture or ulceration. She was evaluated by ST and started on dysphagia 2, thins recommended with assistance for feeding. Therapy ongoing and patient limited by BLE/BUE weakness with difficulty standing and staggering gait, is unable to feed herself or perform ADLs. CIR recommended due to functional decline.  Please see preadmission assessment from earlier today as well.  Current Status:  The patient was alert and oriented and sitting up in her wheelchair was entered the room.  Patient's affect was generally flat denied significant depression or anxiety but there was clear dysphoric/depressed symptoms present.  Patient is in a fairly vulnerable situation with limited resources and there are significant challenges to be met upon discharge.  Patient reports that she has not had cravings for cocaine or other substances recently but this will likely be a new or recurrent challenge for her in the future.  Behavioral Observation: Yvette Phillips  presents as a 60 y.o.-year-old Right African American Female who appeared her stated age. her dress was Appropriate and she was Well Groomed and her manners were Appropriate to the situation.  her participation was indicative of Appropriate and Redirectable behaviors.   There were physical disabilities noted.  she displayed an appropriate level of cooperation and motivation.     Interactions:    Active Appropriate and Redirectable  Attention:   abnormal and attention span appeared shorter than expected for age  Memory:   within normal limits; recent and remote memory intact  Visuo-spatial:  not examined  Speech (Volume):  low  Speech:   normal; normal  Thought Process:  Coherent and Relevant  Though Content:  WNL; not suicidal and not homicidal  Orientation:   person, place, time/date and situation  Judgment:   Fair  Planning:   Poor  Affect:    Appropriate  Mood:    Euthymic  Insight:   Fair  Intelligence:   low  Substance Use:  There is a documented history of alcohol, cocaine and marijuana abuse confirmed by the patient.    Medical History:  History reviewed. No pertinent past medical history.       Patient Active Problem List   Diagnosis Date Noted  . Neurogenic bladder 07/16/2020  . Neurogenic bowel 07/16/2020  . 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 HNP (herniated nucleus pulposus) with myelopathy, cervical   . S/P cervical spinal fusion   . Acute blood loss anemia   . Macrocytosis   . Dysphagia, pharyngoesophageal phase   .  Pressure injury of skin 07/05/2020  . Nicotine dependence, cigarettes, uncomplicated 76/19/5093  . Hypokalemia 07/04/2020  . Hypomagnesemia 07/04/2020  . Polysubstance abuse ([place]) 07/03/2020  . Central cord syndrome ([place]) 05/21/2020  . Alcohol abuse 05/21/2020     Psychiatric History:  Patient denies any prior history of psychiatric diagnosis or treatment other than significant history of polysubstance abuse, alcohol abuse and regular marijuana use along with tobacco.  Family Med/Psych History:  Family History  Problem Relation Age of Onset  . Diabetes Mother   . Diabetes Father   . Heart disease Neg Hx     Impression/DX:  Yvette Phillips is a 60 year old female with history of fall with progressive weakness.  Patient was admitted to [facility] 12/27-05/26/2020.  Work-up revealed C4-C5 disc herniation with severe spinal stenosis and cord compression, central cord syndrome, hypokalemia as well as mild COVID-19 infection.  Patient was initially set up for outpatient neurosurgical follow-up but continued to have bilateral upper extremity/bilateral lower extremity weakness and sustained a significant fall the evening before most recent admission.  Is reported that the patient's boyfriend put her to bed and called EMS the next day.  Patient was admitted to [facility] on 07/03/2020.  Urine drug screen was positive for cocaine and THC.  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  MRI C-spine showed large central disc protrusion at C4-5 with severe spinal stenosis and compressive myelopathy as well as severe right C4-C5 foraminal stenosis.  Patient was taken to the OR on 07/04/2020 for ACDF of C4/C5.  Hospital course has been complicated by diffuse weakness as well as difficulty with swallowing.  Patient with continued motor deficits lower extremity as well as weakness in upper extremities with difficulty standing and staggering gait.  Patient had initial significant fear around touching the ground with her feet as she had had falls and unable to get up for some time prior.  Patient's fear around putting her feet on the ground has significantly improved with continued therapies and she denies this is a major issue at this point.  Patient has a history of significant polysubstance abuse.  She admits to intermittently but chronic use of cocaine as well as regular use of marijuana.  Patient also acknowledges history of alcohol abuse.  Patient reports that she has not had significant cravings or other withdrawal symptoms during her hospitalization.   The patient was alert and oriented and sitting up in  her wheelchair was entered the room.  Patient's affect was generally flat denied significant depression or anxiety but there was clear dysphoric/depressed symptoms present.  Patient is in a fairly vulnerable situation with limited resources and there are significant challenges to be met upon discharge.  Patient reports that she has not had cravings for cocaine or other substances recently but this will likely be a new or recurrent challenge for her in the future.  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 Clinical Neuropsychologist

## 2020-08-10 NOTE — NC FL2 (Signed)
[place] MEDICAID FL2 LEVEL OF CARE SCREENING TOOL     IDENTIFICATION  Patient Name: Yvette Phillips Birthdate: 1960/10/05 Sex: female Admission Date (Current Location): 07/11/2020  [facility] and [place] Number:  Producer, television/film/video and Address:  [facility] [place], [address] [place], [place] [address]      Provider Number: [id number]  Attending Physician Name and Address:  Genice Rouge, MD  Relative Name and Phone Number:  Shelbie Hutching- [phone]    Current Level of Care: Hospital Recommended Level of Care: Nursing Facility Prior Approval Number:    Date Approved/Denied:   PASRR Number:    Discharge Plan: SNF    Current Diagnoses: Patient Active Problem List   Diagnosis Date Noted  . Neurogenic bladder 07/16/2020  . Neurogenic bowel 07/16/2020  . 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 HNP (herniated nucleus pulposus) with myelopathy, cervical   . S/P cervical spinal fusion   . Acute blood loss anemia   . Macrocytosis   . Dysphagia, pharyngoesophageal phase   . Pressure injury of skin 07/05/2020  . Nicotine dependence, cigarettes, uncomplicated 07/04/2020  . Hypokalemia 07/04/2020  . Hypomagnesemia 07/04/2020  . Polysubstance abuse ([facility]) 07/03/2020  . Central cord syndrome ([facility]) 05/21/2020  . Alcohol abuse 05/21/2020    Orientation RESPIRATION BLADDER Height & Weight     Self,Time,Situation,Place  Normal Continent (intermittent incontinence with urgency especially at night) Weight: 180 lb 5.4 oz (81.8 kg) Height:  5\' 7"  (170.2 cm)  BEHAVIORAL SYMPTOMS/MOOD NEUROLOGICAL BOWEL NUTRITION STATUS      Continent (intermittent incontinence with urgency especially at night) Diet (D3 with thin liquids)  AMBULATORY STATUS COMMUNICATION OF NEEDS Skin   Extensive Assist Verbally Normal,Other (Comment) (barrier cream on buttocks; ucerine cream on toes and heels)                        Personal Care Assistance Level of Assistance  Bathing,Dressing Bathing Assistance: Limited assistance   Dressing Assistance: Limited assistance     Functional Limitations Info             SPECIAL CARE FACTORS FREQUENCY  PT (By licensed PT),OT (By licensed OT)     PT Frequency: 5xs per week OT Frequency: 5xs per week            Contractures Contractures Info: Not present    Additional Factors Info  Code Status,Allergies Code Status Info: Full Allergies Info: Codeine, penicillin           Current Medications (08/10/2020):  This is the current hospital active medication list Current Facility-Administered Medications  Medication Dose Route Frequency Provider Last Rate Last Admin  . (feeding supplement) PROSource Plus liquid 30 mL  30 mL Oral BID BM Love, 08/12/2020, PA-C   30 mL at 08/10/20 0922  . acetaminophen (TYLENOL) tablet 325-650 mg  325-650 mg Oral Q4H PRN 08/12/20, PA-C   650 mg at 08/09/20 2058  . alum & mag hydroxide-simeth (MAALOX/MYLANTA) 200-200-20 MG/5ML suspension 30 mL  30 mL Oral Q4H PRN Love, Pamela S, PA-C      . ammonium lactate (LAC-HYDRIN) 12 % lotion   Topical BID 02-21-1986, PA-C   1 application at 08/10/20 0908  . baclofen (LIORESAL) tablet 15 mg  15 mg Oral QID Lovorn, 08/12/20, MD   15 mg at 08/10/20 0905  . bisacodyl (DULCOLAX) suppository 10 mg  10 mg Rectal  Daily PRN Lovorn, Aundra Millet, MD      . darifenacin (ENABLEX) 24 hr tablet 7.5 mg  7.5 mg Oral Daily Marcello Fennel, MD   7.5 mg at 08/10/20 0903  . diphenhydrAMINE (BENADRYL) 12.5 MG/5ML elixir 12.5-25 mg  12.5-25 mg Oral Q6H PRN Love, Evlyn Kanner, PA-C      . DULoxetine (CYMBALTA) DR capsule 90 mg  90 mg Oral QHS Lovorn, Megan, MD   90 mg at 08/09/20 2100  . famotidine (PEPCID) tablet 20 mg  20 mg Oral QHS Jacquelynn Cree, PA-C   20 mg at 08/09/20 2101  . folic acid (FOLVITE) tablet 1 mg  1 mg Oral Daily Love, Evlyn Kanner, PA-C   1 mg at 08/10/20 5364  . Gerhardt's butt cream   Topical  TID Genice Rouge, MD   Given at 08/10/20 0912  . guaiFENesin-dextromethorphan (ROBITUSSIN DM) 100-10 MG/5ML syrup 5-10 mL  5-10 mL Oral Q6H PRN Love, Pamela S, PA-C      . hydrocerin (EUCERIN) cream   Topical BID Jacquelynn Cree, [place]   Given at 08/10/20 6803  . lidocaine (XYLOCAINE) 2 % jelly   Topical PRN Love, Pamela S, PA-C      . multivitamin with minerals tablet 1 tablet  1 tablet Oral Daily Jacquelynn Cree, PA-C   1 tablet at 08/10/20 0905  . nicotine (NICODERM CQ - dosed in mg/24 hours) patch 14 mg  14 mg Transdermal Daily Jacquelynn Cree, PA-C   14 mg at 08/10/20 2122  . polyethylene glycol (MIRALAX / GLYCOLAX) packet 17 g  17 g Oral BID Jacquelynn Cree, PA-C   17 g at 08/10/20 0905  . polyethylene glycol (MIRALAX / GLYCOLAX) packet 17 g  17 g Oral Daily PRN Love, Pamela S, PA-C      . prochlorperazine (COMPAZINE) tablet 5-10 mg  5-10 mg Oral Q6H PRN Jacquelynn Cree, PA-C   5 mg at 07/12/20 2248   Or  . prochlorperazine (COMPAZINE) injection 5-10 mg  5-10 mg Intramuscular Q6H PRN Love, Pamela S, PA-C       Or  . prochlorperazine (COMPAZINE) suppository 12.5 mg  12.5 mg Rectal Q6H PRN Love, Pamela S, PA-C      . senna-docusate (Senokot-S) tablet 2 tablet  2 tablet Oral QHS Marcello Fennel, MD   2 tablet at 08/09/20 2100  . sodium phosphate (FLEET) 7-19 GM/118ML enema 1 enema  1 enema Rectal Once PRN Love, Pamela S, PA-C      . thiamine tablet 100 mg  100 mg Oral Daily Jacquelynn Cree, PA-C   100 mg at 08/10/20 0915  . traZODone (DESYREL) tablet 25-50 mg  25-50 mg Oral QHS PRN Jacquelynn Cree, PA-C   50 mg at 08/01/20 2003  . white petrolatum (VASELINE) gel   Topical BID Genice Rouge, MD   0.2 application at 08/10/20 4825     Discharge Medications: Please see discharge summary for a list of discharge medications.  Relevant Imaging Results:  Relevant Lab Results:   Additional Information SS#: [id number]  Gretchen Short, LCSW

## 2020-08-10 NOTE — Progress Notes (Signed)
Physical Therapy Session Note  Patient Details  Name: Yvette Phillips MRN: [id number] Date of Birth: 02-03-1961  Today's Date: 08/10/2020 PT Individual Time: 1100-1158 PT Individual Time Calculation (min): 58 min   Short Term Goals: Week 5:  PT Short Term Goal 1 (Week 5): Pt will perform bed mobility with supervision consistently PT Short Term Goal 2 (Week 5): Pt will walk x 150 ft with CGA and LRAD. PT Short Term Goal 3 (Week 5): Pt will ascend and desend 4 x 6" steps with min A consistently.  Skilled Therapeutic Interventions/Progress Updates:    Handoff of care from OT with patient sitting at edge of mat table. Pt without reports of pain. Pt with R AFO and L foot-up brace donned. Sit<>stand with CGA to RW - cues for hand placement. Gait 135ft with CGA/minA and RW - cues for increasing bilateral heel strike and bilateral step length. Seated rest and then instructed on hanging resistive clothes pins to clothes line while standing with RW support - working on BUE [facility], functional reach in standing, and dynamic standing balance - a few times, she was able to use both hands to manipulate clothes pin but needed minA guard for balance while doing this. Needing a few seated rest breaks b/w efforts but she was able to hang >25 clothes pins using pincer grasp in both RUE + LUE. Pt then performed stair training - navigated up/down x4 steps with a heavy modA using 1 hand rail (R hand rail) with lateral step-to pattern. Increased difficulty with descent > ascent and she struggled with foot placement and would often cross her legs while stepping down. Demonstrated the shower chair technique to safely ascend the steps and told her she would need a 2nd person to help maneuver the chair - she reported she didn't feel comfortable trying this and deferred. Returned to her room with totalA in w/c, stand<>pivot with CGA and RW from w/c to EOB. Removed shoes and bracing with totalA for time management - she required minA for  BLE management with HOB flat. Ended session supine in bed with needs in reach and bed alarm on. Pt with flat affect during our session.  Therapy Documentation Precautions:  Precautions Precautions: Fall,Cervical Precaution Comments: s/p ACDF C4-5 (no cervical brace needed per acute), BUE/LE weakness Restrictions Weight Bearing Restrictions: No  Therapy/Group: Individual Therapy  Raya Mckinstry P Lamesha Tibbits PT 08/10/2020, 7:32 AM

## 2020-08-10 NOTE — Progress Notes (Signed)
Occupational Therapy Session Note  Patient Details  Name: Yvette Phillips MRN: [id number] Date of Birth: [birth year]  Today's Date: 08/10/2020 OT Individual Time: 1000-1100 OT Individual Time Calculation (min): 60 min    Short Term Goals: Week 5:  OT Short Term Goal 1 (Week 5): STG=LTG 2/2 ELOS  Skilled Therapeutic Interventions/Progress Updates:    Pt received in wc agreeable to therapy.  She stated she did not need to change clothing but was willing to try to toilet.  Pt needed min A for first stand from wc as she kept her feet forward slightly. Cued her to bring feet back and she was then able to stand with light CGA.   Ambulated to toilet and transferred to toilet with CGA.  Able to pull pants down. Her incontinence brief was wet, but she had a BM on the toilet (documented in flowsheets).   Pt is not able to cleanse from sitting due to difficulty wt shifting but can stand and partially cleanse.  She has difficulty with R shoulder extension and internal rotation to be able to have more strength to reach.  Pt able to pull her pants up.  Pt ambulated out to sink to wash hands. Standing at sink and as she finished she had a minor LOB forward as she had her knees and hips locked. Demonstrated to pt the need to work on reaching out of base of support with slightly flexed hips and knees to increase BOS.  Used wc to transport to gym and pt transferred to mat with RW.  On mat focused on R shoulder strength for sh extension and int rotation with light yellow theraband and sliding arm on mat out to side and slightly behind. Cues for upright posture as pt has forward shoulders which limits ROM.  Hand off to PT for next session.    Therapy Documentation Precautions:  Precautions Precautions: Fall,Cervical Precaution Comments: s/p ACDF C4-5 (no cervical brace needed per acute), BUE/LE weakness Restrictions Weight Bearing Restrictions: No       Pain: no c/o pain   ADL: ADL Equipment Provided:  Feeding equipment Eating: Not assessed (not fully assessed, does have built up handles and able to simulate) Grooming: Moderate assistance Upper Body Bathing: Minimal assistance Where Assessed-Upper Body Bathing: Sitting at sink Lower Body Bathing: Maximal assistance Where Assessed-Lower Body Bathing: Sitting at sink,Standing at sink Upper Body Dressing: Moderate assistance Lower Body Dressing: Dependent Where Assessed-Lower Body Dressing: Sitting at sink,Standing at sink Toileting: Not assessed Toilet Transfer: Maximal assistance  Therapy/Group: Individual Therapy  [name] 08/10/2020, 12:22 PM

## 2020-08-10 NOTE — Progress Notes (Addendum)
Patient ID: Yvette Phillips, female   DOB: 18-Dec-1960, 60 y.o.   MRN: [id number]  SW spoke with [name] APS caseworker [phone] cell) to follow-up about possible options for pt. Reports that she has found options for pt, but will call back as she was headed out of the office.   PASRR #: [id number]  *SW returned phone call/left message for [name] caseworker and waiting on follow-up.  Update- SW spoke with [name] to discuss patient discharge needs. SW informed pt currently Min A level of care and will have more updates on pt progress after team conference on Tuesday. SW informed on current DME needs. States pt is able to return to her home as she spoke with patient's landlord. She also stated there will be a home assessment next week to determine if the home is habitable. States they will cover cost of patient needs based on our recommendations. If pt will require 24/7 care, pt will likely be required to go to SNF versus having an aide in the home with additional therapies. SNF placement will be covered if needed for patient. SW to follow-up next week.   Cecile Sheerer, MSW, LCSWA Office: [phone] Cell: [phone] Fax: [phone]

## 2020-08-10 NOTE — Plan of Care (Signed)
  Problem: Consults Goal: RH SPINAL CORD INJURY PATIENT EDUCATION Description:  See Patient Education module for education specifics.  Outcome: Progressing Goal: Skin Care Protocol Initiated - if Braden Score 18 or less Description: If consults are not indicated, leave blank or document N/A Outcome: Progressing   Problem: SCI BOWEL ELIMINATION Goal: RH STG MANAGE BOWEL WITH ASSISTANCE Description: STG Manage Bowel with Mod I Assistance. Outcome: Progressing Goal: RH STG SCI MANAGE BOWEL WITH MEDICATION WITH ASSISTANCE Description: STG SCI Manage bowel with medication with Mod I assistance. Outcome: Progressing   Problem: SCI BLADDER ELIMINATION Goal: RH STG MANAGE BLADDER WITH ASSISTANCE Description: STG Manage Bladder With Mod I Assistance Outcome: Progressing   Problem: RH SAFETY Goal: RH STG ADHERE TO SAFETY PRECAUTIONS W/ASSISTANCE/DEVICE Description: STG Adhere to Safety Precautions With Mod I Assistance/Device. Outcome: Progressing   Problem: RH PAIN MANAGEMENT Goal: RH STG PAIN MANAGED AT OR BELOW PT'S PAIN GOAL Description: <3 on a 0-10 pain scale. Outcome: Progressing   Problem: RH KNOWLEDGE DEFICIT SCI Goal: RH STG INCREASE KNOWLEDGE OF SELF CARE AFTER SCI Description: Patient will demonstrate knowledge of medication management, bowel and bladder care, skin and wound care, dietary management with educational materials and handouts provided by staff. Outcome: Progressing   

## 2020-08-10 NOTE — Progress Notes (Signed)
PROGRESS NOTE   Subjective/Complaints:  Reports B/B are the same- LBM was last evening.   Feels stronger last 1-2 days. Since MRI done.    ROS:   Pt denies SOB, abd pain, CP, N/V/C/D, and vision changes   Objective:   MR CERVICAL SPINE WO CONTRAST  Result Date: 08/09/2020 CLINICAL DATA:  Previous spinal cord injury.  Worsening weakness. EXAM: MRI CERVICAL SPINE WITHOUT CONTRAST TECHNIQUE: Multiplanar, multisequence MR imaging of the cervical spine was performed. No intravenous contrast was administered. COMPARISON:  07/03/2020 FINDINGS: Alignment: Normal Vertebrae: Interval ACDF C4-5. Cord: There is no longer any active cord compression at the C4-5 level following ACDF. Cord volume loss and gliosis is present at this level. Less cord edema emanating out from that level when compared to the study of 5 weeks ago. Posterior Fossa, vertebral arteries, paraspinal tissues: Negative Disc levels: Foramen magnum is widely patent. 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 C2-3 and C3-4: Normal C4-5: As above, interval ACDF. Good decompression of the central canal. Subarachnoid space now surrounds the cord without ongoing cord compression. Myelomalacia at this level with cord volume loss and gliotic type signal. There is no longer any active cord edema emanating out from this level. C5-6: No disc pathology or central canal stenosis. Mild bony foraminal narrowing on the left, not likely compressive. C6-7: Normal C7-T1: Normal IMPRESSION: 1. Interval ACDF at C4-5. Good decompression of the central canal. There is no longer any ongoing cord compression. Myelomalacia at this level with cord volume loss and gliotic type signal. Less cord edema emanating out from that level when compared to the preoperative study of 5 weeks ago. 2. Mild bony foraminal narrowing on the left at C5-6, not likely  compressive. Otherwise normal. Electronically Signed   By: Paulina Fusi M.D.   On: 08/09/2020 08:14   No results for input(s): WBC, HGB, HCT, PLT in the last 72 hours. No results for input(s): NA, K, CL, CO2, GLUCOSE, BUN, CREATININE, CALCIUM in the last 72 hours.  Intake/Output Summary (Last 24 hours) at 08/10/2020 0846 Last data filed at 08/10/2020 0841 Gross per 24 hour  Intake 354 ml  Output --  Net 354 ml        Physical Exam: Vital Signs Blood pressure 130/71, pulse 99, temperature 98.2 F (36.8 C), temperature source Oral, resp. rate 16, height 5\' 7"  (1.702 m), weight 81.8 kg, SpO2 95 %.      General: awake, alert, appropriate, sitting up in bed- tray on table- >75% eaten,  NAD HENT: conjugate gaze; oropharynx moist CV: regular rate and rhythm; no JVD Pulmonary: CTA B/L; no W/R/R- good air movement GI: soft, NT, ND, (+)BS Psychiatric: appropriate- but sad Neurological: Ox3- MAS of 1+- finger more curled due to spasticity- notable this AM Skin: Warm and dry.  Intact. No skin breakdown on backside.  Musc: No edema in extremities.  No tenderness in extremities. Motor: Bilateral upper extremities: 4 -/5 proximal distal- UEs- today  Are back to 4-/5 B/L Right lower extremity: Hip flexion, knee extension 2+/5, ankle dorsiflexion 4/5, stable Left lower extremity: Hip flexion, knee extension 3 -/5, ankle dorsiflexion 4/5, stable  Assessment/Plan: 1. Functional deficits which require 3+ hours per day of interdisciplinary therapy in a comprehensive inpatient rehab setting.  Physiatrist is providing close team supervision and 24 hour management of active medical problems listed below.  Physiatrist and rehab team continue to assess barriers to discharge/monitor patient progress toward functional and medical goals  Care Tool:  Bathing    Body parts bathed by patient: Right arm,Left arm,Chest,Abdomen,Front perineal area,Buttocks,Right upper leg,Left upper leg,Face   Body parts  bathed by helper: Right lower leg,Left lower leg Body parts n/a: Left lower leg,Right lower leg   Bathing assist Assist Level: Minimal Assistance - Patient > 75%     Upper Body Dressing/Undressing Upper body dressing   What is the patient wearing?: Pull over shirt    Upper body assist Assist Level: Set up assist    Lower Body Dressing/Undressing Lower body dressing      What is the patient wearing?: Incontinence brief,Underwear/pull up     Lower body assist Assist for lower body dressing: Moderate Assistance - Patient 50 - 74%     Toileting Toileting    Toileting assist Assist for toileting: Moderate Assistance - Patient 50 - 74%     Transfers Chair/bed transfer  Transfers assist     Chair/bed transfer assist level: Contact Guard/Touching assist Chair/bed transfer assistive device: Walker,Armrests   Locomotion Ambulation   Ambulation assist   Ambulation activity did not occur: Safety/medical concerns  Assist level: Contact Guard/Touching assist Assistive device: Walker-rolling Max distance: 174ft   Walk 10 feet activity   Assist  Walk 10 feet activity did not occur: Safety/medical concerns  Assist level: Contact Guard/Touching assist Assistive device: Walker-rolling   Walk 50 feet activity   Assist Walk 50 feet with 2 turns activity did not occur: Safety/medical concerns  Assist level: Contact Guard/Touching assist Assistive device: Walker-rolling    Walk 150 feet activity   Assist Walk 150 feet activity did not occur: Safety/medical concerns  Assist level: Minimal Assistance - Patient > 75% Assistive device: Walker-rolling    Walk 10 feet on uneven surface  activity   Assist Walk 10 feet on uneven surfaces activity did not occur: Safety/medical concerns         Wheelchair     Assist Will patient use wheelchair at discharge?: Yes Type of Wheelchair: Manual    Wheelchair assist level: Supervision/Verbal cueing Max  wheelchair distance: 190    Wheelchair 50 feet with 2 turns activity    Assist        Assist Level: Minimal Assistance - Patient > 75%   Wheelchair 150 feet activity     Assist      Assist Level: Minimal Assistance - Patient > 75%   Blood pressure 130/71, pulse 99, temperature 98.2 F (36.8 C), temperature source Oral, resp. rate 16, height 5\' 7"  (1.702 m), weight 81.8 kg, SpO2 95 %.  Medical Problem List and Plan: 1.  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  Continue CIR  3/16- will order B/L AFOs consult for pt due to B/L foot drop  3/18- pt progressing- is stronger today- con't PT and OT 2.  Antithrombotics:  -DVT/anticoagulation:  Pharmaceutical: Continue Lovenox  2/17- Very high risk of DVT due to SCI-will need 2-3 months of Lovenox per CHEST guidelines.              -antiplatelet therapy: N/A 3. Pain Management: Continue Oxycodone prn.  Off Lyrica- on Duloxetine 60  mg QHS, increase to duloxetine to 90 mg QHS since can't do Gabapentin or Lyrica.   3/15- pt reports pain better controlled-con't regimen  3/16- is really only taking tylenol prn now- no opiates- con't regimen             Monitor with increased exertion, particularly neuropathic pain 4. Mood: LCSW to follow for evaluation and support.   Appreciate neuropsych follow-up             -antipsychotic agents: N/A 5. Neuropsych: This patient is capable of making decisions on her own behalf. 6. Skin/Wound Care:  Monitor incision for healing.  Added protein supplement to help promote healing.  7. Fluids/Electrolytes/Nutrition: Monitor I/Os.  8. ABLA/macrocytosis:   Vitamin B12 ordered for tomorrow 9. Hypokalemia/Hypomagnesemia: Has resolved with brief supplementation.              potassium 4.0 on 3/7  3/15- K+ 4.0 yesterday -con't to monitor  10. Polysubstance abuse:              Vitamin B12/folate levels  ordered.   Continue to counsel  11. Neurogenic bowel and constipation: KUB ordered to determine stool burden.  Bowel program with dig stim nightly   Added Senokot 1 tab, changed to senna S2 tabs nightly on 3/13  3/15- going daily 1-2x/day- con't regimen  3/17- con't PO bowel meds, not on bowel program 12. Neurogenic bladder  Some improvement with timed voiding, however remains incontinent  3/14- says is dry during the day- but not at night- per pt.   3/15- says a little better with Enablex- con't regimen  3/17- is wet at night, but usually timed voiding works during day.   See #18 13. Neuropathic pain  Lyrica increased Lyrica to 75 mg TID, then DC'd due to edema  Controlled on 3/13 14. Spasticity  3/17- will increase baclofen to 15 mg QID since spasms aworse at night- might need to increase night time dose.   3/18- Spasms doing slightly better per pt- hard ot tell since she's an under-reportor- con't regimen- could give higher dose of baclofen at night.  15. Dry skin: Eucerin ordered, encouraged use.  16. B/L elbow/arm swelling  Improved off of Lyrica 17. Dysphagia  D 3 thins, continue to advance as tolerated 18. Urinary urgency/frequency  Trial Myrbetriq 25 mg QHS for urinary frequency,?  DC'd on 2/25  Added Flomax,?  DC'd on 3/2  Trial Enablex on 3/13  3/14- no change so far, but is normal to take a few days  3/15- slightly better per pt- con't regimen  See #13 20. Severe calluses on Feet  2/27- feet warmed with warm wet washcloth, then lotion applied, then vaseline over that BID- to help cracks/calluses.   21. New UE weakness  3/16- will order cervical MRI- since doesn't have UTI/a reason other than neck that could be causing increased UE weakness.  3/17- not sure reason for increased weakness- pt's cervical MRI looks better- no compression and less edema- if persists, will call NSU again.  3/18- doing better- con't regimen of therapy and con't to monitor.     LOS: 30  days A FACE TO FACE EVALUATION WAS PERFORMED  Yvette Phillips 08/10/2020, 8:46 AM

## 2020-08-10 NOTE — Progress Notes (Signed)
Physical Therapy Session Note  Patient Details  Name: Yvette Phillips MRN: [id number] Date of Birth: 10-11-1960  Today's Date: 08/10/2020 PT Individual Time: 9038-3338 PT Individual Time Calculation (min): 56 min   Short Term Goals: Week 5:  PT Short Term Goal 1 (Week 5): Pt will perform bed mobility with supervision consistently PT Short Term Goal 2 (Week 5): Pt will walk x 150 ft with CGA and LRAD. PT Short Term Goal 3 (Week 5): Pt will ascend and desend 4 x 6" steps with min A consistently.  Skilled Therapeutic Interventions/Progress Updates:     Pt received supine in bed and agrees to therapy. No complaint of pain. Supine to sit with modA and cues for sequencing and logrolling. PT threads pants over pt's legs and pt performs sit to stand with CGA. Pt requires minA during stand step transfer to [facility] with RW due to buckling in R knee and losing balance. Pt then performs stand step transfer to toilet with RW and minA. Pt performs pericare with supervision. WC transport to gym for time management. Pt performs strengthening and balance activities in parallel bars. Pt initially perform standing marches 2x20 with CGA. For second set PT provides tactile assistance for full hip flexion ROM with cues to activate hip flexors. Pt then performs 2x20 standing hamstring curls with cues for mechanics. Pt performs 2x20 minisquats with emphasis on glute engagement and trunk extension. Pt left seated in WC with alarm intact and all needs within reach.  Therapy Documentation Precautions:  Precautions Precautions: Fall,Cervical Precaution Comments: s/p ACDF C4-5 (no cervical brace needed per acute), BUE/LE weakness Restrictions Weight Bearing Restrictions: No   Therapy/Group: Individual Therapy  Beau Fanny, PT, DPT 08/10/2020, 4:16 PM

## 2020-08-11 NOTE — Progress Notes (Addendum)
PROGRESS NOTE   Subjective/Complaints:  Up in bed feeding self breakfast with red foam grips on utencils. No new complaints.   ROS: Patient denies fever, rash, sore throat, blurred vision, nausea, vomiting, diarrhea, cough, shortness of breath or chest pain,  headache, or mood change.   Objective:   No results found. No results for input(s): WBC, HGB, HCT, PLT in the last 72 hours. No results for input(s): NA, K, CL, CO2, GLUCOSE, BUN, CREATININE, CALCIUM in the last 72 hours.  Intake/Output Summary (Last 24 hours) at 08/11/2020 0915 Last data filed at 08/11/2020 0700 Gross per 24 hour  Intake 595 ml  Output --  Net 595 ml        Physical Exam: Vital Signs Blood pressure 133/74, pulse 78, temperature 97.8 F (36.6 C), temperature source Oral, resp. rate 14, height 5\' 7"  (1.702 m), weight 81.8 kg, SpO2 98 %.      Constitutional: No distress . Vital signs reviewed. HEENT: EOMI, oral membranes moist Neck: supple Cardiovascular: RRR without murmur. No JVD    Respiratory/Chest: CTA Bilaterally without wheezes or rales. Normal effort    GI/Abdomen: BS +, non-tender, non-distended Ext: no clubbing, cyanosis, or edema Psych: pleasant and cooperative Neurological: Ox3- MAS of 1+ hands Skin: Warm and dry.  Intact. No skin breakdown on backside.  Musc: No edema in extremities.  No tenderness in extremities. Motor: Bilateral upper extremities: 4 -/5 proximal distal- UEs- today  Are back to 4-/5 B/L Right lower extremity: Hip flexion, knee extension 2+/5, ankle dorsiflexion 4/5, no changes Left lower extremity: Hip flexion, knee extension 3 -/5, ankle dorsiflexion 4/5, no changes  Assessment/Plan: 1. Functional deficits which require 3+ hours per day of interdisciplinary therapy in a comprehensive inpatient rehab setting.  Physiatrist is providing close team supervision and 24 hour management of active medical problems  listed below.  Physiatrist and rehab team continue to assess barriers to discharge/monitor patient progress toward functional and medical goals  Care Tool:  Bathing    Body parts bathed by patient: Right arm,Left arm,Chest,Abdomen,Front perineal area,Buttocks,Right upper leg,Left upper leg,Face   Body parts bathed by helper: Right lower leg,Left lower leg Body parts n/a: Left lower leg,Right lower leg   Bathing assist Assist Level: Minimal Assistance - Patient > 75%     Upper Body Dressing/Undressing Upper body dressing   What is the patient wearing?: Pull over shirt    Upper body assist Assist Level: Set up assist    Lower Body Dressing/Undressing Lower body dressing      What is the patient wearing?: Incontinence brief,Underwear/pull up     Lower body assist Assist for lower body dressing: Moderate Assistance - Patient 50 - 74%     Toileting Toileting    Toileting assist Assist for toileting: Minimal Assistance - Patient > 75%     Transfers Chair/bed transfer  Transfers assist     Chair/bed transfer assist level: Minimal Assistance - Patient > 75% Chair/bed transfer assistive device:   Ambulation assist   Ambulation activity did not occur: Safety/medical concerns  Assist level: Contact Guard/Touching assist Assistive device: Walker-rolling Max distance: 134ft   Walk 10 feet activity  Assist  Walk 10 feet activity did not occur: Safety/medical concerns  Assist level: Contact Guard/Touching assist Assistive device: Walker-rolling   Walk 50 feet activity   Assist Walk 50 feet with 2 turns activity did not occur: Safety/medical concerns  Assist level: Contact Guard/Touching assist Assistive device: Walker-rolling    Walk 150 feet activity   Assist Walk 150 feet activity did not occur: Safety/medical concerns  Assist level: Minimal Assistance - Patient > 75% Assistive device: Walker-rolling    Walk 10  feet on uneven surface  activity   Assist Walk 10 feet on uneven surfaces activity did not occur: Safety/medical concerns         Wheelchair     Assist Will patient use wheelchair at discharge?: Yes Type of Wheelchair: Manual    Wheelchair assist level: Supervision/Verbal cueing Max wheelchair distance: 190    Wheelchair 50 feet with 2 turns activity    Assist        Assist Level: Minimal Assistance - Patient > 75%   Wheelchair 150 feet activity     Assist      Assist Level: Minimal Assistance - Patient > 75%   Blood pressure 133/74, pulse 78, temperature 97.8 F (36.6 C), temperature source Oral, resp. rate 14, height 5\' 7"  (1.702 m), weight 81.8 kg, SpO2 98 %.  Medical Problem List and Plan: 1.  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  Continue CIR  3/16- will order B/L AFOs consult for pt due to B/L foot drop  -Continue CIR therapies including PT, OT  2.  Antithrombotics:  -DVT/anticoagulation:  Pharmaceutical: Continue Lovenox  2/17- Very high risk of DVT due to SCI-will need 2-3 months of Lovenox per CHEST guidelines.              -antiplatelet therapy: N/A 3. Pain Management: Continue Oxycodone prn.  Off Lyrica- on Duloxetine 60 mg QHS, increase to duloxetine to 90 mg QHS since can't do Gabapentin or Lyrica.   3/15- pt reports pain better controlled-con't regimen  3/19 pain controlled with tylenol 4. Mood: LCSW to follow for evaluation and support.   Appreciate neuropsych follow-up             -antipsychotic agents: N/A 5. Neuropsych: This patient is capable of making decisions on her own behalf. 6. Skin/Wound Care:  Monitor incision for healing.  Added protein supplement to help promote healing.  7. Fluids/Electrolytes/Nutrition: Monitor I/Os.  8. ABLA/macrocytosis:   Vitamin B12 173 9. Hypokalemia/Hypomagnesemia: Has resolved with brief  supplementation.              potassium 4.0 on 3/7  3/15- K+ 4.0 yesterday -con't to monitor  10. Polysubstance abuse:              Vitamin B12/folate levels ordered.   Continue to counsel  11. Neurogenic bowel and constipation: KUB ordered to determine stool burden.  Bowel program with dig stim nightly   Added Senokot 1 tab, changed to senna S2 tabs nightly on 3/13  3/15- going daily 1-2x/day- con't regimen  3/17-19- con't PO bowel meds, not on bowel program, continent 12. Neurogenic bladder  Some improvement with timed voiding, however remains incontinent  3/14- says is dry during the day- but not at night- per pt.   3/15- says a little better with Enablex- con't regimen  3/17- is wet at night, but usually timed voiding works during day.   See #18 13. Neuropathic pain  Lyrica increased Lyrica to 75 mg TID, then DC'd due to edema  Controlled on 3/13 14. Spasticity  3/17- will increase baclofen to 15 mg QID since spasms aworse at night- might need to increase night time dose.   3/18-19- Spasms doing slightly better per pt- hard ot tell since she's an under-reportor- con't regimen-  As appear stable 15. Dry skin: Eucerin ordered, encouraged use.  16. B/L elbow/arm swelling  Improved off of Lyrica 17. Dysphagia  D 3 thins, continue to advance as tolerated 18. Urinary urgency/frequency  Trial Myrbetriq 25 mg QHS for urinary frequency,?  DC'd on 2/25  Added Flomax,?  DC'd on 3/2  Trial Enablex on 3/13  3/14- no change so far, but is normal to take a few days  3/15- slightly better per pt- con't regimen  3/19 check a scan/pvr today  See #13 20. Severe calluses on Feet  2/27- feet warmed with warm wet washcloth, then lotion applied, then vaseline over that BID- to help cracks/calluses.   21. New UE weakness  3/16- will order cervical MRI- since doesn't have UTI/a reason other than neck that could be causing increased UE weakness.  3/17- not sure reason for increased weakness- pt's  cervical MRI looks better- no compression and less edema- if persists, will call NSU again.  3/18-19- doing better- con't regimen of therapy and con't to monitor.     LOS: 31 days A FACE TO FACE EVALUATION WAS PERFORMED  Ranelle Oyster 08/11/2020, 9:15 AM

## 2020-08-11 NOTE — Plan of Care (Signed)
  Problem: Consults Goal: RH SPINAL CORD INJURY PATIENT EDUCATION Description:  See Patient Education module for education specifics.  Outcome: Progressing Goal: Skin Care Protocol Initiated - if Braden Score 18 or less Description: If consults are not indicated, leave blank or document N/A Outcome: Progressing   Problem: SCI BOWEL ELIMINATION Goal: RH STG MANAGE BOWEL WITH ASSISTANCE Description: STG Manage Bowel with Mod I Assistance. Outcome: Progressing Goal: RH STG SCI MANAGE BOWEL WITH MEDICATION WITH ASSISTANCE Description: STG SCI Manage bowel with medication with Mod I assistance. Outcome: Progressing   Problem: SCI BLADDER ELIMINATION Goal: RH STG MANAGE BLADDER WITH ASSISTANCE Description: STG Manage Bladder With Mod I Assistance Outcome: Progressing   Problem: RH SAFETY Goal: RH STG ADHERE TO SAFETY PRECAUTIONS W/ASSISTANCE/DEVICE Description: STG Adhere to Safety Precautions With Mod I Assistance/Device. Outcome: Progressing   Problem: RH PAIN MANAGEMENT Goal: RH STG PAIN MANAGED AT OR BELOW PT'S PAIN GOAL Description: <3 on a 0-10 pain scale. Outcome: Progressing   Problem: RH KNOWLEDGE DEFICIT SCI Goal: RH STG INCREASE KNOWLEDGE OF SELF CARE AFTER SCI Description: Patient will demonstrate knowledge of medication management, bowel and bladder care, skin and wound care, dietary management with educational materials and handouts provided by staff. Outcome: Progressing   

## 2020-08-12 NOTE — Progress Notes (Signed)
Physical Therapy Session Note  Patient Details  Name: Yvette Phillips MRN: [id number] Date of Birth: 1961-02-18  Today's Date: 08/12/2020 PT Individual Time: 1415-1500 PT Individual Time Calculation (min): 45 min   Short Term Goals: Week 5:  PT Short Term Goal 1 (Week 5): Pt will perform bed mobility with supervision consistently PT Short Term Goal 2 (Week 5): Pt will walk x 150 ft with CGA and LRAD. PT Short Term Goal 3 (Week 5): Pt will ascend and desend 4 x 6" steps with min A consistently.  Skilled Therapeutic Interventions/Progress Updates:    Pt received semi reclined in bed and agreeable to therapy. States she is now planning to go home at discharge. Supine>EOB with supervision and VCs for technique. States she needs to use the restroom, ambulates to bathroom with RW and CGA and able to continently void. Assisted with peri care and clothing management for time and thoroughness. Donned pants, socks, and shoes with AFO/foot up in place with total assist for time. Pt performed x 8 3" steps with side step technique using R hand rail mod A and 2 x 4 3" steps with side step technique using L hand rail min A. Pt demonstrated improved safety and technique when using L hand rail. Pt returned to room after session and remained in recliner with all needs in reach.  Therapy Documentation Precautions:  Precautions Precautions: Fall,Cervical Precaution Comments: s/p ACDF C4-5 (no cervical brace needed per acute), BUE/LE weakness Restrictions Weight Bearing Restrictions: No    Therapy/Group: Individual Therapy  Dyane Dustman, SPT 08/12/2020, 3:44 PM

## 2020-08-12 NOTE — Progress Notes (Signed)
Occupational Therapy Session Note  Patient Details  Name: Yvette Phillips MRN: [id number] Date of Birth: Aug 17, 1960  Today's Date: 08/12/2020 OT Individual Time: 1660-6004 OT Individual Time Calculation (min): 60 min    Short Term Goals: Week 1:  OT Short Term Goal 1 (Week 1): Pt will perform self feeding with AE with Supervision OT Short Term Goal 1 - Progress (Week 1): Met OT Short Term Goal 2 (Week 1): Pt will perform sit > stand at AD with no more than Mod A in prep for LB ADL OT Short Term Goal 2 - Progress (Week 1): Progressing toward goal OT Short Term Goal 3 (Week 1): Pt will perform BSC/toilet transfers with no more than Mod of 1 OT Short Term Goal 3 - Progress (Week 1): Progressing toward goal OT Short Term Goal 4 (Week 1): Pt will increase functional grip strength to perform oral hygiene w/ Supervision OT Short Term Goal 4 - Progress (Week 1): Progressing toward goal Week 2:  OT Short Term Goal 1 (Week 2): Pt will perform sit > stand at AD with no more than Mod A in prep for LB ADL OT Short Term Goal 1 - Progress (Week 2): Met OT Short Term Goal 2 (Week 2): Pt will perform BSC/toilet transfers with no more than Mod of 1 OT Short Term Goal 2 - Progress (Week 2): Met OT Short Term Goal 3 (Week 2): Pt will increase functional grip strength to perform oral hygiene w/ Supervision OT Short Term Goal 3 - Progress (Week 2): Progressing toward goal OT Short Term Goal 4 (Week 2): Pt will complete LB dressing with max A OT Short Term Goal 4 - Progress (Week 2): Met  Skilled Therapeutic Interventions/Progress Updates:    Pt in bed when arrived. Wanted to shower today. Self care retraining to including bed mobility with bed rail, functional ambulation with RW to and from the bathroom, toileting, bathing sit to stand with grab bar and dressing. Pt required min A to come to the EOB. Total A for donning socks (unable to cross LEs to don). Ambulated to the bathroom with RW with min A at a very  slow speed. Pt able to perform 2/2 steps for toileting with setup of toilet paper (removed brief after voided). Transition into shower with min  A with cues for RW management. Pt able to shower sit to stand with min A; only requiring A for bathing feet. Pt chose to only don shirt and clean socks and brief afterwards. Once clothing was provided pt able to orient and don. Pt ambulated out to the bed. Donned brief with min A. Pt reports bed at home is very high discussed options for bringing it down to increase her access to it. Pt required A for both LEs into the bed.   Therapy Documentation Precautions:  Precautions Precautions: Fall,Cervical Precaution Comments: s/p ACDF C4-5 (no cervical brace needed per acute), BUE/LE weakness Restrictions Weight Bearing Restrictions: No Pain: No c/o pain   Therapy/Group: Individual Therapy  [name] [facility] 08/12/2020, 12:24 PM

## 2020-08-12 NOTE — Plan of Care (Signed)
Cooperative, able to make needs known.

## 2020-08-13 LAB — BASIC METABOLIC PANEL
Anion gap: 8 (ref 5–15)
BUN: 16 mg/dL (ref 8–23)
CO2: 30 mmol/L (ref 22–32)
Calcium: 9.8 mg/dL (ref 8.9–10.3)
Chloride: 100 mmol/L (ref 98–111)
Creatinine, Ser: 0.72 mg/dL (ref 0.44–1.00)
GFR, Estimated: >60 mL/min (ref >60)
Glucose, Bld: 88 mg/dL (ref 70–99)
Potassium: 4.2 mmol/L (ref 3.5–5.1)
Sodium: 138 mmol/L (ref 135–145)

## 2020-08-13 LAB — CBC
HCT: 38.9 % (ref 36.0–46.0)
Hemoglobin: 13.2 g/dL (ref 12.0–15.0)
MCH: 34.8 pg — ABNORMAL HIGH (ref 26.0–34.0)
MCHC: 33.9 g/dL (ref 30.0–36.0)
MCV: 102.6 fL — ABNORMAL HIGH (ref 80.0–100.0)
Platelets: 340 10*3/uL (ref 150–400)
RBC: 3.79 MIL/uL — ABNORMAL LOW (ref 3.87–5.11)
RDW: 12.6 % (ref 11.5–15.5)
WBC: 8 10*3/uL (ref 4.0–10.5)
nRBC: 0 % (ref 0.0–0.2)

## 2020-08-13 MED ORDER — DANTROLENE SODIUM 25 MG PO CAPS: 50.0000 mg | ORAL_CAPSULE | Freq: Three times a day (TID) | ORAL | Status: DC

## 2020-08-13 MED ORDER — BACLOFEN 5 MG HALF TABLET
15.0000 mg | ORAL_TABLET | Freq: Three times a day (TID) | ORAL | Status: DC
  Administered 2020-08-13 – 2020-08-21 (×23): 15 mg via ORAL
  Filled 2020-08-13 (×23): qty 1

## 2020-08-13 MED ORDER — DANTROLENE SODIUM 25 MG PO CAPS
50.0000 mg | ORAL_CAPSULE | Freq: Every day | ORAL | Status: DC
  Administered 2020-08-13: 50 mg via ORAL
  Filled 2020-08-13: qty 2

## 2020-08-13 MED ORDER — BACLOFEN 20 MG PO TABS
20.0000 mg | ORAL_TABLET | Freq: Every day | ORAL | Status: DC
  Administered 2020-08-13 – 2020-08-20 (×8): 20 mg via ORAL
  Filled 2020-08-13 (×9): qty 1

## 2020-08-13 MED ORDER — DANTROLENE SODIUM 25 MG PO CAPS: 50.0000 mg | ORAL_CAPSULE | Freq: Two times a day (BID) | ORAL | Status: DC

## 2020-08-13 NOTE — Progress Notes (Signed)
Occupational Therapy Session Note  Patient Details  Name: Yvette Phillips MRN: [id number] Date of Birth: 02-18-1961  Today's Date: 08/13/2020 OT Individual Time: 0865-7846 OT Individual Time Calculation (min): 55 min    Short Term Goals: Week 5:  OT Short Term Goal 1 (Week 5): STG=LTG 2/2 ELOS  Skilled Therapeutic Interventions/Progress Updates:    OT intervention with focus on bed mobility, sit<>stand, standing balance, functional amb with RW, toileting, bathing/dressing with sit<>stand from w/c at sink, and safety awareness to incerase independence with BADLs. Supine>sit EOB with HOB elevated-supervision. Pt amb with RW to bathroom for toileting. Pt requires min A for toileting tasks. Pt incontinent of bladder. Pt returned to sink and completed bathing/dressing tasks. Pt requires assistance threading pants completely but is able to initiate task. Pt able to pull pants over hips. Pt stands at sink without BUE support for pulling pants over hips. Pt amb with RW to recliner and sat in recliner. Pt remained in recliner with all needs within reach and seat alarm activated. See Care Tool for assist levels.   Therapy Documentation Precautions:  Precautions Precautions: Fall,Cervical Precaution Comments: s/p ACDF C4-5 (no cervical brace needed per acute), BUE/LE weakness Restrictions Weight Bearing Restrictions: No Pain:  Pt denies pain this morning.    Therapy/Group: Individual Therapy  [name] 08/13/2020, 8:05 AM

## 2020-08-13 NOTE — Progress Notes (Signed)
PROGRESS NOTE   Subjective/Complaints:  Pt reports it's hard to advance legs when walking due to tone/tightness and still having spasms at night- still "a lot"-  Also had another bump pop up behind L ear that popped and another one on L upper eyelid- so it's not a stye.    ROS:  Pt denies SOB, abd pain, CP, N/V/C/D, and vision changes   Objective:   No results found. Recent Labs    08/13/20 0534  WBC 8.0  HGB 13.2  HCT 38.9  PLT 340   Recent Labs    08/13/20 0534  NA 138  K 4.2  CL 100  CO2 30  GLUCOSE 88  BUN 16  CREATININE 0.72  CALCIUM 9.8    Intake/Output Summary (Last 24 hours) at 08/13/2020 1332 Last data filed at 08/13/2020 0900 Gross per 24 hour  Intake 360 ml  Output --  Net 360 ml        Physical Exam: Vital Signs Blood pressure 116/73, pulse 86, temperature 97.9 F (36.6 C), resp. rate 18, height 5\' 7"  (1.702 m), weight 81.8 kg, SpO2 100 %.       General: awake, alert, appropriate, sitting up at sink with OT- bathing, NAD HENT: conjugate gaze; oropharynx moist CV: regular rate and rhythm; no JVD Pulmonary: CTA B/L; no W/R/R- good air movement GI: soft, NT, ND, (+)BS, hypoactive Psychiatric: appropriate- sad but appropriate Neurological: Ox3; MAS of 3 in L hip and knee- MAS of 2 in ankle; MAS of 2 or so in R hip, ankle and knee- no clonus currently.  Skin: Warm and dry.  Intact. No skin breakdown on backside.  Musc: No edema in extremities.  No tenderness in extremities. Motor: Bilateral upper extremities: 4 -/5 proximal distal- UEs- today  Are back to 4-/5 B/L Right lower extremity: Hip flexion, knee extension 2+/5, ankle dorsiflexion 4/5, no changes Left lower extremity: Hip flexion, knee extension 3 -/5, ankle dorsiflexion 4/5, no changes  Assessment/Plan: 1. Functional deficits which require 3+ hours per day of interdisciplinary therapy in a comprehensive inpatient rehab  setting.  Physiatrist is providing close team supervision and 24 hour management of active medical problems listed below.  Physiatrist and rehab team continue to assess barriers to discharge/monitor patient progress toward functional and medical goals  Care Tool:  Bathing    Body parts bathed by patient: Right arm,Left arm,Chest,Abdomen,Front perineal area,Buttocks,Right upper leg,Left upper leg,Face   Body parts bathed by helper: Right lower leg,Left lower leg Body parts n/a: Left lower leg,Right lower leg   Bathing assist Assist Level: Minimal Assistance - Patient > 75%     Upper Body Dressing/Undressing Upper body dressing   What is the patient wearing?: Pull over shirt    Upper body assist Assist Level: Set up assist    Lower Body Dressing/Undressing Lower body dressing      What is the patient wearing?: Incontinence brief,Underwear/pull up     Lower body assist Assist for lower body dressing: Minimal Assistance - Patient > 75%     Toileting Toileting    Toileting assist Assist for toileting: Contact Guard/Touching assist     Transfers Chair/bed transfer  Transfers assist  Chair/bed transfer assist level: Contact Guard/Touching assist Chair/bed transfer assistive device: Geologist, engineering   Ambulation assist   Ambulation activity did not occur: Safety/medical concerns  Assist level: Contact Guard/Touching assist Assistive device: Walker-rolling Max distance: 50   Walk 10 feet activity   Assist  Walk 10 feet activity did not occur: Safety/medical concerns  Assist level: Contact Guard/Touching assist Assistive device: Walker-rolling   Walk 50 feet activity   Assist Walk 50 feet with 2 turns activity did not occur: Safety/medical concerns  Assist level: Contact Guard/Touching assist Assistive device: Walker-rolling    Walk 150 feet activity   Assist Walk 150 feet activity did not occur: Safety/medical  concerns  Assist level: Minimal Assistance - Patient > 75% Assistive device: Walker-rolling    Walk 10 feet on uneven surface  activity   Assist Walk 10 feet on uneven surfaces activity did not occur: Safety/medical concerns         Wheelchair     Assist Will patient use wheelchair at discharge?: Yes Type of Wheelchair: Manual    Wheelchair assist level: Supervision/Verbal cueing Max wheelchair distance: 190    Wheelchair 50 feet with 2 turns activity    Assist        Assist Level: Minimal Assistance - Patient > 75%   Wheelchair 150 feet activity     Assist      Assist Level: Minimal Assistance - Patient > 75%   Blood pressure 116/73, pulse 86, temperature 97.9 F (36.6 C), resp. rate 18, height 5\' 7"  (1.702 m), weight 81.8 kg, SpO2 100 %.  Medical Problem List and Plan: 1.  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  Continue CIR  3/16- will order B/L AFOs consult for pt due to B/L foot drop  -3/21- con't PT and OT- working on dispo right now  2.  Antithrombotics:  -DVT/anticoagulation:  Pharmaceutical: Continue Lovenox  2/17- Very high risk of DVT due to SCI-will need 2-3 months of Lovenox per CHEST guidelines.              -antiplatelet therapy: N/A 3. Pain Management: Continue Oxycodone prn.  Off Lyrica- on Duloxetine 60 mg QHS, increase to duloxetine to 90 mg QHS since can't do Gabapentin or Lyrica.   3/15- pt reports pain better controlled-con't regimen  3/21- pain, except spasticity, controlled with tylenol- con't regimen 4. Mood: LCSW to follow for evaluation and support.   Appreciate neuropsych follow-up             -antipsychotic agents: N/A 5. Neuropsych: This patient is capable of making decisions on her own behalf. 6. Skin/Wound Care:  Monitor incision for healing.  Added protein supplement to help promote healing.  7.  Fluids/Electrolytes/Nutrition: Monitor I/Os.  8. ABLA/macrocytosis:   Vitamin B12 173 9. Hypokalemia/Hypomagnesemia: Has resolved with brief supplementation.              potassium 4.0 on 3/7  3/15- K+ 4.0 yesterday -con't to monitor   3/21- K+ 4.2- con't to monitor 10. Polysubstance abuse:              Vitamin B12/folate levels ordered.   Continue to counsel  11. Neurogenic bowel and constipation: KUB ordered to determine stool burden.  Bowel program with dig stim nightly   Added Senokot 1 tab, changed to senna S2 tabs nightly on 3/13  3/15- going daily 1-2x/day- con't regimen  3/17-19- con't PO bowel meds, not  on bowel program, continent  3/21- having small to medium stools ~ 2x per day- con't regimen 12. Neurogenic bladder  Some improvement with timed voiding, however remains incontinent  3/14- says is dry during the day- but not at night- per pt.   3/15- says a little better with Enablex- con't regimen  3/17- is wet at night, but usually timed voiding works during day.   See #18 13. Neuropathic pain  Lyrica increased Lyrica to 75 mg TID, then DC'd due to edema  Controlled on 3/13 14. Spasticity  3/17- will increase baclofen to 15 mg QID since spasms aworse at night- might need to increase night time dose.   3/18-19- Spasms doing slightly better per pt- hard ot tell since she's an under-reportor- con't regimen-  As appear stable  3/21- will add Dantrolene 50 mg QHS since tone has gotten so much worse- will also change night time dose of Baclofen to 20 mg QHS.  15. Dry skin: Eucerin ordered, encouraged use.  16. B/L elbow/arm swelling  Improved off of Lyrica 17. Dysphagia  D 3 thins, continue to advance as tolerated 18. Urinary urgency/frequency  Trial Myrbetriq 25 mg QHS for urinary frequency,?  DC'd on 2/25  Added Flomax,?  DC'd on 3/2  Trial Enablex on 3/13  3/14- no change so far, but is normal to take a few days  3/15- slightly better per pt- con't regimen  3/19 check a  scan/pvr today  See #13 20. Severe calluses on Feet  2/27- feet warmed with warm wet washcloth, then lotion applied, then vaseline over that BID- to help cracks/calluses.   21. New UE weakness  3/16- will order cervical MRI- since doesn't have UTI/a reason other than neck that could be causing increased UE weakness.  3/17- not sure reason for increased weakness- pt's cervical MRI looks better- no compression and less edema- if persists, will call NSU again.  3/18-19- doing better- con't regimen of therapy and con't to monitor.     LOS: 33 days A FACE TO FACE EVALUATION WAS PERFORMED  Xzaria Teo 08/13/2020, 1:32 PM

## 2020-08-13 NOTE — Plan of Care (Signed)
Yvette Phillips  

## 2020-08-13 NOTE — Progress Notes (Signed)
Occupational Therapy Session Note  Patient Details  Name: Yvette Phillips MRN: [id number] Date of Birth: 06-30-1960  Today's Date: 08/13/2020 OT Individual Time: 7579-7282 OT Individual Time Calculation (min): 40 min    Short Term Goals: Week 5:  OT Short Term Goal 1 (Week 5): STG=LTG 2/2 ELOS  Skilled Therapeutic Interventions/Progress Updates:    Pt received sitting in w/c with no c/o pain. Pt completed sit > stand with CGA from w/c. Functional mobility into the bathroom with CGA, slow and intentional pace, good safety awareness. Pt required min A for clothing management for toileting tasks. She voided urine and BM, requiring CGA for standing level peri hygiene. Pt returned to her w/c and completed 100 ft of w/c propulsion with min A to maintain straight trajectory d/t fatigue. Pt was brought into the ADL apt to practice functional reaching in/out of cabinets. She required min cueing for positioning and safety standing at the counter. Pt completed functional reaching with min facilitation, but overall functional with light-moderate weighted items. Pt returned to her room and was left sitting up with all needs met. Chair pad alarm set.   Therapy Documentation Precautions:  Precautions Precautions: Fall,Cervical Precaution Comments: s/p ACDF C4-5 (no cervical brace needed per acute), BUE/LE weakness Restrictions Weight Bearing Restrictions: No   Therapy/Group: Individual Therapy  Curtis Sites 08/13/2020, 6:42 AM

## 2020-08-13 NOTE — Progress Notes (Signed)
Physical Therapy Session Note  Patient Details  Name: Yvette Phillips MRN: [id number] Date of Birth: 17-Nov-1960  Today's Date: 08/13/2020 PT Individual Time: 0900-1000 PT Individual Time Calculation (min): 60 min   Short Term Goals: Week 4:  PT Short Term Goal 1 (Week 4): Pt will perform bed mobility with min A consistently PT Short Term Goal 1 - Progress (Week 4): Progressing toward goal PT Short Term Goal 2 (Week 4): Pt ascend/descend 4 x 6" stairs with max A PT Short Term Goal 2 - Progress (Week 4): Met PT Short Term Goal 3 (Week 4): Pt will perform least restrictive transfer with CGA consistently PT Short Term Goal 3 - Progress (Week 4): Met  Skilled Therapeutic Interventions/Progress Updates:    Pt seated in recliner on arrival, agreeable to therapy. Donned shoes and socks with total A and transferred to w/c with RW and CGA. Pt transported to gym and performed stand pivot transfer to mat table in same manner previously stated. Session focused on bed mobility to L side to simulate home environement. Pt performed supine<>sit on mat table with min A progressing to CGA by end of session, x 4 with VCs for technique. Once in sidelying, pt performed BIL hip flexion on powder board x 10, and 3 x 5 hip extension with tapping facilitation to improve LE management during bed mobility. SPT performed passive hip extension stretch BIL in sidelying due to poor extension ROM. Pt returned to room and transferred to recliner with CGA and RW, was left with chair alarm active and all needs in reach.  Therapy Documentation Precautions:  Precautions Precautions: Fall,Cervical Precaution Comments: s/p ACDF C4-5 (no cervical brace needed per acute), BUE/LE weakness Restrictions Weight Bearing Restrictions: No General:    Therapy/Group: Individual Therapy  Sharen Counter, SPT 08/13/2020, 7:43 AM

## 2020-08-13 NOTE — Progress Notes (Signed)
Occupational Therapy Session Note  Patient Details  Name: Yvette Phillips MRN: [id number] Date of Birth: 03/19/1961  Today's Date: 08/13/2020 OT Individual Time: 7357-8978 OT Individual Time Calculation (min): 40 min    Short Term Goals: Week 3:  OT Short Term Goal 1 (Week 3): Pt will perform LB dressing with Mod A OT Short Term Goal 1 - Progress (Week 3): Progressing toward goal OT Short Term Goal 2 (Week 3): Pt will increase functional grip strength to perform oral hygiene w/ Supervision OT Short Term Goal 2 - Progress (Week 3): Met OT Short Term Goal 3 (Week 3): Pt will complete BSC/toilet transfers with CGA using LRAD OT Short Term Goal 3 - Progress (Week 3): Met OT Short Term Goal 4 (Week 3): Pt will stand with one hand support for 2+ minutes in order to complete LB ADL OT Short Term Goal 4 - Progress (Week 3): Met Week 4:  OT Short Term Goal 1 (Week 4): STG=LTG 2/2 ELOS OT Short Term Goal 1 - Progress (Week 4): Progressing toward goal Week 5:  OT Short Term Goal 1 (Week 5): STG=LTG 2/2 ELOS  Skilled Therapeutic Interventions/Progress Updates:    Pt greeted at time of session sitting up in wheelchair agreeable to OT session, no pain but c/o feeling like a "jumping bean" feeling like tingling today. Pt venting at beginning of session d/t emotional struggles lately and feelings about going home. Emotional support provided. Needing to toilet, walked from wheelchair > toilet > bed with CGA overall with RW.  CGA-Min for toileting, able to maneuver clothing down and hygiene without assist but cues to maneuver clothing back over hips. After walking > bed, therapist assist to doff clothing prior to laying down in bed for dynamic standing before stand > sit with CGA. Sit > supine Min/CGA for LE management but pt fatigued after long day of therapies. Doffed AFO and toe up brace along with socks/shoes. Provided bag for pt to pick out clothing for tomorrow to work on [facility]. Positioned for comfort in  bed with alarm on call bell in reach.   Therapy Documentation Precautions:  Precautions Precautions: Fall,Cervical Precaution Comments: s/p ACDF C4-5 (no cervical brace needed per acute), BUE/LE weakness Restrictions Weight Bearing Restrictions: No     Therapy/Group: Individual Therapy  Viona Gilmore 08/13/2020, 3:44 PM

## 2020-08-14 LAB — COMPREHENSIVE METABOLIC PANEL
ALT: 24 U/L (ref 0–44)
AST: 22 U/L (ref 15–41)
Albumin: 3.4 g/dL — ABNORMAL LOW (ref 3.5–5.0)
Alkaline Phosphatase: 93 U/L (ref 38–126)
Anion gap: 9 (ref 5–15)
BUN: 18 mg/dL (ref 8–23)
CO2: 26 mmol/L (ref 22–32)
Calcium: 10 mg/dL (ref 8.9–10.3)
Chloride: 103 mmol/L (ref 98–111)
Creatinine, Ser: 0.73 mg/dL (ref 0.44–1.00)
GFR, Estimated: >60 mL/min (ref >60)
Glucose, Bld: 95 mg/dL (ref 70–99)
Potassium: 4.5 mmol/L (ref 3.5–5.1)
Sodium: 138 mmol/L (ref 135–145)
Total Bilirubin: 0.9 mg/dL (ref 0.3–1.2)
Total Protein: 7.1 g/dL (ref 6.5–8.1)

## 2020-08-14 LAB — CBC WITH DIFFERENTIAL/PLATELET
Abs Immature Granulocytes: 0.03 10*3/uL (ref 0.00–0.07)
Basophils Absolute: 0.1 10*3/uL (ref 0.0–0.1)
Basophils Relative: 1 %
Eosinophils Absolute: 0.5 10*3/uL (ref 0.0–0.5)
Eosinophils Relative: 7 %
HCT: 40.2 % (ref 36.0–46.0)
Hemoglobin: 13.4 g/dL (ref 12.0–15.0)
Immature Granulocytes: 0 %
Lymphocytes Relative: 27 %
Lymphs Abs: 2.1 10*3/uL (ref 0.7–4.0)
MCH: 33.9 pg (ref 26.0–34.0)
MCHC: 33.3 g/dL (ref 30.0–36.0)
MCV: 101.8 fL — ABNORMAL HIGH (ref 80.0–100.0)
Monocytes Absolute: 0.7 10*3/uL (ref 0.1–1.0)
Monocytes Relative: 10 %
Neutro Abs: 4.3 10*3/uL (ref 1.7–7.7)
Neutrophils Relative %: 55 %
Platelets: 372 10*3/uL (ref 150–400)
RBC: 3.95 MIL/uL (ref 3.87–5.11)
RDW: 12.5 % (ref 11.5–15.5)
WBC: 7.7 10*3/uL (ref 4.0–10.5)
nRBC: 0 % (ref 0.0–0.2)

## 2020-08-14 MED ORDER — NICOTINE 7 MG/24HR TD PT24
7.0000 mg | MEDICATED_PATCH | Freq: Every day | TRANSDERMAL | Status: DC
  Administered 2020-08-15 – 2020-08-21 (×7): 7 mg via TRANSDERMAL
  Filled 2020-08-14 (×7): qty 1

## 2020-08-14 NOTE — Progress Notes (Signed)
Occupational Therapy Session Note  Patient Details  Name: Yvette Phillips MRN: [id number] Date of Birth: April 16, 1961  Today's Date: 08/14/2020 OT Individual Time: 2197-5883 OT Individual Time Calculation (min): 53 min    Short Term Goals: Week 5:  OT Short Term Goal 1 (Week 5): STG=LTG 2/2 ELOS  Skilled Therapeutic Interventions/Progress Updates:    Pt received in w/c, agreeable to therapy. Denies pain. Max A to don B w/c gloves, close S to doff. Self-propelled w/c to and from therapy kitchen ~75% of way with overall close S and increased time. Stood for overall ~20 min with 2 seated rest breaks to open and combine ingredients to make cookies and then wash dishes afterwards and CGA. STS with RW throughout session with light min A for initial lift. Req increased time and min A to open cookie packet. Practiced pouring out water and cookie mix from mixing bowl to target BUE strength and [place]. Initially req use of BUE to control pour and only able to lift full bowl ~1 to 2 inches above table height. Overall, less than ~15% spillage of ingredients. Min to mod VCs for upright posture and to prevent posterior bias in standing. Self-propelled w/c back to room, agreeable to stay up in w/c for lunch.    Pt left in w/c with chair alarm engaged, call bell in reach, and all immediate needs met.    Therapy Documentation Precautions:  Precautions Precautions: Fall,Cervical Precaution Comments: s/p ACDF C4-5 (no cervical brace needed per acute), BUE/LE weakness Restrictions Weight Bearing Restrictions: No Pain: Pain Assessment Pain Scale: 0-10 Pain Score: 1  ADL: See Care Tool for more details.  Therapy/Group: Individual Therapy  Volanda Napoleon MS, OTR/L  08/14/2020, 11:26 AM

## 2020-08-14 NOTE — Plan of Care (Signed)
Yvette Phillips Yvette Phillips  

## 2020-08-14 NOTE — Progress Notes (Signed)
Occupational Therapy Session Note  Patient Details  Name: Yvette Phillips MRN: [id number] Date of Birth: 1961-04-18  Today's Date: 08/14/2020 OT Individual Time: 0700-0800 OT Individual Time Calculation (min): 60 min    Short Term Goals: Week 5:  OT Short Term Goal 1 (Week 5): STG=LTG 2/2 ELOS  Skilled Therapeutic Interventions/Progress Updates:    Pt sleeping in bed upon arrival but easily aroused, although slower then usual. Pt required min A for supine>sit EOB with HOB elevated. Once seated EOB, pt c/o dizziness; BP 123/89. Pt sat EOB for approx 2 mins with report of "just not feeling right." Sit<>stand and functional amb with RW to bathroom with min A. Toileitng with min A. Bathing and dressing with sit<>stand at sink. See Care Tool for assist levels. Pt required more time then usual to complete tasks this morning. MD aware of change in function/status this morning. Pt amb with RW to recliner and remained in recliner. Breakfast tray setup on table. Seat alarm activated. All needs within reach.   Therapy Documentation Precautions:  Precautions Precautions: Fall,Cervical Precaution Comments: s/p ACDF C4-5 (no cervical brace needed per acute), BUE/LE weakness Restrictions Weight Bearing Restrictions: No  Pain:  Pt denies pain this morning   Therapy/Group: Individual Therapy  [name] 08/14/2020, 8:04 AM

## 2020-08-14 NOTE — Patient Care Conference (Signed)
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 Genice Rouge Care Coodinator Present: Cecile Sheerer, LCSWA;Stacey Marlyne Beards, RN, BSN, CRRN Nurse Present: Kennyth Arnold, RN PT Present: Peter Congo, PT OT Present: Ardis Rowan, COTA;Jennifer Katrinka Blazing, OT PPS Coordinator present : Fae Pippin, SLP     Current Status/Progress Goal Weekly Team Focus  Bowel/Bladder   Pt is continent x2.  Pt will remain continent x2.  Assess q shift and prn.   Swallow/Nutrition/ Hydration             ADL's   UB bathing/dressing-supervision; LB dressing-min/mod A; toileting-min A; functional tranfsers-CGA  min A/CGA overall  LB dressing/toileting, standing balance, functional tranfsers, BUE strengthening, safety awareness   Mobility   bed mobility min A to supervision at times in hospital bed, CGA transfers and gait with RW, wc mobility w/ supervision, ascending 6" stair with mod A, 3" stairs with min A, AFOs issued  min A overall, short distance gait  Gait, transfers, balance, bed mobility   Communication             Safety/Cognition/  Behavioral Observations            Pain   Pain is managed with current regimen.  Pain will remain <3.  Assess q shift and prn.   Skin               Discharge Planning:  Pt is uninsured. Pt has open APS case with [facility]. APS is willing to provide all assistance pt requires whether recommendations are [facility] or SNF.If t requires 24/7 care, recommendation will be SNF placement per APS.   Team Discussion: Stopped Dantrolene, may increase baclofen at HS. Continent/incontinent, headache from anxiety, treated appropriately. Excoriated buttocks, cracked heals, treated per orders. If patient is to require 24/7 assistance she will go SNF. Patient on target to meet rehab goals: Yes, will need 24/7 assistance, yesterday doing supine to sit from mat table was much better than today. Gait is looking much better with the braces that were ordered. OT will require 24/7 assistance, toileting is min assist to contact guard. SLP discharged patient.  *See Care Plan and progress notes for long and short-term goals.   Revisions to Treatment Plan:  Discontinued Dantrolene.  Teaching Needs: Family education, medication management, pain management, bowel/bladder management, skin/wound care, transfer training, gait training, balance training, endurance training, safety awareness.  Current Barriers to Discharge: Decreased caregiver support, Medical stability, Home enviroment access/layout, Incontinence, Neurogenic bowel and bladder, Wound care, Lack of/limited family support, Weight bearing restrictions, Medication compliance and Behavior  Possible Resolutions to Barriers: Continue current  medications, provide emotional support.     Medical Summary Current Status: nerve pain still an issue-  occ incontinence;   excoriated buttocks- not pressure ulcers- dizzy today/slightly out of it; usually continent on B/B; social services will pay for care- home vs SNF.  Barriers to Discharge: Decreased  family/caregiver support;Home enviroment access/layout;Incontinence;Medical stability;Neurogenic Bowel & Bladder;Weight bearing restrictions  Barriers to Discharge Comments: spasticity and dizziness today/side effects biggest limiters-her function is unpredictable. needs 24/7 assistance- got R AFO and L foot up brace Possible Resolutions to [employer] Focus: stop Dantrolene- might need to  increase Baclofen vs add Zanaflex- might need ITB pump in future?; goals dispo and spasticity; - d/c plan SNF? set for 3/24?   Continued Need for Acute Rehabilitation Level of Care: The patient requires daily medical management by a physician with specialized training in physical medicine and rehabilitation for the following reasons: Direction of a multidisciplinary physical rehabilitation program to maximize functional independence : Yes Medical management of patient stability for increased activity during participation in an intensive rehabilitation regime.: Yes Analysis of laboratory values and/or radiology reports with any subsequent need for medication adjustment and/or medical intervention. : Yes   I attest that I was present, lead the team conference, and concur with the assessment and plan of the team.   Tennis Must 08/14/2020, 6:22 PM

## 2020-08-14 NOTE — Progress Notes (Signed)
Physical Therapy Session Note  Patient Details  Name: Yvette Phillips MRN: [id number] Date of Birth: June 18, 1960  Today's Date: 08/14/2020 PT Individual Time: 0915-1012 PT Individual Time Calculation (min): 57 min   Short Term Goals: Week 5:  PT Short Term Goal 1 (Week 5): Pt will perform bed mobility with supervision consistently PT Short Term Goal 2 (Week 5): Pt will walk x 150 ft with CGA and LRAD. PT Short Term Goal 3 (Week 5): Pt will ascend and desend 4 x 6" steps with min A consistently.  Skilled Therapeutic Interventions/Progress Updates:    Pt received on [facility] with nsg present. CGA to min A for hygiene and clothing management with RW. In room gait CGA with RW. Donned socks and shoes with AFO/foot up brace total A. Pt propelled wc with BUE x 120 ft before becoming fatigued. Wc<>mat table with stand pivot, CGA and RW. Sit<>supine with mod A, pt demonstrating increased fatigue today. Pt transitioned to supine with mod A and participated in passive hip ROM stretches to combat spasticity, including ER, IR, and adductor stretching with hold-relax technique. Pt returned to room and remained seated in recliner with all needs in reach and chair alarm active.  Therapy Documentation Precautions:  Precautions Precautions: Fall,Cervical Precaution Comments: s/p ACDF C4-5 (no cervical brace needed per acute), BUE/LE weakness Restrictions Weight Bearing Restrictions: No    Therapy/Group: Individual Therapy  Dyane Dustman, SPT 08/14/2020, 3:44 PM

## 2020-08-14 NOTE — Progress Notes (Signed)
PROGRESS NOTE   Subjective/Complaints:  Pt reports main issue today is doesn't feel like herself- and also c/o significant dizziness- we started Dantrolene yesterday- labs checked- no infection brewing based on labs, so most likely the Dantrolene is at fault -will stop it.   Did also increase Baclofen last night- pt didn't mention any changes with it.   ROS:   Pt denies SOB, abd pain, CP, N/V/C/D, and vision changes   Objective:   No results found. Recent Labs    08/13/20 0534 08/14/20 0754  WBC 8.0 7.7  HGB 13.2 13.4  HCT 38.9 40.2  PLT 340 372   Recent Labs    08/13/20 0534 08/14/20 0754  NA 138 138  K 4.2 4.5  CL 100 103  CO2 30 26  GLUCOSE 88 95  BUN 16 18  CREATININE 0.72 0.73  CALCIUM 9.8 10.0    Intake/Output Summary (Last 24 hours) at 08/14/2020 1004 Last data filed at 08/13/2020 1840 Gross per 24 hour  Intake 360 ml  Output -  Net 360 ml        Physical Exam: Vital Signs Blood pressure 124/70, pulse 81, temperature 97.8 F (36.6 C), resp. rate 16, height 5\' 7"  (1.702 m), weight 81.8 kg, SpO2 99 %.        General: awake, alert, appropriate, but more sedated/lethargic than normal- less interactive, c/o dizziness, OT at bedside, at sink in w/c, NAD HENT: conjugate gaze; oropharynx moist- has red bump on L eyelid- looks same today CV: regular rate and rhythm; no JVD Pulmonary: CTA B/L; no W/R/R- good air movement- sounds great GI: soft, NT, ND, (+)BS Psychiatric: appropriate, but less interactive- lethargic and not speaking spontaneously a lot Neurological: Ox3- but see above. NO change in spasticity- MAS of 2-3 in LEs   Skin: Warm and dry.  Intact. No skin breakdown on backside.  Musc: No edema in extremities.  No tenderness in extremities. Motor: Bilateral upper extremities: 4 -/5 proximal distal- UEs- today  Are back to 4-/5 B/L Right lower extremity: Hip flexion, knee extension  2+/5, ankle dorsiflexion 4/5, no changes Left lower extremity: Hip flexion, knee extension 3 -/5, ankle dorsiflexion 4/5, no changes  Assessment/Plan: 1. Functional deficits which require 3+ hours per day of interdisciplinary therapy in a comprehensive inpatient rehab setting.  Physiatrist is providing close team supervision and 24 hour management of active medical problems listed below.  Physiatrist and rehab team continue to assess barriers to discharge/monitor patient progress toward functional and medical goals  Care Tool:  Bathing    Body parts bathed by patient: Right arm,Left arm,Chest,Abdomen,Front perineal area,Buttocks,Right upper leg,Left upper leg,Face   Body parts bathed by helper: Right lower leg,Left lower leg Body parts n/a: Left lower leg,Right lower leg   Bathing assist Assist Level: Minimal Assistance - Patient > 75%     Upper Body Dressing/Undressing Upper body dressing   What is the patient wearing?: Pull over shirt    Upper body assist Assist Level: Set up assist    Lower Body Dressing/Undressing Lower body dressing      What is the patient wearing?: Incontinence brief,Underwear/pull up     Lower body assist Assist  for lower body dressing: Minimal Assistance - Patient > 75%     Toileting Toileting    Toileting assist Assist for toileting: Contact Guard/Touching assist     Transfers Chair/bed transfer  Transfers assist     Chair/bed transfer assist level: Contact Guard/Touching assist Chair/bed transfer assistive device: Geologist, engineering   Ambulation assist   Ambulation activity did not occur: Safety/medical concerns  Assist level: Contact Guard/Touching assist Assistive device: Walker-rolling Max distance: 50   Walk 10 feet activity   Assist  Walk 10 feet activity did not occur: Safety/medical concerns  Assist level: Contact Guard/Touching assist Assistive device: Walker-rolling   Walk 50 feet  activity   Assist Walk 50 feet with 2 turns activity did not occur: Safety/medical concerns  Assist level: Contact Guard/Touching assist Assistive device: Walker-rolling    Walk 150 feet activity   Assist Walk 150 feet activity did not occur: Safety/medical concerns  Assist level: Minimal Assistance - Patient > 75% Assistive device: Walker-rolling    Walk 10 feet on uneven surface  activity   Assist Walk 10 feet on uneven surfaces activity did not occur: Safety/medical concerns         Wheelchair     Assist Will patient use wheelchair at discharge?: Yes Type of Wheelchair: Manual    Wheelchair assist level: Supervision/Verbal cueing Max wheelchair distance: 190    Wheelchair 50 feet with 2 turns activity    Assist        Assist Level: Minimal Assistance - Patient > 75%   Wheelchair 150 feet activity     Assist      Assist Level: Minimal Assistance - Patient > 75%   Blood pressure 124/70, pulse 81, temperature 97.8 F (36.6 C), resp. rate 16, height 5\' 7"  (1.702 m), weight 81.8 kg, SpO2 99 %.  Medical Problem List and Plan: 1.  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  Continue CIR  3/16- will order B/L AFOs consult for pt due to B/L foot drop  3/22- con't PT and OT- work as well as she can, since not feeling well 2.  Antithrombotics:  -DVT/anticoagulation:  Pharmaceutical: Continue Lovenox  2/17- Very high risk of DVT due to SCI-will need 2-3 months of Lovenox per CHEST guidelines.              -antiplatelet therapy: N/A 3. Pain Management: Continue Oxycodone prn.  Off Lyrica- on Duloxetine 60 mg QHS, increase to duloxetine to 90 mg QHS since can't do Gabapentin or Lyrica.   3/15- pt reports pain better controlled-con't regimen  3/21- pain, except spasticity, controlled with tylenol- con't regimen 4. Mood: LCSW to follow for evaluation and  support.   Appreciate neuropsych follow-up             -antipsychotic agents: N/A 5. Neuropsych: This patient is capable of making decisions on her own behalf. 6. Skin/Wound Care:  Monitor incision for healing.  Added protein supplement to help promote healing.  7. Fluids/Electrolytes/Nutrition: Monitor I/Os.  8. ABLA/macrocytosis:   Vitamin B12 173 9. Hypokalemia/Hypomagnesemia: Has resolved with brief supplementation.              potassium 4.0 on 3/7  3/15- K+ 4.0 yesterday -con't to monitor   3/21- K+ 4.2- con't to monitor 10. Polysubstance abuse:              Vitamin B12/folate levels ordered.   Continue to counsel  11.  Neurogenic bowel and constipation: KUB ordered to determine stool burden.  Bowel program with dig stim nightly   Added Senokot 1 tab, changed to senna S2 tabs nightly on 3/13  3/15- going daily 1-2x/day- con't regimen  3/17-19- con't PO bowel meds, not on bowel program, continent  3/21- having small to medium stools ~ 2x per day- con't regimen 12. Neurogenic bladder  Some improvement with timed voiding, however remains incontinent  3/14- says is dry during the day- but not at night- per pt.   3/15- says a little better with Enablex- con't regimen  3/17- is wet at night, but usually timed voiding works during day.   See #18 13. Neuropathic pain  Lyrica increased Lyrica to 75 mg TID, then DC'd due to edema  Controlled on 3/13 14. Spasticity  3/17- will increase baclofen to 15 mg QID since spasms aworse at night- might need to increase night time dose.   3/18-19- Spasms doing slightly better per pt- hard ot tell since she's an under-reportor- con't regimen-  As appear stable  3/21- will add Dantrolene 50 mg QHS since tone has gotten so much worse- will also change night time dose of Baclofen to 20 mg QHS.   3/22- no significant difference, but likely having side effects from Dantrolene- will stop- and if better, might need ot add to allergy list? 15. Dry skin:  Eucerin ordered, encouraged use.  16. B/L elbow/arm swelling  Improved off of Lyrica 17. Dysphagia  D 3 thins, continue to advance as tolerated 18. Urinary urgency/frequency  Trial Myrbetriq 25 mg QHS for urinary frequency,?  DC'd on 2/25  Added Flomax,?  DC'd on 3/2  Trial Enablex on 3/13  3/14- no change so far, but is normal to take a few days  3/15- slightly better per pt- con't regimen  3/19 check a scan/pvr today  See #13 20. Severe calluses on Feet  2/27- feet warmed with warm wet washcloth, then lotion applied, then vaseline over that BID- to help cracks/calluses.   21. New UE weakness  3/16- will order cervical MRI- since doesn't have UTI/a reason other than neck that could be causing increased UE weakness.  3/17- not sure reason for increased weakness- pt's cervical MRI looks better- no compression and less edema- if persists, will call NSU again.  3/18-19- doing better- con't regimen of therapy and con't to monitor.    22. Dizziness  3/22- possibly from Dantrolene addition- will stop and monitor- will also decrease Nicoderm which too much nicotine can make her feel bad as well.  Labs look great- no infection.   LOS: 34 days A FACE TO FACE EVALUATION WAS PERFORMED  Megan Lovorn 08/14/2020, 10:04 AM

## 2020-08-14 NOTE — Progress Notes (Signed)
Patient ID: Yvette Phillips, female   DOB: [birth year], 60 y.o.   MRN: [id number]  SW left message for [name] APS caseworker [phone] cell)to follow-up with updates from team conference, and waiting on follow-up.   Cecile Sheerer, MSW, LCSWA Office: [phone] Cell: [phone] Fax: [phone]

## 2020-08-15 DIAGNOSIS — R252 Cramp and spasm: Secondary | ICD-10-CM

## 2020-08-15 MED ORDER — TIZANIDINE HCL 2 MG PO TABS
2.0000 mg | ORAL_TABLET | Freq: Two times a day (BID) | ORAL | Status: DC
  Administered 2020-08-15 – 2020-08-20 (×10): 2 mg via ORAL
  Filled 2020-08-15 (×10): qty 1

## 2020-08-15 NOTE — Progress Notes (Signed)
PROGRESS NOTE   Subjective/Complaints:  Feels a lot better off Dantrolene- just couldn't finish her day yesterday due to nausea/dizziness- barely finished therapy! Spasms at night a little to somewhat better.  We discussed dantrolene- will add to allergy list.  B/B going "the same'.    ROS:   Pt denies SOB, abd pain, CP, N/V/C/D, and vision changes  Objective:   No results found. Recent Labs    08/13/20 0534 08/14/20 0754  WBC 8.0 7.7  HGB 13.2 13.4  HCT 38.9 40.2  PLT 340 372   Recent Labs    08/13/20 0534 08/14/20 0754  NA 138 138  K 4.2 4.5  CL 100 103  CO2 30 26  GLUCOSE 88 95  BUN 16 18  CREATININE 0.72 0.73  CALCIUM 9.8 10.0    Intake/Output Summary (Last 24 hours) at 08/15/2020 0919 Last data filed at 08/15/2020 0720 Gross per 24 hour  Intake 480 ml  Output --  Net 480 ml        Physical Exam: Vital Signs Blood pressure 137/77, pulse 81, temperature (!) 97.5 F (36.4 C), resp. rate 17, height 5\' 7"  (1.702 m), weight 81.8 kg, SpO2 98 %.         General: awake, alert, appropriate, sitting up in manual w/c, OT at bedside, NAD HENT: conjugate gaze; oropharynx moist- L eyebrow bump- red- stable CV: regular rate; no JVD Pulmonary: CTA B/L; no W/R/R- good air movement GI: soft, NT, ND, (+)BS Psychiatric: appropriate Neurological: Ox3- MAS 2-3 in LEs- B/L- much more awake, alert today Skin: Warm and dry.  Intact. No skin breakdown on backside.  Musc: No edema in extremities.  No tenderness in extremities. Motor: Bilateral upper extremities: 4 -/5 proximal distal- UEs- today  Are back to 4-/5 B/L Right lower extremity: Hip flexion, knee extension 2+/5, ankle dorsiflexion 4/5, no changes Left lower extremity: Hip flexion, knee extension 3 -/5, ankle dorsiflexion 4/5, no changes  Assessment/Plan: 1. Functional deficits which require 3+ hours per day of interdisciplinary therapy in a  comprehensive inpatient rehab setting.  Physiatrist is providing close team supervision and 24 hour management of active medical problems listed below.  Physiatrist and rehab team continue to assess barriers to discharge/monitor patient progress toward functional and medical goals  Care Tool:  Bathing    Body parts bathed by patient: Right arm,Left arm,Chest,Abdomen,Front perineal area,Buttocks,Right upper leg,Left upper leg,Face   Body parts bathed by helper: Right lower leg,Left lower leg Body parts n/a: Left lower leg,Right lower leg   Bathing assist Assist Level: Minimal Assistance - Patient > 75%     Upper Body Dressing/Undressing Upper body dressing   What is the patient wearing?: Pull over shirt    Upper body assist Assist Level: Set up assist    Lower Body Dressing/Undressing Lower body dressing      What is the patient wearing?: Incontinence brief,Underwear/pull up     Lower body assist Assist for lower body dressing: Minimal Assistance - Patient > 75%     Toileting Toileting    Toileting assist Assist for toileting: Contact Guard/Touching assist     Transfers Chair/bed transfer  Transfers assist  Chair/bed transfer assist level: Contact Guard/Touching assist Chair/bed transfer assistive device: Geologist, engineering   Ambulation assist   Ambulation activity did not occur: Safety/medical concerns  Assist level: Contact Guard/Touching assist Assistive device: Walker-rolling Max distance: 50   Walk 10 feet activity   Assist  Walk 10 feet activity did not occur: Safety/medical concerns  Assist level: Contact Guard/Touching assist Assistive device: Walker-rolling   Walk 50 feet activity   Assist Walk 50 feet with 2 turns activity did not occur: Safety/medical concerns  Assist level: Contact Guard/Touching assist Assistive device: Walker-rolling    Walk 150 feet activity   Assist Walk 150 feet activity did not occur:  Safety/medical concerns  Assist level: Minimal Assistance - Patient > 75% Assistive device: Walker-rolling    Walk 10 feet on uneven surface  activity   Assist Walk 10 feet on uneven surfaces activity did not occur: Safety/medical concerns         Wheelchair     Assist Will patient use wheelchair at discharge?: Yes Type of Wheelchair: Manual    Wheelchair assist level: Supervision/Verbal cueing Max wheelchair distance: 120    Wheelchair 50 feet with 2 turns activity    Assist        Assist Level: Supervision/Verbal cueing   Wheelchair 150 feet activity     Assist      Assist Level: Minimal Assistance - Patient > 75%   Blood pressure 137/77, pulse 81, temperature (!) 97.5 F (36.4 C), resp. rate 17, height 5\' 7"  (1.702 m), weight 81.8 kg, SpO2 98 %.  Medical Problem List and Plan: 1.  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  Continue CIR  3/16- will order B/L AFOs consult for pt due to B/L foot drop  3/23- pt as central cord syndrome- cont' PT and OT 2.  Antithrombotics:  -DVT/anticoagulation:  Pharmaceutical: Continue Lovenox  2/17- Very high risk of DVT due to SCI-will need 2-3 months of Lovenox per CHEST guidelines.              -antiplatelet therapy: N/A 3. Pain Management: Continue Oxycodone prn.  Off Lyrica- on Duloxetine 60 mg QHS, increase to duloxetine to 90 mg QHS since can't do Gabapentin or Lyrica.   3/23- pain controlled except for spasticity- con't regimen 4. Mood: LCSW to follow for evaluation and support.   Appreciate neuropsych follow-up             -antipsychotic agents: N/A 5. Neuropsych: This patient is capable of making decisions on her own behalf. 6. Skin/Wound Care:  Monitor incision for healing.  Added protein supplement to help promote healing.  7. Fluids/Electrolytes/Nutrition: Monitor I/Os.  8. ABLA/macrocytosis:    Vitamin B12 173 9. Hypokalemia/Hypomagnesemia: Has resolved with brief supplementation.              potassium 4.0 on 3/7  3/15- K+ 4.0 yesterday -con't to monitor   3/21- K+ 4.2- con't to monitor 10. Polysubstance abuse:              Vitamin B12/folate levels ordered.   Continue to counsel  11. Neurogenic bowel and constipation: KUB ordered to determine stool burden.  Bowel program with dig stim nightly   Added Senokot 1 tab, changed to senna S2 tabs nightly on 3/13  3/15- going daily 1-2x/day- con't regimen  3/17-19- con't PO bowel meds, not on bowel program, continent  3/21- having small to medium stools ~ 2x  per day- con't regimen 12. Neurogenic bladder  Some improvement with timed voiding, however remains incontinent  3/14- says is dry during the day- but not at night- per pt.   3/15- says a little better with Enablex- con't regimen  3/17- is wet at night, but usually timed voiding works during day.   See #18 13. Neuropathic pain  Lyrica increased Lyrica to 75 mg TID, then DC'd due to edema  Controlled on 3/13 14. Spasticity  3/17- will increase baclofen to 15 mg QID since spasms aworse at night- might need to increase night time dose.   3/18-19- Spasms doing slightly better per pt- hard ot tell since she's an under-reportor- con't regimen-  As appear stable  3/21- will add Dantrolene 50 mg QHS since tone has gotten so much worse- will also change night time dose of Baclofen to 20 mg QHS.   3/22- no significant difference, but likely having side effects from Dantrolene- will stop- and if better, might need ot add to allergy list?  3/23- added Dantrolene to allergy list- added Zanaflex 2 mg BID for spasticity 15. Dry skin: Eucerin ordered, encouraged use.  16. B/L elbow/arm swelling  Improved off of Lyrica 17. Dysphagia  D 3 thins, continue to advance as tolerated 18. Urinary urgency/frequency  Trial Myrbetriq 25 mg QHS for urinary frequency,?  DC'd on 2/25  Added Flomax,?   DC'd on 3/2  Trial Enablex on 3/13  3/14- no change so far, but is normal to take a few days  3/15- slightly better per pt- con't regimen  3/19 check a scan/pvr today  See #13 20. Severe calluses on Feet  2/27- feet warmed with warm wet washcloth, then lotion applied, then vaseline over that BID- to help cracks/calluses.   21. New UE weakness  3/16- will order cervical MRI- since doesn't have UTI/a reason other than neck that could be causing increased UE weakness.  3/17- not sure reason for increased weakness- pt's cervical MRI looks better- no compression and less edema- if persists, will call NSU again.  3/18-19- doing better- con't regimen of therapy and con't to monitor.    22. Dizziness  3/22- possibly from Dantrolene addition- will stop and monitor- will also decrease Nicoderm which too much nicotine can make her feel bad as well.  Labs look great- no infection.   3/23- resolved- from dantrolene- made allergy.   LOS: 35 days A FACE TO FACE EVALUATION WAS PERFORMED  Jo-Ann Johanning 08/15/2020, 9:19 AM

## 2020-08-15 NOTE — Progress Notes (Signed)
Physical Therapy Session Note  Patient Details  Name: Yvette Phillips MRN: [id number] Date of Birth: [birth year]  Today's Date: 08/15/2020 PT Individual Time: 0900-1000 PT Individual Time Calculation (min): 60 min   Short Term Goals: Week 5:  PT Short Term Goal 1 (Week 5): Pt will perform bed mobility with supervision consistently PT Short Term Goal 2 (Week 5): Pt will walk x 150 ft with CGA and LRAD. PT Short Term Goal 3 (Week 5): Pt will ascend and desend 4 x 6" steps with min A consistently.  Skilled Therapeutic Interventions/Progress Updates:    Pt seated in recliner on arrival, agreeable to therapy. Donned socks and shoes total A. Pt performed gait and transfers with CGA and RW throughout session. Pt requests to use bathroom, CGA for balance, min A for clothing management due to time. 3" steps taps 2 x 10 with BIL handrails and CGA for balance and tapping facilitation at R hip flexors. Pt performed 3" steps x 8 with min A and L handrail. Pt performed bed mobility sit<>supine x 2 to L side on mat able with min A for LE management and VCs for LE position. Pt returned to recliner after session and was left with chair alarm active and all needs in reach.  Therapy Documentation Precautions:  Precautions Precautions: Fall,Cervical Precaution Comments: s/p ACDF C4-5 (no cervical brace needed per acute), BUE/LE weakness Restrictions Weight Bearing Restrictions: No Other Treatments:      Therapy/Group: Individual Therapy  Dyane Dustman, SPT 08/15/2020, 4:22 PM

## 2020-08-15 NOTE — Progress Notes (Signed)
Occupational Therapy Session Note  Patient Details  Name: Yvette Phillips MRN: [id number] Date of Birth: September 19, 1960  Today's Date: 08/15/2020 OT Individual Time: 1330-1425 OT Individual Time Calculation (min): 55 min    Short Term Goals: Week 5:  OT Short Term Goal 1 (Week 5): STG=LTG 2/2 ELOS  Skilled Therapeutic Interventions/Progress Updates:    Pt resting in recliner upon arrival. OT intervention with focus on NMES during BUE functional and therapeutic tasks to increase independence with BADLs. Pt practiced grasping and releasing cups and manipulating yellow and red theraputty.  1:1 NMES applied to RUE and LUE finger flexors to increase grasp strength  Ratio 1:3 Rate 35 pps Waveform- Asymmetric Ramp 1.0 Pulse 300 Intensity- 18 Duration -  10 each Rt and Lt    No adverse reactions after treatment and is skin intact. Increased grip strength as noted in ability to manipulate red theraputty. Pt unable to mainipulate red putty prior to NMES.  Pt remained in recliner with all needs within reach. Seat alarm activated.   Therapy Documentation Precautions:  Precautions Precautions: Fall,Cervical Precaution Comments: s/p ACDF C4-5 (no cervical brace needed per acute), BUE/LE weakness Restrictions Weight Bearing Restrictions: No Pain:  Pt denies pain this afternoon   Therapy/Group: Individual Therapy  [name] 08/15/2020, 2:39 PM

## 2020-08-15 NOTE — Progress Notes (Addendum)
Patient ID: Yvette Phillips, female   DOB: 05/25/1961, 60 y.o.   MRN: [id number]  SW spoke with [name] APS caseworker [phone] cell/f:[phone])to provide updates from team conference, and pt will still require 24/7 care. Reports will submit request for SNF placement since pt requires funding since uninsured. SW will wait on follow-up on update from APS on when a SNF is secure.  SW sent FL2 and NCPASRR letter to: [name] . SW updated pt on above.   Cecile Sheerer, MSW, LCSWA Office: [phone] Cell: [phone] Fax: [phone]

## 2020-08-15 NOTE — Progress Notes (Signed)
Occupational Therapy Session Note  Patient Details  Name: Morganne Haile MRN: [id number] Date of Birth: 10-Feb-1961  Today's Date: 08/15/2020 OT Individual Time: 0700-0800 OT Individual Time Calculation (min): 60 min    Short Term Goals: Week 5:  OT Short Term Goal 1 (Week 5): STG=LTG 2/2 ELOS  Skilled Therapeutic Interventions/Progress Updates:    Pt resting in bed eating breakfast upon arrival. OT intervention with focus on bed mobility, sit<>stand, standing balance, functional amb with RW, BADL retraining with sit<>stand from w/c at sink, and toileting. Bed mobility with supervision using bed functions. Amb with RW to bathroom with CGA. Toileting with CGA/min A. UB bathing/dressing with supervision. LB bathing with min A. LB dressing with mod A. Standing balance with supervision. Pt amb with RW to recliner with CGA. Pt remained in recliner with all needs within reach and seat alarm activated.   Therapy Documentation Precautions:  Precautions Precautions: Fall,Cervical Precaution Comments: s/p ACDF C4-5 (no cervical brace needed per acute), BUE/LE weakness Restrictions Weight Bearing Restrictions: No Pain:  Pt denies pain this morning   Therapy/Group: Individual Therapy  [name] 08/15/2020, 8:01 AM

## 2020-08-16 NOTE — Progress Notes (Signed)
Occupational Therapy Weekly Progress Note  Patient Details  Name: Yvette Phillips MRN: [id number] Date of Birth: June 17, 1960  Beginning of progress report period: August 07, 2020 End of progress report period: August 16, 2020  Pt is making steady progress towards LTG. Pt currently awaiting SNF placement. Pt will required 24 hour supervision/assistance upon discharge. All functional transfers with CGA. UB bathing/dressing with supervision. Toileting with CGA/min A. LB dressing with min A to thread BLE into pants and max A for donning shoes/AFOs. Standing balance with close supervision/CGA.   Patient continues to demonstrate the following deficits: muscle weakness, decreased cardiorespiratoy endurance, impaired timing and sequencing, abnormal tone, unbalanced muscle activation and decreased coordination and decreased standing balance and decreased balance strategies and therefore will continue to benefit from skilled OT intervention to enhance overall performance with BADL and Reduce care partner burden.  Patient progressing toward long term goals..  Continue plan of care.  OT Short Term Goals Week 5:  OT Short Term Goal 1 (Week 5): STG=LTG 2/2 ELOS OT Short Term Goal 1 - Progress (Week 5): Progressing toward goal Week 6:  OT Short Term Goal 1 (Week 6): STG=LTG secondary to ELOS (awaiting SNF placement)   [name] 08/16/2020, 6:27 AM

## 2020-08-16 NOTE — Progress Notes (Signed)
PROGRESS NOTE   Subjective/Complaints: Pt reports feeling ~ 10% better with Zanaflex- was NOT sedated/sleepy from side effects- had no side effects actually.  B/B are the same - LBM yesterday.     ROS:   Pt denies SOB, abd pain, CP, N/V/C/D, and vision changes   Objective:   No results found. Recent Labs    08/14/20 0754  WBC 7.7  HGB 13.4  HCT 40.2  PLT 372   Recent Labs    08/14/20 0754  NA 138  K 4.5  CL 103  CO2 26  GLUCOSE 95  BUN 18  CREATININE 0.73  CALCIUM 10.0    Intake/Output Summary (Last 24 hours) at 08/16/2020 0844 Last data filed at 08/16/2020 0820 Gross per 24 hour  Intake 1200 ml  Output --  Net 1200 ml        Physical Exam: Vital Signs Blood pressure 139/78, pulse 80, temperature 98 F (36.7 C), temperature source Oral, resp. rate 18, height 5\' 7"  (1.702 m), weight 81.8 kg, SpO2 99 %.    General: awake, alert, appropriate, sitting up in w/c with OT at side, getting groomed/bathed,  NAD HENT: conjugate gaze; oropharynx moist- redness better around L eyebrow bump CV: regular rate and rhythm; no JVD Pulmonary: CTA B/L; no W/R/R- good air movement GI: soft, NT, ND, (+)BS- normoactive Psychiatric: appropriate- emphatic won't deal with sibling anymore Neurological: Ox3- MAS 2-3 in LEs- slightly improved on LLE>RLE Skin: Warm and dry.  Intact. No skin breakdown on backside.  Musc: No edema in extremities.  No tenderness in extremities. Motor: Bilateral upper extremities: 4 -/5 proximal distal- UEs- today  Are back to 4-/5 B/L Right lower extremity: Hip flexion, knee extension 2+/5, ankle dorsiflexion 4/5, no changes Left lower extremity: Hip flexion, knee extension 3 -/5, ankle dorsiflexion 4/5, no changes  Assessment/Plan: 1. Functional deficits which require 3+ hours per day of interdisciplinary therapy in a comprehensive inpatient rehab setting.  Physiatrist is providing close  team supervision and 24 hour management of active medical problems listed below.  Physiatrist and rehab team continue to assess barriers to discharge/monitor patient progress toward functional and medical goals  Care Tool:  Bathing    Body parts bathed by patient: Right arm,Left arm,Chest,Abdomen,Front perineal area,Buttocks,Right upper leg,Left upper leg,Face   Body parts bathed by helper: Right lower leg,Left lower leg Body parts n/a: Left lower leg,Right lower leg   Bathing assist Assist Level: Minimal Assistance - Patient > 75%     Upper Body Dressing/Undressing Upper body dressing   What is the patient wearing?: Pull over shirt    Upper body assist Assist Level: Set up assist    Lower Body Dressing/Undressing Lower body dressing      What is the patient wearing?: Incontinence brief,Underwear/pull up     Lower body assist Assist for lower body dressing: Minimal Assistance - Patient > 75%     Toileting Toileting    Toileting assist Assist for toileting: Contact Guard/Touching assist     Transfers Chair/bed transfer  Transfers assist     Chair/bed transfer assist level: Contact Guard/Touching assist Chair/bed transfer assistive device:   Ambulation assist  Ambulation activity did not occur: Safety/medical concerns  Assist level: Contact Guard/Touching assist Assistive device: Walker-rolling Max distance: 50   Walk 10 feet activity   Assist  Walk 10 feet activity did not occur: Safety/medical concerns  Assist level: Contact Guard/Touching assist Assistive device: Walker-rolling   Walk 50 feet activity   Assist Walk 50 feet with 2 turns activity did not occur: Safety/medical concerns  Assist level: Contact Guard/Touching assist Assistive device: Walker-rolling    Walk 150 feet activity   Assist Walk 150 feet activity did not occur: Safety/medical concerns  Assist level: Minimal Assistance - Patient >  75% Assistive device: Walker-rolling    Walk 10 feet on uneven surface  activity   Assist Walk 10 feet on uneven surfaces activity did not occur: Safety/medical concerns         Wheelchair     Assist Will patient use wheelchair at discharge?: Yes Type of Wheelchair: Manual    Wheelchair assist level: Supervision/Verbal cueing Max wheelchair distance: 120    Wheelchair 50 feet with 2 turns activity    Assist        Assist Level: Supervision/Verbal cueing   Wheelchair 150 feet activity     Assist      Assist Level: Minimal Assistance - Patient > 75%   Blood pressure 139/78, pulse 80, temperature 98 F (36.7 C), temperature source Oral, resp. rate 18, height 5\' 7"  (1.702 m), weight 81.8 kg, SpO2 99 %.  Medical Problem List and Plan: 1.  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  Continue CIR  3/16- will order B/L AFOs consult for pt due to B/L foot drop  3/24- con't PT and OT- goal to place in SNF 2.  Antithrombotics:  -DVT/anticoagulation:  Pharmaceutical: Continue Lovenox  2/17- Very high risk of DVT due to SCI-will need 2-3 months of Lovenox per CHEST guidelines.   3/24- will con't Lovenox-will need 3 months from date of surgery since not walking far enough.              -antiplatelet therapy: N/A 3. Pain Management: Continue Oxycodone prn.  Off Lyrica- on Duloxetine 60 mg QHS, increase to duloxetine to 90 mg QHS since can't do Gabapentin or Lyrica.   3/23- pain controlled except for spasticity- con't regimen 4. Mood: LCSW to follow for evaluation and support.   Appreciate neuropsych follow-up             -antipsychotic agents: N/A 5. Neuropsych: This patient is capable of making decisions on her own behalf. 6. Skin/Wound Care:  Monitor incision for healing.  Added protein supplement to help promote healing.  7. Fluids/Electrolytes/Nutrition:  Monitor I/Os.  8. ABLA/macrocytosis:   Vitamin B12 173 9. Hypokalemia/Hypomagnesemia: Has resolved with brief supplementation.              potassium 4.0 on 3/7  3/15- K+ 4.0 yesterday -con't to monitor   3/21- K+ 4.2- con't to monitor 10. Polysubstance abuse:              Vitamin B12/folate levels ordered.   Continue to counsel  11. Neurogenic bowel and constipation: KUB ordered to determine stool burden.  Bowel program with dig stim nightly   Added Senokot 1 tab, changed to senna S2 tabs nightly on 3/13  3/15- going daily 1-2x/day- con't regimen  3/17-19- con't PO bowel meds, not on bowel program, continent  3/21- having small to medium stools ~ 2x per day-  con't regimen  3/23- LBM yesterday- con't PO meds 12. Neurogenic bladder  Some improvement with timed voiding, however remains incontinent  3/14- says is dry during the day- but not at night- per pt.   3/15- says a little better with Enablex- con't regimen  3/17- is wet at night, but usually timed voiding works during day.   See #18 13. Neuropathic pain  Lyrica increased Lyrica to 75 mg TID, then DC'd due to edema  Controlled on 3/13  3/23- con't Duloxetine 14. Spasticity  3/17- will increase baclofen to 15 mg QID since spasms aworse at night- might need to increase night time dose.   3/18-19- Spasms doing slightly better per pt- hard ot tell since she's an under-reportor- con't regimen-  As appear stable  3/21- will add Dantrolene 50 mg QHS since tone has gotten so much worse- will also change night time dose of Baclofen to 20 mg QHS.   3/22- no significant difference, but likely having side effects from Dantrolene- will stop- and if better, might need ot add to allergy list?  3/23- added Dantrolene to allergy list- added Zanaflex 2 mg BID for spasticity  3/24- Zanaflex helped some- 10% or so- con't Zanaflex- will increase in a few days? 15. Dry skin: Eucerin ordered, encouraged use.  16. B/L elbow/arm swelling  Improved off  of Lyrica 17. Dysphagia  D 3 thins, continue to advance as tolerated 18. Urinary urgency/frequency  Trial Myrbetriq 25 mg QHS for urinary frequency,?  DC'd on 2/25  Added Flomax,?  DC'd on 3/2  Trial Enablex on 3/13  3/14- no change so far, but is normal to take a few days  3/15- slightly better per pt- con't regimen  3/19 check a scan/pvr today  See #13 20. Severe calluses on Feet  2/27- feet warmed with warm wet washcloth, then lotion applied, then vaseline over that BID- to help cracks/calluses.   21. New UE weakness  3/16- will order cervical MRI- since doesn't have UTI/a reason other than neck that could be causing increased UE weakness.  3/17- not sure reason for increased weakness- pt's cervical MRI looks better- no compression and less edema- if persists, will call NSU again.  3/18-19- doing better- con't regimen of therapy and con't to monitor.    22. Dizziness  3/22- possibly from Dantrolene addition- will stop and monitor- will also decrease Nicoderm which too much nicotine can make her feel bad as well.  Labs look great- no infection.   3/23- resolved- from dantrolene- made allergy.   LOS: 36 days A FACE TO FACE EVALUATION WAS PERFORMED  Renell Allum 08/16/2020, 8:44 AM

## 2020-08-16 NOTE — Progress Notes (Signed)
Physical Therapy Weekly Progress Note  Patient Details  Name: Yvette Phillips MRN: [id number] Date of Birth: [birth year]  Beginning of progress report period: August 08, 2020 End of progress report period: August 16, 2020  Today's Date: 08/16/2020 PT Individual Time: 0900-1000, 1400-1435 PT Individual Time Calculation (min): 60 min, 35 min  Patient has met 1 of 3 short term goals.  Bed mobility CGA on mat table, min A in hospital bed with use of bed features. CGA with RW for gait up to 160 ft and transfers. Stairs (4 x 6") with min A and BIL handrails, CGA on 8 x 3" stairs with L handrail. W/c propulsion with supervision, up to 120 ft. Pt demonstrates improving LE strength and endurance, as well as improved gait with use of R AFO and L foot up brace.  Patient continues to demonstrate the following deficits muscle weakness, decreased cardiorespiratoy endurance and decreased standing balance and therefore will continue to benefit from skilled PT intervention to increase functional independence with mobility.  Patient progressing toward long term goals..  Continue plan of care.  PT Short Term Goals Week 5:  PT Short Term Goal 1 (Week 5): Pt will perform bed mobility with supervision consistently PT Short Term Goal 1 - Progress (Week 5): Progressing toward goal PT Short Term Goal 2 (Week 5): Pt will walk x 150 ft with CGA and LRAD. PT Short Term Goal 2 - Progress (Week 5): Progressing toward goal PT Short Term Goal 3 (Week 5): Pt will ascend and desend 4 x 6" steps with min A consistently. PT Short Term Goal 3 - Progress (Week 5): Met Week 6:  PT Short Term Goal 1 (Week 6): =LTG due to ELOS  Skilled Therapeutic Interventions/Progress Updates:    Session 1: Pt seated in recliner on arrival, agreeable to therapy. Requests to use bathroom, ambulated into bathroom with RW and CGA, CGA-min A for 3/3 toileting tasks. Pt ambulated 142 ft with CGA and RW before becoming fatigued. Pt was instructed on and  performed car transfer with CGA and RW w/c>car, but became anxious and required min A x 2 to stand from car seat. Pt performed 3" stairs x 8 with CGA and BIL handrails, and 6" steps x 4 with min A and BIL handrails. Pt returned to recliner after session and was left with all needs in reach and chair alarm active.  Session 2: Pt seated in recliner on arrival, agreeable to therapy. Transfers CGA with RW throughout session. Performed strength and sensation testing, see flow sheet for details. Donned shoes and braces with total A for time. Pt ambulated x 100 ft, x 160 ft with RW and CGA. Bed mobility with min A for LE management and VCs for technique. Pt was left with all needs in reach and bed alarm active.   Therapy Documentation Precautions:  Precautions Precautions: Fall,Cervical Precaution Comments: s/p ACDF C4-5 (no cervical brace needed per acute), BUE/LE weakness Restrictions Weight Bearing Restrictions: No   Therapy/Group: Individual Therapy  Sharen Counter, SPT 08/16/2020, 7:54 AM

## 2020-08-16 NOTE — Progress Notes (Signed)
Occupational Therapy Session Note  Patient Details  Name: Yvette Phillips MRN: [id number] Date of Birth: [birth year]  Today's Date: 08/16/2020 OT Individual Time: 0700-0755 OT Individual Time Calculation (min): 55 min    Short Term Goals: Week 5:  OT Short Term Goal 1 (Week 5): STG=LTG 2/2 ELOS OT Short Term Goal 1 - Progress (Week 5): Progressing toward goal Week 6:  OT Short Term Goal 1 (Week 6): STG=LTG secondary to ELOS (awaiting SNF placement)  Skilled Therapeutic Interventions/Progress Updates:    Pt resting in bed upon arrival. Breakfast tray had not been placed in room. Pt agreeable to getting OOB and engage in BADLs prior to eating breakfast. Min A for supine>sit EOB with HOB elevated. Sit<>stand and amb with RW to bathroom with CGA. Toileting with CGA. Pt completed UB bathing/dressing with supervisoin. Pt required assistance threading BLE into pants but able to pull over hips. Assistance with socks. Pt amb to recliner. Pt able to open containers and setup up items on breakfast tray without assistance. Self feeding with supervision. Pt very talkative and in a pleasant mood. Pt happy about discharge plans to SNF (to be determined).   Therapy Documentation Precautions:  Precautions Precautions: Fall,Cervical Precaution Comments: s/p ACDF C4-5 (no cervical brace needed per acute), BUE/LE weakness Restrictions Weight Bearing Restrictions: No    Pain:  Pt denies pain this morning   Therapy/Group: Individual Therapy  [name] 08/16/2020, 8:02 AM

## 2020-08-16 NOTE — Progress Notes (Signed)
Physical Therapy Discharge Summary  Patient Details  Name: Yvette Phillips MRN: [id number] Date of Birth: Aug 05, 1960   Patient has met 6 of 11 long term goals due to improved activity tolerance, improved balance, increased strength, increased range of motion, ability to compensate for deficits, functional use of  right lower extremity and left lower extremity and improved coordination.  Patient to discharge at ambulatory level with occasional wheelchair use due to decreased overall endurance and impaired balance level Min Assist.   Patient's care partner unavailable to provide the necessary physical assistance at discharge. Pt's family declined to participate in family education. Pt will require 24/7 assist upon discharge home and will need to discharge to SNF if 24/7 care is unavailable.  Reasons goals not met: Pt did not meet transfer and gait goals of Supervision level as she still requires CGA for safety and balance. Pt has met all goals adequately for a safe d/c to SNF.  Recommendation:  Patient will benefit from ongoing skilled PT services in skilled nursing facility setting to continue to advance safe functional mobility, address ongoing impairments in balance, safety, endurance, functional mobility, LE strength, independence with functional mobility, and minimize fall risk.  Equipment: RW and 18x18 wheelchair  Reasons for discharge: discharge from hospital  Patient/family agrees with progress made and goals achieved: Yes  PT Discharge Precautions/Restrictions Precautions Precautions: Fall;Cervical Restrictions Weight Bearing Restrictions: No Vision/Perception  Perception Perception: Within Functional Limits Praxis Praxis: Intact  Cognition Overall Cognitive Status: Within Functional Limits for tasks assessed Arousal/Alertness: Awake/alert Orientation Level: Oriented X4 Attention: Focused;Sustained Focused Attention: Appears intact Sustained Attention: Appears intact Memory:  Appears intact Awareness: Appears intact Problem Solving: Appears intact Safety/Judgment: Appears intact Sensation Sensation Light Touch: Impaired Detail Light Touch Impaired Details: Impaired RUE;Impaired LUE;Impaired RLE;Impaired LLE Hot/Cold: Appears Intact Proprioception: Appears Intact Stereognosis: Not tested Coordination Gross Motor Movements are Fluid and Coordinated: No Fine Motor Movements are Fluid and Coordinated: No Coordination and Movement Description: impaired 2/2 central cord injury, improved since eval Motor  Motor Motor: Abnormal postural alignment and control;Tetraplegia Motor - Skilled Clinical Observations: impaired 2/2 central cord injury Motor - Discharge Observations: improved since eval  Mobility Bed Mobility Bed Mobility: Rolling Right;Rolling Left;Right Sidelying to Sit;Sit to Supine Rolling Right: Supervision/verbal cueing Rolling Left: Supervision/Verbal cueing Supine to Sit: Supervision/Verbal cueing Sit to Supine: Supervision/Verbal cueing Transfers Transfers: Sit to Stand;Stand to Sit;Stand Pivot Transfers Sit to Stand: Contact Guard/Touching assist Stand to Sit: Contact Guard/Touching assist Stand Pivot Transfers: Contact Guard/Touching assist Stand Pivot Transfer Details: Verbal cues for technique;Verbal cues for safe use of DME/AE;Verbal cues for sequencing;Verbal cues for precautions/safety Transfer (Assistive device): Rolling walker Locomotion  Gait Ambulation: Yes Gait Assistance: Contact Guard/Touching assist Gait Distance (Feet): 200 Feet Assistive device: Rolling walker Gait Gait: Yes Gait Pattern: Impaired Gait Pattern: Decreased step length - right;Decreased step length - left;Decreased hip/knee flexion - right;Decreased hip/knee flexion - left;Decreased dorsiflexion - right;Decreased dorsiflexion - left;Trunk flexed Gait velocity: decreased Stairs / Additional Locomotion Stairs: Yes Stairs Assistance: Contact Guard/Touching  assist Stair Management Technique: Two rails;Step to pattern Number of Stairs: 8 Height of Stairs: 3 Wheelchair Mobility Wheelchair Mobility: Yes Wheelchair Assistance: Independent with Camera operator: Both upper extremities Wheelchair Parts Management: Needs assistance Distance: 150  Trunk/Postural Assessment  Cervical Assessment Cervical Assessment: Exceptions to [facility] (cervical precautions) Thoracic Assessment Thoracic Assessment: Exceptions to [facility] (kyphotic) Lumbar Assessment Lumbar Assessment: Exceptions to [facility] (posterior pelvic tilt) Postural Control Postural Control: Deficits on evaluation  Balance Balance Balance Assessed: Yes  Static Sitting Balance Static Sitting - Balance Support: Feet supported Static Sitting - Level of Assistance: 6: Modified independent (Device/Increase time) Dynamic Sitting Balance Dynamic Sitting - Balance Support: Feet supported;No upper extremity supported;During functional activity Dynamic Sitting - Level of Assistance: 5: Stand by assistance Static Standing Balance Static Standing - Balance Support: Bilateral upper extremity supported;During functional activity Static Standing - Level of Assistance: 5: Stand by assistance Dynamic Standing Balance Dynamic Standing - Balance Support: Bilateral upper extremity supported;During functional activity Dynamic Standing - Level of Assistance: 4: Min assist Extremity Assessment  RUE Assessment RUE Assessment: Exceptions to [facility] Passive Range of Motion (PROM) Comments: WFL Active Range of Motion (AROM) Comments: WFL General Strength Comments: roughly 4/5 shoulders and bicep, 4-/5 tricep, roughly 3+/5 grip LUE Assessment LUE Assessment: Exceptions to [facility] Passive Range of Motion (PROM) Comments: WFL Active Range of Motion (AROM) Comments: WFL General Strength Comments: roughly 4/5 shoulders and bicep, 4-/5 tricep, roughly 3+/5 grip RLE Assessment RLE Assessment: Exceptions to  [facility] RLE Strength Right Hip Flexion: 2+/5 Right Hip ABduction: 2+/5 Right Hip ADduction: 2+/5 Right Knee Flexion: 3/5 Right Knee Extension: 3/5 Right Ankle Dorsiflexion: 3/5 Right Ankle Plantar Flexion: 3+/5 LLE Assessment LLE Assessment: Exceptions to [facility] LLE Strength Left Hip Flexion: 2+/5 Left Hip ABduction: 2+/5 Left Hip ADduction: 2+/5 Left Knee Flexion: 3/5 Left Knee Extension: 3+/5 Left Ankle Dorsiflexion: 3/5 Left Ankle Plantar Flexion: 3+/5     Excell Seltzer, PT, DPT, CSRS 08/21/2020, 8:05 AM

## 2020-08-17 NOTE — Progress Notes (Signed)
Patient ID: Yvette Phillips, female   DOB: Sep 05, 1960, 60 y.o.   MRN: [id number]  SW left message forDeeDee Faison/[place] case[place]([phone]/[phone] cell/[phone]) to follow-up on status of SNF placement.   Cecile Sheerer, MSW, LCSWA Office: [phone] Cell: [phone] Fax: [phone]

## 2020-08-17 NOTE — Progress Notes (Signed)
Physical Therapy Session Note  Patient Details  Name: Yvette Phillips MRN: [id number] Date of Birth: [birth year]  Today's Date: 08/17/2020 PT Individual Time: 0906-1000 PT Individual Time Calculation (min): 54 min   Short Term Goals: Week 1:  PT Short Term Goal 1 (Week 1): pt to demonstrate supine<>sit mod A PT Short Term Goal 1 - Progress (Week 1): Met PT Short Term Goal 2 (Week 1): pt to demonstrate functional transfer with LRAD at mod A PT Short Term Goal 2 - Progress (Week 1): Met PT Short Term Goal 3 (Week 1): pt to demonstrate gait training with LRAD for 20' PT Short Term Goal 3 - Progress (Week 1): Progressing toward goal PT Short Term Goal 4 (Week 1): pt to demonstrate standing balance at mod A for at least 3 mins PT Short Term Goal 4 - Progress (Week 1): Met  Skilled Therapeutic Interventions/Progress Updates: Pt presents sitting in recliner and agreeable to therapy.  Pt transferred sit to stand w/ CGA after total A for donning AFO to RLE and showe w/ brace to left.  Pt amb 10' to W/C.  Pt requires verbal cues for use of BUEs for safe transition stand to sit.  Pt wheeled self x 50' w/ BUEs out of room and to hallway.  PT completed w/c negotiation to main gym for time conservation.  Pt performed standing toe-taps 3 x 5 to 6" platform (lack of smaller platform) w/ decreasing ability to flex hip as fatigues.  Pt encouraged to utilize visual input to improve.  Pt required seated rest breaks between trials.  Pt requires only touching to supervision for stand to sit using BUEs for safe descent into w/c.  Pt performed standing reaching placing and removing horseshoes for table to basketball hoop, at lower and higher setting.  Pt performed reaching across midline as well as using BUEs simultaneously.  1-2 LOB during trials w/ min A for regaining balance.  Pt encouraged to place LEs appropriately.  Pt states need to use BR.  Pt returned to room and amb 15' into BR, negotiating ramped entrance w/ CGA.   Pt required min A for managing clothing 2/2 tight pants.  Pt continent of urine in toilet, NT notified.  Pt amb to recliner chair .  Seat alarm on and all needs in reach.     Therapy Documentation Precautions:  Precautions Precautions: Fall,Cervical Precaution Comments: s/p ACDF C4-5 (no cervical brace needed per acute), BUE/LE weakness Restrictions Weight Bearing Restrictions: No General:   Vital Signs:   Pain: Pain Assessment Pain Scale: 0-10 Pain Score: 5  Pain Type: Acute pain Pain Location: Head Pain Descriptors / Indicators: Headache Pain Frequency: Intermittent Pain Onset: On-going Patients Stated Pain Goal: 4 Pain Intervention(s): Medication (See eMAR) Mobility:   Locomotion :    Trunk/Postural Assessment :    Balance:   Exercises:   Other Treatments:      Therapy/Group: Individual Therapy  [name] 08/17/2020, 12:02 PM

## 2020-08-17 NOTE — Progress Notes (Signed)
PROGRESS NOTE   Subjective/Complaints:  Pt reports some mornings, real tight.  Some days can get out of bed- sometimes not.   Spot on L eyebrow "busted" yesterday.  Will increase Zanaflex Monday.    ROS:   Pt denies SOB, abd pain, CP, N/V/C/D, and vision changes   Objective:   No results found. No results for input(s): WBC, HGB, HCT, PLT in the last 72 hours. No results for input(s): NA, K, CL, CO2, GLUCOSE, BUN, CREATININE, CALCIUM in the last 72 hours.  Intake/Output Summary (Last 24 hours) at 08/17/2020 0830 Last data filed at 08/16/2020 1847 Gross per 24 hour  Intake 840 ml  Output --  Net 840 ml        Physical Exam: Vital Signs Blood pressure 133/63, pulse 88, temperature 98.1 F (36.7 C), resp. rate 18, height 5\' 7"  (1.702 m), weight 81.8 kg, SpO2 98 %.     General: awake, alert, appropriate, sitting up in w/c at sink with OT, NAD HENT: conjugate gaze; oropharynx moist- L eyebrow spot now much smaller and has scab- drying out CV: regular rate; no JVD Pulmonary: CTA B/L; no W/R/R- good air movement GI: soft, NT, ND, (+)BS Psychiatric: appropriate- making jokes about who will f/u up with Neurological: Ox3; MAS down to 2 in LEs this AM Skin: Warm and dry.  Intact. No skin breakdown on backside.  Musc: No edema in extremities.  No tenderness in extremities. Motor: Bilateral upper extremities: 4 -/5 proximal distal- UEs- today  Are back to 4-/5 B/L Right lower extremity: Hip flexion, knee extension 2+/5, ankle dorsiflexion 4/5, no changes Left lower extremity: Hip flexion, knee extension 3 -/5, ankle dorsiflexion 4/5, no changes  Assessment/Plan: 1. Functional deficits which require 3+ hours per day of interdisciplinary therapy in a comprehensive inpatient rehab setting.  Physiatrist is providing close team supervision and 24 hour management of active medical problems listed below.  Physiatrist and  rehab team continue to assess barriers to discharge/monitor patient progress toward functional and medical goals  Care Tool:  Bathing    Body parts bathed by patient: Right arm,Left arm,Chest,Abdomen,Front perineal area,Buttocks,Right upper leg,Left upper leg,Face   Body parts bathed by helper: Right lower leg,Left lower leg Body parts n/a: Left lower leg,Right lower leg   Bathing assist Assist Level: Minimal Assistance - Patient > 75%     Upper Body Dressing/Undressing Upper body dressing   What is the patient wearing?: Pull over shirt    Upper body assist Assist Level: Set up assist    Lower Body Dressing/Undressing Lower body dressing      What is the patient wearing?: Incontinence brief,Pants     Lower body assist Assist for lower body dressing: Minimal Assistance - Patient > 75%     Toileting Toileting    Toileting assist Assist for toileting: Contact Guard/Touching assist     Transfers Chair/bed transfer  Transfers assist     Chair/bed transfer assist level: Contact Guard/Touching assist Chair/bed transfer assistive device:   Ambulation assist   Ambulation activity did not occur: Safety/medical concerns  Assist level: Contact Guard/Touching assist Assistive device: Walker-rolling Max distance: 94 ft  Walk 10 feet activity   Assist  Walk 10 feet activity did not occur: Safety/medical concerns  Assist level: Contact Guard/Touching assist Assistive device: Walker-rolling   Walk 50 feet activity   Assist Walk 50 feet with 2 turns activity did not occur: Safety/medical concerns  Assist level: Contact Guard/Touching assist Assistive device: Walker-rolling    Walk 150 feet activity   Assist Walk 150 feet activity did not occur: Safety/medical concerns  Assist level: Minimal Assistance - Patient > 75% Assistive device: Walker-rolling    Walk 10 feet on uneven surface  activity   Assist Walk 10 feet  on uneven surfaces activity did not occur: Safety/medical concerns         Wheelchair     Assist Will patient use wheelchair at discharge?: Yes Type of Wheelchair: Manual    Wheelchair assist level: Supervision/Verbal cueing Max wheelchair distance: 120    Wheelchair 50 feet with 2 turns activity    Assist        Assist Level: Supervision/Verbal cueing   Wheelchair 150 feet activity     Assist      Assist Level: Supervision/Verbal cueing   Blood pressure 133/63, pulse 88, temperature 98.1 F (36.7 C), resp. rate 18, height 5\' 7"  (1.702 m), weight 81.8 kg, SpO2 98 %.  Medical Problem List and Plan: 1.  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  Continue CIR  3/16- will order B/L AFOs consult for pt due to B/L foot drop  3/25- waiting for bed- con't PT and OT to maximize strength/function 2.  Antithrombotics:  -DVT/anticoagulation:  Pharmaceutical: Continue Lovenox  2/17- Very high risk of DVT due to SCI-will need 2-3 months of Lovenox per CHEST guidelines.   3/24- will con't Lovenox-will need 3 months from date of surgery since not walking far enough.              -antiplatelet therapy: N/A 3. Pain Management: Continue Oxycodone prn.  Off Lyrica- on Duloxetine 60 mg QHS, increase to duloxetine to 90 mg QHS since can't do Gabapentin or Lyrica.   3/23- pain controlled except for spasticity- con't regimen 4. Mood: LCSW to follow for evaluation and support.   Appreciate neuropsych follow-up             -antipsychotic agents: N/A 5. Neuropsych: This patient is capable of making decisions on her own behalf. 6. Skin/Wound Care:  Monitor incision for healing.  Added protein supplement to help promote healing.  7. Fluids/Electrolytes/Nutrition: Monitor I/Os.  8. ABLA/macrocytosis:   Vitamin B12 173 9. Hypokalemia/Hypomagnesemia: Has resolved with brief  supplementation.              potassium 4.0 on 3/7  3/15- K+ 4.0 yesterday -con't to monitor   3/21- K+ 4.2- con't to monitor 10. Polysubstance abuse:              Vitamin B12/folate levels ordered.   Continue to counsel  11. Neurogenic bowel and constipation: KUB ordered to determine stool burden.  Bowel program with dig stim nightly   Added Senokot 1 tab, changed to senna S2 tabs nightly on 3/13  3/15- going daily 1-2x/day- con't regimen  3/17-19- con't PO bowel meds, not on bowel program, continent  3/21- having small to medium stools ~ 2x per day- con't regimen  3/23- LBM yesterday- con't PO meds 12. Neurogenic bladder  Some improvement with timed voiding, however remains incontinent  3/14- says is dry during  the day- but not at night- per pt.   3/15- says a little better with Enablex- con't regimen  3/17- is wet at night, but usually timed voiding works during day.   See #18 13. Neuropathic pain  Lyrica increased Lyrica to 75 mg TID, then DC'd due to edema  Controlled on 3/13  3/23- con't Duloxetine 14. Spasticity  3/17- will increase baclofen to 15 mg QID since spasms aworse at night- might need to increase night time dose.   3/18-19- Spasms doing slightly better per pt- hard ot tell since she's an under-reportor- con't regimen-  As appear stable  3/21- will add Dantrolene 50 mg QHS since tone has gotten so much worse- will also change night time dose of Baclofen to 20 mg QHS.   3/22- no significant difference, but likely having side effects from Dantrolene- will stop- and if better, might need ot add to allergy list?  3/23- added Dantrolene to allergy list- added Zanaflex 2 mg BID for spasticity  3/24- Zanaflex helped some- 10% or so- con't Zanaflex- will increase in a few days?  3/25- will increase Zanaflex on Monday slightly- don't want to make her too sleepy. 15. Dry skin: Eucerin ordered, encouraged use.  16. B/L elbow/arm swelling  Improved off of Lyrica 17.  Dysphagia  D 3 thins, continue to advance as tolerated 18. Urinary urgency/frequency  Trial Myrbetriq 25 mg QHS for urinary frequency,?  DC'd on 2/25  Added Flomax,?  DC'd on 3/2  Trial Enablex on 3/13  3/14- no change so far, but is normal to take a few days  3/15- slightly better per pt- con't regimen  3/19 check a scan/pvr today  3/25- not retaining- con't Enablex  See #13 20. Severe calluses on Feet  2/27- feet warmed with warm wet washcloth, then lotion applied, then vaseline over that BID- to help cracks/calluses.   21. New UE weakness  3/16- will order cervical MRI- since doesn't have UTI/a reason other than neck that could be causing increased UE weakness.  3/17- not sure reason for increased weakness- pt's cervical MRI looks better- no compression and less edema- if persists, will call [facility]U again.  3/18-19- doing better- con't regimen of therapy and con't to monitor.    22. Dizziness  3/22- possibly from Dantrolene addition- will stop and monitor- will also decrease Nicoderm which too much nicotine can make her feel bad as well.  Labs look great- no infection.   3/23- resolved- from dantrolene- made allergy.   LOS: 37 days A FACE TO FACE EVALUATION WAS PERFORMED  Farrah Skoda 08/17/2020, 8:30 AM

## 2020-08-17 NOTE — Progress Notes (Signed)
Occupational Therapy Session Note  Patient Details  Name: Eleyna Brugh MRN: [id number] Date of Birth: 11-Sep-1960  Today's Date: 08/17/2020 OT Individual Time: 0700-0755 OT Individual Time Calculation (min): 55 min    Short Term Goals: Week 6:  OT Short Term Goal 1 (Week 6): STG=LTG secondary to ELOS (awaiting SNF placement)  Skilled Therapeutic Interventions/Progress Updates:    Pt resting in bed upon arrival. Breakfast had not been delivered. Pt agreeable to complete bathing/dressing prior to eating breakfast. Pt with increased spasms in BLE this morning and required min A for supine>sit EOB. Sit<>stand and amb with RW to bathroom with CGA. Pt returned to room and completred bathing/dressing with sit<>stand from w/c at sink. Pt required assistance to thread pants over BLE but was able to pull pants over hips. Pt amb with RW to recliner to eat breakfast. Pt able to open containers and setup her breakfast tray. Pt remained in recliner eating breakfast. All needs within reach.   Therapy Documentation Precautions:  Precautions Precautions: Fall,Cervical Precaution Comments: s/p ACDF C4-5 (no cervical brace needed per acute), BUE/LE weakness Restrictions Weight Bearing Restrictions: No Pain:  Pt denies pain but c/o some BLE spasms when getting OOB   Therapy/Group: Individual Therapy  [name] 08/17/2020, 8:02 AM

## 2020-08-18 DIAGNOSIS — K5903 Drug induced constipation: Secondary | ICD-10-CM

## 2020-08-18 DIAGNOSIS — K5900 Constipation, unspecified: Secondary | ICD-10-CM

## 2020-08-18 MED ORDER — VITAMIN B-12 1000 MCG PO TABS
2000.0000 ug | ORAL_TABLET | Freq: Every day | ORAL | Status: DC
  Administered 2020-08-18 – 2020-08-21 (×4): 2000 ug via ORAL
  Filled 2020-08-18 (×4): qty 2

## 2020-08-18 NOTE — Progress Notes (Signed)
Occupational Therapy Session Note  Patient Details  Name: Yvette Phillips MRN: [id number] Date of Birth: 1961/04/04  Today's Date: 08/18/2020 OT Group Time: 1100-1200 OT Group Time Calculation (min): 60 min   Skilled Therapeutic Interventions/Progress Updates:    Pt engaged in therapeutic w/c level dance group focusing on patient choice, UE/LE strengthening, salience, activity tolerance, and social participation. Pt was guided through various dance-based exercises involving UEs/LEs and trunk. All music was selected by group members. Emphasis placed on UE NMR and activity tolerance. Pt with brightened affect throughout group, visibly motivated by other members exercising and exhibited great participation while seated. At end of session she was returned to the room by OT.   Therapy Documentation Precautions:  Precautions Precautions: Fall,Cervical Precaution Comments: s/p ACDF C4-5 (no cervical brace needed per acute), BUE/LE weakness Restrictions Weight Bearing Restrictions: No Pain: no s/s pain during tx   ADL: ADL Equipment Provided: Feeding equipment Eating: Not assessed (not fully assessed, does have built up handles and able to simulate) Grooming: Moderate assistance Upper Body Bathing: Minimal assistance Where Assessed-Upper Body Bathing: Sitting at sink Lower Body Bathing: Maximal assistance Where Assessed-Lower Body Bathing: Sitting at sink,Standing at sink Upper Body Dressing: Moderate assistance Lower Body Dressing: Dependent Where Assessed-Lower Body Dressing: Sitting at sink,Standing at sink Toileting: Not assessed Toilet Transfer: Maximal assistance      Therapy/Group: Group Therapy  Nocole Zammit A Parthiv Mucci 08/18/2020, 12:43 PM

## 2020-08-18 NOTE — Progress Notes (Signed)
PROGRESS NOTE   Subjective/Complaints: Patient seen sitting up in bed this morning eating breakfast.  She states she slept well overnight.  She denies complaints.  ROS: Denies CP, SOB, N/V/D  Objective:   No results found. No results for input(s): WBC, HGB, HCT, PLT in the last 72 hours. No results for input(s): NA, K, CL, CO2, GLUCOSE, BUN, CREATININE, CALCIUM in the last 72 hours.  Intake/Output Summary (Last 24 hours) at 08/18/2020 1313 Last data filed at 08/18/2020 0855 Gross per 24 hour  Intake 818 ml  Output --  Net 818 ml        Physical Exam: Vital Signs Blood pressure 118/78, pulse 85, temperature 97.9 F (36.6 C), temperature source Oral, resp. rate 16, height 5\' 7"  (1.702 m), weight 81.8 kg, SpO2 97 %. Constitutional: No distress . Vital signs reviewed. HENT: Normocephalic.  Atraumatic. Eyes: EOMI. No discharge. Cardiovascular: No JVD.  RRR. Respiratory: Normal effort.  No stridor.  Bilateral clear to auscultation. GI: Non-distended.  BS +. Skin: Warm and dry.  Intact. Psych: Normal mood.  Normal behavior. Musc: No edema in extremities.  No tenderness in extremities. Neuro: Alert Motor: Bilateral upper extremities: 4 -/5 proximal distal, unchanged Right lower extremity: Hip flexion, knee extension 2+/5, ankle dorsiflexion 4/5, stable Left lower extremity: Hip flexion, knee extension 3 -/5, ankle dorsiflexion 4/5, no changes  Assessment/Plan: 1. Functional deficits which require 3+ hours per day of interdisciplinary therapy in a comprehensive inpatient rehab setting.  Physiatrist is providing close team supervision and 24 hour management of active medical problems listed below.  Physiatrist and rehab team continue to assess barriers to discharge/monitor patient progress toward functional and medical goals  Care Tool:  Bathing    Body parts bathed by patient: Right arm,Left arm,Chest,Abdomen,Front  perineal area,Buttocks,Right upper leg,Left upper leg,Face   Body parts bathed by helper: Right lower leg,Left lower leg Body parts n/a: Left lower leg,Right lower leg   Bathing assist Assist Level: Minimal Assistance - Patient > 75%     Upper Body Dressing/Undressing Upper body dressing   What is the patient wearing?: Pull over shirt    Upper body assist Assist Level: Set up assist    Lower Body Dressing/Undressing Lower body dressing      What is the patient wearing?: Incontinence brief,Pants     Lower body assist Assist for lower body dressing: Minimal Assistance - Patient > 75%     Toileting Toileting    Toileting assist Assist for toileting: Minimal Assistance - Patient > 75%     Transfers Chair/bed transfer  Transfers assist     Chair/bed transfer assist level: Contact Guard/Touching assist Chair/bed transfer assistive device:   Ambulation assist   Ambulation activity did not occur: Safety/medical concerns  Assist level: Contact Guard/Touching assist Assistive device: Walker-rolling Max distance: 15   Walk 10 feet activity   Assist  Walk 10 feet activity did not occur: Safety/medical concerns  Assist level: Contact Guard/Touching assist Assistive device: Walker-rolling   Walk 50 feet activity   Assist Walk 50 feet with 2 turns activity did not occur: Safety/medical concerns  Assist level: Contact Guard/Touching assist Assistive device: Walker-rolling  Walk 150 feet activity   Assist Walk 150 feet activity did not occur: Safety/medical concerns  Assist level: Minimal Assistance - Patient > 75% Assistive device: Walker-rolling    Walk 10 feet on uneven surface  activity   Assist Walk 10 feet on uneven surfaces activity did not occur: Safety/medical concerns         Wheelchair     Assist Will patient use wheelchair at discharge?: Yes Type of Wheelchair: Manual    Wheelchair assist  level: Supervision/Verbal cueing Max wheelchair distance: 120    Wheelchair 50 feet with 2 turns activity    Assist        Assist Level: Supervision/Verbal cueing   Wheelchair 150 feet activity     Assist      Assist Level: Supervision/Verbal cueing   Blood pressure 118/78, pulse 85, temperature 97.9 F (36.6 C), temperature source Oral, resp. rate 16, height 5\' 7"  (1.702 m), weight 81.8 kg, SpO2 97 %.  Medical Problem List and Plan: 1.  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  Continue CIR  B/L AFOs consult for pt due to B/L foot drop 2.  Antithrombotics:  -DVT/anticoagulation:  Pharmaceutical:   Lovenox - will need 3 months from date of surgery since not walking far enough.              -antiplatelet therapy: N/A 3. Pain Management: Continue Oxycodone prn.  Off Lyrica- on Duloxetine 60 mg QHS, increase to duloxetine to 90 mg QHS since can't do Gabapentin or Lyrica.   Controlled with meds on 3/26 4. Mood: LCSW to follow for evaluation and support.   Appreciate neuropsych follow-up             -antipsychotic agents: N/A 5. Neuropsych: This patient is capable of making decisions on her own behalf. 6. Skin/Wound Care:  Monitor incision for healing.  Added protein supplement to help promote healing.  7. Fluids/Electrolytes/Nutrition: Monitor I/Os.  8. ABLA/macrocytosis:   Vitamin B12 173, supplement initiated 9. Hypokalemia/Hypomagnesemia: Has resolved with brief supplementation.   Potassium 4.5 on 3/22 10. Polysubstance abuse:   Continue to counsel  11. Constipation: KUB ordered to determine stool burden.  Bowel program with dig stim nightly   Added Senokot 1 tab, changed to senna S2 tabs nightly on 3/13  Improving  12. Neurogenic bladder  Some improvement with timed voiding, however remains incontinent  3/15- says a little better with Enablex- con't  regimen  See #18 13. Neuropathic pain  Lyrica increased Lyrica to 75 mg TID, then DC'd due to edema  3/23- con't Duloxetine  Controlled on 3/26 14. Spasticity  Baclofen to 15 mg TID since spasms aworse at night, with night time dose of Baclofen to 20 mg QHS.   Added Dantrolene to allergy list  Plan to increase Zanaflex on Monday slightly 15. Dry skin: Eucerin ordered, encouraged use.  16. B/L elbow/arm swelling  Improved off of Lyrica 17. Dysphagia  D 3 thins, continue to advance as tolerated 18. Urinary urgency/frequency  Trial Myrbetriq 25 mg QHS for urinary frequency,?  DC'd on 2/25  Added Flomax,?  DC'd on 3/2  Continue Enablex  See #13 20. Severe calluses on Feet  2/27- feet warmed with warm wet washcloth, then lotion applied, then vaseline over that BID- to help cracks/calluses.   21. New UE weakness  Cervical MRI improved  Improving back to baseline 22. Dizziness  Possibly from Dantrolene addition- will stop and  monitor-decreased Nicoderm which too much nicotine can make her feel bad as well.   3/23- resolved- from dantrolene- made allergy.   LOS: 38 days A FACE TO FACE EVALUATION WAS PERFORMED  Shelbee Apgar Karis Juba 08/18/2020, 1:13 PM

## 2020-08-18 NOTE — Progress Notes (Signed)
Occupational Therapy Session Note  Patient Details  Name: Yvette Phillips MRN: [id number] Date of Birth: 1961/03/30  Today's Date: 08/18/2020 OT Individual Time: 7341-9379 OT Individual Time Calculation (min): 59 min   Short Term Goals: Week 5:  OT Short Term Goal 1 (Week 5): STG=LTG 2/2 ELOS OT Short Term Goal 1 - Progress (Week 5): Progressing toward goal  Skilled Therapeutic Interventions/Progress Updates:    Pt greeted in bed, pain reportedly manageable for tx. Pt premedicated for B LE spasm pain. Supine<sit completed with Min A as well as sit<stand with RW once seated EOB. CGA for ambulatory transfer to the toilet where pt needed Min A for clothing mgt and thoroughness with perihygiene in the back (pt with continent B+B void). She then ambulated to a w/c parked in front of sink to complete bathing/dressing tasks at sit<stand level. She needed A to functionally reach her feet, opting to wear a gown today vs clothing. Oral care completed with setup assist, pt using both hands to manipulate her toothbrush. Note that pt was also able to comb out and platt 1 strand of hair today on her own! We celebrated as this is the first time her hands have been able to meet motor demands of this hair care task! She then reported needing to use the restroom again. 2nd toilet transfer completed with CGA via stand pivot. Pt with another continent B+B void. The same assistance required for perihygiene + clothing. Pt remained sitting up in the w/c at close of session, all needs within reach and safety belt fastened. Tx focus placed on ADL retraining, functional transfers, standing balance, and UE NMR.   Therapy Documentation Precautions:  Precautions Precautions: Fall,Cervical Precaution Comments: s/p ACDF C4-5 (no cervical brace needed per acute), BUE/LE weakness Restrictions Weight Bearing Restrictions: No ADL: ADL Equipment Provided: Feeding equipment Eating: Not assessed (not fully assessed, does have built  up handles and able to simulate) Grooming: Moderate assistance Upper Body Bathing: Minimal assistance Where Assessed-Upper Body Bathing: Sitting at sink Lower Body Bathing: Maximal assistance Where Assessed-Lower Body Bathing: Sitting at sink,Standing at sink Upper Body Dressing: Moderate assistance Lower Body Dressing: Dependent Where Assessed-Lower Body Dressing: Sitting at sink,Standing at sink Toileting: Not assessed Toilet Transfer: Maximal assistance      Therapy/Group: Individual Therapy  Aaran Enberg A Damir Leung 08/18/2020, 12:42 PM

## 2020-08-18 NOTE — Plan of Care (Signed)
  Problem: Consults Goal: RH SPINAL CORD INJURY PATIENT EDUCATION Description:  See Patient Education module for education specifics.  Outcome: Progressing Goal: Skin Care Protocol Initiated - if Braden Score 18 or less Description: If consults are not indicated, leave blank or document N/A Outcome: Progressing   Problem: SCI BOWEL ELIMINATION Goal: RH STG MANAGE BOWEL WITH ASSISTANCE Description: STG Manage Bowel with Mod I Assistance. Outcome: Progressing Goal: RH STG SCI MANAGE BOWEL WITH MEDICATION WITH ASSISTANCE Description: STG SCI Manage bowel with medication with Mod I assistance. Outcome: Progressing   Problem: SCI BLADDER ELIMINATION Goal: RH STG MANAGE BLADDER WITH ASSISTANCE Description: STG Manage Bladder With Mod I Assistance Outcome: Progressing   Problem: RH SAFETY Goal: RH STG ADHERE TO SAFETY PRECAUTIONS W/ASSISTANCE/DEVICE Description: STG Adhere to Safety Precautions With Mod I Assistance/Device. Outcome: Progressing   Problem: RH PAIN MANAGEMENT Goal: RH STG PAIN MANAGED AT OR BELOW PT'S PAIN GOAL Description: <3 on a 0-10 pain scale. Outcome: Progressing   Problem: RH KNOWLEDGE DEFICIT SCI Goal: RH STG INCREASE KNOWLEDGE OF SELF CARE AFTER SCI Description: Patient will demonstrate knowledge of medication management, bowel and bladder care, skin and wound care, dietary management with educational materials and handouts provided by staff. Outcome: Progressing

## 2020-08-19 NOTE — Plan of Care (Signed)
  Problem: Consults Goal: RH SPINAL CORD INJURY PATIENT EDUCATION Description:  See Patient Education module for education specifics.  Outcome: Progressing Goal: Skin Care Protocol Initiated - if Braden Score 18 or less Description: If consults are not indicated, leave blank or document N/A Outcome: Progressing   Problem: SCI BOWEL ELIMINATION Goal: RH STG MANAGE BOWEL WITH ASSISTANCE Description: STG Manage Bowel with Mod I Assistance. Outcome: Progressing Goal: RH STG SCI MANAGE BOWEL WITH MEDICATION WITH ASSISTANCE Description: STG SCI Manage bowel with medication with Mod I assistance. Outcome: Progressing   Problem: SCI BLADDER ELIMINATION Goal: RH STG MANAGE BLADDER WITH ASSISTANCE Description: STG Manage Bladder With Mod I Assistance Outcome: Progressing   Problem: RH SAFETY Goal: RH STG ADHERE TO SAFETY PRECAUTIONS W/ASSISTANCE/DEVICE Description: STG Adhere to Safety Precautions With Mod I Assistance/Device. Outcome: Progressing   Problem: RH PAIN MANAGEMENT Goal: RH STG PAIN MANAGED AT OR BELOW PT'S PAIN GOAL Description: <3 on a 0-10 pain scale. Outcome: Progressing   Problem: RH KNOWLEDGE DEFICIT SCI Goal: RH STG INCREASE KNOWLEDGE OF SELF CARE AFTER SCI Description: Patient will demonstrate knowledge of medication management, bowel and bladder care, skin and wound care, dietary management with educational materials and handouts provided by staff. Outcome: Progressing   

## 2020-08-20 ENCOUNTER — Encounter: Payer: Self-pay | Admitting: Urology

## 2020-08-20 ENCOUNTER — Ambulatory Visit: Payer: Self-pay | Admitting: Urology

## 2020-08-20 MED ORDER — NICOTINE 7 MG/24HR TD PT24: 7.0000 mg | MEDICATED_PATCH | Freq: Every day | TRANSDERMAL | 0 refills | Status: DC

## 2020-08-20 MED ORDER — BACLOFEN 20 MG PO TABS: 20.0000 mg | ORAL_TABLET | Freq: Every day | ORAL | 0 refills | Status: DC

## 2020-08-20 MED ORDER — DULOXETINE HCL 30 MG PO CPEP: 90.0000 mg | ORAL_CAPSULE | Freq: Every day | ORAL | 3 refills | Status: DC

## 2020-08-20 MED ORDER — TIZANIDINE HCL 4 MG PO TABS: 4.0000 mg | ORAL_TABLET | Freq: Two times a day (BID) | ORAL | 0 refills | Status: DC

## 2020-08-20 MED ORDER — BACLOFEN 5 MG PO TABS: 15.0000 mg | ORAL_TABLET | Freq: Three times a day (TID) | ORAL | Status: DC

## 2020-08-20 MED ORDER — CYANOCOBALAMIN 2000 MCG PO TABS: 2000.0000 ug | ORAL_TABLET | Freq: Every day | ORAL | Status: DC

## 2020-08-20 MED ORDER — POLYETHYLENE GLYCOL 3350 17 G PO PACK: 17.0000 g | PACK | Freq: Two times a day (BID) | ORAL | 0 refills | Status: DC

## 2020-08-20 MED ORDER — ENOXAPARIN SODIUM 40 MG/0.4ML ~~LOC~~ SOLN: 40.0000 mg | SUBCUTANEOUS | Status: DC

## 2020-08-20 MED ORDER — BISACODYL 10 MG RE SUPP: 10.0000 mg | Freq: Every day | RECTAL | 0 refills | Status: DC | PRN

## 2020-08-20 MED ORDER — HYDROCERIN EX CREA: 1.0000 "application " | TOPICAL_CREAM | Freq: Two times a day (BID) | CUTANEOUS | 0 refills | Status: DC

## 2020-08-20 MED ORDER — DARIFENACIN HYDROBROMIDE ER 7.5 MG PO TB24: 7.5000 mg | ORAL_TABLET | Freq: Every day | ORAL | Status: DC

## 2020-08-20 MED ORDER — WHITE PETROLATUM EX OINT: 1.0000 "application " | TOPICAL_OINTMENT | Freq: Two times a day (BID) | CUTANEOUS | 0 refills | Status: DC

## 2020-08-20 MED ORDER — TIZANIDINE HCL 2 MG PO TABS
4.0000 mg | ORAL_TABLET | Freq: Two times a day (BID) | ORAL | Status: DC
  Administered 2020-08-20 – 2020-08-21 (×2): 4 mg via ORAL
  Filled 2020-08-20 (×2): qty 2

## 2020-08-20 MED ORDER — SENNOSIDES-DOCUSATE SODIUM 8.6-50 MG PO TABS: 2.0000 | ORAL_TABLET | Freq: Every day | ORAL | Status: AC

## 2020-08-20 MED ORDER — TRAZODONE HCL 50 MG PO TABS: 25.0000 mg | ORAL_TABLET | Freq: Every evening | ORAL | Status: DC | PRN

## 2020-08-20 MED ORDER — GERHARDT'S BUTT CREAM: 1.0000 "application " | TOPICAL_CREAM | Freq: Three times a day (TID) | CUTANEOUS | Status: DC

## 2020-08-20 MED ORDER — ENOXAPARIN SODIUM 40 MG/0.4ML ~~LOC~~ SOLN
40.0000 mg | SUBCUTANEOUS | Status: DC
  Administered 2020-08-21: 40 mg via SUBCUTANEOUS
  Filled 2020-08-20: qty 0.4

## 2020-08-20 NOTE — Progress Notes (Signed)
Physical Therapy Session Note  Patient Details  Name: Yvette Phillips MRN: [id number] Date of Birth: April 03, 1961  Today's Date: 08/20/2020 PT Individual Time: 0900-1000 PT Individual Time Calculation (min): 60 min   Short Term Goals: Week 6:  PT Short Term Goal 1 (Week 6): =LTG due to ELOS  Skilled Therapeutic Interventions/Progress Updates:    Pt seated in w/c on arrival, agreeable to therapy. Pt dressed and donned shoes with assist while nursing completed meds pass. Pt requested to use bathroom, ambulatory transfer to commode with CGA for 3/3 toileting tasks. Pt ambulated to gym, x 200 ft with RW and CGA to close supervision at times. Pt did have mild episode of knee buckling, which did not continue throughout session. Sit>supine with min A for LE management on mat table. Supine>sit with supervision and VCs for technique. Passive stretching performed with pt's leg on powder board in preparation for hip extension activity. Hip extension on powder board, x 20 BIL with tapping facilitation at glutes for improvement in hip extension strength during gait and bed mobility. Gait x 75 ft with RW and CGA before becoming fatigued. Pt remained in w/c after session with all needs in reach.  Therapy Documentation Precautions:  Precautions Precautions: Fall,Cervical Precaution Comments: s/p ACDF C4-5 (no cervical brace needed per acute), BUE/LE weakness Restrictions Weight Bearing Restrictions: No     Therapy/Group: Individual Therapy  Dyane Dustman 08/20/2020, 7:54 AM

## 2020-08-20 NOTE — Progress Notes (Signed)
Occupational Therapy Session Note  Patient Details  Name: Yvette Phillips MRN: [id number] Date of Birth: 18-Nov-1960  Today's Date: 08/20/2020 OT Individual Time: 1030-1059 OT Individual Time Calculation (min): 29 min  and Today's Date: 08/20/2020 OT Missed Time: 30 Minutes Missed Time Reason: Other (comment) (pt on phone with APS Case Worker)   Short Term Goals: Week 6:  OT Short Term Goal 1 (Week 6): STG=LTG secondary to ELOS (awaiting SNF placement)  Skilled Therapeutic Interventions/Progress Updates:    Pt sitting in w/c upon arrival taking to APS Case Worker. Pt missed 30 mins skilled OT services at beginning of session while pt talking on phone. Pt requested to use toilet. Pt amb with RW (CGA) to bathroom. Toileting with min A for thoroughness with hygiene. Pt returned to room and stood at sink to wash hands. Pt returned to recliner and remained in recliner with seat alarm activated.   Therapy Documentation Precautions:  Precautions Precautions: Fall,Cervical Precaution Comments: s/p ACDF C4-5 (no cervical brace needed per acute), BUE/LE weakness Restrictions Weight Bearing Restrictions: No General: General OT Amount of Missed Time: 30 Minutes   Pain: Pain Assessment Pain Scale: 0-10 Pain Score: 0-No pain   Therapy/Group: Individual Therapy  [name] 08/20/2020, 11:00 AM

## 2020-08-20 NOTE — Discharge Summary (Signed)
Physician Discharge Summary  Patient ID: Yvette Phillips MRN: [id number] DOB/AGE: 02-19-1961 60 y.o.  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  Worsening weakness. EXAM: MRI CERVICAL SPINE WITHOUT CONTRAST TECHNIQUE: Multiplanar, multisequence MR imaging of the cervical spine was performed. No intravenous contrast was administered. COMPARISON:  07/03/2020 FINDINGS: Alignment: Normal Vertebrae: Interval ACDF C4-5. Cord: There is no longer any active cord compression at the C4-5 level following ACDF. Cord volume loss and gliosis is present at this level. Less cord edema emanating out from that level when compared to the study of 5 weeks ago. Posterior Fossa, vertebral arteries, paraspinal tissues: Negative Disc levels: Foramen magnum is widely patent. 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 C2-3 and C3-4: Normal C4-5: As above, interval ACDF. Good decompression of the central canal. Subarachnoid space now surrounds the cord without ongoing cord compression. Myelomalacia at this level with cord volume loss and gliotic type signal. There is no longer any active cord edema emanating out from this level. C5-6: No disc pathology or central canal stenosis. Mild bony foraminal narrowing on the left, not likely compressive. C6-7: Normal C7-T1: Normal IMPRESSION: 1. Interval ACDF at C4-5. Good decompression of the central canal. There is no longer any ongoing  cord compression. Myelomalacia at this level with cord volume loss and gliotic type signal. Less cord edema emanating out from that level when compared to the preoperative study of 5 weeks ago. 2. Mild bony foraminal narrowing on the left at C5-6, not likely compressive. Otherwise normal. Electronically Signed   By: Nelson Chimes M.D.   On: 08/09/2020 08:14   DG Swallowing Func-Speech Pathology  Result Date: 07/27/2020 Objective Swallowing Evaluation: Type of Study: Bedside Swallow Evaluation  Patient Details Name: Yvette Phillips MRN: [id number] Date of Birth: 11/08/1960 Today's Date: 07/27/2020 Past Medical History: No past medical history on file. Past Surgical History: Past Surgical History: Procedure Laterality Date . ANTERIOR CERVICAL DECOMP/DISCECTOMY FUSION N/A 07/04/2020  Procedure: ANTERIOR CERVICAL DECOMPRESSION/DISCECTOMY FUSION 1 LEVEL;  Surgeon: Meade Maw, MD;  Location: [facility] ORS;  Service: Neurosurgery;  Laterality: N/A; HPI: 60yo female admitted 07/03/20 with increasing bilateral foot/leg pain, frequent falls. MRI: central cord syndrome, nicotine dependence, polysubstance abuse, alcohol abuse. MRI = large central disc protrusion at C4-C5, severe spinal canal stenosis, compressive myelopathy. ACDF 07/04/20. Intubated during surgery. SLP admitted to [facility] 07/11/20.  Subjective: Pt seated in recliner, awake, pleasant and cooperative. Assessment / Plan / Recommendation CHL IP CLINICAL IMPRESSIONS 07/27/2020 Clinical Impression --Pt presents with mild pharyngeal dysphagia, but functional improvements since last MBSS conducted on 07/13/20. When comparing images from last MBSS to today's study images, it appears that edema s/p pt's recent ACDF procedure has improved, and was likely a partial contributor to pharyngeal residues of solids during last study. The presence of ACDF hardware appears to still reduce the size of pyriform sinuses and overall pharyngeal space, and in turn either restrict the opening of the  upper esophageal sphincter (UES) and/or reduce pharyngeal squeeze during passage of  boluses. However, overall minimal residue of dysphagia 3 solids textures was noted, and it was effectively cleared (~90%) with use of liquid washes of thin barium, and by pt's sensed need/spontaeous trigger of an additional dry swallow. Pt has demonstrated ability to use these strategies with Supervision A verbal cues during trials at bedside, in addition to independent use of slow rate and small bite size. She has not been able to demonstrate carryover/effective use of other strategies to reduce pharyngeal residue such as a chin tuck. Therefore, the main purpose of today's study was to assess safety of solid advancement using only the strategies for which she has demonstrated carryover. Given results of today's study, SLP would recommend pt upgrade to dysphagia 3 solid textures (mechanical soft), continue thin liquids, alternate solids with liquids, perform extra dry swallows, slow rate, small bites/sips. Please continue with medications crushed in puree, as whole pills in applesauce were slow to pass through pharynx and esophagus upon brief scan (although no radiologist was present to confirm any esophageal dysfunction). SLP will continue to follow up with pt briefly to ensure tolerance of upgraded diet and use of compensatory strategies.  SLP Visit Diagnosis Dysphagia, pharyngeal phase (R13.13) Attention and concentration deficit following -- Frontal lobe and executive function deficit following -- Impact on safety and function Mild aspiration risk   CHL IP TREATMENT RECOMMENDATION 07/13/2020 Treatment Recommendations Therapy as outlined in treatment plan below   Prognosis 07/06/2020 Prognosis for Safe Diet Advancement Fair Barriers to Reach Goals -- Barriers/Prognosis Comment -- CHL IP DIET RECOMMENDATION 07/27/2020 SLP Diet Recommendations Dysphagia 3 (Mech soft) solids;Thin liquid Liquid Administration via Cup;Straw Medication  Administration Crushed with puree Compensations Slow rate;Small sips/bites;Follow solids with liquid;Multiple dry swallows after each bite/sip Postural Changes Remain semi-upright after after feeds/meals (Comment);Seated upright at 90 degrees   CHL IP OTHER RECOMMENDATIONS 07/27/2020 Recommended Consults Consider esophageal assessment Oral Care Recommendations Oral care BID Other Recommendations --   CHL IP FOLLOW UP RECOMMENDATIONS 07/07/2020 Follow up Recommendations Inpatient Rehab   CHL IP FREQUENCY AND DURATION 07/06/2020 Speech Therapy Frequency (ACUTE ONLY) min 1 x/week Treatment Duration 1 week;2 weeks      CHL IP ORAL PHASE 07/27/2020 Oral Phase -- Oral - Pudding Teaspoon -- Oral - Pudding Cup -- Oral - Honey Teaspoon -- Oral - Honey Cup -- Oral - Nectar Teaspoon -- Oral - Nectar Cup -- Oral - Nectar Straw -- Oral - Thin Teaspoon -- Oral - Thin Cup -- Oral - Thin Straw -- Oral - Puree -- Oral - Mech Soft Decreased bolus cohesion Oral - Regular NT Oral - Multi-Consistency -- Oral - Pill -- Oral Phase - Comment --  CHL IP PHARYNGEAL PHASE 07/27/2020 Pharyngeal Phase Impaired Pharyngeal- Pudding Teaspoon -- Pharyngeal -- Pharyngeal- Pudding Cup -- Pharyngeal -- Pharyngeal- Honey Teaspoon -- Pharyngeal -- Pharyngeal- Honey Cup -- Pharyngeal -- Pharyngeal- Nectar Teaspoon -- Pharyngeal -- Pharyngeal- Nectar Cup -- Pharyngeal -- Pharyngeal- Nectar Straw -- Pharyngeal -- Pharyngeal- Thin Teaspoon -- Pharyngeal -- Pharyngeal- Thin Cup Pharyngeal residue - pyriform Pharyngeal Material does not enter airway Pharyngeal- Thin Straw Pharyngeal residue - pyriform Pharyngeal -- Pharyngeal- Puree Reduced pharyngeal peristalsis;Pharyngeal residue - pyriform;Compensatory strategies attempted (with notebox) Pharyngeal -- Pharyngeal- Mechanical Soft Pharyngeal residue - posterior pharnyx;Pharyngeal residue - pyriform;Compensatory strategies attempted (with notebox) Pharyngeal -- Pharyngeal- Regular NT Pharyngeal -- Pharyngeal-  Multi-consistency -- Pharyngeal -- Pharyngeal- Pill WFL Pharyngeal -- Pharyngeal Comment --  CHL IP CERVICAL ESOPHAGEAL PHASE 07/27/2020 Cervical Esophageal Phase Impaired Pudding Teaspoon -- Pudding Cup --  Honey Teaspoon -- Honey Cup -- Nectar Teaspoon -- Nectar Cup -- Nectar Straw -- Thin Teaspoon -- Thin Cup -- Thin Straw -- Puree Other (Comment) Mechanical Soft Other (Comment) Regular NT Multi-consistency -- Pill Other (Comment) Cervical Esophageal Comment see impression Arbutus Leas 07/27/2020, 10:20 AM               Labs:  Basic Metabolic Panel: BMP Latest Ref Rng & Units 08/14/2020 08/13/2020 08/06/2020  Glucose 70 - 99 mg/dL 95 88 87  BUN 8 - 23 mg/dL _0 Creatinine 0.44 - 1.00 mg/dL 0.73 0.72 0.68  Sodium 135 - 145 mmol/L 138 138 137  Potassium 3.5 - 5.1 mmol/L 4.5 4.2 4.0  Chloride 98 - 111 mmol/L 103 100 102  CO2 22 - 32 mmol/L _1 Calcium 8.9 - 10.3 mg/dL 10.0 9.8 9.7    CBC: CBC Latest Ref Rng & Units 08/14/2020 08/13/2020 08/06/2020  WBC 4.0 - 10.5 K/uL 7.7 8.0 7.7  Hemoglobin 12.0 - 15.0 g/dL 13.4 13.2 12.7  Hematocrit 36.0 - 46.0 % 40.2 38.9 37.9  Platelets 150 - 400 K/uL 372 340 341    CBG: No results for input(s): GLUCAP in the last 168 hours.  Brief HPI:   Yvette Phillips is a 60 y.o. female with history of fall around christmas with onset of progressive weakness. She was admitted to [facility] 12/27-01/01/22 with work up revealing severe cervical spinal stenosis with cord compression. She was discharged to home with plans for surgery at a later date but continued to have BUE/BLE weakness with fall and was admitted on [facility] on 07/03/20 for work up after a wellness check. She was found to have severe spinal stenosis C4/C5 with compressive myelopathy and underwent ACDF C4/C5 by Dr. Cari Caraway. Post op course significant for difficulty swallowing as well as diffuse weakness BUE/BLE affecting gait as well as ADLs. CIR was recommended due to functional decline.    Hospital Course:  Cherysh Epperly was admitted to rehab 07/11/2020 for inpatient therapies to consist of PT, ST and OT at least three hours five days a week. Past admission physiatrist, therapy team and rehab RN have worked together to provide customized collaborative inpatient rehab. She was maintained on SQ Lovenox for DVT prophylaxis and will need to continue this for 3 months from date of surgery. BLE dopplers were negative for DVT. Macrocytosis noted with low Vitamin B12 level therefore supplement added. She was maintained on D2 diet which has slowly been advanced to D3 with aspiration precautions due to min pharyngeal residue (due to hardware) per  follow up MBS of 03/04.   Hypokalemia has resolved with brief supplementation. Serial check of BMET showed lytes and renal status to be WNL. Follow up CBC showed ABLA has resolved. Pain has currently been managed with prn use of tylenol and duloxetine was titrated up to 90 mg daily as she was unable to tolerate Lyrica or gabapentin due to SE of edema.  She required baclofen as well as Zanaflex due to severe spasticity. Zanaflex was titrated up to 4 mg bid on 03/28 and recommend to monitor for sedative SE. She was unable to tolerate dantrolene due to side effects of dizziness.    Voiding was monitored with PVR checks and Enablex was added to help manage frequency/urgency. She is currently continent of bladder during the day and has incontinence at nights. Bowel program was started and has been adjusted to  Senna S two tabs at night, Mirlax bid and  dig stim nightly. during the day. Blood pressures were monitored on TID basis and has been stable. MRI C spine was ordered for work up on 03/17 due to reports of new onset of UE weakness and showed good decompression without ongoing cord compression and decrease in edema. UA was negative and E coli felt to be due to colonization. Her symptoms are improving and weakness has improved back to baseline. Bilateral AFO's were ordered to help with  bilateral foot drop. She has been making slow steady progress but continues to require min assist. Due to lack of social support, she has elected on SNF for further therapy.    Rehab course: During patient's stay in rehab weekly team conferences were held to monitor patient's progress, set goals and discuss barriers to discharge. At admission, patient required max assist with basic self care tasks and with mobility. She exhibited mild dysphagia and diet was downgraded to D2, thins with use of aspiration precautions. She  has had improvement in activity tolerance, balance, postural control as well as ability to compensate for deficits.  She has had improvement in functional use RUE/LUE  and RLE/LLE as well as improvement in awareness. She is able to complete ADL tasks with min assist. She requires min assist for transfer and CGA to ambulate 10-12' with CGA. She is able to propel her wheelchair for >50' at modified independent level. Swallow function has improved and she was advanced to dysphagia 3 which is slow to her regular diet and is tolerating this without s/s of aspiration. ST signed off on 03/07 as goals were met.    Disposition: [place].  Diet: Dysphagia 3, thins with intermittent supervision.   Special Instructions: 1. Intermittent supervision at meals. Follow solids with liquids and provide extra gravy/condiments for moisture 2. Needs medications to be administered crushed in puree.  3. Continue Miralax bid with Senna S 2 tabs at bedtime for bowel program.   Discharge Instructions    Ambulatory referral to Physical Medicine Rehab   Complete by: As directed    1-2 weeks TC appointment   Ambulatory referral to Urology   Complete by: As directed    NEEDS FOLLOW UP WITH DR. MACDAIRMID FOR NEUROGENIC BLADDER--AFTER 08/09/20     Allergies as of 08/21/2020      Reactions   Codeine Anaphylaxis   Penicillins Anaphylaxis   Dantrolene Other (See Comments)   Pt had confusion,  dizziness and "off" with 50 mg Dantrolene   Lyrica [pregabalin] Other (See Comments)   Edema      Medication List    STOP taking these medications   alum & mag hydroxide-simeth 200-200-20 MG/5ML suspension Commonly known as: MAALOX/MYLANTA   docusate sodium 100 MG capsule Commonly known as: COLACE   nicotine 14 mg/24hr patch Commonly known as: NICODERM CQ - dosed in mg/24 hours Replaced by: nicotine 7 mg/24hr patch     TAKE these medications   acetaminophen 325 MG tablet Commonly known as: TYLENOL Take 1-2 tablets (325-650 mg total) by mouth every 4 (four) hours as needed for mild pain. What changed:   how much to take  when to take this  reasons to take this   baclofen 20 MG tablet Commonly known as: LIORESAL Take 1 tablet (20 mg total) by mouth at bedtime.   Baclofen 5 MG Tabs Take 15 mg by mouth 3 (three) times daily.   bisacodyl 10 MG suppository Commonly known as: DULCOLAX Place 1 suppository (10 mg total) rectally daily as needed  for moderate constipation (no bowel program anymore).   cyanocobalamin 2000 MCG tablet Take 1 tablet (2,000 mcg total) by mouth daily.   darifenacin 7.5 MG 24 hr tablet Commonly known as: ENABLEX Take 1 tablet (7.5 mg total) by mouth daily.   DULoxetine 30 MG capsule Commonly known as: CYMBALTA Take 3 capsules (90 mg total) by mouth at bedtime.   enoxaparin 40 MG/0.4ML injection Commonly known as: LOVENOX Inject 0.4 mLs (40 mg total) into the skin daily. What changed:   medication strength  how much to take   famotidine 20 MG tablet Commonly known as: PEPCID Take 1 tablet (20 mg total) by mouth at bedtime.   folic acid 1 MG tablet Commonly known as: FOLVITE Take 1 tablet (1 mg total) by mouth daily.   Gerhardt's butt cream Crea Apply 1 application topically 3 (three) times daily.   hydrocerin Crea Apply 1 application topically 2 (two) times daily.   multivitamin with minerals Tabs tablet Take 1 tablet by  mouth daily.   nicotine 7 mg/24hr patch Commonly known as: NICODERM CQ - dosed in mg/24 hr Place 1 patch (7 mg total) onto the skin daily. Replaces: nicotine 14 mg/24hr patch   polyethylene glycol 17 g packet Commonly known as: MIRALAX / GLYCOLAX Take 17 g by mouth 2 (two) times daily.   senna-docusate 8.6-50 MG tablet Commonly known as: Senokot-S Take 2 tablets by mouth at bedtime.   thiamine 100 MG tablet Take 1 tablet (100 mg total) by mouth daily.   tiZANidine 4 MG tablet Commonly known as: ZANAFLEX Take 1 tablet (4 mg total) by mouth 2 (two) times daily.   traZODone 50 MG tablet Commonly known as: DESYREL Take 0.5-1 tablets (25-50 mg total) by mouth at bedtime as needed for sleep.   white petrolatum Oint Commonly known as: VASELINE Apply 1 application topically 2 (two) times daily.       Contact information for follow-up providers    Lovorn, Jinny Blossom, MD Follow up.   Specialty: Physical Medicine and Rehabilitation Why: Office will call you with follow up appointment Contact information: [address] [place] [address] [phone]        Bjorn Loser, MD Follow up on 08/20/2020.   Specialty: Urology Why: Be there at 11:15 for 11:30 am appointment. They are in [address] in [place] at [facility].  Contact information: [address]  [place] [address] [phone]            Contact information for after-discharge care    Destination    [facility] .   Service: Skilled Nursing Contact information: [address] [place] [phone]                  Signed: Bary Leriche 08/21/2020, 9:00 AM

## 2020-08-20 NOTE — Progress Notes (Signed)
PROGRESS NOTE   Subjective/Complaints:  Pt sitting up in bed- eating breakfast, says it tastes "pretty good".  Wants to increase Zanaflex- not Baclofen for spasticity- will try to increase to 4 mg BID- but if gets too sleepy, will need to go back to 2 mg BID.   ROS:  Pt denies SOB, abd pain, CP, N/V/C/D, and vision changes   Objective:   No results found. No results for input(s): WBC, HGB, HCT, PLT in the last 72 hours. No results for input(s): NA, K, CL, CO2, GLUCOSE, BUN, CREATININE, CALCIUM in the last 72 hours.  Intake/Output Summary (Last 24 hours) at 08/20/2020 1333 Last data filed at 08/20/2020 0900 Gross per 24 hour  Intake 477 ml  Output 1 ml  Net 476 ml        Physical Exam: Vital Signs Blood pressure 102/64, pulse 91, temperature 98.1 F (36.7 C), temperature source Oral, resp. rate 18, height 5\' 7"  (1.702 m), weight 81.8 kg, SpO2 93 %.   General: awake, alert, appropriate, sitting up in bed; eating with built up handles on fork/spoon, NAD HENT: conjugate gaze; oropharynx moist CV: regular rate; no JVD Pulmonary: CTA B/L; no W/R/R- good air movement GI: soft, NT, ND, (+)BS- hypoactive Psychiatric: appropriate- can tell is a little stressed, but calm, bright Neurological: Ox3- MAS of 2 in LEs Skin: Warm and dry.  Intact. Musc: No edema in extremities.  No tenderness in extremities. Motor: Bilateral upper extremities: 4 -/5 proximal distal, unchanged Right lower extremity: Hip flexion, knee extension 2+/5, ankle dorsiflexion 4/5, stable Left lower extremity: Hip flexion, knee extension 3 -/5, ankle dorsiflexion 4/5, no changes  Assessment/Plan: 1. Functional deficits which require 3+ hours per day of interdisciplinary therapy in a comprehensive inpatient rehab setting.  Physiatrist is providing close team supervision and 24 hour management of active medical problems listed below.  Physiatrist and rehab  team continue to assess barriers to discharge/monitor patient progress toward functional and medical goals  Care Tool:  Bathing    Body parts bathed by patient: Right arm,Left arm,Chest,Abdomen,Front perineal area,Buttocks,Right upper leg,Left upper leg,Face   Body parts bathed by helper: Right lower leg,Left lower leg Body parts n/a: Left lower leg,Right lower leg   Bathing assist Assist Level: Minimal Assistance - Patient > 75%     Upper Body Dressing/Undressing Upper body dressing   What is the patient wearing?: Pull over shirt    Upper body assist Assist Level: Set up assist    Lower Body Dressing/Undressing Lower body dressing      What is the patient wearing?: Incontinence brief,Pants     Lower body assist Assist for lower body dressing: Minimal Assistance - Patient > 75%     Toileting Toileting    Toileting assist Assist for toileting: Minimal Assistance - Patient > 75%     Transfers Chair/bed transfer  Transfers assist     Chair/bed transfer assist level: Contact Guard/Touching assist Chair/bed transfer assistive device:   Ambulation assist   Ambulation activity did not occur: Safety/medical concerns  Assist level: Contact Guard/Touching assist Assistive device: Walker-rolling Max distance: 200   Walk 10 feet activity   Assist  Walk 10 feet activity did not occur: Safety/medical concerns  Assist level: Contact Guard/Touching assist Assistive device: Walker-rolling   Walk 50 feet activity   Assist Walk 50 feet with 2 turns activity did not occur: Safety/medical concerns  Assist level: Contact Guard/Touching assist Assistive device: Walker-rolling    Walk 150 feet activity   Assist Walk 150 feet activity did not occur: Safety/medical concerns  Assist level: Contact Guard/Touching assist Assistive device: Walker-rolling    Walk 10 feet on uneven surface  activity   Assist Walk 10 feet on uneven  surfaces activity did not occur: Safety/medical concerns         Wheelchair     Assist Will patient use wheelchair at discharge?: Yes Type of Wheelchair: Manual    Wheelchair assist level: Supervision/Verbal cueing Max wheelchair distance: 120    Wheelchair 50 feet with 2 turns activity    Assist        Assist Level: Supervision/Verbal cueing   Wheelchair 150 feet activity     Assist      Assist Level: Supervision/Verbal cueing   Blood pressure 102/64, pulse 91, temperature 98.1 F (36.7 C), temperature source Oral, resp. rate 18, height 5\' 7"  (1.702 m), weight 81.8 kg, SpO2 93 %.  Medical Problem List and Plan: 1.  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  Continue CIR  3/28- d/c to SNF tomorrow- con't PT and OT today  B/L AFOs consult for pt due to B/L foot drop 2.  Antithrombotics:  -DVT/anticoagulation:  Pharmaceutical:   Lovenox - will need 3 months from date of surgery since not walking far enough.              -antiplatelet therapy: N/A 3. Pain Management: Continue Oxycodone prn.  Off Lyrica- on Duloxetine 60 mg QHS, increase to duloxetine to 90 mg QHS since can't do Gabapentin or Lyrica.   Controlled with meds on 3/26  3/28- pain controlled con't regimen 4. Mood: LCSW to follow for evaluation and support.   Appreciate neuropsych follow-up             -antipsychotic agents: N/A 5. Neuropsych: This patient is capable of making decisions on her own behalf. 6. Skin/Wound Care:  Monitor incision for healing.  Added protein supplement to help promote healing.  7. Fluids/Electrolytes/Nutrition: Monitor I/Os.  8. ABLA/macrocytosis:   Vitamin B12 173, supplement initiated 9. Hypokalemia/Hypomagnesemia: Has resolved with brief supplementation.   Potassium 4.5 on 3/22 10. Polysubstance abuse:   Continue to counsel  11. Constipation: KUB ordered to  determine stool burden.  Bowel program with dig stim nightly   Added Senokot 1 tab, changed to senna S2 tabs nightly on 3/13  3/28- still having regular BMs- will need to con't oral meds for bowel program after d/c.   12. Neurogenic bladder  Some improvement with timed voiding, however remains incontinent  3/15- says a little better with Enablex- con't regimen  See #18 13. Neuropathic pain  Lyrica increased Lyrica to 75 mg TID, then DC'd due to edema  3/23- con't Duloxetine  Controlled on 3/26 14. Spasticity  Baclofen to 15 mg TID since spasms aworse at night, with night time dose of Baclofen to 20 mg QHS.   Added Dantrolene to allergy list  Plan to increase Zanaflex on Monday slightly  3/28- will increase Zanaflex to 4 mg BID- if too sedated, decrease BACK to 2 mg BID- will need BOTH Baclofen and Zanaflex due  to severe spasticity.  15. Dry skin: Eucerin ordered, encouraged use.  16. B/L elbow/arm swelling  Improved off of Lyrica 17. Dysphagia  D 3 thins, continue to advance as tolerated 18. Urinary urgency/frequency  Trial Myrbetriq 25 mg QHS for urinary frequency,?  DC'd on 2/25  Added Flomax,?  DC'd on 3/2  Continue Enablex  3/28- voiding OK- incontinence at night- con't regimen  See #13 20. Severe calluses on Feet  2/27- feet warmed with warm wet washcloth, then lotion applied, then vaseline over that BID- to help cracks/calluses.   21. New UE weakness  Cervical MRI improved  Improving back to baseline 22. Dizziness  Possibly from Dantrolene addition- will stop and monitor-decreased Nicoderm which too much nicotine can make her feel bad as well.   3/23- resolved- from dantrolene- made allergy.   LOS: 40 days A FACE TO FACE EVALUATION WAS PERFORMED  Muhannad Bignell 08/20/2020, 1:33 PM

## 2020-08-20 NOTE — Progress Notes (Addendum)
Patient ID: Aleida Crandell, female   DOB: July 14, 1960, 60 y.o.   MRN: [id number]  SW received updates from [name] Faison/APS Caseworker stating Jimmia Trapp/Placement specialist requesting clinical documents. SW faxed items [phone].  SW received updates from [employer] that a bed offer was extended by [facility]) and that they will cover costs until pt Medicaid becomes active. SW left message for Tressa Busman [phone]) to inquire about when admission is possible. SW waiting on follow-up.  *SW spoke with Tressa Busman [phone]) to discuss admission. Able to accept pt tomorrow. Will verify if pt has had COVID vaccine, if not, will have 7 day quarantine under new guidelines; no COVID test needed any longer.   [facility] ambulance scheduled. for 10am pick up on 3/29.   Pt has COVID vaccine and booster. SW provided updates to SNF-[facility] and [name] Faison.   SW met with pt in room to discuss above. Pt will  [name], MSW, [place] Office: [phone] Cell: [phone] Fax: [phone]

## 2020-08-20 NOTE — Progress Notes (Signed)
Inpatient Rehabilitation Care Coordinator Discharge Note  The overall goal for the admission was met for:   Discharge location: Yes. D/c to SNF [facility]. Rom #306A; nurse report [phone]; [facility] APS caseworker following pt and stay to be covered by APS until Medicaid is active.  Length of Stay: Yes. 41 days.   Discharge activity level: Yes. Min A  Home/community participation: Yes. Limited.   Services provided included: MD, RD, PT, OT, RN, CM, TR, Pharmacy, Neuropsych and SW  Financial Services: Other: Uninsured  Choices offered to/list presented to:Yes  Follow-up services arranged: N/A  Comments (or additional information):  Patient/Family verbalized understanding of follow-up arrangements: Yes  Individual responsible for coordination of the follow-up plan: Pt to have assistance.   Confirmed correct DME delivered: Rana Snare 08/20/2020    Rana Snare

## 2020-08-20 NOTE — Progress Notes (Signed)
Physical Therapy Session Note  Patient Details  Name: Yvette Phillips MRN: [id number] Date of Birth: August 28, 1960  Today's Date: 08/20/2020 PT Individual Time: 1450-1535 PT Individual Time Calculation (min): 45 min   Short Term Goals: Week 1:  PT Short Term Goal 1 (Week 1): pt to demonstrate supine<>sit mod A PT Short Term Goal 1 - Progress (Week 1): Met PT Short Term Goal 2 (Week 1): pt to demonstrate functional transfer with LRAD at mod A PT Short Term Goal 2 - Progress (Week 1): Met PT Short Term Goal 3 (Week 1): pt to demonstrate gait training with LRAD for 20' PT Short Term Goal 3 - Progress (Week 1): Progressing toward goal PT Short Term Goal 4 (Week 1): pt to demonstrate standing balance at mod A for at least 3 mins PT Short Term Goal 4 - Progress (Week 1): Met Week 2:  PT Short Term Goal 1 (Week 2): Pt will complete bed mobility with min A consistently PT Short Term Goal 1 - Progress (Week 2): Met PT Short Term Goal 2 (Week 2): Pt will perform transfers with LRAD and min A consistently PT Short Term Goal 2 - Progress (Week 2): Met PT Short Term Goal 3 (Week 2): Pt will ambulate x 50 ft with LRAD and mod A PT Short Term Goal 3 - Progress (Week 2): Met Week 3:  PT Short Term Goal 1 (Week 3): Pt will perform bed mobility with supervision. PT Short Term Goal 1 - Progress (Week 3): Progressing toward goal PT Short Term Goal 2 (Week 3): Pt will  ambulate with LRAD and min A x 100 ft. PT Short Term Goal 2 - Progress (Week 3): Met PT Short Term Goal 3 (Week 3): Pt will propel wc with supervision x 100 ft. PT Short Term Goal 3 - Progress (Week 3): Met  Skilled Therapeutic Interventions/Progress Updates:    Pain:  Pt reports back pain.  Treatment to tolerance.  Rest breaks and repositioning as needed.  Pt initially oob in recliner and agreeable to treatment session. Sit to stand from recliner w/min assist to RW.  Gait x 10-65f w/RW, cga, overall flexed posture gait.  Commode transfer  w/cga.  Pt incontnent of urine in brief, continent of bowels. Mod assist overall for pericare in standing w/RW, min assist for balance w/post tendency while raising pants/changing brief. Gait to sink and stood w/RW to wash hands w/cga.   wc propulsion >553fw/bilat UEs mod I.  Ascended/descended 8 3in steps w/2 rails w/cga.  Standing balance/grip strength - Standing reaching overhead to clip plastic clothespins on baskeball hoop at highest setting w/single to no UE support on walker and cga.  Short distance gait and transfer to recliner w/cga and rw. Pt left oob in recliner w/chair alarm set and needs in reach.  Therapy Documentation Precautions:  Precautions Precautions: Fall,Cervical Precaution Comments: s/p ACDF C4-5 (no cervical brace needed per acute), BUE/LE weakness Restrictions Weight Bearing Restrictions: No    Therapy/Group: Individual Therapy  BaCallie FieldingPTBlanchard/28/2022, 3:42 PM

## 2020-08-20 NOTE — Plan of Care (Signed)
  Problem: Consults Goal: RH SPINAL CORD INJURY PATIENT EDUCATION Description:  See Patient Education module for education specifics.  Outcome: Progressing Goal: Skin Care Protocol Initiated - if Braden Score 18 or less Description: If consults are not indicated, leave blank or document N/A Outcome: Progressing   Problem: SCI BOWEL ELIMINATION Goal: RH STG MANAGE BOWEL WITH ASSISTANCE Description: STG Manage Bowel with Mod I Assistance. Outcome: Progressing Goal: RH STG SCI MANAGE BOWEL WITH MEDICATION WITH ASSISTANCE Description: STG SCI Manage bowel with medication with Mod I assistance. Outcome: Progressing   Problem: SCI BLADDER ELIMINATION Goal: RH STG MANAGE BLADDER WITH ASSISTANCE Description: STG Manage Bladder With Mod I Assistance Outcome: Progressing   Problem: RH SAFETY Goal: RH STG ADHERE TO SAFETY PRECAUTIONS W/ASSISTANCE/DEVICE Description: STG Adhere to Safety Precautions With Mod I Assistance/Device. Outcome: Progressing   Problem: RH PAIN MANAGEMENT Goal: RH STG PAIN MANAGED AT OR BELOW PT'S PAIN GOAL Description: <3 on a 0-10 pain scale. Outcome: Progressing   Problem: RH KNOWLEDGE DEFICIT SCI Goal: RH STG INCREASE KNOWLEDGE OF SELF CARE AFTER SCI Description: Patient will demonstrate knowledge of medication management, bowel and bladder care, skin and wound care, dietary management with educational materials and handouts provided by staff. Outcome: Progressing   

## 2020-08-21 LAB — SARS CORONAVIRUS 2 (TAT 6-24 HRS): SARS Coronavirus 2: NEGATIVE

## 2020-08-21 NOTE — Progress Notes (Signed)
Occupational Therapy Discharge Summary  Patient Details  Name: Yvette Phillips MRN: [id number] Date of Birth: 14-Mar-1961  Patient has met 67 of 10 long term goals due to improved activity tolerance, improved balance, ability to compensate for deficits, functional use of  RIGHT upper, RIGHT lower, LEFT upper and LEFT lower extremity and improved coordination.  Pt made steady progress with BADLs during this admission. Pt performs functional transfers with supervsion. Pt completes UB bathing/dressing and self feeding tasks with supervision. Pt requires min A for LB bathing/dressing and toileting tasks. Pt discharging to SNF for continued rehab.Patient to discharge at overall supervision to min A  level.  Patient's care partner requires assistance to provide the necessary physical assistance at discharge.    Recommendation:  Patient will benefit from ongoing skilled OT services in skilled nursing facility setting to continue to advance functional skills in the area of BADL and Reduce care partner burden.  Equipment: No equipment provided  Reasons for discharge: treatment goals met and discharge from hospital  Patient/family agrees with progress made and goals achieved: Yes  OT Discharge Vision Baseline Vision/History: No visual deficits Patient Visual Report: No change from baseline Vision Assessment?: No apparent visual deficits Perception  Perception: Within Functional Limits Praxis Praxis: Intact Cognition Overall Cognitive Status: Within Functional Limits for tasks assessed Arousal/Alertness: Awake/alert Orientation Level: Oriented X4 Attention: Focused;Sustained Focused Attention: Appears intact Sustained Attention: Appears intact Memory: Appears intact Awareness: Appears intact Safety/Judgment: Appears intact Sensation Sensation Light Touch: Appears Intact Hot/Cold: Appears Intact Proprioception: Appears Intact Stereognosis: Not tested Coordination Gross Motor Movements are  Fluid and Coordinated: No Fine Motor Movements are Fluid and Coordinated: No Motor  Motor Motor: Abnormal postural alignment and control;Tetraplegia Motor - Skilled Clinical Observations: decreased GM/FMC in BUEs, weakness in BLEs as well Trunk/Postural Assessment  Cervical Assessment Cervical Assessment:  (cervical precautions) Thoracic Assessment Thoracic Assessment:  (mildly kyphotic) Lumbar Assessment Lumbar Assessment:  (posterior pelvic tilt)  Balance Static Sitting Balance Static Sitting - Balance Support: Feet supported Static Sitting - Level of Assistance: 6: Modified independent (Device/Increase time) Dynamic Sitting Balance Dynamic Sitting - Balance Support: During functional activity Dynamic Sitting - Level of Assistance: 5: Stand by assistance Extremity/Trunk Assessment RUE Assessment RUE Assessment: Exceptions to [facility] Passive Range of Motion (PROM) Comments: WFL Active Range of Motion (AROM) Comments: WFL General Strength Comments: roughly 4/5 shoulders and bicep, 4-/5 tricep, roughly 3+/5 grip LUE Assessment LUE Assessment: Exceptions to [facility] Passive Range of Motion (PROM) Comments: WFL Active Range of Motion (AROM) Comments: WFL General Strength Comments: roughly 4/5 shoulders and bicep, 4-/5 tricep, roughly 3+/5 grip   Leroy Libman 08/21/2020, 6:27 AM

## 2020-08-21 NOTE — Progress Notes (Signed)
PROGRESS NOTE   Subjective/Complaints:  Pt reports tightness/spasms a little better with increase in Zanaflex- explained to her she needs to let SNF know that she HAS to take both Baclofen and Zanaflex.    ROS:   Pt denies SOB, abd pain, CP, N/V/C/D, and vision changes  Objective:   No results found. No results for input(s): WBC, HGB, HCT, PLT in the last 72 hours. No results for input(s): NA, K, CL, CO2, GLUCOSE, BUN, CREATININE, CALCIUM in the last 72 hours.  Intake/Output Summary (Last 24 hours) at 08/21/2020 1042 Last data filed at 08/21/2020 0854 Gross per 24 hour  Intake 960 ml  Output --  Net 960 ml        Physical Exam: Vital Signs Blood pressure 128/75, pulse 75, temperature (!) 97.4 F (36.3 C), resp. rate 15, height 5\' 7"  (1.702 m), weight 81.8 kg, SpO2 100 %.    General: awake, alert, appropriate, sitting up in bed- tearful, NAD HENT: conjugate gaze; oropharynx moist CV: regular rate; no JVD Pulmonary: CTA B/L; no W/R/R- good air movement GI: soft, NT, ND, (+)BS Psychiatric: appropriate but tearful about leaving Neurological: Ox3- MAS ~2 Skin: Warm and dry.  Intact. Musc: No edema in extremities.  No tenderness in extremities. Motor: Bilateral upper extremities: 4 -/5 proximal distal, unchanged Right lower extremity: Hip flexion, knee extension 2+/5, ankle dorsiflexion 4/5, stable Left lower extremity: Hip flexion, knee extension 3 -/5, ankle dorsiflexion 4/5, no changes  Assessment/Plan: 1. Functional deficits which require 3+ hours per day of interdisciplinary therapy in a comprehensive inpatient rehab setting.  Physiatrist is providing close team supervision and 24 hour management of active medical problems listed below.  Physiatrist and rehab team continue to assess barriers to discharge/monitor patient progress toward functional and medical goals  Care Tool:  Bathing    Body parts bathed  by patient: Right arm,Left arm,Chest,Abdomen,Front perineal area,Buttocks,Right upper leg,Left upper leg,Face   Body parts bathed by helper: Right lower leg,Left lower leg Body parts n/a: Left lower leg,Right lower leg   Bathing assist Assist Level: Minimal Assistance - Patient > 75%     Upper Body Dressing/Undressing Upper body dressing   What is the patient wearing?: Pull over shirt    Upper body assist Assist Level: Set up assist    Lower Body Dressing/Undressing Lower body dressing      What is the patient wearing?: Incontinence brief,Pants     Lower body assist Assist for lower body dressing: Minimal Assistance - Patient > 75%     Toileting Toileting    Toileting assist Assist for toileting: Contact Guard/Touching assist     Transfers Chair/bed transfer  Transfers assist     Chair/bed transfer assist level: Contact Guard/Touching assist Chair/bed transfer assistive device:   Ambulation assist   Ambulation activity did not occur: Safety/medical concerns  Assist level: Contact Guard/Touching assist Assistive device: Walker-rolling Max distance: 200   Walk 10 feet activity   Assist  Walk 10 feet activity did not occur: Safety/medical concerns  Assist level: Contact Guard/Touching assist Assistive device: Walker-rolling   Walk 50 feet activity   Assist Walk 50 feet with 2 turns  activity did not occur: Safety/medical concerns  Assist level: Contact Guard/Touching assist Assistive device: Walker-rolling    Walk 150 feet activity   Assist Walk 150 feet activity did not occur: Safety/medical concerns  Assist level: Contact Guard/Touching assist Assistive device: Walker-rolling    Walk 10 feet on uneven surface  activity   Assist Walk 10 feet on uneven surfaces activity did not occur: Safety/medical concerns         Wheelchair     Assist Will patient use wheelchair at discharge?: Yes Type of  Wheelchair: Manual    Wheelchair assist level: Independent Max wheelchair distance: 150'    Wheelchair 50 feet with 2 turns activity    Assist        Assist Level: Independent   Wheelchair 150 feet activity     Assist      Assist Level: Independent   Blood pressure 128/75, pulse 75, temperature (!) 97.4 F (36.3 C), resp. rate 15, height 5\' 7"  (1.702 m), weight 81.8 kg, SpO2 100 %.  Medical Problem List and Plan: 1.  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  Continue CIR  3/28- d/c to SNF tomorrow- con't PT and OT today  B/L AFOs consult for pt due to B/L foot drop  3/29- d/c today- 2.  Antithrombotics:  -DVT/anticoagulation:  Pharmaceutical:   Lovenox - will need 3 months from date of surgery since not walking far enough.              -antiplatelet therapy: N/A 3. Pain Management: Continue Oxycodone prn.  Off Lyrica- on Duloxetine 60 mg QHS, increase to duloxetine to 90 mg QHS since can't do Gabapentin or Lyrica.   Controlled with meds on 3/26  3/28- pain controlled con't regimen  3/29- con't regimen 4. Mood: LCSW to follow for evaluation and support.   Appreciate neuropsych follow-up             -antipsychotic agents: N/A 5. Neuropsych: This patient is capable of making decisions on her own behalf. 6. Skin/Wound Care:  Monitor incision for healing.  Added protein supplement to help promote healing.  7. Fluids/Electrolytes/Nutrition: Monitor I/Os.  8. ABLA/macrocytosis:   Vitamin B12 173, supplement initiated 9. Hypokalemia/Hypomagnesemia: Has resolved with brief supplementation.   Potassium 4.5 on 3/22 10. Polysubstance abuse:   Continue to counsel  11. Constipation: KUB ordered to determine stool burden.  Bowel program with dig stim nightly   Added Senokot 1 tab, changed to senna S2 tabs nightly on 3/13  3/28- still having regular BMs- will need to  con't oral meds for bowel program after d/c.   12. Neurogenic bladder  Some improvement with timed voiding, however remains incontinent  3/15- says a little better with Enablex- con't regimen  See #18 13. Neuropathic pain  Lyrica increased Lyrica to 75 mg TID, then DC'd due to edema  3/23- con't Duloxetine  Controlled on 3/26 14. Spasticity  Baclofen to 15 mg TID since spasms aworse at night, with night time dose of Baclofen to 20 mg QHS.   Added Dantrolene to allergy list  Plan to increase Zanaflex on Monday slightly  3/28- will increase Zanaflex to 4 mg BID- if too sedated, decrease BACK to 2 mg BID- will need BOTH Baclofen and Zanaflex due to severe spasticity.   3/29- don't stop Zanaflex- can reduce at most 2 mg BID- if gets too sedated. 15. Dry skin: Eucerin ordered, encouraged use.  16. B/L  elbow/arm swelling  Improved off of Lyrica 17. Dysphagia  D 3 thins, continue to advance as tolerated 18. Urinary urgency/frequency  Trial Myrbetriq 25 mg QHS for urinary frequency,?  DC'd on 2/25  Added Flomax,?  DC'd on 3/2  Continue Enablex  3/28- voiding OK- incontinence at night- con't regimen  See #13 20. Severe calluses on Feet  2/27- feet warmed with warm wet washcloth, then lotion applied, then vaseline over that BID- to help cracks/calluses.   21. New UE weakness  Cervical MRI improved  Improving back to baseline 22. Dizziness  Possibly from Dantrolene addition- will stop and monitor-decreased Nicoderm which too much nicotine can make her feel bad as well.   3/23- resolved- from dantrolene- made allergy.   LOS: 41 days A FACE TO FACE EVALUATION WAS PERFORMED  Yvette Phillips 08/21/2020, 10:42 AM

## 2020-08-21 NOTE — Progress Notes (Signed)
Patient ID: Yvette Phillips, female   DOB: 1960/10/26, 60 y.o.   MRN: [id number]  SW sent d/c summary to [place] in [place] via Epic. When trying to get Room# and Nurse report # from [facility]/ADministrator at [place], reported that t hey have not received a response for invoice or payment for placement, and unable to accept without payment. SW left message for [name] APS caseworker [phone] cell/f:[phone]) to discuss above and sent an email. SW updated medical team on above. D/c packet provided to pt assigned RN and will update on changes to pt pick up time.  D/c pick up time changed to 12:30pm with [facility] to [facility].   *Pt can d/c today as payment received by SNF. Medical team updated. Pt going to Rm#[id number]; nurse report [phone].  Cecile Sheerer, MSW, LCSWA Office: [phone] Cell: [phone] Fax: [phone]

## 2020-08-21 NOTE — Plan of Care (Signed)
  Problem: Consults Goal: RH SPINAL CORD INJURY PATIENT EDUCATION Description:  See Patient Education module for education specifics.  Outcome: Progressing Goal: Skin Care Protocol Initiated - if Braden Score 18 or less Description: If consults are not indicated, leave blank or document N/A Outcome: Progressing   Problem: SCI BOWEL ELIMINATION Goal: RH STG MANAGE BOWEL WITH ASSISTANCE Description: STG Manage Bowel with Mod I Assistance. Outcome: Progressing Goal: RH STG SCI MANAGE BOWEL WITH MEDICATION WITH ASSISTANCE Description: STG SCI Manage bowel with medication with Mod I assistance. Outcome: Progressing   Problem: SCI BLADDER ELIMINATION Goal: RH STG MANAGE BLADDER WITH ASSISTANCE Description: STG Manage Bladder With Mod I Assistance Outcome: Progressing   Problem: RH SAFETY Goal: RH STG ADHERE TO SAFETY PRECAUTIONS W/ASSISTANCE/DEVICE Description: STG Adhere to Safety Precautions With Mod I Assistance/Device. Outcome: Progressing   Problem: RH PAIN MANAGEMENT Goal: RH STG PAIN MANAGED AT OR BELOW PT'S PAIN GOAL Description: <3 on a 0-10 pain scale. Outcome: Progressing   Problem: RH KNOWLEDGE DEFICIT SCI Goal: RH STG INCREASE KNOWLEDGE OF SELF CARE AFTER SCI Description: Patient will demonstrate knowledge of medication management, bowel and bladder care, skin and wound care, dietary management with educational materials and handouts provided by staff. Outcome: Progressing   

## 2020-08-23 ENCOUNTER — Telehealth: Payer: Self-pay | Admitting: Registered Nurse

## 2020-08-23 NOTE — Telephone Encounter (Signed)
TC Call placed no answer. Will call [facility].

## 2020-08-23 NOTE — Telephone Encounter (Signed)
Call placed to [name], Ms. Barcia was scheduled for HFU appointment on 08/29/2020 to arrive at 10:40 for 11:00 appointment, This provider spoke to April Crayton.

## 2020-08-29 ENCOUNTER — Encounter: Payer: Self-pay | Admitting: Registered Nurse

## 2020-08-29 ENCOUNTER — Encounter: Payer: Medicaid Other | Attending: Registered Nurse | Admitting: Registered Nurse

## 2020-08-29 ENCOUNTER — Other Ambulatory Visit: Payer: Self-pay

## 2020-08-29 VITALS — BP 100/67 | HR 79 | Temp 98.8°F | Ht 67.5 in

## 2020-08-29 DIAGNOSIS — S14124D Central cord syndrome at C4 level of cervical spinal cord, subsequent encounter: Secondary | ICD-10-CM | POA: Insufficient documentation

## 2020-08-29 DIAGNOSIS — Z981 Arthrodesis status: Secondary | ICD-10-CM | POA: Insufficient documentation

## 2020-08-29 DIAGNOSIS — K592 Neurogenic bowel, not elsewhere classified: Secondary | ICD-10-CM | POA: Insufficient documentation

## 2020-08-29 DIAGNOSIS — N319 Neuromuscular dysfunction of bladder, unspecified: Secondary | ICD-10-CM | POA: Insufficient documentation

## 2020-08-29 NOTE — Patient Instructions (Addendum)
Yvette Phillips need to have schedul appointment with Dr. Alroy Bailiff:  Hospital Follow Up Appointment. [phone]   She also needs a scheduled appointment with Dr. Sherron Monday : Urologist [phone]

## 2020-08-29 NOTE — Progress Notes (Signed)
Subjective:    Patient ID: Yvette Phillips, female    DOB: August 23, 1960, 60 y.o.   MRN: [id number]  HPI: Yvette Phillips is a 60 y.o. 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 Yvette Phillips was brought to [facility] on 07/03/2020 via EMS after being found down in her apartment. She stated she had a fall on Christmas Eve was brought to [facility] and was diagnosed with Central Cord Syndrome. She was discharged home on 05/26/2020 with outpatient neurosurgery follow up appointment per H&P.  She continued to have progressively worsening weakness in her bilateral upper and bilateral lower extremities. She was brought to [facility] on 07/03/2020 after a well check via EMS. Neurosurgery was consulted.  MR Brain WO Contrast:  IMPRESSION: 1. No acute intracranial abnormality. 2. 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 MR Cervical Spine: WO Contrast:  IMPRESSION: 1. 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 2. Severe right C4-5 neural foraminal stenosis.  Yvette Phillips underwent :ANTERIOR CERVICAL DECOMPRESSION/DISCECTOMY FUSION 1 LEVEL, by Dr Myer Haff on 07/04/2020.   Yvette Phillips was admitted to inpatient rehabilitation on 07/11/2020 and discharged to [facility] 08/21/2020. She denies any pain. She rated her pain on Health and History 4. Also reports she has a good appetite.   Yvette Phillips arrived in wheelchair with care taker in room.   Pain Inventory Average Pain 6 Pain Right Now 4 My pain is intermittent, dull and aching  In the last 24 hours, has pain interfered with the following? General activity 0 Relation with others 0 Enjoyment of life 0 What TIME of day is your pain at its worst? daytime Sleep (in general) Good  Pain is worse  with: walking, bending, sitting, standing and some activites Pain improves with: rest, therapy/exercise, medication and injections Relief from Meds: 10  use a walker how many minutes can you walk? Unknown ability to climb steps?  yes do you drive?  no use a wheelchair needs help with transfers Do you have any goals in this area?  yes  employed # of hrs/week Medial leave from Group Home I need assistance with the following:  meal prep, household duties and shopping Do you have any goals in this area?  yes  weakness numbness tingling trouble walking spasms dizziness  Any changes since last visit?  no New Patient  Any changes since last visit?  no New Patient    Family History  Problem Relation Age of Onset  . Diabetes Mother   . Diabetes Father   . Heart disease Neg Hx    Social History   Socioeconomic History  . Marital status: Single    Spouse name: Not on file  . Number of children: Not on file  . Years of education: Not on file  . Highest education level: Not on file  Occupational History  . Not on file  Tobacco Use  . Smoking status: Current Every Day Smoker    Packs/day: 0.50    Years: 15.00    Pack years: 7.50  . Smokeless tobacco: Never Used  Vaping Use  . Vaping Use: Never used  Substance and Sexual Activity  . Alcohol use: Yes    Comment: occassionally  . Drug use: Not Currently  . Sexual activity: Not Currently  Other Topics Concern  .  Not on file  Social History Narrative  . Not on file   Social Determinants of Health   Financial Resource Strain: Not on file  Food Insecurity: Not on file  Transportation Needs: Not on file  Physical Activity: Not on file  Stress: Not on file  Social Connections: Not on file   Past Surgical History:  Procedure Laterality Date  . ANTERIOR CERVICAL DECOMP/DISCECTOMY FUSION N/A 07/04/2020   Procedure: ANTERIOR CERVICAL DECOMPRESSION/DISCECTOMY FUSION 1 LEVEL;  Surgeon: Venetia Night, MD;  Location:  [facility] ORS;  Service: Neurosurgery;  Laterality: N/A;   History reviewed. No pertinent past medical history. BP 100/67   Pulse 79   Temp 98.8 F (37.1 C)   Ht 5' 7.5" (1.715 m)   SpO2 97%   BMI 27.83 kg/m   Opioid Risk Score:   Fall Risk Score:  `1  Depression screen PHQ 2/9  Depression screen PHQ 2/9 08/29/2020  Decreased Interest 0  Down, Depressed, Hopeless 0  PHQ - 2 Score 0  Altered sleeping 0  Tired, decreased energy 0  Change in appetite 0  Feeling bad or failure about yourself  0  Trouble concentrating 0  Moving slowly or fidgety/restless 0  Suicidal thoughts 0  PHQ-9 Score 0   Review of Systems  Constitutional: Positive for unexpected weight change.  Musculoskeletal: Positive for gait problem.  Neurological: Positive for dizziness, weakness and numbness. Negative for tremors.  All other systems reviewed and are negative.      Objective:   Physical Exam Vitals and nursing note reviewed.  Constitutional:      Appearance: Normal appearance.  Cardiovascular:     Rate and Rhythm: Normal rate and regular rhythm.     Pulses: Normal pulses.     Heart sounds: Normal heart sounds.  Pulmonary:     Effort: Pulmonary effort is normal.     Breath sounds: Normal breath sounds.  Musculoskeletal:     Cervical back: Normal range of motion and neck supple.     Comments: Normal Muscle Bulk and Muscle Testing Reveals:  Upper Extremities: Full ROM and Muscle Strength 3/5 Lower Extremities: Decreased ROM and Muscle Strength 3/5  Wearing Right AFO Arrived in wheelchair   Skin:    General: Skin is warm and dry.  Neurological:     Mental Status: She is oriented to person, place, and time.  Psychiatric:        Mood and Affect: Mood normal.        Behavior: Behavior normal.           Assessment & Plan:  1. C4 level of Cervical Spinal Cord,: S/P Cervical Spinal Fusion: Note was writted to SNF to scheduled an appointment with Dr Marcell Barlow. She verbalizes understanding.   2. Neurogenic Bladder and Neurogenic Bowel.Continue current medication regimen. Continue to monitor.   F/U with Dr Berline Chough in 4- 6 weeks

## 2020-10-19 ENCOUNTER — Encounter: Payer: Medicaid Other | Admitting: Physical Medicine and Rehabilitation

## 2020-10-29 ENCOUNTER — Ambulatory Visit: Payer: Medicaid Other | Admitting: Urology

## 2020-11-05 ENCOUNTER — Other Ambulatory Visit: Payer: Self-pay

## 2020-11-05 ENCOUNTER — Ambulatory Visit (INDEPENDENT_AMBULATORY_CARE_PROVIDER_SITE_OTHER): Payer: Medicaid Other | Admitting: Urology

## 2020-11-05 ENCOUNTER — Encounter: Payer: Self-pay | Admitting: Urology

## 2020-11-05 VITALS — BP 127/77 | HR 77

## 2020-11-05 DIAGNOSIS — N3946 Mixed incontinence: Secondary | ICD-10-CM

## 2020-11-05 DIAGNOSIS — K592 Neurogenic bowel, not elsewhere classified: Secondary | ICD-10-CM

## 2020-11-05 DIAGNOSIS — R1314 Dysphagia, pharyngoesophageal phase: Secondary | ICD-10-CM

## 2020-11-05 LAB — BLADDER SCAN AMB NON-IMAGING: Scan Result: 5

## 2020-11-05 MED ORDER — MIRABEGRON ER 50 MG PO TB24: 50.0000 mg | ORAL_TABLET | Freq: Every day | ORAL | 11 refills | Status: DC

## 2020-11-05 NOTE — Progress Notes (Signed)
11/05/2020 1:53 PM   Yvette Phillips [birth year] [id number]  Referring provider: No referring provider defined for this encounter.  Chief Complaint  Patient presents with   Urinary Retention    HPI: Was consulted to assess the patient's urinary incontinence.  Some of the details were difficult.  It appears she had a cervical fusion February 2022.  It is thought that she has a neurogenic bladder and bowel.  She is on darifenacin based upon medical record.  It looks like she had a positive urine culture twice in March 2022  Her primary complaint is bedwetting.  It is high-volume.  She can toilet quite well and transfer from a wheelchair during the day and generally is not leaking.  No cystitis symptoms.  No bladder surgery or kidney stones     PMH: No past medical history on file.  Surgical History: Past Surgical History:  Procedure Laterality Date   ANTERIOR CERVICAL DECOMP/DISCECTOMY FUSION N/A 07/04/2020   Procedure: ANTERIOR CERVICAL DECOMPRESSION/DISCECTOMY FUSION 1 LEVEL;  Surgeon: Venetia Night, MD;  Location: [facility] ORS;  Service: Neurosurgery;  Laterality: N/A;    Home Medications:  Allergies as of 11/05/2020       Reactions   Codeine Anaphylaxis   Penicillins Anaphylaxis   Dantrolene Other (See Comments)   Pt had confusion, dizziness and "off" with 50 mg Dantrolene   Lyrica [pregabalin] Other (See Comments)   Edema        Medication List        Accurate as of November 05, 2020  1:53 PM. If you have any questions, ask your nurse or doctor.          acetaminophen 325 MG tablet Commonly known as: TYLENOL Take 1-2 tablets (325-650 mg total) by mouth every 4 (four) hours as needed for mild pain.   baclofen 20 MG tablet Commonly known as: LIORESAL Take 1 tablet (20 mg total) by mouth at bedtime.   Baclofen 5 MG Tabs Take 15 mg by mouth 3 (three) times daily.   bisacodyl 10 MG suppository Commonly known as: DULCOLAX Place 1 suppository (10 mg total)  rectally daily as needed for moderate constipation (no bowel program anymore).   cyanocobalamin 2000 MCG tablet Take 1 tablet (2,000 mcg total) by mouth daily.   darifenacin 7.5 MG 24 hr tablet Commonly known as: ENABLEX Take 1 tablet (7.5 mg total) by mouth daily.   DULoxetine 30 MG capsule Commonly known as: CYMBALTA Take 3 capsules (90 mg total) by mouth at bedtime.   enoxaparin 40 MG/0.4ML injection Commonly known as: LOVENOX Inject 0.4 mLs (40 mg total) into the skin daily.   famotidine 20 MG tablet Commonly known as: PEPCID Take 1 tablet (20 mg total) by mouth at bedtime.   folic acid 1 MG tablet Commonly known as: FOLVITE Take 1 tablet (1 mg total) by mouth daily.   Gerhardt's butt cream Crea Apply 1 application topically 3 (three) times daily.   hydrocerin Crea Apply 1 application topically 2 (two) times daily.   multivitamin with minerals Tabs tablet Take 1 tablet by mouth daily.   nicotine 7 mg/24hr patch Commonly known as: NICODERM CQ - dosed in mg/24 hr Place 1 patch (7 mg total) onto the skin daily.   polyethylene glycol 17 g packet Commonly known as: MIRALAX / GLYCOLAX Take 17 g by mouth 2 (two) times daily.   senna-docusate 8.6-50 MG tablet Commonly known as: Senokot-S Take 2 tablets by mouth at bedtime.   thiamine 100 MG tablet Take  1 tablet (100 mg total) by mouth daily.   tiZANidine 4 MG tablet Commonly known as: ZANAFLEX Take 1 tablet (4 mg total) by mouth 2 (two) times daily.   traZODone 50 MG tablet Commonly known as: DESYREL Take 0.5-1 tablets (25-50 mg total) by mouth at bedtime as needed for sleep.   white petrolatum Oint Commonly known as: VASELINE Apply 1 application topically 2 (two) times daily.        Allergies:  Allergies  Allergen Reactions   Codeine Anaphylaxis   Penicillins Anaphylaxis   Dantrolene Other (See Comments)    Pt had confusion, dizziness and "off" with 50 mg Dantrolene   Lyrica [Pregabalin] Other (See  Comments)    Edema    Family History: Family History  Problem Relation Age of Onset   Diabetes Mother    Diabetes Father    Heart disease Neg Hx     Social History:  reports that she has been smoking cigarettes. She has a 7.50 pack-year smoking history. She has never used smokeless tobacco. She reports current alcohol use. She reports previous drug use.  ROS:                                        Physical Exam: BP 127/77   Pulse 77   Constitutional:  Alert and oriented, No acute distress. HEENT: [place] AT, moist mucus membranes.  Trachea midline, no masses. Cardiovascular: No clubbing, cyanosis, or edema. Respiratory: Normal respiratory effort, no increased work of breathing. GI: Abdomen is soft, nontender, nondistended, no abdominal masses GU: No CVA tenderness.  Sitting comfortably in wheelchair Skin: No rashes, bruises or suspicious lesions. Lymph: No cervical or inguinal adenopathy. Neurologic: Grossly intact, no focal deficits, moving all 4 extremities. Psychiatric: Normal mood and affect.  Laboratory Data: Lab Results  Component Value Date   WBC 7.7 08/14/2020   HGB 13.4 08/14/2020   HCT 40.2 08/14/2020   MCV 101.8 (H) 08/14/2020   PLT 372 08/14/2020    Lab Results  Component Value Date   CREATININE 0.73 08/14/2020    No results found for: PSA  No results found for: TESTOSTERONE  No results found for: HGBA1C  Urinalysis    Component Value Date/Time   COLORURINE YELLOW 08/08/2020 1122   APPEARANCEUR CLEAR 08/08/2020 1122   LABSPEC >1.030 (H) 08/08/2020 1122   PHURINE 5.5 08/08/2020 1122   GLUCOSEU NEGATIVE 08/08/2020 1122   HGBUR TRACE (A) 08/08/2020 1122   BILIRUBINUR NEGATIVE 08/08/2020 1122   KETONESUR NEGATIVE 08/08/2020 1122   PROTEINUR NEGATIVE 08/08/2020 1122   NITRITE POSITIVE (A) 08/08/2020 1122   LEUKOCYTESUR SMALL (A) 08/08/2020 1122    Pertinent Imaging: No urine obtained.  Chart reviewed.  Bladder scan 5  mL.  Assessment & Plan: Is difficult to say but patient certainly has risk factors for neurogenic bladder.  She has had 2 positive cultures.  I gave her 6 or 7 weeks of Myrbetriq 50 mg samples.  I will see her back in about 6 weeks and perform a pelvic examination and cystoscopy.  I will get another urine at that time.  If there is any question on urinary tract infection she will be placed on prophylaxis as a treatment option.  I do not think I will order urodynamics at this stage  1. Dysphagia, pharyngoesophageal phase   2. Neurogenic bowel  - Bladder Scan (Post Void Residual) in office  No follow-ups on file.  Reece Packer, MD  [facility] [address] [place], [place] [address] [phone]

## 2020-11-19 ENCOUNTER — Encounter: Payer: Self-pay | Admitting: Physical Medicine and Rehabilitation

## 2020-11-19 ENCOUNTER — Other Ambulatory Visit: Payer: Self-pay

## 2020-11-19 ENCOUNTER — Encounter: Payer: Medicaid Other | Attending: Registered Nurse | Admitting: Physical Medicine and Rehabilitation

## 2020-11-19 VITALS — BP 101/66 | HR 73 | Temp 98.4°F | Ht 67.5 in | Wt 191.0 lb

## 2020-11-19 DIAGNOSIS — Z993 Dependence on wheelchair: Secondary | ICD-10-CM | POA: Diagnosis present

## 2020-11-19 DIAGNOSIS — M25561 Pain in right knee: Secondary | ICD-10-CM

## 2020-11-19 DIAGNOSIS — G8929 Other chronic pain: Secondary | ICD-10-CM | POA: Diagnosis present

## 2020-11-19 DIAGNOSIS — M25462 Effusion, left knee: Secondary | ICD-10-CM | POA: Insufficient documentation

## 2020-11-19 DIAGNOSIS — R252 Cramp and spasm: Secondary | ICD-10-CM | POA: Diagnosis present

## 2020-11-19 DIAGNOSIS — Z981 Arthrodesis status: Secondary | ICD-10-CM | POA: Diagnosis present

## 2020-11-19 DIAGNOSIS — M25461 Effusion, right knee: Secondary | ICD-10-CM | POA: Diagnosis present

## 2020-11-19 DIAGNOSIS — M25562 Pain in left knee: Secondary | ICD-10-CM | POA: Diagnosis present

## 2020-11-19 DIAGNOSIS — G8252 Quadriplegia, C1-C4 incomplete: Secondary | ICD-10-CM | POA: Diagnosis present

## 2020-11-19 NOTE — Progress Notes (Signed)
Subjective:    Patient ID: Yvette Phillips, female    DOB: [birth year], 60 y.o.   MRN: [id number]  HPI Pt is a 60 yr old female with hx of cervical myelopathy/central cord syndrome secondary to a fall 3/22- with B/L foot drop and AFOs, spasticity, nerve pain, neurogenic bowel and bladder with frequency, and Dysphagia- was on C3 thin diet- Here for f/u on her SCI.   Is still in nursing home R knee >L knee stays heavy- still weak.   Sometimes/frequently, gets sharp pain in R Lateral hip that radiates down the R leg- doesn't go to knee, but R lateral thigh.   Still getting pain meds.   Not getting therapy anymore.  So that's why she can't progress any more than where she's at.   Wearing Foot up brace on LLE and carbon fiber AFO on RLE.   Spasticity "ok" Taking Baclofen 10 mg BID "x 1 month"- had completely stopped it, then put it back on.  Legs also move on their own- pulls up at knee and then cannot extend at knee again.  Occurs at night- after bedtime.   Still having tingling and burning- esp in heels- just burns- on the back of the heels-  Occurs every AM- feels like will cry- 5-6am in the AM, really bothers her.    Meds currently: Zanaflex- 4 mg BID.  Duloxetine 90 mg daily.  Senokot-S 2 tabs QHS Myrbetriq-  50 mg daily.  Trazodone 50 mg QHS prn Put back on gabapentin 200 mg TID-  Oxybutynin 5 mg daily- extended release Lidocaine patches 4%  on knees -has put 1 on neck in past.   Bladder is about the same-  Went to see Urology- given samples- Myrbetriq 50 mg daily- Dr McDiarmid.  Feels like helping a little- but not completely.   Bowels going OK- goes every day- getting on toilet to go.     Has been able to get OOB on own- doing stand pivot transfers- doesn't need anyone to watch her at all.  Still cannot walk. But able to get to bathroom on own- they don't come when  calls via call bell.   At [facility] nursing [facility].  Family could help her if she's goes on.   Waiting on disability- has applied.  Learning to write better with built up sponge handle by [employer].   Skin has completely healed- no skin breakdown.    Pain Inventory Average Pain 8 Pain Right Now 5 My pain is intermittent, burning, stabbing, and tingling  In the last 24 hours, has pain interfered with the following? General activity 8 Relation with others 8 Enjoyment of life 8 What TIME of day is your pain at its worst? evening and night Sleep (in general) Fair  Pain is worse with: walking, sitting, and standing Pain improves with: rest, pacing activities, and medication Relief from Meds: 8  Family History  Problem Relation Age of Onset   Diabetes Mother    Diabetes Father    Heart disease Neg Hx    Social History   Socioeconomic History   Marital status: Single    Spouse name: Not on file   Number of children: Not on file   Years of education: Not on file   Highest education level: Not on file  Occupational History   Not on file  Tobacco Use   Smoking status: Every Day    Packs/day: 0.50    Years: 15.00    Pack years: 7.50  Types: Cigarettes   Smokeless tobacco: Never  Vaping Use   Vaping Use: Never used  Substance and Sexual Activity   Alcohol use: Yes    Comment: occassionally   Drug use: Not Currently   Sexual activity: Not Currently  Other Topics Concern   Not on file  Social History Narrative   Not on file   Social Determinants of Health   Financial Resource Strain: Not on file  Food Insecurity: Not on file  Transportation Needs: Not on file  Physical Activity: Not on file  Stress: Not on file  Social Connections: Not on file   Past Surgical History:  Procedure Laterality Date   ANTERIOR CERVICAL DECOMP/DISCECTOMY FUSION N/A 07/04/2020   Procedure: ANTERIOR CERVICAL DECOMPRESSION/DISCECTOMY FUSION 1 LEVEL;  Surgeon: Venetia Night, MD;  Location: [facility] ORS;  Service: Neurosurgery;  Laterality: N/A;   Past Surgical History:   Procedure Laterality Date   ANTERIOR CERVICAL DECOMP/DISCECTOMY FUSION N/A 07/04/2020   Procedure: ANTERIOR CERVICAL DECOMPRESSION/DISCECTOMY FUSION 1 LEVEL;  Surgeon: Venetia Night, MD;  Location: [facility] ORS;  Service: Neurosurgery;  Laterality: N/A;   No past medical history on file. BP 101/66 (BP Location: Right Arm, Patient Position: Sitting, Cuff Size: Normal)   Pulse 73   Temp 98.4 F (36.9 C) (Oral)   Ht 5' 7.5" (1.715 m)   Wt 191 lb (86.6 kg)   SpO2 95%   BMI 29.47 kg/m   Opioid Risk Score:   Fall Risk Score:  `1  Depression screen PHQ 2/9  Depression screen PHQ 2/9 08/29/2020  Decreased Interest 0  Down, Depressed, Hopeless 0  PHQ - 2 Score 0  Altered sleeping 0  Tired, decreased energy 0  Change in appetite 0  Feeling bad or failure about yourself  0  Trouble concentrating 0  Moving slowly or fidgety/restless 0  Suicidal thoughts 0  PHQ-9 Score 0     Review of Systems  Musculoskeletal:        Knee pain  Neurological:        Tingling  All other systems reviewed and are negative.     Objective:   Physical Exam  Awake, alert, in manual w/c- wearing L foot up brace and R AFO, NAD Severe suprapatellar swelling/effusion- also B/L knees TTP with severe knee effusions 2-3+ edema in Legs- up to knees B/L MS: UE: RUE- 4-/4 in biceps, triceps, WE, grip and finger abd is 4-/5 LUE_ biceps, triceps, 4+/5, WE 4/5, and grip and FA 4+/5 HF 2/5 at best B/L; KE 3-/5 B/L; DF 2-/5 B/L and PF 2/5 B/L at best.   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  Her arms are SO much stronger, almost ASIA D.    Suggest increasing Lidoderm patches to 3 patches daily 12 hrs on;12 hrs off- and put 1 on each leg and 1 on neck.   2. Please continue Baclofen 10 mg 2x/day -breakfast and lunch and add baclofen 20 mg at night, like was on at hospital due to continued spasms/spasticity at night that's waking her  from sleep.   3. Suggest stopping Gabapentin since having severe LE edema- 0 - this was the side effect she had in hospital due to Gabapentin and Lyrica- please stop Gabapentin.  4. Since pt having so much nerve pain- strongly suggest Keppra/Levicetracem- 250 mg 2x/day x 1 week, then 500 mg 2x/day- max dose for nerve pain is 1000 mg BID- suggest instead of Gabapentin- Main side effect is 3-5% of pts gets irritable- Vit B complex , if has symptoms, helps dramatically- . So might need the Vit B complex daily.     5. Don't stop Cymbalta  - for nerve pain  6. Check on disability- let them know I can fill out paperwork- I truly think pt meets criteria for disability for at least 1-2 years. Likely lifetime, since not walking. And likely- will be at w/c level, but if can get her back in therapy, outpt, this might change.   7. Give me 1 month's notice before leaving nursing home- so I can get her a w/c through [facility]- a loaner at first, and then and then order your w/c-  Call me and will order this- pt needs due to Incomplete quadriplegia- unable to  walk and needs to get from point A to point B safely- as a quadriplegic pt pt would also need ultra light weight manual w/c due to  being in w/c full time- and  elevating leg rests due to fluctuating LE edema.- 2-3+ edema currently  Also will need seatbelt to stay in w/c- at risk for falls.  Will write for w/c evaluation once she leaves nursing home.   8. Con't the Zanaflex 4 mg BID for spasticity- most Spinal cord injury pts need 2+ forms of Spasticity meds (in her case, Baclofen and Zanaflex) to control severe tightness/spasms. Baclofen dose changes as above.   Spasticity gets worse for up to 1-2 years after injury- so, keep an eye on it with me, please.   9. Suggest L AFO -carbon fiber AFO since doesn't have antigravity in L dorsiflexion- needs for transfers. Will order when leaves nursing home. From [facility], if possible.   10. Suggest Xrays of knees B/L and also suggest referral to Orthopedics- to see if can get knee injections, if  the nursing home cannot/doesn't do them- a steroid injection to calm down pain and swelling. I placed a referral.   11. Also suggest Tramadol 50 mg q6 hours prn for severe pain- also helps with Nerve pain, which we haven't gotten good control of so far.    12.  F/U in 2 months- double appointment- at least for first year. Needs to get PCP after nursing home.    I spent a total of 48 minutes on appointment- going over w/c, leaving nursing home, spasticity management and nerve pain.  Doesn't have PCP outside the nursing home. Needs to get one.- discussed that.

## 2020-11-19 NOTE — Patient Instructions (Signed)
Exam:  Awake, alert, in manual w/c- wearing L foot up brace and R AFO, NAD Severe suprapatellar swelling/effusion- also B/L knees TTP with severe knee effusions 2-3+ edema in Legs- up to knees B/L MS: UE: RUE- 4-/4 in biceps, triceps, WE, grip and finger abd is 4-/5 LUE_ biceps, triceps, 4+/5, WE 4/5, and grip and FA 4+/5 HF 2/5 at best B/L; KE 3-/5 B/L; DF 2-/5 B/L and PF 2/5 B/L at best.   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  Her arms are SO much stronger, almost ASIA D.   Suggest increasing Lidoderm patches to 3 patches daily 12 hrs on;12 hrs off- and put 1 on each leg and 1 on neck.   2. Please continue Baclofen 10 mg 2x/day -breakfast and lunch and add baclofen 20 mg at night, like was on at hospital due to continued spasms/spasticity at night that's waking her from sleep.   3. Suggest stopping Gabapentin since having severe LE edema- 0 - this was the side effect she had in hospital due to Gabapentin and Lyrica- please stop Gabapentin.  4. Since pt having so much nerve pain- strongly suggest Keppra/Levicetracem- 250 mg 2x/day x 1 week, then 500 mg 2x/day- max dose for nerve pain is 1000 mg BID- suggest instead of Gabapentin- Main side effect is 3-5% of pts gets irritable- Vit B complex , if has symptoms, helps dramatically- . So might need the Vit B complex daily.     5. Don't stop Cymbalta  - for nerve pain  6. Check on disability- let them know I can fill out paperwork- I truly think pt meets criteria for disability for at least 1-2 years. Likely lifetime, since not walking. And likely- will be at w/c level,  but if can get her back in therapy, outpt, this might change.   7. Give me 1 month's notice before leaving nursing home- so I can get her a w/c through [facility]- a loaner at first, and then and then order your w/c-  Call me and will order this- pt needs due to Incomplete quadriplegia- unable to  walk and needs to get from point A to point B safely- as a quadriplegic pt pt would also need ultra light weight manual w/c due to  being in w/c full time- and  elevating leg rests due to fluctuating LE edema.- 2-3+ edema currently  Also will need seatbelt to stay in w/c- at risk for falls.  Will write for w/c evaluation once she leaves nursing home.   8. Con't the Zanaflex 4 mg BID for spasticity- most Spinal cord injury pts need 2+ forms of Spasticity meds (in her case, Baclofen and Zanaflex) to control severe tightness/spasms. Baclofen dose changes as above.  Spasticity gets worse for up to 1-2 years after injury- so, keep an eye on it with me, please.   9. Suggest L AFO -carbon fiber AFO since doesn't have antigravity in L dorsiflexion- needs for transfers. Will order when leaves nursing home. From [facility], if possible.   10. Suggest  Xrays of knees B/L and also suggest referral to Orthopedics- to see if can get knee injections, if  the nursing home cannot/doesn't do them- a steroid injection to calm down pain and swelling. I placed a referral.   11. Also suggest Tramadol 50 mg q6 hours prn for severe pain- also helps with Nerve pain, which we haven't gotten good control of so far.    12.  F/U in 2 months- double appointment- at least for first year. Needs to get PCP after nursing home.

## 2020-11-28 ENCOUNTER — Ambulatory Visit (INDEPENDENT_AMBULATORY_CARE_PROVIDER_SITE_OTHER): Payer: Medicaid Other | Admitting: Orthopaedic Surgery

## 2020-11-28 ENCOUNTER — Encounter: Payer: Self-pay | Admitting: Orthopaedic Surgery

## 2020-11-28 ENCOUNTER — Ambulatory Visit: Payer: Self-pay

## 2020-11-28 ENCOUNTER — Ambulatory Visit (INDEPENDENT_AMBULATORY_CARE_PROVIDER_SITE_OTHER): Payer: Medicaid Other

## 2020-11-28 ENCOUNTER — Other Ambulatory Visit: Payer: Self-pay

## 2020-11-28 VITALS — Ht 67.5 in | Wt 191.0 lb

## 2020-11-28 DIAGNOSIS — M7989 Other specified soft tissue disorders: Secondary | ICD-10-CM

## 2020-11-28 DIAGNOSIS — M25562 Pain in left knee: Secondary | ICD-10-CM | POA: Diagnosis not present

## 2020-11-28 DIAGNOSIS — G8929 Other chronic pain: Secondary | ICD-10-CM

## 2020-11-28 DIAGNOSIS — M25561 Pain in right knee: Secondary | ICD-10-CM

## 2020-11-28 DIAGNOSIS — M79605 Pain in left leg: Secondary | ICD-10-CM

## 2020-11-28 NOTE — Progress Notes (Addendum)
Office Visit Note   Patient: Yvette Phillips           Date of Birth: 04-22-1961           MRN: [id number] Visit Date: 11/28/2020              Requested by: Yvette Rouge, MD [phone] [address] [place],  [place] [address] PCP: Patient, No Pcp Per (Inactive)   Assessment & Plan: Visit Diagnoses:  1. Chronic pain of left knee   2. Chronic pain of right knee     Plan: She will continue to work with physical therapy for gait balance strengthening upper and lower extremities.  Follow-up with [place] as needed.  Discussed with her that that she can have cortisone injections in her knees no more often than every 3 months.  Due to patient's bilateral lower extremity swelling which is likely due to being dependent but given the calf tenderness and the pitting edema recommend Doppler to rule out DVT of either leg.  Questions were encouraged and answered by Dr. Magnus Phillips myself.  She is using Lidoderm patches on both knees and can continue to use these.  Follow-Up Instructions: Return if symptoms worsen or fail to improve.   Orders:  Orders Placed This Encounter  Procedures   XR Knee 1-2 Views Right   XR Knee 1-2 Views Left   No orders of the defined types were placed in this encounter.     Procedures: No procedures performed   Clinical Data: No additional findings.   Subjective: Chief Complaint  Patient presents with   Left Knee - Pain   Right Knee - Pain    HPI Yvette Phillips is a pleasant 60 year old female were seen for the first time for bilateral knee pain.  She has had no acute injury to either knee.  However unfortunately she has a history of fall around Christmas time of 2021 in which she sustained cord compression at C4 resulting in the central cord syndrome.  She underwent cervical surgery on C4-C5.  She is having difficult rehab since that time but has made great strides with PT.  Patient denies any previous pain in either knee.  She is referred here today for evaluation  of both knees.  She is nondiabetic.  Reports being on Lovenox injections daily until about a month ago.  No chest pain or shortness of breath. Review of Systems Denies any fevers, chills, recent infections, or vaccines in the last 2 weeks.  Objective: Vital Signs: Ht 5' 7.5" (1.715 m)   Wt 191 lb (86.6 kg)   BMI 29.47 kg/m   Physical Exam Constitutional:      Appearance: She is not ill-appearing or diaphoretic.  Pulmonary:     Effort: Pulmonary effort is normal.  Neurological:     Mental Status: She is alert.  Psychiatric:        Mood and Affect: Mood normal.    Ortho Exam Patient is able to transfer from wheelchair to the exam table with minimal assistance.  She is able do straight leg raise bilaterally.  Has full extension of both knees actively and flexion to approximately 100 degrees bilaterally.  No gross instability of either knee.  Slight effusion right greater than left knee.  No abnormal warmth erythema of either knee.  Tenderness medial lateral joint line bilateral knees.  Calfs minimal tenderness.  Pitting edema bilateral lower extremities. Specialty Comments:  No specialty comments available.  Imaging: XR Knee 1-2 Views Left  Result Date: 11/28/2020 Left knee 2 views: Knee is well located.  No acute fractures.  Moderate medial compartment and patellofemoral changes.  XR Knee 1-2 Views Right  Result Date: 11/28/2020 Right knee AP lateral views: No acute fractures.  Knee is well located.  Near bone-on-bone medial compartment.  Patellofemoral compartment with moderate changes.  Slight effusion.    PMFS History: Patient Active Problem List   Diagnosis Date Noted   Spasticity 11/19/2020   Quadriplegia, C1-C4, incomplete (HCC) 11/19/2020   Effusion of both knee joints 11/19/2020   Chronic pain of both knees 11/19/2020   Wheelchair dependence 11/19/2020   Constipation    Neurogenic bladder 07/16/2020   Neurogenic bowel 07/16/2020   Central cord syndrome at C4 level  of cervical spinal cord, subsequent encounter (HCC) 07/11/2020   HNP (herniated nucleus pulposus) with myelopathy, cervical    S/P cervical spinal fusion    Acute blood loss anemia    Macrocytosis    Dysphagia, pharyngoesophageal phase    Pressure injury of skin 07/05/2020   Nicotine dependence, cigarettes, uncomplicated 07/04/2020   Hypokalemia 07/04/2020   Hypomagnesemia 07/04/2020   Polysubstance abuse (HCC) 07/03/2020   Central cord syndrome (HCC) 05/21/2020   Alcohol abuse 05/21/2020   No past medical history on file.  Family History  Problem Relation Age of Onset   Diabetes Mother    Diabetes Father    Heart disease Neg Hx     Past Surgical History:  Procedure Laterality Date   ANTERIOR CERVICAL DECOMP/DISCECTOMY FUSION N/A 07/04/2020   Procedure: ANTERIOR CERVICAL DECOMPRESSION/DISCECTOMY FUSION 1 LEVEL;  Surgeon: Venetia Night, MD;  Location: [facility] ORS;  Service: Neurosurgery;  Laterality: N/A;   Social History   Occupational History   Not on file  Tobacco Use   Smoking status: Every Day    Packs/day: 0.50    Years: 15.00    Pack years: 7.50    Types: Cigarettes   Smokeless tobacco: Never  Vaping Use   Vaping Use: Never used  Substance and Sexual Activity   Alcohol use: Yes    Comment: occassionally   Drug use: Not Currently   Sexual activity: Not Currently

## 2020-12-04 ENCOUNTER — Other Ambulatory Visit: Payer: Self-pay

## 2020-12-04 ENCOUNTER — Ambulatory Visit (INDEPENDENT_AMBULATORY_CARE_PROVIDER_SITE_OTHER): Payer: Medicaid Other

## 2020-12-04 DIAGNOSIS — M79605 Pain in left leg: Secondary | ICD-10-CM | POA: Diagnosis not present

## 2020-12-04 DIAGNOSIS — M7989 Other specified soft tissue disorders: Secondary | ICD-10-CM

## 2020-12-10 DIAGNOSIS — Z87898 Personal history of other specified conditions: Secondary | ICD-10-CM | POA: Insufficient documentation

## 2020-12-10 DIAGNOSIS — Z87891 Personal history of nicotine dependence: Secondary | ICD-10-CM | POA: Insufficient documentation

## 2020-12-17 ENCOUNTER — Encounter: Payer: Self-pay | Admitting: Urology

## 2020-12-17 ENCOUNTER — Ambulatory Visit (INDEPENDENT_AMBULATORY_CARE_PROVIDER_SITE_OTHER): Payer: Medicaid Other | Admitting: Urology

## 2020-12-17 ENCOUNTER — Other Ambulatory Visit: Payer: Self-pay

## 2020-12-17 DIAGNOSIS — N3946 Mixed incontinence: Secondary | ICD-10-CM

## 2020-12-17 LAB — URINALYSIS, COMPLETE
Bilirubin, UA: NEGATIVE
Glucose, UA: NEGATIVE
Nitrite, UA: POSITIVE — AB
Protein,UA: NEGATIVE
Specific Gravity, UA: 1.02 (ref 1.005–1.030)
Urobilinogen, Ur: 0.2 mg/dL (ref 0.2–1.0)
pH, UA: 6.5 (ref 5.0–7.5)

## 2020-12-17 LAB — MICROSCOPIC EXAMINATION

## 2020-12-17 MED ORDER — TRIMETHOPRIM 100 MG PO TABS: 100.0000 mg | ORAL_TABLET | Freq: Every day | ORAL | 11 refills | Status: DC

## 2020-12-17 NOTE — Progress Notes (Signed)
12/17/2020 2:18 PM   Yvette Phillips 1960-08-15 [id number]  Referring provider: No referring provider defined for this encounter.  No chief complaint on file.   HPI: Was consulted to assess the patient's urinary incontinence.  Some of the details were difficult.  It appears she had a cervical fusion February 2022.  It is thought that she has a neurogenic bladder and bowel.  She is on darifenacin based upon medical record.  It looks like she had a positive urine culture twice in March 2022  Her primary complaint is bedwetting.  It is high-volume.  She can toilet quite well and transfer from a wheelchair during the day and generally is not leaking.    Its difficult to say but patient certainly has risk factors for neurogenic bladder.  She has had 2 positive cultures.  I gave her 6 or 7 weeks of Myrbetriq 50 mg samples.  I will see her back in about 6 weeks and perform a pelvic examination and cystoscopy.  I will get another urine at that time.  If there is any question on urinary tract infection she will be placed on prophylaxis as a treatment option.  I do not think I will order urodynamics at this stage  Today Patient once again cannot give a good history.  She seemed a bit sleepy.  Frequency stable.  She says Myrbetriq is helping but was nonspecific Cystoscopy: Patient underwent flexible cystoscopy using sterile technique.  Bladder mucosa and trigone were normal.  No stitch or foreign body or carcinoma.  Urine was a bit cloudy with white flecks.  Cath urine was sent for culture  PMH: No past medical history on file.  Surgical History: Past Surgical History:  Procedure Laterality Date   ANTERIOR CERVICAL DECOMP/DISCECTOMY FUSION N/A 07/04/2020   Procedure: ANTERIOR CERVICAL DECOMPRESSION/DISCECTOMY FUSION 1 LEVEL;  Surgeon: Venetia Night, MD;  Location: [facility] ORS;  Service: Neurosurgery;  Laterality: N/A;    Home Medications:  Allergies as of 12/17/2020       Reactions   Codeine  Anaphylaxis   Penicillins Anaphylaxis   Dantrolene Other (See Comments)   Pt had confusion, dizziness and "off" with 50 mg Dantrolene   Lyrica [pregabalin] Other (See Comments)   Edema        Medication List        Accurate as of December 17, 2020  2:18 PM. If you have any questions, ask your nurse or doctor.          acetaminophen 325 MG tablet Commonly known as: TYLENOL Take 1-2 tablets (325-650 mg total) by mouth every 4 (four) hours as needed for mild pain.   baclofen 20 MG tablet Commonly known as: LIORESAL Take 1 tablet (20 mg total) by mouth at bedtime.   bisacodyl 10 MG suppository Commonly known as: DULCOLAX Place 1 suppository (10 mg total) rectally daily as needed for moderate constipation (no bowel program anymore).   cyanocobalamin 2000 MCG tablet Take 1 tablet (2,000 mcg total) by mouth daily.   darifenacin 7.5 MG 24 hr tablet Commonly known as: ENABLEX Take 1 tablet (7.5 mg total) by mouth daily.   DULoxetine 30 MG capsule Commonly known as: CYMBALTA Take 3 capsules (90 mg total) by mouth at bedtime.   enoxaparin 40 MG/0.4ML injection Commonly known as: LOVENOX Inject 0.4 mLs (40 mg total) into the skin daily.   famotidine 20 MG tablet Commonly known as: PEPCID Take 1 tablet (20 mg total) by mouth at bedtime.   folic acid 1  MG tablet Commonly known as: FOLVITE Take 1 tablet (1 mg total) by mouth daily.   gabapentin 100 MG capsule Commonly known as: NEURONTIN Take 100 mg by mouth 3 (three) times daily. 2 caps TID   Gerhardt's butt cream Crea Apply 1 application topically 3 (three) times daily.   hydrocerin Crea Apply 1 application topically 2 (two) times daily.   lidocaine 5 % Commonly known as: LIDODERM Place 1 patch onto the skin daily. Remove & Discard patch within 12 hours or as directed by MD   mirabegron ER 50 MG Tb24 tablet Commonly known as: MYRBETRIQ Take 1 tablet (50 mg total) by mouth daily.   multivitamin with minerals  Tabs tablet Take 1 tablet by mouth daily.   nicotine 7 mg/24hr patch Commonly known as: NICODERM CQ - dosed in mg/24 hr Place 1 patch (7 mg total) onto the skin daily.   oxybutynin 5 MG tablet Commonly known as: DITROPAN Take 5 mg by mouth 3 (three) times daily.   polyethylene glycol 17 g packet Commonly known as: MIRALAX / GLYCOLAX Take 17 g by mouth 2 (two) times daily.   senna-docusate 8.6-50 MG tablet Commonly known as: Senokot-S Take 2 tablets by mouth at bedtime.   thiamine 100 MG tablet Take 1 tablet (100 mg total) by mouth daily.   tiZANidine 4 MG tablet Commonly known as: ZANAFLEX Take 1 tablet (4 mg total) by mouth 2 (two) times daily.   traZODone 50 MG tablet Commonly known as: DESYREL Take 0.5-1 tablets (25-50 mg total) by mouth at bedtime as needed for sleep.   white petrolatum Oint Commonly known as: VASELINE Apply 1 application topically 2 (two) times daily.        Allergies:  Allergies  Allergen Reactions   Codeine Anaphylaxis   Penicillins Anaphylaxis   Dantrolene Other (See Comments)    Pt had confusion, dizziness and "off" with 50 mg Dantrolene   Lyrica [Pregabalin] Other (See Comments)    Edema    Family History: Family History  Problem Relation Age of Onset   Diabetes Mother    Diabetes Father    Heart disease Neg Hx     Social History:  reports that she has been smoking cigarettes. She has a 7.50 pack-year smoking history. She has never used smokeless tobacco. She reports current alcohol use. She reports previous drug use.  ROS:                                        Physical Exam: There were no vitals taken for this visit.  Constitutional:  Alert and oriented, No acute distress. HEENT: [place] AT, moist mucus membranes.  Trachea midline, no masses. Cardiovascular: No clubbing, cyanosis, or edema.  Laboratory Data: Lab Results  Component Value Date   WBC 7.7 08/14/2020   HGB 13.4 08/14/2020   HCT 40.2  08/14/2020   MCV 101.8 (H) 08/14/2020   PLT 372 08/14/2020    Lab Results  Component Value Date   CREATININE 0.73 08/14/2020    No results found for: PSA  No results found for: TESTOSTERONE  No results found for: HGBA1C  Urinalysis    Component Value Date/Time   COLORURINE YELLOW 08/08/2020 1122   APPEARANCEUR CLEAR 08/08/2020 1122   LABSPEC >1.030 (H) 08/08/2020 1122   PHURINE 5.5 08/08/2020 1122   GLUCOSEU NEGATIVE 08/08/2020 1122   HGBUR TRACE (A) 08/08/2020 1122  BILIRUBINUR NEGATIVE 08/08/2020 1122   KETONESUR NEGATIVE 08/08/2020 1122   PROTEINUR NEGATIVE 08/08/2020 1122   NITRITE POSITIVE (A) 08/08/2020 1122   LEUKOCYTESUR SMALL (A) 08/08/2020 1122    Pertinent Imaging:   Assessment & Plan: I think the patient stay on Myrbetriq.  I will put the patient on trimethoprim 100 mg 3x11 and reassess in 8 weeks.  Call if culture positive.  1. Mixed incontinence  - Urinalysis, Complete   No follow-ups on file.  Martina Sinner, MD  [facility] [address] [place], [place] [address] [phone]

## 2020-12-17 NOTE — Progress Notes (Signed)
In and Out Catheterization  Patient is present today for a I & O catheterization due to cystoscopy. Patient was cleaned and prepped in a sterile fashion with betadine . A 16FR cath was inserted no complications were noted , 130 ml of urine return was noted, urine was dark yellow in color. A clean urine sample was collected for Urinalysis and urine culture. Bladder was drained  And catheter was removed with out difficulty.    Preformed by: Gerarda Gunther, RMA & Milas Kocher, CMA  Follow up/ Additional notes: 8 wks. Will call with urine culture results.

## 2020-12-19 LAB — CULTURE, URINE COMPREHENSIVE

## 2020-12-20 ENCOUNTER — Telehealth: Payer: Self-pay

## 2020-12-20 MED ORDER — NITROFURANTOIN MONOHYD MACRO 100 MG PO CAPS: 100.0000 mg | ORAL_CAPSULE | Freq: Two times a day (BID) | ORAL | 0 refills | Status: AC

## 2020-12-20 MED ORDER — NITROFURANTOIN MONOHYD MACRO 100 MG PO CAPS: 100.0000 mg | ORAL_CAPSULE | Freq: Every day | ORAL | 11 refills | Status: AC

## 2020-12-20 NOTE — Telephone Encounter (Signed)
-----   Message from Levada Schilling, [place] sent at 12/20/2020  2:07 PM EDT -----  ----- Message ----- From: Alfredo Martinez, MD Sent: 12/20/2020   1:22 PM EDT To: Levada Schilling, CMA  Macrodantin 100 mg twice a day for 7 days; then go back on daily trimethoprim       ----- Message ----- From: Levada Schilling, CMA Sent: 12/19/2020   8:24 AM EDT To: Alfredo Martinez, MD   ----- Message ----- From: Interface, [facility] Lab Results In Sent: 12/17/2020   4:36 PM EDT To: Jennette Kettle Clinical

## 2020-12-20 NOTE — Telephone Encounter (Signed)
As per Dr. Sherron Monday pt will take Macrodantin 100 mg daily 30x11 after finishing macrobid 100mg  bid x 7 days.   Sent medication to pharmacy. SW to PT nurse at [facility]. She verbalized understanding.

## 2020-12-20 NOTE — Telephone Encounter (Signed)
Sw pt nurse at [facility] [phone]. Macrobid sent to [facility]. Nurse aware.   Nurse at [facility] pharmacy states trimethoprim is not covered by insurance and would like [place] to call in a different daily antibiotic. Please advise.

## 2020-12-28 ENCOUNTER — Ambulatory Visit (INDEPENDENT_AMBULATORY_CARE_PROVIDER_SITE_OTHER): Payer: Medicaid Other | Admitting: Podiatry

## 2020-12-28 ENCOUNTER — Encounter: Payer: Self-pay | Admitting: Podiatry

## 2020-12-28 ENCOUNTER — Other Ambulatory Visit: Payer: Self-pay

## 2020-12-28 VITALS — BP 142/76 | HR 77 | Temp 98.8°F

## 2020-12-28 DIAGNOSIS — M79674 Pain in right toe(s): Secondary | ICD-10-CM

## 2020-12-28 DIAGNOSIS — M79675 Pain in left toe(s): Secondary | ICD-10-CM

## 2020-12-28 DIAGNOSIS — B351 Tinea unguium: Secondary | ICD-10-CM | POA: Diagnosis not present

## 2020-12-28 DIAGNOSIS — R600 Localized edema: Secondary | ICD-10-CM | POA: Diagnosis not present

## 2020-12-28 DIAGNOSIS — Q828 Other specified congenital malformations of skin: Secondary | ICD-10-CM

## 2020-12-28 DIAGNOSIS — I739 Peripheral vascular disease, unspecified: Secondary | ICD-10-CM | POA: Diagnosis not present

## 2020-12-30 ENCOUNTER — Encounter: Payer: Self-pay | Admitting: Podiatry

## 2020-12-30 NOTE — Progress Notes (Signed)
Subjective: Yvette Phillips presents today referred by [facility] for complaint of painful thick toenails that are difficult to trim. Pain interferes with ambulation. Aggravating factors include wearing enclosed shoe gear. Pain is relieved with periodic professional debridement.  Patient is a resident of [facility].  History reviewed. No pertinent past medical history.   Patient Active Problem List   Diagnosis Date Noted   History of alcohol use disorder 12/10/2020   History of tobacco use disorder 12/10/2020   Spasticity 11/19/2020   Quadriplegia, C1-C4, incomplete (HCC) 11/19/2020   Effusion of both knee joints 11/19/2020   Chronic pain of both knees 11/19/2020   Wheelchair dependence 11/19/2020   Constipation    Neurogenic bladder 07/16/2020   Neurogenic bowel 07/16/2020   Central cord syndrome at C4 level of cervical spinal cord, subsequent encounter (HCC) 07/11/2020   HNP (herniated nucleus pulposus) with myelopathy, cervical    S/P cervical spinal fusion    Acute blood loss anemia    Macrocytosis    Dysphagia, pharyngoesophageal phase    Pressure injury of skin 07/05/2020   Nicotine dependence, cigarettes, uncomplicated 07/04/2020   Hypokalemia 07/04/2020   Hypomagnesemia 07/04/2020   Polysubstance abuse (HCC) 07/03/2020   Central cord syndrome (HCC) 05/21/2020   Alcohol abuse 05/21/2020     Past Surgical History:  Procedure Laterality Date   ANTERIOR CERVICAL DECOMP/DISCECTOMY FUSION N/A 07/04/2020   Procedure: ANTERIOR CERVICAL DECOMPRESSION/DISCECTOMY FUSION 1 LEVEL;  Surgeon: Venetia Night, MD;  Location: [facility] ORS;  Service: Neurosurgery;  Laterality: N/A;     Current Outpatient Medications on File Prior to Visit  Medication Sig Dispense Refill   famotidine (PEPCID) 20 MG tablet Take 1 tablet (20 mg total) by mouth at bedtime.     levETIRAcetam (KEPPRA) 500 MG tablet Take by mouth.     lidocaine (LIDODERM) 5 % Place 1 patch onto the skin  daily. Remove & Discard patch within 12 hours or as directed by MD     oxybutynin (DITROPAN) 5 MG tablet Take 5 mg by mouth 3 (three) times daily.     senna-docusate (SENOKOT-S) 8.6-50 MG tablet Take 2 tablets by mouth at bedtime.     tiZANidine (ZANAFLEX) 4 MG tablet Take 1 tablet (4 mg total) by mouth 2 (two) times daily. 30 tablet 0   traZODone (DESYREL) 50 MG tablet Take 0.5-1 tablets (25-50 mg total) by mouth at bedtime as needed for sleep.     vitamin B-12 2000 MCG tablet Take 1 tablet (2,000 mcg total) by mouth daily.     acetaminophen (TYLENOL) 325 MG tablet Take 1-2 tablets (325-650 mg total) by mouth every 4 (four) hours as needed for mild pain.     baclofen (LIORESAL) 20 MG tablet Take 1 tablet (20 mg total) by mouth at bedtime. (Patient not taking: Reported on 12/28/2020) 30 each 0   bisacodyl (DULCOLAX) 10 MG suppository Place 1 suppository (10 mg total) rectally daily as needed for moderate constipation (no bowel program anymore). 12 suppository 0   darifenacin (ENABLEX) 7.5 MG 24 hr tablet Take 1 tablet (7.5 mg total) by mouth daily.     DULoxetine (CYMBALTA) 30 MG capsule Take 3 capsules (90 mg total) by mouth at bedtime.  3   enoxaparin (LOVENOX) 40 MG/0.4ML injection Inject 0.4 mLs (40 mg total) into the skin daily. 0 mL    folic acid (FOLVITE) 1 MG tablet Take 1 tablet (1 mg total) by mouth daily.     gabapentin (NEURONTIN) 100 MG capsule Take  100 mg by mouth 3 (three) times daily. 2 caps TID     hydrocerin (EUCERIN) CREA Apply 1 application topically 2 (two) times daily.  0   mirabegron ER (MYRBETRIQ) 50 MG TB24 tablet Take 1 tablet (50 mg total) by mouth daily. 30 tablet 11   Multiple Vitamin (MULTIVITAMIN WITH MINERALS) TABS tablet Take 1 tablet by mouth daily.     nicotine (NICODERM CQ - DOSED IN MG/24 HR) 7 mg/24hr patch Place 1 patch (7 mg total) onto the skin daily. 28 patch 0   nitrofurantoin, macrocrystal-monohydrate, (MACROBID) 100 MG capsule Take 1 capsule (100 mg total)  by mouth daily. 30 capsule 11   Nystatin (GERHARDT'S BUTT CREAM) CREA Apply 1 application topically 3 (three) times daily.     polyethylene glycol (MIRALAX / GLYCOLAX) 17 g packet Take 17 g by mouth 2 (two) times daily. 14 each 0   thiamine 100 MG tablet Take 1 tablet (100 mg total) by mouth daily.     white petrolatum (VASELINE) OINT Apply 1 application topically 2 (two) times daily.  0   No current facility-administered medications on file prior to visit.     Allergies  Allergen Reactions   Codeine Anaphylaxis   Penicillins Anaphylaxis   Dantrolene Other (See Comments)    Pt had confusion, dizziness and "off" with 50 mg Dantrolene   Lyrica [Pregabalin] Other (See Comments)    Edema     Social History   Occupational History   Not on file  Tobacco Use   Smoking status: Every Day    Packs/day: 0.50    Years: 15.00    Pack years: 7.50    Types: Cigarettes   Smokeless tobacco: Never  Vaping Use   Vaping Use: Never used  Substance and Sexual Activity   Alcohol use: Yes    Comment: occassionally   Drug use: Not Currently   Sexual activity: Not Currently     Family History  Problem Relation Age of Onset   Diabetes Mother    Diabetes Father    Heart disease Neg Hx      Immunization History  Administered Date(s) Administered   Influenza,inj,Quad PF,6+ Mos 07/12/2020   Moderna SARS-COV2 Booster Vaccination 04/03/2020   Moderna Sars-Covid-2 Vaccination 07/01/2019, 07/29/2019   Pneumococcal Polysaccharide-23 07/18/2020   Tdap 12/30/2017     Objective: Yvette Phillips is a pleasant 60 y.o. female morbidly obese in NAD. AAO x 3.  Vitals:   12/28/20 1604  BP: (!) 142/76  Pulse: 77  Temp: 98.8 F (37.1 C)  SpO2: 97%    Vascular Examination:  Capillary refill time to digits immediate b/l. Palpable DP pulse(s) b/l lower extremities Nonpalpable PT pulse(s) b/l lower extremities. Pedal hair sparse. Lower extremity skin temperature gradient within normal limits. Trace  edema noted b/l feet. Nonpitting edema noted bilateral ankles.  Dermatological Examination: Pedal skin with normal turgor, texture and tone b/l lower extremities. Toenails 1-5 b/l elongated, discolored, dystrophic, thickened, crumbly with subungual debris and tenderness to dorsal palpation. Porokeratotic lesion(s) submet head 3 left foot, submet head 3 right foot, and submet head 5 right foot. No erythema, no edema, no drainage, no fluctuance.  Musculoskeletal: Dropfoot b/l lower extremities. Wearing AFO on right lower extremity. Utilizes wheelchair for mobility assistance.  Neurological: Protective sensation intact 5/5 intact bilaterally with 10g monofilament b/l. Vibratory sensation decreased b/l.  Assessment: 1. Pain due to onychomycosis of toenails of both feet   2. Porokeratosis   3. Localized edema   4. PAD (peripheral  artery disease) (HCC)     Plan: -Examined patient. -Medicaid does not cover callus debridement. Patient refuses services of paring of porokeratoses  today. Copy has been placed in patient chart. -Patient to continue soft, supportive shoe gear daily. -Toenails 1-5 b/l were debrided in length and girth with sterile nail nippers and dremel without iatrogenic bleeding.  -Patient to report any pedal injuries to medical professional immediately. -Patient/POA to call should there be question/concern in the interim.  Return in about 3 months (around 03/30/2021).  Freddie Breech, DPM

## 2021-01-30 ENCOUNTER — Encounter: Payer: Self-pay | Admitting: Physical Medicine and Rehabilitation

## 2021-01-30 ENCOUNTER — Encounter
Payer: Medicaid Other | Attending: Physical Medicine and Rehabilitation | Admitting: Physical Medicine and Rehabilitation

## 2021-01-30 ENCOUNTER — Other Ambulatory Visit: Payer: Self-pay

## 2021-01-30 VITALS — BP 108/71 | HR 82 | Temp 98.5°F | Ht 67.5 in | Wt 227.6 lb

## 2021-01-30 DIAGNOSIS — G8252 Quadriplegia, C1-C4 incomplete: Secondary | ICD-10-CM | POA: Diagnosis present

## 2021-01-30 DIAGNOSIS — Z981 Arthrodesis status: Secondary | ICD-10-CM

## 2021-01-30 DIAGNOSIS — M792 Neuralgia and neuritis, unspecified: Secondary | ICD-10-CM | POA: Diagnosis present

## 2021-01-30 DIAGNOSIS — Z993 Dependence on wheelchair: Secondary | ICD-10-CM

## 2021-01-30 DIAGNOSIS — R252 Cramp and spasm: Secondary | ICD-10-CM

## 2021-01-30 NOTE — Patient Instructions (Signed)
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 Now needs to find a place to stay. Goal to go back to [place]- they are looking for w/c accessible apartment.  You need to let me know me know when leaving nursing home- give me a heads up by 1 month or so.   3. the patient requires an ultra light weight w/c- foldable manual w/c- with spasticity and  variable LE edema -so needs foot plates  specific for spasticity and edema- not closed in around legs. Also needs anti-tippers- will also needs pressure relieving cushion- no hx of pressure ulcers, but is an incomplete quadriplegic ASIA D- NOT walking. Might benefit from power assist wheels, if possible, due to UE /shoulder pain, neuropathy/decreased sensation that makes it difficult for her to push w/c longer distances.  Have left message for Barbara Cower at [facility] see if she can get loaner when leaves nursing home and be fitted for new w/c. Needs Rx, but need to see if was fitted in hospital for w/c/eval first.   4. Con't Baclofen and Zanaflex dosing as wlel as Keppra- all these meds are working well- don't want to change dosing.   5. Needs to call disability- pt still w/c dependent and cannot walk due to severe sensory loss accompanying her SCI/quadriplegia.   6. Doesn't need L AFO anymore!  7. Con't tramadol 50 mg q6 hours prn- usually takes 1-2x/day so doing well.   8. Once leaves nursing home, can take over Tramadol, but would need opiate contract and UDS  9. F/U in 43months- double appt- SCI  10. We discussed how great she's doing  sober and clean and needs to stay that way so can prescribe tramadol once out of nursing home.

## 2021-01-30 NOTE — Progress Notes (Signed)
Subjective:    Patient ID: Yvette Phillips, female    DOB: 03/16/1961, 60 y.o.   MRN: [id number]  HPI   Pt is a 60 yr old female with hx of cervical myelopathy/central cord syndrome  (C4 ASIC C) secondary to a fall 3/22- with B/L foot drop and AFOs, spasticity, nerve pain, neurogenic bowel and bladder with frequency, and Dysphagia- was on C3 thin diet- Here for f/u on her SCI.  Her arms are SO much stronger, almost ASIA D.  Here for f/u on SCI.  Still at nursing facility-  Stopped gabapentin-swelling looks a lot better off gabapentin.   Keppra has helped a whole lot for nerve pain. Everything in terms of pain is going well.   No irritability on Keppra-   Spasms/spasticity is doing OK- not perfect but better than it was- a lot better.  Not too sleepy. Doing Baclofen 03/05/19 mg and Zanaflex 4mg  BID. Is stiff ~ 1x/week- but not "stiff" on other 6 days/week.   Getting up to bathroom on her own- wheels w/c into bathroom and transfers to toilet.  Uses dressing stick usually, but sometimes can go without to get dressed.   Getting to bathroom on time- no incontinence lately.   Neck doesn't bother her as much lately with tramadol, so not using the lidoderm patches- and 1x/week uses for legs- if don't hurt, doesn't use patches.    Doesn't have AFO on L  CAPS got her approved- now has to get a home!  Emailed SW at nursing facility- didn't tell pt-  Brother and sister will bring her paperwork today and they approved it. Doesn't know how many hours will get/week.   Hasn't heard about disability is pending?  Pain Inventory Average Pain 0 Pain Right Now 0 My pain is  no pain   LOCATION OF PAIN  none  BOWEL Number of stools per week: 7 plus Oral laxative use Yes  Type of laxative Miralax Enema or suppository use No  History of colostomy No  Incontinent No   BLADDER Normal    Mobility walk without assistance ability to climb steps?  yes do you drive?  no use a  wheelchair transfers alone Do you have any goals in this area?  yes  Function disabled: date disabled in progress I need assistance with the following:  meal prep, household duties, shopping, and in nursing facility Do you have any goals in this area?  yes  Neuro/Psych weakness tingling trouble walking spasms  Prior Studies Any changes since last visit?  no  Physicians involved in your care Any changes since last visit?  no   Family History  Problem Relation Age of Onset   Diabetes Mother    Diabetes Father    Heart disease Neg Hx    Social History   Socioeconomic History   Marital status: Single    Spouse name: Not on file   Number of children: Not on file   Years of education: Not on file   Highest education level: Not on file  Occupational History   Not on file  Tobacco Use   Smoking status: Every Day    Packs/day: 0.50    Years: 15.00    Pack years: 7.50    Types: Cigarettes   Smokeless tobacco: Never  Vaping Use   Vaping Use: Never used  Substance and Sexual Activity   Alcohol use: Yes    Comment: occassionally   Drug use: Not Currently   Sexual activity: Not Currently  Other Topics Concern   Not on file  Social History Narrative   Not on file   Social Determinants of Health   Financial Resource Strain: Not on file  Food Insecurity: Not on file  Transportation Needs: Not on file  Physical Activity: Not on file  Stress: Not on file  Social Connections: Not on file   Past Surgical History:  Procedure Laterality Date   ANTERIOR CERVICAL DECOMP/DISCECTOMY FUSION N/A 07/04/2020   Procedure: ANTERIOR CERVICAL DECOMPRESSION/DISCECTOMY FUSION 1 LEVEL;  Surgeon: Venetia Night, MD;  Location: [facility] ORS;  Service: Neurosurgery;  Laterality: N/A;   No past medical history on file. Ht 5' 7.5" (1.715 m)   BMI 29.47 kg/m   Opioid Risk Score:   Fall Risk Score:  `1  Depression screen PHQ 2/9  Depression screen PHQ 2/9 08/29/2020  Decreased  Interest 0  Down, Depressed, Hopeless 0  PHQ - 2 Score 0  Altered sleeping 0  Tired, decreased energy 0  Change in appetite 0  Feeling bad or failure about yourself  0  Trouble concentrating 0  Moving slowly or fidgety/restless 0  Suicidal thoughts 0  PHQ-9 Score 0      Review of Systems  Musculoskeletal:  Positive for gait problem and neck pain.  Neurological:  Positive for tremors, weakness and headaches.  All other systems reviewed and are negative.     Objective:   Physical Exam Awake, alert, appropriate, bright, in manual w/c, NAD  MS: Biceps 4/5, triceps 4+/5, WE 5-/5 grip 4/5, and FA 4/5 B/L  LE's- HF 4-/5 and KE 4/5, DF 3/5 on R and 4/5 on L; PF 4/5 B/L  Neuro: Sensation to light touch decreased from C5 down No clonus; brisk hoffmans' B/L  MAS of 1 in Ues and LE's       Assessment & Plan:     Pt is a 60 yr old female with hx of cervical myelopathy/central cord syndrome  (C4 ASIC C) secondary to a fall 3/22- with B/L foot drop and AFOs, spasticity, nerve pain, neurogenic bowel and bladder with frequency, and Dysphagia- was on C3 thin diet- Here for f/u on her SCI.  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 Now needs to find a place to stay. Goal to go back to [place]- they are looking for w/c accessible apartment.  You need to let me know me know when leaving nursing home- give me a heads up by 1 month or so.   3. the patient requires an ultra light weight w/c- foldable manual w/c- with spasticity and  variable LE edema -so needs foot plates  specific for spasticity and edema- not closed in around legs. Also needs anti-tippers- will also needs pressure relieving cushion- no hx of pressure ulcers, but is an incomplete quadriplegic ASIA D- NOT walking. Might benefit from power assist wheels, if possible, due to UE  /shoulder pain, neuropathy/decreased sensation that makes it difficult for her to push w/c longer distances.  Have left message for Barbara Cower at [facility] see if she can get loaner when leaves nursing home and be fitted for new w/c. Needs Rx, but need to see if was fitted in hospital for w/c/eval first.   4. Con't Baclofen and Zanaflex dosing as wlel as Keppra- all these meds are working well- don't want to change dosing.  5. Needs to call disability- pt still w/c dependent and cannot walk due to severe sensory loss accompanying her SCI/quadriplegia.   6. Doesn't need L AFO anymore!  7. Con't tramadol 50 mg q6 hours prn- usually takes 1-2x/day so doing well.   8. Once leaves nursing home, can take over Tramadol, but would need opiate contract and UDS  9. F/U in 3 months- double appt- SCI  10. We discussed how great she's doing sober and clean and needs to stay that way so can prescribe tramadol once out of nursing home.    I spent a total of 36 minutes on visit- as detailed above- specifically about staying sober/clean.

## 2021-02-11 ENCOUNTER — Other Ambulatory Visit: Payer: Self-pay

## 2021-02-11 ENCOUNTER — Ambulatory Visit (INDEPENDENT_AMBULATORY_CARE_PROVIDER_SITE_OTHER): Payer: Medicaid Other | Admitting: Urology

## 2021-02-11 VITALS — BP 127/81 | HR 76

## 2021-02-11 DIAGNOSIS — N3946 Mixed incontinence: Secondary | ICD-10-CM

## 2021-02-11 MED ORDER — MIRABEGRON ER 50 MG PO TB24: 50.0000 mg | ORAL_TABLET | Freq: Every day | ORAL | 11 refills | Status: DC

## 2021-02-11 MED ORDER — MIRABEGRON ER 50 MG PO TB24
50.0000 mg | ORAL_TABLET | Freq: Every day | ORAL | 11 refills | Status: DC
Start: 2021-02-11 — End: 2021-06-17

## 2021-02-11 MED ORDER — TRIMETHOPRIM 100 MG PO TABS: 100.0000 mg | ORAL_TABLET | Freq: Every day | ORAL | 11 refills | Status: AC

## 2021-02-11 NOTE — Progress Notes (Signed)
02/11/2021 2:10 PM   Wendelyn Breslow 07/04/1960 [id number]  Referring provider: No referring provider defined for this encounter.  Chief Complaint  Patient presents with   Urinary Incontinence    HPI: Was consulted to assess the patient's urinary incontinence.  Some of the details were difficult.  It appears she had a cervical fusion February 2022.  It is thought that she has a neurogenic bladder and bowel.  She is on darifenacin based upon medical record.  It looks like she had a positive urine culture twice in March 2022  Her primary complaint is bedwetting.  It is high-volume.  She can toilet quite well and transfer from a wheelchair during the day and generally is not leaking.     Its difficult to say but patient certainly has risk factors for neurogenic bladder.  She has had 2 positive cultures.  I gave her 6 or 7 weeks of Myrbetriq 50 mg samples.  I will see her back in about 6 weeks and perform a pelvic examination and cystoscopy.  I will get another urine at that time.  If there is any question on urinary tract infection she will be placed on prophylaxis as a treatment option.  I do not think I will order urodynamics at this stage   Patient once again cannot give a good history.  She seemed a bit sleepy.  Frequency stable.  She says Myrbetriq is helping but was nonspecific Cystoscopy: Patient underwent flexible cystoscopy using sterile technique.  Bladder mucosa and trigone were normal.  No stitch or foreign body or carcinoma.  Urine was a bit cloudy with white flecks.  Cath urine was sent for culture    I think the patient stay on Myrbetriq.  I will put the patient on trimethoprim 100 mg 3x11 and reassess in 8 weeks.  Call if culture positive.  Today Frequency stable.  Last culture was positive Dry during the day and night and very pleased.  No longer has high-volume bedwetting.  Clinically not infected on Myrbetriq and trimethoprim     PMH: No past medical history on  file.  Surgical History: Past Surgical History:  Procedure Laterality Date   ANTERIOR CERVICAL DECOMP/DISCECTOMY FUSION N/A 07/04/2020   Procedure: ANTERIOR CERVICAL DECOMPRESSION/DISCECTOMY FUSION 1 LEVEL;  Surgeon: Venetia Night, MD;  Location: [facility] ORS;  Service: Neurosurgery;  Laterality: N/A;    Home Medications:  Allergies as of 02/11/2021       Reactions   Codeine Anaphylaxis   Penicillins Anaphylaxis   Dantrolene Other (See Comments)   Pt had confusion, dizziness and "off" with 50 mg Dantrolene   Lyrica [pregabalin] Other (See Comments)   Edema        Medication List        Accurate as of February 11, 2021  2:10 PM. If you have any questions, ask your nurse or doctor.          acetaminophen 325 MG tablet Commonly known as: TYLENOL Take 1-2 tablets (325-650 mg total) by mouth every 4 (four) hours as needed for mild pain.   baclofen 10 MG tablet Commonly known as: LIORESAL Take by mouth.   cyanocobalamin 2000 MCG tablet Take 1 tablet (2,000 mcg total) by mouth daily.   DULoxetine 30 MG capsule Commonly known as: CYMBALTA Take 3 capsules (90 mg total) by mouth at bedtime.   famotidine 20 MG tablet Commonly known as: PEPCID Take 1 tablet (20 mg total) by mouth at bedtime.   folic acid 1 MG tablet  Commonly known as: FOLVITE Take 1 tablet (1 mg total) by mouth daily.   gabapentin 100 MG capsule Commonly known as: NEURONTIN Take 100 mg by mouth 3 (three) times daily. 2 caps TID   Gerhardt's butt cream Crea Apply 1 application topically 3 (three) times daily.   levETIRAcetam 500 MG tablet Commonly known as: KEPPRA Take by mouth.   lidocaine 5 % Commonly known as: LIDODERM Place 1 patch onto the skin daily. Remove & Discard patch within 12 hours or as directed by MD   mirabegron ER 50 MG Tb24 tablet Commonly known as: MYRBETRIQ Take 1 tablet (50 mg total) by mouth daily.   multivitamin with minerals Tabs tablet Take 1 tablet by mouth  daily.   oxybutynin 5 MG tablet Commonly known as: DITROPAN Take 5 mg by mouth 3 (three) times daily.   polyethylene glycol 17 g packet Commonly known as: MIRALAX / GLYCOLAX Take 17 g by mouth 2 (two) times daily.   senna-docusate 8.6-50 MG tablet Commonly known as: Senokot-S Take 2 tablets by mouth at bedtime.   thiamine 100 MG tablet Take 1 tablet (100 mg total) by mouth daily.   tiZANidine 4 MG tablet Commonly known as: ZANAFLEX Take 1 tablet (4 mg total) by mouth 2 (two) times daily.   traMADol 50 MG tablet Commonly known as: ULTRAM Take 50 mg by mouth every 6 (six) hours as needed.   Vitamin D (Ergocalciferol) 1.25 MG (50000 UNIT) Caps capsule Commonly known as: DRISDOL Take 50,000 Units by mouth every 7 (seven) days.   white petrolatum Oint Commonly known as: VASELINE Apply 1 application topically 2 (two) times daily.        Allergies:  Allergies  Allergen Reactions   Codeine Anaphylaxis   Penicillins Anaphylaxis   Dantrolene Other (See Comments)    Pt had confusion, dizziness and "off" with 50 mg Dantrolene   Lyrica [Pregabalin] Other (See Comments)    Edema    Family History: Family History  Problem Relation Age of Onset   Diabetes Mother    Diabetes Father    Heart disease Neg Hx     Social History:  reports that she has been smoking cigarettes. She has a 7.50 pack-year smoking history. She has never used smokeless tobacco. She reports current alcohol use. She reports that she does not currently use drugs.  ROS:                                        Physical Exam: BP 127/81   Pulse 76   Constitutional:  Alert and oriented, No acute distress. HEENT: [place] AT, moist mucus membranes.  Trachea midline, no masses.   Laboratory Data: Lab Results  Component Value Date   WBC 7.7 08/14/2020   HGB 13.4 08/14/2020   HCT 40.2 08/14/2020   MCV 101.8 (H) 08/14/2020   PLT 372 08/14/2020    Lab Results  Component Value Date    CREATININE 0.73 08/14/2020    No results found for: PSA  No results found for: TESTOSTERONE  No results found for: HGBA1C  Urinalysis    Component Value Date/Time   COLORURINE YELLOW 08/08/2020 1122   APPEARANCEUR Cloudy (A) 12/17/2020 1439   LABSPEC >1.030 (H) 08/08/2020 1122   PHURINE 5.5 08/08/2020 1122   GLUCOSEU Negative 12/17/2020 1439   HGBUR TRACE (A) 08/08/2020 1122   BILIRUBINUR Negative 12/17/2020 1439   KETONESUR  NEGATIVE 08/08/2020 1122   PROTEINUR Negative 12/17/2020 1439   PROTEINUR NEGATIVE 08/08/2020 1122   NITRITE Positive (A) 12/17/2020 1439   NITRITE POSITIVE (A) 08/08/2020 1122   LEUKOCYTESUR 1+ (A) 12/17/2020 1439   LEUKOCYTESUR SMALL (A) 08/08/2020 1122    Pertinent Imaging:   Assessment & Plan: Both prescriptions 30x11 sent to pharmacy and I will see in 1 year  There are no diagnoses linked to this encounter.  No follow-ups on file.  Martina Sinner, MD  [facility] [address] [place], [place] [address] [phone]

## 2021-02-15 ENCOUNTER — Telehealth: Payer: Self-pay | Admitting: Urology

## 2021-02-15 NOTE — Telephone Encounter (Signed)
Ms. Tomasa Rand is the Charge Nurse at [facility] where the patient resides. She needs to speak with someone in regards to a medication that Dr. Sherron Monday prescribed for the patient, [facility]), as it may conflict with another medication she is currently taking. Her callback number is [phone].

## 2021-02-15 NOTE — Telephone Encounter (Signed)
Spoke with ms. Tomasa Rand pt was supposed to DC oxybutynin. I advised ms cunningham to have pt stop taking oxybutynin and take myrbetriq. She verbalized understanding/

## 2021-04-05 ENCOUNTER — Ambulatory Visit: Payer: Medicaid Other | Admitting: Podiatry

## 2021-05-01 ENCOUNTER — Encounter
Payer: Medicaid Other | Attending: Physical Medicine and Rehabilitation | Admitting: Physical Medicine and Rehabilitation

## 2021-05-01 ENCOUNTER — Encounter: Payer: Self-pay | Admitting: Physical Medicine and Rehabilitation

## 2021-05-01 ENCOUNTER — Other Ambulatory Visit: Payer: Self-pay

## 2021-05-01 VITALS — BP 121/77 | HR 87

## 2021-05-01 DIAGNOSIS — Z993 Dependence on wheelchair: Secondary | ICD-10-CM | POA: Insufficient documentation

## 2021-05-01 DIAGNOSIS — R252 Cramp and spasm: Secondary | ICD-10-CM | POA: Diagnosis present

## 2021-05-01 DIAGNOSIS — N319 Neuromuscular dysfunction of bladder, unspecified: Secondary | ICD-10-CM | POA: Diagnosis present

## 2021-05-01 DIAGNOSIS — G8252 Quadriplegia, C1-C4 incomplete: Secondary | ICD-10-CM | POA: Insufficient documentation

## 2021-05-01 DIAGNOSIS — S14124D Central cord syndrome at C4 level of cervical spinal cord, subsequent encounter: Secondary | ICD-10-CM | POA: Insufficient documentation

## 2021-05-01 NOTE — Progress Notes (Signed)
Subjective:    Patient ID: Yvette Phillips, female    DOB: 22-Nov-1960, 60 y.o.   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    Hasn't found a w/c accessible apartment - til first of year.  Still needs to use w/c for longer distances.  Cannot walk more than 75-100 ft, but exhausted when done walking.     Saw Dr Sherron Monday- he gave her Myrbetriq samples- for bladder- stopped Oxybutynin.  Did help her bladder- a lot.  Helped more than Oxybutynin- more alert and still helps   Still sleeping well  Still on Baclofen, Zanaflex for spasticity- and Keppra for nerve pain- and going well.  Continues to take Tramadol ~ 1-2x/day- for pain- esp when was doing therapy, but they aren't doing right now.    Pain Inventory Average Pain 0 Pain Right Now 0 My pain is intermittent and sharp  In the last 24 hours, has pain interfered with the following? General activity 4 Relation with others 6 Enjoyment of life 6 What TIME of day is your pain at its worst? evening Sleep (in general) Good  Pain is worse with: bending, sitting, and some activites Pain improves with: rest, pacing activities, and medication Relief from Meds: 7  Family History  Problem Relation Age of Onset   Diabetes Mother    Diabetes Father    Heart disease Neg Hx    Social History   Socioeconomic History   Marital status: Single    Spouse name: Not on file   Number of children: Not on file   Years of education: Not on file    Highest education level: Not on file  Occupational History   Not on file  Tobacco Use   Smoking status: Every Day    Packs/day: 0.50    Years: 15.00    Pack years: 7.50    Types: Cigarettes   Smokeless tobacco: Never  Vaping Use   Vaping Use: Never used  Substance and Sexual Activity   Alcohol use: Yes    Comment: occassionally   Drug use: Not Currently   Sexual activity: Not Currently  Other Topics Concern   Not on file  Social History Narrative   Not on file   Social Determinants of Health   Financial Resource Strain: Not on file  Food Insecurity: Not on file  Transportation Needs: Not on file  Physical Activity: Not on file  Stress: Not on file  Social Connections: Not on file   Past Surgical History:  Procedure Laterality Date   ANTERIOR CERVICAL DECOMP/DISCECTOMY FUSION N/A 07/04/2020   Procedure: ANTERIOR CERVICAL DECOMPRESSION/DISCECTOMY FUSION 1 LEVEL;  Surgeon: Venetia Night, MD;  Location: [facility] ORS;  Service: Neurosurgery;  Laterality: N/A;   Past Surgical History:  Procedure Laterality Date   ANTERIOR CERVICAL DECOMP/DISCECTOMY FUSION N/A 07/04/2020   Procedure: ANTERIOR CERVICAL DECOMPRESSION/DISCECTOMY FUSION 1 LEVEL;  Surgeon: Venetia Night, MD;  Location: [facility] ORS;  Service: Neurosurgery;  Laterality: N/A;   No past medical history on file. BP 121/77   Pulse 87   SpO2 95%   Opioid Risk Score:   Fall Risk Score:  `1  Depression screen PHQ 2/9  Depression screen [facility] 2/9 01/30/2021 08/29/2020  Decreased Interest 0 0  Down, Depressed, Hopeless 0 0  PHQ - 2 Score 0 0  Altered sleeping - 0  Tired, decreased energy - 0  Change in appetite - 0  Feeling bad or failure about yourself  - 0  Trouble concentrating - 0  Moving slowly or fidgety/restless - 0  Suicidal thoughts - 0  PHQ-9 Score - 0     Review of Systems  Musculoskeletal:  Positive for neck pain.       Back of lower right leg  Neurological:  Positive for weakness.  All other  systems reviewed and are negative.     Objective:   Physical Exam  Awake, alert, appropriate, in manual w/c; wearing R AFO, NAD (not L AFO which doesn't need anymore) MS: Deltoids 4-/5, biceps 4/5, triceps 3+/5; WE 4+/5, grip 4+/5, and finger abd 4-/5 LE's- RLE- HF 4-/5; KE 4/5; DF 4-/5 and PF 4/5 LLE- HF 3+/5 ; KE 4/5; DF 4/5; PF 4/5  Neuro: No clonus; Hoffman's Brisk on RUE MAS of 1 to 1+ in UE and LE's- getting better Sensation to light touch decreased C5 downwards to S5  LE edema 1-2+ to mid calf B/L      Assessment & Plan:   Pt is a 60 yr old female with hx of cervical myelopathy/central cord syndrome  (C4 ASIC C) secondary to a fall 3/22- with B/L foot drop and AFOs, spasticity, nerve pain, neurogenic bowel and bladder with frequency, and Dysphagia- was on C3 thin diet- Here for f/u on her SCI.  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 Should have has w/c eval in acute CIR- so will look for this.   2. Can stop wearing AFO on R leg- use just as needed.    3. Con't Baclofen, Zanaflex, and Keppra for pain and spasticity   4. Con't tramadol 50 mg q6 hours prn- usually 1-2x/day- can take over tramadol Rx, but would need Opiate contract; and drug screen.   5. Needs to follow up with Dr Alfredo Martinez- the Urologist for bladder/to get a prescription for Myrbetriq.    6. Let me know 1 week PRIOR to leaving the nursing home!   7. Will need to get to appointments- needs to set up to get to appointments/transportation.    8. F/U  - 3 months. Double appointment - [place]  I spent a total of 31 minutes on total visit- calling w/c rep from room to arrange for w/c and other equipment once leaves nursing home- and discussing no more R AFO unless she's tired. Also to f/u with Urology- we discussed this at length.

## 2021-05-01 NOTE — Patient Instructions (Signed)
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 Should have has w/c eval in acute CIR- so will look for this.   2. Can stop wearing AFO on R leg- use just as needed.    3. Con't Baclofen, Zanaflex, and Keppra for pain and spasticity   4. Con't tramadol 50 mg q6 hours prn- usually 1-2x/day- can take over tramadol Rx, but would need Opiate contract; and drug screen.   5. Needs to follow up with Dr Alfredo Martinez- the Urologist for bladder/to get a prescription for Myrbetriq.    6. Let me know 1 week PRIOR to leaving the nursing home!   7. Will need to get to appointments- needs to set up to get to appointments/transportation.    8. F/U - 3 months. Double appointment - SCI

## 2021-06-17 ENCOUNTER — Ambulatory Visit (INDEPENDENT_AMBULATORY_CARE_PROVIDER_SITE_OTHER): Payer: Medicaid Other | Admitting: Urology

## 2021-06-17 ENCOUNTER — Other Ambulatory Visit: Payer: Self-pay

## 2021-06-17 VITALS — BP 139/81 | HR 93

## 2021-06-17 DIAGNOSIS — N3946 Mixed incontinence: Secondary | ICD-10-CM | POA: Diagnosis not present

## 2021-06-17 NOTE — Progress Notes (Signed)
06/17/2021 1:43 PM   Yvette Phillips 1961-02-16 [id number]  Referring provider: Lacinda Axon [address] [place],  [place] [address]  Chief Complaint  Patient presents with   Medication Refill    HPI: Was consulted to assess the patient's urinary incontinence.  Some of the details were difficult.  It appears she had a cervical fusion February 2022.  It is thought that she has a neurogenic bladder and bowel.  She is on darifenacin based upon medical record.  It looks like she had a positive urine culture twice in March 2022  Her primary complaint is bedwetting.  It is high-volume.  She can toilet quite well and transfer from a wheelchair during the day and generally is not leaking.     Its difficult to say but patient certainly has risk factors for neurogenic bladder.  She has had 2 positive cultures.  I gave her 6 or 7 weeks of Myrbetriq 50 mg samples.  I will see her back in about 6 weeks and perform a pelvic examination and cystoscopy.  I will get another urine at that time.  If there is any question on urinary tract infection she will be placed on prophylaxis as a treatment option.  I do not think I will order urodynamics at this stage   Patient once again cannot give a good history.  She seemed a bit sleepy.  Frequency stable.  She says Myrbetriq is helping but was nonspecific Cystoscopy: Patient underwent flexible cystoscopy using sterile technique.  Bladder mucosa and trigone were normal.  No stitch or foreign body or carcinoma.  Urine was a bit cloudy with white flecks.  Cath urine was sent for culture     I think the patient stay on Myrbetriq.  I will put the patient on trimethoprim 100 mg 3x11 and reassess in 8 weeks.  Call if culture positive.   Last culture was positive Dry during the day and night and very pleased.  No longer has high-volume bedwetting.  Clinically not infected on Myrbetriq and trimethoprim and both prescriptions renewed in September 2022  Today Clinically  she says she is doing well on trimethoprim suppression therapy with no infections.  Incontinence much better and does not appear she is on Myrbetriq.  Clinically not infected.   PMH: No past medical history on file.  Surgical History: Past Surgical History:  Procedure Laterality Date   ANTERIOR CERVICAL DECOMP/DISCECTOMY FUSION N/A 07/04/2020   Procedure: ANTERIOR CERVICAL DECOMPRESSION/DISCECTOMY FUSION 1 LEVEL;  Surgeon: Venetia Night, MD;  Location: [facility] ORS;  Service: Neurosurgery;  Laterality: N/A;    Home Medications:  Allergies as of 06/17/2021       Reactions   Codeine Anaphylaxis   Penicillins Anaphylaxis   Dantrolene Other (See Comments)   Pt had confusion, dizziness and "off" with 50 mg Dantrolene   Lyrica [pregabalin] Other (See Comments)   Edema        Medication List        Accurate as of June 17, 2021  1:43 PM. If you have any questions, ask your nurse or doctor.          STOP taking these medications    mirabegron ER 50 MG Tb24 tablet Commonly known as: MYRBETRIQ Stopped by: Martina Sinner, MD       TAKE these medications    acetaminophen 325 MG tablet Commonly known as: TYLENOL Take 1-2 tablets (325-650 mg total) by mouth every 4 (four) hours as needed for mild pain.   baclofen 10  MG tablet Commonly known as: LIORESAL Take by mouth.   cyanocobalamin 2000 MCG tablet Take 1 tablet (2,000 mcg total) by mouth daily.   DULoxetine 30 MG capsule Commonly known as: CYMBALTA Take 3 capsules (90 mg total) by mouth at bedtime.   famotidine 20 MG tablet Commonly known as: PEPCID Take 1 tablet (20 mg total) by mouth at bedtime.   folic acid 1 MG tablet Commonly known as: FOLVITE Take 1 tablet (1 mg total) by mouth daily.   gabapentin 100 MG capsule Commonly known as: NEURONTIN Take 100 mg by mouth 3 (three) times daily. 2 caps TID   Gerhardt's butt cream Crea Apply 1 application topically 3 (three) times daily.   levETIRAcetam  500 MG tablet Commonly known as: KEPPRA Take by mouth.   lidocaine 5 % Commonly known as: LIDODERM Place 1 patch onto the skin daily. Remove & Discard patch within 12 hours or as directed by MD   multivitamin with minerals Tabs tablet Take 1 tablet by mouth daily.   polyethylene glycol 17 g packet Commonly known as: MIRALAX / GLYCOLAX Take 17 g by mouth 2 (two) times daily.   senna-docusate 8.6-50 MG tablet Commonly known as: Senokot-S Take 2 tablets by mouth at bedtime.   thiamine 100 MG tablet Take 1 tablet (100 mg total) by mouth daily.   tiZANidine 4 MG tablet Commonly known as: ZANAFLEX Take 1 tablet (4 mg total) by mouth 2 (two) times daily.   traMADol 50 MG tablet Commonly known as: ULTRAM Take 50 mg by mouth every 6 (six) hours as needed.   trimethoprim 100 MG tablet Commonly known as: TRIMPEX Take 1 tablet (100 mg total) by mouth daily.   Vitamin D (Ergocalciferol) 1.25 MG (50000 UNIT) Caps capsule Commonly known as: DRISDOL Take 50,000 Units by mouth every 7 (seven) days.   white petrolatum Oint Commonly known as: VASELINE Apply 1 application topically 2 (two) times daily.        Allergies:  Allergies  Allergen Reactions   Codeine Anaphylaxis   Penicillins Anaphylaxis   Dantrolene Other (See Comments)    Pt had confusion, dizziness and "off" with 50 mg Dantrolene   Lyrica [Pregabalin] Other (See Comments)    Edema    Family History: Family History  Problem Relation Age of Onset   Diabetes Mother    Diabetes Father    Heart disease Neg Hx     Social History:  reports that she has been smoking cigarettes. She has a 7.50 pack-year smoking history. She has never used smokeless tobacco. She reports current alcohol use. She reports that she does not currently use drugs.  ROS:                                        Physical Exam: BP 139/81    Pulse 93   Constitutional:  Alert and oriented, No acute distress. HEENT: [place]  AT, moist mucus membranes.  Trachea midline, no masses.   Laboratory Data: Lab Results  Component Value Date   WBC 7.7 08/14/2020   HGB 13.4 08/14/2020   HCT 40.2 08/14/2020   MCV 101.8 (H) 08/14/2020   PLT 372 08/14/2020    Lab Results  Component Value Date   CREATININE 0.73 08/14/2020    No results found for: PSA  No results found for: TESTOSTERONE  No results found for: HGBA1C  Urinalysis  Component Value Date/Time   COLORURINE YELLOW 08/08/2020 1122   APPEARANCEUR Cloudy (A) 12/17/2020 1439   LABSPEC >1.030 (H) 08/08/2020 1122   PHURINE 5.5 08/08/2020 1122   GLUCOSEU Negative 12/17/2020 1439   HGBUR TRACE (A) 08/08/2020 1122   BILIRUBINUR Negative 12/17/2020 1439   KETONESUR NEGATIVE 08/08/2020 1122   PROTEINUR Negative 12/17/2020 1439   PROTEINUR NEGATIVE 08/08/2020 1122   NITRITE Positive (A) 12/17/2020 1439   NITRITE POSITIVE (A) 08/08/2020 1122   LEUKOCYTESUR 1+ (A) 12/17/2020 1439   LEUKOCYTESUR SMALL (A) 08/08/2020 1122    Pertinent Imaging:   Assessment & Plan: Trimethoprim 30x11 sent to pharmacy and I will see in 1 year  There are no diagnoses linked to this encounter.  No follow-ups on file.  Martina Sinner, MD  [facility] [address] [place], [place] [address] [phone]

## 2021-07-31 ENCOUNTER — Encounter: Payer: Self-pay | Admitting: Physical Medicine and Rehabilitation

## 2021-07-31 ENCOUNTER — Other Ambulatory Visit: Payer: Self-pay

## 2021-07-31 ENCOUNTER — Encounter
Payer: Medicaid Other | Attending: Physical Medicine and Rehabilitation | Admitting: Physical Medicine and Rehabilitation

## 2021-07-31 VITALS — BP 112/71 | HR 78 | Temp 98.5°F | Ht 67.5 in | Wt 276.0 lb

## 2021-07-31 DIAGNOSIS — N319 Neuromuscular dysfunction of bladder, unspecified: Secondary | ICD-10-CM | POA: Diagnosis present

## 2021-07-31 DIAGNOSIS — R252 Cramp and spasm: Secondary | ICD-10-CM | POA: Diagnosis not present

## 2021-07-31 DIAGNOSIS — Z993 Dependence on wheelchair: Secondary | ICD-10-CM | POA: Insufficient documentation

## 2021-07-31 DIAGNOSIS — M792 Neuralgia and neuritis, unspecified: Secondary | ICD-10-CM | POA: Diagnosis not present

## 2021-07-31 DIAGNOSIS — G8929 Other chronic pain: Secondary | ICD-10-CM | POA: Diagnosis present

## 2021-07-31 DIAGNOSIS — G8252 Quadriplegia, C1-C4 incomplete: Secondary | ICD-10-CM | POA: Insufficient documentation

## 2021-07-31 DIAGNOSIS — M25561 Pain in right knee: Secondary | ICD-10-CM | POA: Diagnosis not present

## 2021-07-31 DIAGNOSIS — M25562 Pain in left knee: Secondary | ICD-10-CM | POA: Diagnosis present

## 2021-07-31 NOTE — Progress Notes (Signed)
? ?Subjective:  ? ? Patient ID: Yvette Phillips, female    DOB: [birth year], 61 y.o.   MRN: [id number] ? 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 Here for f/u on quadriplegia.  ? ?Stopped using R AFO.  ?Hasn't left the nursing home- did go for interview for apartment-  ?30 days until they run background check, etc.  ?Interviewed in last week of February 2023.  ? ?Is in [place] when leaves nursing home.  ? ?No more issues with needing to pee all the time. Thinks is emptying her bladder- 3-4x/day will void. However is on Toviaz for bladder incontinence- when originally in hosiptal, had CONSTANT incontinence.  ?Also on Bactrim daily for UTI prevention.  ? ? ?Stable on keppra, Zanaflex and Baclofen.  ?Spasms/spasticity is ok.  ? ?Is walking- with rollator- ~ 100 ft at a time max-  ?Makes bed daily and  ? ?Pain- has tramadol prn- takes when really in pain- ~1-2x/day- usually takes tylenol when hurts.  ? ?Disability started this month! ? ?Pain Inventory ?Average Pain 6 ?Pain Right Now 0 ?My pain is intermittent, sharp, burning, and stabbing ? ?LOCATION OF PAIN  Right Knee ? ?BOWEL ?Number of stools per week: 2-3 ?Oral laxative use Yes  ?Type of laxative Miralax ? ?BLADDER ?Normal and Pads ? ? ? ?Mobility ?walk with assistance ?use a walker ?how many minutes can you walk? Maybe 10-15 minutes ?ability to climb steps?  yes ?do you drive?  no ?use a wheelchair ?transfers alone ?Do you have any goals in this area?  yes ? ?Function ?disabled: date disabled 2023 ?I need assistance with the following:  household duties and shopping ?Do you have any goals in this area?   yes ? ?Neuro/Psych ?weakness ?numbness ?tremor ?tingling ?trouble walking ? ?Prior Studies ?Any changes since last visit?  no ? ?Physicians involved in your care ?Any changes since last visit?  no ? ? ?Family History  ?Problem Relation Age of Onset  ? Diabetes Mother   ? Diabetes Father   ? Heart disease Neg Hx   ? ?Social History  ? ?Socioeconomic History  ? Marital status: Single  ?  Spouse name: Not on file  ? Number of children: Not on file  ? Years of education: Not on file  ? Highest education level: Not on file  ?Occupational History  ? Not on file  ?Tobacco Use  ? Smoking status: Former  ?  Packs/day: 0.50  ?  Years: 15.00  ?  Pack years: 7.50  ?  Types: Cigarettes  ? Smokeless tobacco: Never  ?Vaping Use  ? Vaping Use: Never used  ?Substance and Sexual Activity  ? Alcohol use: Not Currently  ?  Comment: occassionally  ? Drug use: Not Currently  ? Sexual activity: Not Currently  ?Other Topics Concern  ? Not on file  ?Social History Narrative  ? Not on file  ? ?Social Determinants of Health  ? ?Financial Resource Strain: Not on file  ?Food Insecurity: Not on file  ?Transportation Needs: Not on file  ?Physical Activity: Not on file  ?Stress:  Not on file  ?Social Connections: Not on file  ? ?Past Surgical History:  ?Procedure Laterality Date  ? ANTERIOR CERVICAL DECOMP/DISCECTOMY FUSION N/A 07/04/2020  ? Procedure: ANTERIOR CERVICAL DECOMPRESSION/DISCECTOMY FUSION 1 LEVEL;  Surgeon: Meade Maw, MD;  Location: [facility] ORS;  Service: Neurosurgery;  Laterality: N/A;  ? ?History reviewed. No pertinent past medical history. ?BP 112/71   Pulse 78   Temp 98.5 ?F (36.9 ?C)   Ht 5' 7.5" (1.715 m)   Wt 276 lb (125.2 kg) Comment: at nursing home on 07/29/2021. W/C  SpO2 99%   BMI 42.59 kg/m?  ? ?Opioid Risk Score:   ?Fall Risk Score:  `1 ? ?Depression screen PHQ 2/9 ? ?Depression screen [facility] 2/9 01/30/2021 08/29/2020  ?Decreased Interest 0 0  ?Down, Depressed, Hopeless 0 0  ?PHQ - 2 Score 0 0  ?Altered sleeping - 0   ?Tired, decreased energy - 0  ?Change in appetite - 0  ?Feeling bad or failure about yourself  - 0  ?Trouble concentrating - 0  ?Moving slowly or fidgety/restless - 0  ?Suicidal thoughts - 0  ?PHQ-9 Score - 0  ?  ?Review of Systems  ?Musculoskeletal:  Positive for gait problem.  ?     Tingling  ?Neurological:  Positive for tremors, weakness and numbness.  ? ?   ?Objective:  ? Physical Exam ?Awake, alert, appropriate, in manual w/c; NAD ? ? ?Neuro: decreased to light touch in C4-L3 B/L- but absent L4-S1 B/L  ?Brisk hoffman's on RUE ?Ues MAS of 1 B/L  ?And LE's B/L 1+ to 2 esp at knees/hips ? ? ?MS: ?Delt 4-/5; biceps 4/5; triceps 4-/5; WE 4+/5; grip 4+/5; and FA 4-/5 B/L  ?LE's- HF 4-/5; KE 4/5; DF 4/5; PF 4/5 ?LLE- HF 3+/5; KE 4/5; DF 4-/5 and PF 4/5 ? ? ? ? ? ? ?   ?Assessment & Plan:  ? ?Pt is a 61 yr old female with hx of cervical myelopathy/central cord syndrome  (C4 ASIC C) secondary to a fall 3/22- with B/L foot drop and AFOs, spasticity, nerve pain, neurogenic bowel and bladder with frequency, and Dysphagia- was on D3 thin diet- ?Here for f/u on her SCI.  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 ? ? ?Ready to get out of nursing home and get her own place.  ? ?2.  Will call me for w/c- loaner from [place] right before gets out of nursing home-1 week prior and will arrange. And then will place order for w/c. Then will order w/c. Asap=- will also give Rx for  ? ?3. C4 ASIA D quadriplegic- will need ultra light weight w/c- can walk 100 ft max- but exhausts her- she has spasticity and variable edema in LE's; has all body sensory loss- from C5 down- meets criteria- ?Would need gel cushion to prevent pressure ulcers. Never had pressure ulcer.  ?Might need w/c evaluation again- was in CIR 1/22- so might need a new w/c evaluation. Since it's been 1 year.  ? ? ?3. Con't Keppra, Zanaflex 4 mg BID and Baclofen 10 mg BID for spasticity. ? ?4.  Con't Toviaz and Bactrim for bladder/neurogenic bladder.  ?5. Con't tramadol and tylenol for pain ? ?6.  Needs to get a PCP (community health services) to write for overall meds when gets out. I will prescribe meds for SCI; but I won't do the Bactrim for UTI prevention. Vit D,  etc.  ? ?7. Con't Cymbalta for nerve pain and depression.  ? ? ?8. Don't want to place Urology referral until out of nursing home, but will send to [facility] since will be living there.  ? ?9. Got CAP- can get arrangements for transport to appointments-  ? ?10. F/U in 42months ?Double appt- for SCI   ?

## 2021-07-31 NOTE — Patient Instructions (Signed)
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 ? ? ?Ready to get out of nursing home and get her own place.  ? ?2.  Will call me for w/c- loaner from [employer] right before gets out of nursing home-1 week prior and will arrange. And then will place order for w/c. Then will order w/c. Asap=- will also give Rx for  ? ?3. C4 ASIA D quadriplegic- will need ultra light weight w/c- can walk 100 ft max- but exhausts her- she has spasticity and variable edema in LE's; has all body sensory loss- from C5 down- meets criteria- ?Would need gel cushion to prevent pressure ulcers. Never had pressure ulcer.  ?Might need w/c evaluation again- was in CIR 1/22- so might need a new w/c evaluation. Since it's been 1 year.  ? ? ?3. Con't Keppra, Zanaflex 4 mg BID and Baclofen 10 mg BID for spasticity. ? ?4. Con't Toviaz and Bactrim for bladder/neurogenic bladder.  ?5. Con't tramadol and tylenol for pain ? ?6.  Needs to get a PCP (community health services) to write for overall meds when gets out. I will prescribe meds for SCI; but I won't do the Bactrim for UTI prevention. Vit D, etc.  ? ?7. Con't Cymbalta for nerve pain and depression.  ? ? ?8. Don't want to place Urology referral until out of nursing home, but will send to [facility] since will be living there.  ? ?9. Got CAP- can get arrangements for transport to appointments-  ? ?10. F/U in 48months ?Double appt- for SCI   ?

## 2021-11-20 ENCOUNTER — Encounter: Payer: Self-pay | Admitting: Physical Medicine and Rehabilitation

## 2021-11-20 ENCOUNTER — Encounter
Payer: Medicaid Other | Attending: Physical Medicine and Rehabilitation | Admitting: Physical Medicine and Rehabilitation

## 2021-11-20 VITALS — BP 112/74 | HR 81 | Ht 67.5 in | Wt 314.8 lb

## 2021-11-20 DIAGNOSIS — Z993 Dependence on wheelchair: Secondary | ICD-10-CM | POA: Diagnosis present

## 2021-11-20 DIAGNOSIS — M792 Neuralgia and neuritis, unspecified: Secondary | ICD-10-CM | POA: Diagnosis present

## 2021-11-20 DIAGNOSIS — M25561 Pain in right knee: Secondary | ICD-10-CM | POA: Insufficient documentation

## 2021-11-20 DIAGNOSIS — G8252 Quadriplegia, C1-C4 incomplete: Secondary | ICD-10-CM | POA: Insufficient documentation

## 2021-11-20 DIAGNOSIS — R252 Cramp and spasm: Secondary | ICD-10-CM | POA: Diagnosis present

## 2021-11-20 NOTE — Progress Notes (Signed)
Subjective:    Patient ID: Yvette Phillips, female    DOB: 08-Nov-1960, 61 y.o.   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   Waiting to get "rooms right"- and place she's going- trying to be patient. Is hopeful will occur by the end of year.   Has CAP now- and they are working on things. Will need PCP once leaves nursing home.    Bladder- not as much incontinence- Never saw him back after gave samples of Myrbetriq- at prior appointment- wasn't taking anymore- doing well on Trimethoprim prevention.   Bowel- going OK- off meds- LBM this AM- large.     Walking ~ 100-150 ft - at max 150 ft but tires her out dramatically- walking with RW when doe-s mainly walks in room.   Has gained some weight- not fitting in nursing home w/c and had to be changed out.    Pain- still taking tylenol and sometimes tramadol-  mainly in R knee pain-   Also has "Swelling in B/L thighs".  Supposed to get a cortisone shot- thought she was getting from me. But wasn't set up for shot from me- so maybe will get from someone else?  Pain Inventory Average Pain 6 Pain Right Now 6 My pain is sharp  LOCATION OF PAIN  knee  BOWEL Number of stools per week: 3 Oral laxative use Yes  Type of laxative miralax  Enema or suppository use Yes  dulcolax   BLADDER Normal    Mobility walk with assistance use a walker ability to climb steps?  no use a wheelchair transfers alone  Function disabled: date disabled .  Neuro/Psych tingling spasms  Prior Studies Any changes since last visit?  no  Physicians involved in your care Any  changes since last visit?  no   Family History  Problem Relation Age of Onset   Diabetes Mother    Diabetes Father    Heart disease Neg Hx    Social History   Socioeconomic History   Marital status: Single    Spouse name: Not on file   Number of children: Not on file   Years of education: Not on file   Highest education level: Not on file  Occupational History   Not on file  Tobacco Use   Smoking status: Former    Packs/day: 0.50    Years: 15.00    Total pack years: 7.50    Types: Cigarettes   Smokeless tobacco: Never  Vaping Use   Vaping Use: Never used  Substance and Sexual Activity   Alcohol use: Not Currently    Comment: occassionally   Drug use: Not Currently   Sexual activity: Not Currently  Other Topics Concern   Not on file  Social History Narrative   Not on file   Social Determinants of Health   Financial Resource Strain: Not on file  Food Insecurity: Not on file  Transportation Needs: Not on file  Physical Activity: Not on file  Stress: Not on file  Social Connections: Not on file   Past Surgical  History:  Procedure Laterality Date   ANTERIOR CERVICAL DECOMP/DISCECTOMY FUSION N/A 07/04/2020   Procedure: ANTERIOR CERVICAL DECOMPRESSION/DISCECTOMY FUSION 1 LEVEL;  Surgeon: Venetia Night, MD;  Location: [facility] ORS;  Service: Neurosurgery;  Laterality: N/A;   No past medical history on file. BP 112/74   Pulse 81   Ht 5' 7.5" (1.715 m)   Wt (!) 314 lb 12.8 oz (142.8 kg) Comment: REPORTED  SpO2 95%   BMI 48.58 kg/m   Opioid Risk Score:   Fall Risk Score:  `1  Depression screen [facility] 2/9     11/20/2021   10:58 AM 07/31/2021   10:16 AM 01/30/2021    9:14 AM 08/29/2020   10:27 AM  Depression screen PHQ 2/9  Decreased Interest 0 0 0 0  Down, Depressed, Hopeless 0 0 0 0  PHQ - 2 Score 0 0 0 0  Altered sleeping    0  Tired, decreased energy    0  Change in appetite    0  Feeling bad or failure about yourself     0  Trouble concentrating    0   Moving slowly or fidgety/restless    0  Suicidal thoughts    0  PHQ-9 Score    0     Review of Systems  Constitutional: Negative.   HENT: Negative.    Eyes: Negative.   Respiratory: Negative.    Cardiovascular: Negative.   Gastrointestinal: Negative.   Endocrine: Negative.   Genitourinary: Negative.   Musculoskeletal:        Spasms  Skin: Negative.   Allergic/Immunologic: Negative.   Neurological:        Tingling  Hematological: Negative.   Psychiatric/Behavioral: Negative.    All other systems reviewed and are negative.      Objective:   Physical Exam  Awake, alert, appropriate, has gained substantial weight since last seen; but hair done and looks really cute today, NAD MS: RUE 4+/5 in biceps, triceps, 4-/5 in WE; grip 4/5; and FA 4+/5 LUE- same except WE 4+/5 RLE- HF 4-/5; KE 4-/5; (limited by weight) DF and PF 4/5 LLE- same  Mild R knee effusion- and TTP over patella   Neuro: Decreased to light touch from C4 down to S2 B/L    Substantial swelling- 3+ LE edema pitting-  All the way up to thighs- B/L - hard to tell if just swelling or also due to weight gain      Assessment & Plan:   Pt is a 61 yr old female with 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  1. C4 ASIA D quadriplegic- will need ultra light weight w/c- can walk 100 ft max- but exhausts her- she has spasticity and variable edema in LE's; has all body sensory loss- from C5 down- meets criteria- Would need gel cushion to prevent pressure ulcers. Never had pressure ulcer.  Will need new outpatient therapy evaluation to determine exact type/size etc of ultra light weight w/c. -  Will go with [facility]- and when she calls me, will get her a loaner.     2. If you call me and say you need a  shot  in R knee- then let me know- and I can order at next appointment.  Cortisone shot. But since it wasn't planned, cannot do without prior authorization.    3. Can use deodorant between legs ot help things not stick.   4. Needs PCP once gets out of nursing home ot manage most of meds.    5. Needs the LE swelling/edema addressed by nursing home- suggest checking BMP and double check meds to make sure nothing causing her to retain fluid.   6. Con't Keppra, Zanaflex and Baclofen for spasticity and nerve pain. Also con't tramadol for pain as needed  7. F/U in 3 months- double appt for SCI as well as possible cortisone shot in R knee.   8. Cannot gain more weight, because will be difficult to fit in new w/c.   I spent a total of 26  minutes on total care today- >50% coordination of care and education- due to  Discussion of R knee injection- also education on SCI- long term prognosis and LE edema/swelling-

## 2021-11-20 NOTE — Patient Instructions (Addendum)
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  1. C4 ASIA D quadriplegic- will need ultra light weight w/c- can walk 100 ft max- but exhausts her- she has spasticity and variable edema in LE's; has all body sensory loss- from C5 down- meets criteria- Would need gel cushion to prevent pressure ulcers. Never had pressure ulcer.  Will need new outpatient therapy evaluation to determine exact type/size etc of ultra light weight w/c. - Will go with [facility]- and when she calls me, will get her a loaner.     2. If you call me and say you need a shot  in R knee- then let me know- and I can order at next appointment.  Cortisone shot. But since it wasn't planned, cannot do without prior authorization.    3. Can use deodorant between legs ot help things not stick.   4. Needs PCP once gets out of nursing home ot manage most of meds.    5. Needs the LE swelling/edema addressed by nursing home- suggest checking BMP and double check meds to make sure nothing causing her to retain fluid.   6. Con't Keppra, Zanaflex and Baclofen for spasticity and nerve pain. Also con't tramadol for pain as needed  7. F/U in 3 months- double appt for SCI as well as possible cortisone shot in R knee.   8. Cannot gain more weight, because will be difficult to fit in new w/c if does.

## 2022-02-17 ENCOUNTER — Ambulatory Visit: Payer: Medicaid Other | Admitting: Urology

## 2022-03-21 ENCOUNTER — Encounter: Payer: Self-pay | Admitting: Physical Medicine and Rehabilitation

## 2022-03-21 ENCOUNTER — Encounter
Payer: Medicaid Other | Attending: Physical Medicine and Rehabilitation | Admitting: Physical Medicine and Rehabilitation

## 2022-03-21 VITALS — BP 122/69 | HR 74 | Ht 67.0 in | Wt 300.4 lb

## 2022-03-21 DIAGNOSIS — M1711 Unilateral primary osteoarthritis, right knee: Secondary | ICD-10-CM

## 2022-03-21 DIAGNOSIS — S14124D Central cord syndrome at C4 level of cervical spinal cord, subsequent encounter: Secondary | ICD-10-CM

## 2022-03-21 DIAGNOSIS — G8252 Quadriplegia, C1-C4 incomplete: Secondary | ICD-10-CM

## 2022-03-21 DIAGNOSIS — R252 Cramp and spasm: Secondary | ICD-10-CM | POA: Diagnosis not present

## 2022-03-21 DIAGNOSIS — R635 Abnormal weight gain: Secondary | ICD-10-CM

## 2022-03-21 MED ORDER — TRIAMCINOLONE ACETONIDE 40 MG/ML IJ SUSP
40.0000 mg | Freq: Once | INTRAMUSCULAR | Status: AC
  Administered 2022-03-21: 40 mg via INTRAMUSCULAR

## 2022-03-21 MED ORDER — LIDOCAINE HCL 1 % IJ SOLN
1.0000 mL | Freq: Once | INTRAMUSCULAR | Status: AC
  Administered 2022-03-21: 1 mL via INTRADERMAL

## 2022-03-21 NOTE — Progress Notes (Signed)
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    Got apartment- got keys last week.   Hopefully before Thanksgiving. Should be out of nursing home.    Pt reports R knee been bothering her more- "chasing it in bed"- doesn't bother when laying down, but hurts the most when sitting up or walking- doesn't buckle at least in awhile.   Has rolator to walk with to help her get hospital bed and rolator at d/c form SNF.       Plan: Needs w/c for custom w/c for SCI- called [facility] to get her a proper w/c once gets out of nursing home.  Given Rx for PT/OT evaluation for an ultra light weight manual w/c- also needs pressure relieving cushion- never had pressure ulcer, so likely gel cushion. Pt is ASIA D C4 quadriplegia- can walk up to 75-100 ft max with RW, but tires easily, so still needs manual w/c for propulsion- also has entire body from C4 down sensory loss, which puts her at higher risk of pressure ulcer or tissue damage. Spoke to [place] at [facility] and he will come out to nursing home to get her fitted for loaner/and hopefully have their therapists fits her for w/c.   2. steroid injection was performed at R knee using 1% plain Lidocaine and 40mg  /1cc of Kenalog. This was well tolerated.  Cleaned with betadine x3 and allowed to dry- then alcohol then injected using 27 gauge 1.5 inch needle- no bleeding or complications. NEEDED TO USE COLD SPRAY! Scared of needles.    F/U in 3 months for steroid injections of R knee  Lidocaine will kick in 15 minutes- and wear off tonight- the steroid will kick in tomorrow within 24 hours and take up to 72 hours to fully  kick in.    3. BMI up to 47- - down from 314 lbs in June but was 276 in March 2023- we discussed that pt needs to monitor closely- has stopped a lot of snacks, and some is edema, but not all of it. Can make it more difficult to fit into w/c after ordered- need to keep weight stable.   4. Due to variable edema, will need hospital bed when goes home from nursing home- so can prop her legs up to reduce swelling. Also needs rails to get in/out bed. Her SCI makes it hard to transfer and impossible with hospital bed.   5. Will need rollator to be able ot walk short distances- as an incomplete quadriplegia.    6. Suggest once out of nursing home- would benefit from recumbent bike or treadmill- be careful on treadmill to not go too fast.    7. Once you get out of nursing, home, SCI support group - it's the last Thursday of the month- at [place] 6-7 pm- in 1st floor conference room- EVERY month   8. F/U in 3 months, double appt and R knee injections- steroid.   9. Continue all meds no changes  I spent a total of 42   minutes on total care today- >50% coordination of  care- due to 10 minutes on injection and 32 minutes calling Jason form room and discussing w/c- also write written Rx for ultra light w/c.

## 2022-03-21 NOTE — Patient Instructions (Signed)
Plan: Needs w/c for custom w/c for SCI- called [facility] to get her a proper w/c once gets out of nursing home.  Given Rx for PT/OT evaluation for an ultra light weight manual w/c- also needs pressure relieving cushion- never had pressure ulcer, so likely gel cushion. Pt is ASIA D C4 quadriplegia- can walk up to 75-100 ft max with RW, but tires easily, so still needs manual w/c for propulsion- also has entire body from C4 down sensory loss, which puts her at higher risk of pressure ulcer or tissue damage. Spoke to [place] at [place] and he will come out to nursing home to get her fitted for loaner/and hopefully have their therapists fits her for w/c.   2. steroid injection was performed at R knee using 1% plain Lidocaine and 40mg  /1cc of Kenalog. This was well tolerated.  Cleaned with betadine x3 and allowed to dry- then alcohol then injected using 27 gauge 1.5 inch needle- no bleeding or complications. NEEDED TO USE COLD SPRAY! Scared of needles.    F/U in 3 months for steroid injections of R knee  Lidocaine will kick in 15 minutes- and wear off tonight- the steroid will kick in tomorrow within 24 hours and take up to 72 hours to fully kick in.    3. BMI up to 47- - down from 314 lbs in June but was 276 in March 2023- we discussed that pt needs to monitor closely- has stopped a lot of snacks, and some is edema, but not all of it. Can make it more difficult to fit into w/c after ordered- need to keep weight stable.   4. Due to variable edema, will need hospital bed when goes home from nursing home- so can prop her legs up to reduce swelling. Also needs rails to get in/out bed. Her SCI makes it hard to transfer and impossible with hospital bed.   5. Will need rollator to be able ot walk short distances- as an incomplete quadriplegia.    6. Suggest once out of nursing home- would benefit from recumbent bike or treadmill- be careful on treadmill to not go too fast.    7. Once you get out of nursing,  home, SCI support group - it's the last Thursday of the month- at [place] 6-7 pm- in 1st floor conference room- EVERY month   8. F/U in 40months, double appt and R knee injections- steroid.

## 2022-04-01 ENCOUNTER — Other Ambulatory Visit: Payer: Self-pay | Admitting: Family Medicine

## 2022-04-01 DIAGNOSIS — N950 Postmenopausal bleeding: Secondary | ICD-10-CM

## 2022-04-03 LAB — OPIATE, QUANTITATIVE, URINE
OXYCODONE+OXYMORPHONE UR QL SCN: NEGATIVE
Opiates: NEGATIVE

## 2022-04-07 ENCOUNTER — Ambulatory Visit
Admission: RE | Admit: 2022-04-07 | Discharge: 2022-04-07 | Disposition: A | Discharge status: Home / Self Care | Payer: Medicaid Other | Source: Ambulatory Visit | Attending: Family Medicine | Admitting: Family Medicine

## 2022-04-07 DIAGNOSIS — N950 Postmenopausal bleeding: Secondary | ICD-10-CM

## 2022-06-16 ENCOUNTER — Encounter: Payer: Self-pay | Admitting: Urology

## 2022-06-16 ENCOUNTER — Ambulatory Visit: Payer: Medicaid Other | Admitting: Urology

## 2022-06-20 ENCOUNTER — Encounter
Payer: Medicaid Other | Attending: Physical Medicine and Rehabilitation | Admitting: Physical Medicine and Rehabilitation

## 2022-08-06 ENCOUNTER — Inpatient Hospital Stay
Admission: EM | Admit: 2022-08-06 | Discharge: 2022-08-08 | DRG: 603 | Disposition: A | Discharge status: Home / Self Care | Payer: Medicaid Other | Attending: Internal Medicine | Admitting: Internal Medicine

## 2022-08-06 ENCOUNTER — Other Ambulatory Visit: Payer: Self-pay

## 2022-08-06 ENCOUNTER — Encounter: Payer: Self-pay | Admitting: Emergency Medicine

## 2022-08-06 ENCOUNTER — Emergency Department: Payer: Medicaid Other

## 2022-08-06 DIAGNOSIS — N3281 Overactive bladder: Secondary | ICD-10-CM | POA: Diagnosis present

## 2022-08-06 DIAGNOSIS — Z79899 Other long term (current) drug therapy: Secondary | ICD-10-CM

## 2022-08-06 DIAGNOSIS — Z833 Family history of diabetes mellitus: Secondary | ICD-10-CM

## 2022-08-06 DIAGNOSIS — D72829 Elevated white blood cell count, unspecified: Secondary | ICD-10-CM | POA: Diagnosis present

## 2022-08-06 DIAGNOSIS — Z981 Arthrodesis status: Secondary | ICD-10-CM

## 2022-08-06 DIAGNOSIS — Z880 Allergy status to penicillin: Secondary | ICD-10-CM

## 2022-08-06 DIAGNOSIS — R110 Nausea: Secondary | ICD-10-CM | POA: Diagnosis present

## 2022-08-06 DIAGNOSIS — Z885 Allergy status to narcotic agent status: Secondary | ICD-10-CM

## 2022-08-06 DIAGNOSIS — Z87440 Personal history of urinary (tract) infections: Secondary | ICD-10-CM

## 2022-08-06 DIAGNOSIS — R197 Diarrhea, unspecified: Secondary | ICD-10-CM | POA: Diagnosis present

## 2022-08-06 DIAGNOSIS — G8929 Other chronic pain: Secondary | ICD-10-CM | POA: Diagnosis present

## 2022-08-06 DIAGNOSIS — F32A Depression, unspecified: Secondary | ICD-10-CM | POA: Diagnosis present

## 2022-08-06 DIAGNOSIS — H05011 Cellulitis of right orbit: Secondary | ICD-10-CM | POA: Diagnosis present

## 2022-08-06 DIAGNOSIS — Z6841 Body Mass Index (BMI) 40.0 and over, adult: Secondary | ICD-10-CM

## 2022-08-06 DIAGNOSIS — G40909 Epilepsy, unspecified, not intractable, without status epilepticus: Secondary | ICD-10-CM | POA: Diagnosis present

## 2022-08-06 DIAGNOSIS — Z87891 Personal history of nicotine dependence: Secondary | ICD-10-CM | POA: Diagnosis not present

## 2022-08-06 DIAGNOSIS — L03213 Periorbital cellulitis: Principal | ICD-10-CM | POA: Diagnosis present

## 2022-08-06 DIAGNOSIS — Z888 Allergy status to other drugs, medicaments and biological substances status: Secondary | ICD-10-CM

## 2022-08-06 HISTORY — DX: Unspecified convulsions: R56.9

## 2022-08-06 HISTORY — DX: Depression, unspecified: F32.A

## 2022-08-06 HISTORY — DX: Central cord syndrome at C4 level of cervical spinal cord, initial encounter: S14.124A

## 2022-08-06 LAB — CBC WITH DIFFERENTIAL/PLATELET
Abs Immature Granulocytes: 0.12 10*3/uL — ABNORMAL HIGH (ref 0.00–0.07)
Basophils Absolute: 0.1 10*3/uL (ref 0.0–0.1)
Basophils Relative: 0 %
Eosinophils Absolute: 0.1 10*3/uL (ref 0.0–0.5)
Eosinophils Relative: 1 %
HCT: 47.7 % — ABNORMAL HIGH (ref 36.0–46.0)
Hemoglobin: 15.3 g/dL — ABNORMAL HIGH (ref 12.0–15.0)
Immature Granulocytes: 1 %
Lymphocytes Relative: 12 %
Lymphs Abs: 1.7 10*3/uL (ref 0.7–4.0)
MCH: 29.1 pg (ref 26.0–34.0)
MCHC: 32.1 g/dL (ref 30.0–36.0)
MCV: 90.9 fL (ref 80.0–100.0)
Monocytes Absolute: 0.9 10*3/uL (ref 0.1–1.0)
Monocytes Relative: 6 %
Neutro Abs: 11.6 10*3/uL — ABNORMAL HIGH (ref 1.7–7.7)
Neutrophils Relative %: 80 %
Platelets: 346 10*3/uL (ref 150–400)
RBC: 5.25 MIL/uL — ABNORMAL HIGH (ref 3.87–5.11)
RDW: 16.4 % — ABNORMAL HIGH (ref 11.5–15.5)
WBC: 14.5 10*3/uL — ABNORMAL HIGH (ref 4.0–10.5)
nRBC: 0 % (ref 0.0–0.2)

## 2022-08-06 LAB — COMPREHENSIVE METABOLIC PANEL
ALT: 14 U/L (ref 0–44)
AST: 20 U/L (ref 15–41)
Albumin: 3.1 g/dL — ABNORMAL LOW (ref 3.5–5.0)
Alkaline Phosphatase: 140 U/L — ABNORMAL HIGH (ref 38–126)
Anion gap: 13 (ref 5–15)
BUN: 11 mg/dL (ref 8–23)
CO2: 18 mmol/L — ABNORMAL LOW (ref 22–32)
Calcium: 9.3 mg/dL (ref 8.9–10.3)
Chloride: 104 mmol/L (ref 98–111)
Creatinine, Ser: 0.85 mg/dL (ref 0.44–1.00)
GFR, Estimated: >60 mL/min (ref >60)
Glucose, Bld: 101 mg/dL — ABNORMAL HIGH (ref 70–99)
Potassium: 3.7 mmol/L (ref 3.5–5.1)
Sodium: 135 mmol/L (ref 135–145)
Total Bilirubin: 1 mg/dL (ref 0.3–1.2)
Total Protein: 7.8 g/dL (ref 6.5–8.1)

## 2022-08-06 LAB — LACTIC ACID, PLASMA: Lactic Acid, Venous: 1.7 mmol/L (ref 0.5–1.9)

## 2022-08-06 MED ORDER — ACETAMINOPHEN 325 MG PO TABS: 650.0000 mg | ORAL_TABLET | Freq: Four times a day (QID) | ORAL | Status: DC | PRN

## 2022-08-06 MED ORDER — ENOXAPARIN SODIUM 60 MG/0.6ML IJ SOSY
0.5000 mg/kg | PREFILLED_SYRINGE | INTRAMUSCULAR | Status: DC
  Administered 2022-08-07 – 2022-08-08 (×2): 57.5 mg via SUBCUTANEOUS
  Filled 2022-08-06 (×2): qty 0.6

## 2022-08-06 MED ORDER — SENNOSIDES-DOCUSATE SODIUM 8.6-50 MG PO TABS
2.0000 | ORAL_TABLET | Freq: Every day | ORAL | Status: DC
  Administered 2022-08-06 – 2022-08-07 (×2): 2 via ORAL
  Filled 2022-08-06 (×2): qty 2

## 2022-08-06 MED ORDER — CIPROFLOXACIN IN D5W 400 MG/200ML IV SOLN
400.0000 mg | Freq: Once | INTRAVENOUS | Status: AC
  Administered 2022-08-06: 400 mg via INTRAVENOUS
  Filled 2022-08-06: qty 200

## 2022-08-06 MED ORDER — ACETAMINOPHEN 650 MG RE SUPP: 650.0000 mg | Freq: Four times a day (QID) | RECTAL | Status: DC | PRN

## 2022-08-06 MED ORDER — ONDANSETRON HCL 4 MG/2ML IJ SOLN
4.0000 mg | Freq: Four times a day (QID) | INTRAMUSCULAR | Status: DC | PRN
  Administered 2022-08-06: 4 mg via INTRAVENOUS
  Filled 2022-08-06: qty 2

## 2022-08-06 MED ORDER — VANCOMYCIN HCL 1500 MG/300ML IV SOLN
1500.0000 mg | Freq: Once | INTRAVENOUS | Status: DC
  Filled 2022-08-06: qty 300

## 2022-08-06 MED ORDER — TRIMETHOPRIM 100 MG PO TABS
100.0000 mg | ORAL_TABLET | Freq: Every day | ORAL | Status: DC
  Administered 2022-08-07 – 2022-08-08 (×2): 100 mg via ORAL
  Filled 2022-08-06 (×2): qty 1

## 2022-08-06 MED ORDER — ONDANSETRON HCL 4 MG PO TABS: 4.0000 mg | ORAL_TABLET | Freq: Four times a day (QID) | ORAL | Status: DC | PRN

## 2022-08-06 MED ORDER — VANCOMYCIN HCL IN DEXTROSE 1-5 GM/200ML-% IV SOLN: 1000.0000 mg | Freq: Once | INTRAVENOUS | Status: DC

## 2022-08-06 MED ORDER — LACTATED RINGERS IV BOLUS
1000.0000 mL | Freq: Once | INTRAVENOUS | Status: AC
  Administered 2022-08-06: 1000 mL via INTRAVENOUS

## 2022-08-06 MED ORDER — LEVETIRACETAM 500 MG PO TABS
500.0000 mg | ORAL_TABLET | Freq: Two times a day (BID) | ORAL | Status: DC
  Administered 2022-08-06 – 2022-08-08 (×4): 500 mg via ORAL
  Filled 2022-08-06 (×4): qty 1

## 2022-08-06 MED ORDER — TRAMADOL HCL 50 MG PO TABS: 50.0000 mg | ORAL_TABLET | Freq: Four times a day (QID) | ORAL | Status: DC | PRN

## 2022-08-06 MED ORDER — BACLOFEN 10 MG PO TABS
5.0000 mg | ORAL_TABLET | Freq: Two times a day (BID) | ORAL | Status: DC
  Administered 2022-08-06 – 2022-08-08 (×4): 5 mg via ORAL
  Filled 2022-08-06 (×4): qty 1

## 2022-08-06 MED ORDER — VANCOMYCIN HCL 2000 MG/400ML IV SOLN
2000.0000 mg | INTRAVENOUS | Status: DC
  Administered 2022-08-07: 2000 mg via INTRAVENOUS
  Filled 2022-08-06 (×2): qty 400

## 2022-08-06 MED ORDER — IOHEXOL 300 MG/ML  SOLN
100.0000 mL | Freq: Once | INTRAMUSCULAR | Status: AC | PRN
  Administered 2022-08-06: 75 mL via INTRAVENOUS

## 2022-08-06 MED ORDER — DULOXETINE HCL 30 MG PO CPEP
30.0000 mg | ORAL_CAPSULE | Freq: Every day | ORAL | Status: DC
  Filled 2022-08-06: qty 1

## 2022-08-06 MED ORDER — VANCOMYCIN HCL 2000 MG/400ML IV SOLN
2000.0000 mg | Freq: Once | INTRAVENOUS | Status: AC
  Administered 2022-08-06: 2000 mg via INTRAVENOUS
  Filled 2022-08-06: qty 400

## 2022-08-06 NOTE — ED Triage Notes (Signed)
Sent to ED from [facility].  Patient has been treated for preseptal cellulitis with azithromycin (started last Friday) yesterday she started to feel bad, weak and nauseated.  Patient states right eye has not improved. Right eyelid reddened and swollen.

## 2022-08-06 NOTE — Progress Notes (Signed)
Pharmacy Antibiotic Note  Yvette Phillips is a 62 y.o. female admitted on 08/06/2022 with right preseptal cellulitis with suspected postseptal involvement (eye swelling).  Pharmacy has been consulted for Vancomycin dosing.  -hx central cord syndrome at C4-ambulates w/ walker, seizure disorder,recurrent UTI on trimethoprim PTA   Plan: Will order Vancomycin 2000 mg IV x 1 for loading dose. Vancomycin 2000 mg IV Q 24 hrs. Goal AUC 400-550. Expected AUC: 497  SCr used: 0.85 Cmin 10.6   BMI 46  Vd=0.5      Height: '5\' 7"'$  (170.2 cm) Weight: 114.9 kg (253 lb 4.9 oz) IBW/kg (Calculated) : 61.6  Temp (24hrs), Avg:98.2 F (36.8 C), Min:98.1 F (36.7 C), Max:98.3 F (36.8 C)  Recent Labs  Lab 08/06/22 1212  WBC 14.5*  CREATININE 0.85  LATICACIDVEN 1.7    Estimated Creatinine Clearance: 88.7 mL/min (by C-G formula based on SCr of 0.85 mg/dL).    Allergies  Allergen Reactions   Codeine Anaphylaxis   Penicillins Anaphylaxis   Dantrolene Other (See Comments)    Pt had confusion, dizziness and "off" with 50 mg Dantrolene   Lyrica [Pregabalin] Other (See Comments)    Edema   Penicillin G     Antimicrobials this admission: vancomycin 1/13 >>   Cipro x 1 3/13   Dose adjustments this admission:    Microbiology results:   BCx:     UCx:      Sputum:      MRSA PCR:    Thank you for allowing pharmacy to be a part of this patient's care.  Magali Bray A 08/06/2022 7:48 PM

## 2022-08-06 NOTE — ED Provider Notes (Signed)
[facility] Provider Note    Event Date/Time   First MD Initiated Contact with Patient 08/06/22 1503     (approximate)   History   Eye Problem   HPI  [place] Whelan is a 62 y.o. female  who presents to the emergency department today because of concern for continued redness and swelling around her right eye. The patient states that this started roughly 6 days ago. She went to the eye doctor and was started on antibiotics. Does feel the swelling has gotten better. Says that she came in today because it has not completely gotten better. She has not followed up with the eye doctor. Denies any change in vision or blurry vision. She denies any fevers. Has had a little nausea.     Physical Exam   Triage Vital Signs: ED Triage Vitals  Enc Vitals Group     BP 08/06/22 1207 (!) 140/106     Pulse Rate 08/06/22 1207 (!) 118     Resp 08/06/22 1207 16     Temp 08/06/22 1207 98.1 F (36.7 C)     Temp Source 08/06/22 1207 Oral     SpO2 08/06/22 1207 95 %     Weight 08/06/22 1206 (!) 300 lb 7.8 oz (136.3 kg)     Height 08/06/22 1206 '5\' 7"'$  (1.702 m)     Head Circumference --      Peak Flow --      Pain Score 08/06/22 1206 2   Most recent vital signs: Vitals:   08/06/22 1207 08/06/22 1440  BP: (!) 140/106   Pulse: (!) 118 (!) 107  Resp: 16   Temp: 98.1 F (36.7 C)   SpO2: 95% 96%   General: Awake, alert, oriented. CV:  Good peripheral perfusion. Tachycardia. Resp:  Normal effort. Lungs clear. Abd:  No distention.    ED Results / Procedures / Treatments   Labs (all labs ordered are listed, but only abnormal results are displayed) Labs Reviewed  COMPREHENSIVE METABOLIC PANEL - Abnormal; Notable for the following components:      Result Value   CO2 18 (*)    Glucose, Bld 101 (*)    Albumin 3.1 (*)    Alkaline Phosphatase 140 (*)    All other components within normal limits  CBC WITH DIFFERENTIAL/PLATELET - Abnormal; Notable for the following  components:   WBC 14.5 (*)    RBC 5.25 (*)    Hemoglobin 15.3 (*)    HCT 47.7 (*)    RDW 16.4 (*)    Neutro Abs 11.6 (*)    Abs Immature Granulocytes 0.12 (*)    All other components within normal limits  LACTIC ACID, PLASMA  LACTIC ACID, PLASMA  URINALYSIS, ROUTINE W REFLEX MICROSCOPIC     EKG  None   RADIOLOGY  I independently interpreted and visualized the CT orbits. My interpretation: Right periorbital cellulitis Radiology interpretation:  IMPRESSION:  Right preseptal/periorbital edema, compatible with cellulitis. The  right lacrimal gland is contiguous with the changes and is also  asymmetrically enlarged and edematous suggesting involvement. In  this region there may be slight postseptal extension of infectious  findings. No discrete, drainable fluid collection.      PROCEDURES:  Critical Care performed: Yes  CRITICAL CARE Performed by: Nance Pear   Total critical care time: 30 minutes  Critical care time was exclusive of separately billable procedures and treating other patients.  Critical care was necessary to treat or prevent imminent or life-threatening deterioration.  Critical care was time spent personally by me on the following activities: development of treatment plan with patient and/or surrogate as well as nursing, discussions with consultants, evaluation of patient's response to treatment, examination of patient, obtaining history from patient or surrogate, ordering and performing treatments and interventions, ordering and review of laboratory studies, ordering and review of radiographic studies, pulse oximetry and re-evaluation of patient's condition.     MEDICATIONS ORDERED IN ED: Medications - No data to display   IMPRESSION / MDM / [place] / ED COURSE  I reviewed the triage vital signs and the nursing notes.                              Differential diagnosis includes, but is not limited to, preseptal cellulitis,  postseptal cellulitis, allergic reaction  Patient's presentation is most consistent with acute presentation with potential threat to life or bodily function.  Patient presented to the emergency department today because of concerns for continued redness and swelling to her right eye.  Patient states she has been on oral antibiotics for almost 1 week.  On exam patient without any obvious proptosis.  Does not have any limited range of motion of the eye.  However patient was slightly tachycardic here.  Additionally blood work shows a leukocytosis.  Given leukocytosis did have concerns for possible postseptal cellulitis.  Did obtain a CT scan which did raise some concerns for possible postseptal extension.  Given concern for postseptal cellulitis we will start multiple broad-spectrum antibiotics. Discussed with Dr. Edison Pace with ophthalmology over the telephone who recommended IV abx but no other specific intervention at this time. Discussed with Dr. Mal Misty with the hospitalist service who will plan on admission.  FINAL CLINICAL IMPRESSION(S) / ED DIAGNOSES   Final diagnoses:  Cellulitis of right orbital region    Note:  This document was prepared using Dragon voice recognition software and may include unintentional dictation errors.    Nance Pear, MD 08/06/22 [id number]

## 2022-08-06 NOTE — H&P (Addendum)
History and Physical:    Yvette Phillips   [id number] DOB: [birth year] DOA: 08/06/2022  Referring MD/provider: Nance Pear, MD PCP: Eddie North   Patient coming from: Home  Chief Complaint: Redness and swelling around her right eye  History of Present Illness:   Yvette Phillips is a 62 y.o. 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  She saw an ophthalmologist at [facility] on 08/01/2022.  She was given azithromycin for infection around the right eye.  She states she has noticed some improvement in the swelling around the right eye but it has not completely resolved.  She said she had pressure behind the right eye prior to starting the antibiotics and she thinks that the pressure behind the right eye has improved.  She has no pain in the right eye.  She has increased watery discharge from the right eye.  No  abnormal vision or change in vision, fever, chills, headache, dizziness, chest pain, shortness of breath, vomiting, diarrhea, abdominal pain.  She said she was initially wheelchair-bound but she has been able to ambulate with a walker for the past 2 months.  Patient gave me some documents from [facility] that contained her medical history and medication list.  These documents were personally reviewed by me.  ED Course:  The patient was given IV ciprofloxacin in the emergency department.  ROS:   ROS all other systems reviewed were negative  Past Medical History:   Past Medical History:  Diagnosis Date   Central cord syndrome at C4 level of cervical spinal cord (HCC)    Depression    Seizures ([place])     Past Surgical History:   Past Surgical History:  Procedure Laterality Date   ANTERIOR CERVICAL DECOMP/DISCECTOMY FUSION N/A  07/04/2020   Procedure: ANTERIOR CERVICAL DECOMPRESSION/DISCECTOMY FUSION 1 LEVEL;  Surgeon: Meade Maw, MD;  Location: [facility] ORS;  Service: Neurosurgery;  Laterality: N/A;    Social History:   Social History   Socioeconomic History   Marital status: Single    Spouse name: Not on file   Number of children: Not on file   Years of education: Not on file   Highest education level: Not on file  Occupational History   Not on file  Tobacco Use   Smoking status: Former    Packs/day: 0.50    Years: 15.00    Total pack years: 7.50    Types: Cigarettes   Smokeless tobacco: Never  Vaping Use   Vaping Use: Never used  Substance and Sexual Activity   Alcohol use: Not Currently    Comment: occassionally   Drug use: Not Currently   Sexual activity: Not Currently  Other Topics Concern   Not on file  Social History Narrative   Not on file   Social Determinants of Health   Financial Resource Strain: Not on file  Food Insecurity: Not on file  Transportation Needs: Not on file  Physical Activity: Not on file  Stress: Not on file  Social Connections: Not on file  Intimate Partner Violence: Not on file    Allergies   Codeine, Penicillins, Dantrolene, Lyrica [pregabalin], and Penicillin g  Family history:   Family History  Problem Relation Age of Onset   Diabetes Mother    Diabetes Father    Heart disease Neg  Hx     Current Medications:   Prior to Admission medications   Medication Sig Start Date End Date Taking? Authorizing Provider  acetaminophen (TYLENOL) 325 MG tablet Take 1-2 tablets (325-650 mg total) by mouth every 4 (four) hours as needed for mild pain. 07/16/20   Love, Ivan Anchors, PA-C  baclofen (LIORESAL) 10 MG tablet Take by mouth.    [provider]  bisacodyl (DULCOLAX) 10 MG suppository Place 10 mg rectally as needed for moderate constipation. Patient not taking: Reported on 03/21/2022    [provider]  diclofenac Sodium (VOLTAREN) 1 %  GEL Apply 2 g topically daily.    [provider]  DULoxetine (CYMBALTA) 30 MG capsule Take by mouth.    [provider]  famotidine (PEPCID) 20 MG tablet Take 1 tablet (20 mg total) by mouth at bedtime. Patient not taking: Reported on 03/21/2022 07/11/20   Nolberto Hanlon, MD  fesoterodine (TOVIAZ) 4 MG TB24 tablet Take by mouth.    [provider]  folic acid (FOLVITE) 1 MG tablet Take by mouth. Patient not taking: Reported on 03/21/2022    [provider]  levETIRAcetam (KEPPRA) 500 MG tablet Take by mouth.    [provider]  lidocaine (LIDODERM) 5 % Place 1 patch onto the skin daily. Remove & Discard patch within 12 hours or as directed by MD Patient not taking: Reported on 03/21/2022    [provider]  Multiple Vitamin (MULTIVITAMIN WITH MINERALS) TABS tablet Take 1 tablet by mouth daily. Patient not taking: Reported on 03/21/2022 07/12/20   Nolberto Hanlon, MD  Nystatin (GERHARDT'S BUTT CREAM) CREA Apply 1 application topically 3 (three) times daily. Patient not taking: Reported on 03/21/2022 08/21/20   Love, Ivan Anchors, PA-C  polyethylene glycol (MIRALAX / GLYCOLAX) 17 g packet Take 17 g by mouth 2 (two) times daily. 08/21/20   Love, Ivan Anchors, PA-C  senna-docusate (SENOKOT-S) 8.6-50 MG tablet Take 2 tablets by mouth at bedtime. 08/21/20   Love, Ivan Anchors, PA-C  thiamine 100 MG tablet Take 1 tablet (100 mg total) by mouth daily. Patient not taking: Reported on 03/21/2022 07/12/20   Nolberto Hanlon, MD  tiZANidine (ZANAFLEX) 4 MG tablet Take by mouth.    [provider]  traMADol (ULTRAM) 50 MG tablet Take 50 mg by mouth every 6 (six) hours as needed. 11/23/20   [provider]  trimethoprim (TRIMPEX) 100 MG tablet Take 1 tablet (100 mg total) by mouth daily. 02/11/21   Bjorn Loser, MD  vitamin B-12 2000 MCG tablet Take 1 tablet (2,000 mcg total) by mouth daily. 08/21/20   Love, Ivan Anchors, PA-C  Vitamin D, Ergocalciferol, (DRISDOL)  1.25 MG (50000 UNIT) CAPS capsule Take 50,000 Units by mouth every 7 (seven) days.    [provider]  white petrolatum (VASELINE) OINT Apply 1 application topically 2 (two) times daily. 08/21/20   Bary Leriche, PA-C    Physical Exam:   Vitals:   08/06/22 1541 08/06/22 1600 08/06/22 1700 08/06/22 1710  BP: (!) 141/80 (!) 140/81 (!) 152/83   Pulse: (!) 106  (!) 111   Resp: 18  18   Temp:    98.2 F (36.8 C)  TempSrc:    Oral  SpO2: 97%  98%   Weight:      Height:         Physical Exam: Blood pressure (!) 152/83, pulse (!) 111, temperature 98.2 F (36.8 C), temperature source Oral, resp. rate 18, height '5\' 7"'$  (  1.702 m), weight (!) 136.3 kg, SpO2 98 %. Gen: No acute distress. Head: Normocephalic, atraumatic. Eyes: Right periorbital swelling and erythema, some redness of the right eye, pupils equal, round and reactive to light. Extraocular movements intact.  Sclerae nonicteric.  Mouth: Moist mucous membranes Neck: Supple, no thyromegaly, no lymphadenopathy, no jugular venous distention. Chest: Lungs are clear to auscultation with good air movement. No rales, rhonchi or wheezes.  CV: Heart sounds are regular with an S1, S2. No murmurs, rubs or gallops.  Abdomen: Soft, nontender, obese with normal active bowel sounds. No palpable masses. Extremities: Extremities are without clubbing, or cyanosis. No edema. Pedal pulses 2+.  Skin: Warm and dry. No rashes, lesions or wounds Neuro: Alert and oriented times 3; grossly nonfocal.  Psych: Insight is good and judgment is appropriate. Mood and affect normal.   Data Review:    Labs: Basic Metabolic Panel: Recent Labs  Lab 08/06/22 1212  NA 135  K 3.7  CL 104  CO2 18*  GLUCOSE 101*  BUN 11  CREATININE 0.85  CALCIUM 9.3   Liver Function Tests: Recent Labs  Lab 08/06/22 1212  AST 20  ALT 14  ALKPHOS 140*  BILITOT 1.0  PROT 7.8  ALBUMIN 3.1*   No results for input(s): "LIPASE", "AMYLASE" in the last 168  hours. No results for input(s): "AMMONIA" in the last 168 hours. CBC: Recent Labs  Lab 08/06/22 1212  WBC 14.5*  NEUTROABS 11.6*  HGB 15.3*  HCT 47.7*  MCV 90.9  PLT 346   Cardiac Enzymes: No results for input(s): "CKTOTAL", "CKMB", "CKMBINDEX", "TROPONINI" in the last 168 hours.  BNP (last 3 results) No results for input(s): "PROBNP" in the last 8760 hours. CBG: No results for input(s): "GLUCAP" in the last 168 hours.  Urinalysis    Component Value Date/Time   COLORURINE YELLOW 08/08/2020 1122   APPEARANCEUR Cloudy (A) 12/17/2020 1439   LABSPEC >1.030 (H) 08/08/2020 1122   PHURINE 5.5 08/08/2020 1122   GLUCOSEU Negative 12/17/2020 1439   HGBUR TRACE (A) 08/08/2020 1122   BILIRUBINUR Negative 12/17/2020 1439   KETONESUR NEGATIVE 08/08/2020 1122   PROTEINUR Negative 12/17/2020 1439   PROTEINUR NEGATIVE 08/08/2020 1122   NITRITE Positive (A) 12/17/2020 1439   NITRITE POSITIVE (A) 08/08/2020 1122   LEUKOCYTESUR 1+ (A) 12/17/2020 1439   LEUKOCYTESUR SMALL (A) 08/08/2020 1122      Radiographic Studies: CT Orbits W Contrast  Result Date: 08/06/2022 CLINICAL DATA:  right eye swelling EXAM: CT ORBITS WITH CONTRAST TECHNIQUE: Multidetector CT images was performed according to the standard protocol following intravenous contrast administration. RADIATION DOSE REDUCTION: This exam was performed according to the departmental dose-optimization program which includes automated exposure control, adjustment of the mA and/or kV according to patient size and/or use of iterative reconstruction technique. CONTRAST:  5m OMNIPAQUE IOHEXOL 300 MG/ML  SOLN COMPARISON:  None Available. FINDINGS: Orbits: Right preseptal/periorbital edema. The right lacrimal gland is contiguous with the changes and is also asymmetrically enlarged and edematous suggesting involvement. In this region there may be slight postseptal extension of infectious findings. No discrete, drainable fluid collection. The  globes, extraocular muscles and optic nerves are within normal limits and symmetric. Visible paranasal sinuses: Mild mucosal thickening. No air-fluid levels. Soft tissues: Right orbital findings are described above. Otherwise, unremarkable soft tissues. Osseous: No fracture or aggressive lesion. Limited intracranial: No acute or significant finding. IMPRESSION: Right preseptal/periorbital edema, compatible with cellulitis. The right lacrimal gland is contiguous with the changes and is also asymmetrically enlarged  and edematous suggesting involvement. In this region there may be slight postseptal extension of infectious findings. No discrete, drainable fluid collection. Electronically Signed   By: Margaretha Sheffield M.D.   On: 08/06/2022 16:25    EKG: EKG was not done   Assessment/Plan:   Principal Problem:   Preseptal cellulitis of right eye    Body mass index is 47.06 kg/m.  (Morbid obesity)    Right eye preseptal cellulitis, suspected postseptal extension, leukocytosis: Admit to MedSurg.  Treat with IV vancomycin and ciprofloxacin.  Analgesics as needed for pain.  Dr. Archie Balboa, ED physician, said he had spoken to Dr. Edison Pace, ophthalmologist, who said there was no specific intervention besides IV antibiotics at this time.  I personally consulted Dr. Edison Pace, ophthalmologist, for further evaluation.  He was notified via secure chat.   Seizure disorder: Continue Keppra   Depression: Continue Cymbalta   History of central cord syndrome at C4: She said she is able to ambulate with a walker.  Fall precautions.    Other information:   DVT prophylaxis: Lovenox  Code Status: Full code. Family Communication: None  Disposition Plan: 2 to 3 days Consults called: Ophthalmologist Admission status: Inpatient   The medical decision making is of moderate complexity, therefore this is a level 2 visit.    Merridith Dershem [facility] Pager: Please check [email or web address]   How to contact  the [facility] Attending or Consulting provider [place] or covering provider during after hours [place], for this patient?   Check the care team in [facility] and look for a) attending/consulting [facility] provider listed and b) the [facility] team listed Log into [email or web address] and use [place]'s universal password to access. If you do not have the password, please contact the hospital operator. Locate the [facility] provider you are looking for under [facility] and page to a number that you can be directly reached. If you still have difficulty reaching the provider, please page the [facility] (Director on Call) for the Hospitalists listed on amion for assistance.  08/06/2022, 5:59 PM

## 2022-08-06 NOTE — Progress Notes (Signed)
   08/06/22 1928  Assess: MEWS Score  Temp 98.3 F (36.8 C)  BP (!) 136/99  MAP (mmHg) 107  Pulse Rate (!) 113  Resp 15  Level of Consciousness Alert  SpO2 99 %  O2 Device Room Air  Assess: MEWS Score  MEWS Temp 0  MEWS Systolic 0  MEWS Pulse 2  MEWS RR 0  MEWS LOC 0  MEWS Score 2  MEWS Score Color Yellow  Assess: if the MEWS score is Yellow or Red  Were vital signs taken at a resting state? No  Focused Assessment No change from prior assessment  Does the patient meet 2 or more of the SIRS criteria? Yes  Does the patient have a confirmed or suspected source of infection? Yes  Provider and Rapid Response Notified? No  MEWS guidelines implemented  Yes, yellow  Treat  MEWS Interventions Considered administering scheduled or prn medications/treatments as ordered  Take Vital Signs  Increase Vital Sign Frequency  Yellow: Q2hr x1, continue Q4hrs until patient remains green for 12hrs  Escalate  MEWS: Escalate Yellow: Discuss with charge nurse and consider notifying provider and/or RRT  Notify: Charge Nurse/RN  Name of Charge Nurse/RN Notified Jackie, RN  Assess: SIRS CRITERIA  SIRS Temperature  0  SIRS Pulse 1  SIRS Respirations  0  SIRS WBC 1  SIRS Score Sum  2

## 2022-08-07 LAB — CBC WITH DIFFERENTIAL/PLATELET
Abs Immature Granulocytes: 0.11 10*3/uL — ABNORMAL HIGH (ref 0.00–0.07)
Basophils Absolute: 0.1 10*3/uL (ref 0.0–0.1)
Basophils Relative: 0 %
Eosinophils Absolute: 0.2 10*3/uL (ref 0.0–0.5)
Eosinophils Relative: 1 %
HCT: 41.6 % (ref 36.0–46.0)
Hemoglobin: 13 g/dL (ref 12.0–15.0)
Immature Granulocytes: 1 %
Lymphocytes Relative: 12 %
Lymphs Abs: 2 10*3/uL (ref 0.7–4.0)
MCH: 28.8 pg (ref 26.0–34.0)
MCHC: 31.3 g/dL (ref 30.0–36.0)
MCV: 92 fL (ref 80.0–100.0)
Monocytes Absolute: 1 10*3/uL (ref 0.1–1.0)
Monocytes Relative: 6 %
Neutro Abs: 13.6 10*3/uL — ABNORMAL HIGH (ref 1.7–7.7)
Neutrophils Relative %: 80 %
Platelets: 297 10*3/uL (ref 150–400)
RBC: 4.52 MIL/uL (ref 3.87–5.11)
RDW: 16.4 % — ABNORMAL HIGH (ref 11.5–15.5)
WBC: 17 10*3/uL — ABNORMAL HIGH (ref 4.0–10.5)
nRBC: 0 % (ref 0.0–0.2)

## 2022-08-07 LAB — HIV ANTIBODY (ROUTINE TESTING W REFLEX): HIV Screen 4th Generation wRfx: NONREACTIVE

## 2022-08-07 LAB — CREATININE, SERUM
Creatinine, Ser: 0.75 mg/dL (ref 0.44–1.00)
GFR, Estimated: >60 mL/min (ref >60)

## 2022-08-07 MED ORDER — PANTOPRAZOLE SODIUM 20 MG PO TBEC
20.0000 mg | DELAYED_RELEASE_TABLET | Freq: Two times a day (BID) | ORAL | Status: DC
  Administered 2022-08-07 – 2022-08-08 (×3): 20 mg via ORAL
  Filled 2022-08-07 (×3): qty 1

## 2022-08-07 MED ORDER — DULOXETINE HCL 30 MG PO CPEP
90.0000 mg | ORAL_CAPSULE | Freq: Every day | ORAL | Status: DC
  Administered 2022-08-07: 90 mg via ORAL
  Filled 2022-08-07: qty 3

## 2022-08-07 MED ORDER — FESOTERODINE FUMARATE ER 4 MG PO TB24
4.0000 mg | ORAL_TABLET | Freq: Every day | ORAL | Status: DC
  Administered 2022-08-07 – 2022-08-08 (×2): 4 mg via ORAL
  Filled 2022-08-07 (×2): qty 1

## 2022-08-07 MED ORDER — CIPROFLOXACIN IN D5W 400 MG/200ML IV SOLN
400.0000 mg | Freq: Two times a day (BID) | INTRAVENOUS | Status: DC
  Administered 2022-08-07 – 2022-08-08 (×3): 400 mg via INTRAVENOUS
  Filled 2022-08-07 (×3): qty 200

## 2022-08-07 NOTE — Consult Note (Signed)
Reason for Consult:orbital cellulitis, right eye Referring Physician: [facility] Hospitalist Jennye Boroughs, MD. Chief complaint: swelling, pain right eye.  HPI: Yvette Phillips is an 62 y.o. female with a recent history of cellulitis of the right eye.  She was seen for this as an outpatient at [facility] 3/8 and prescribed azithromycin.  Despite taking the oral antibiotics, the swelling of the right eye worsened, so Yvette Phillips sought care from her PCP, who advised her to go the [facility] ED.  A CT scan showed postseptal stranding so she was admitted for IV antibiotics.  Since starting the IV antibiotics yesterday, she reports some improvement in the swelling and the pain of the right eye.  She does report some vomiting and diarrhea since starting the IV Abx.  The vision remains unchanged.  She denies double vision or other changes.   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 Her smoking use included cigarettes. She has a 7.50 pack-year smoking history. She has never used smokeless tobacco. She reports that she does not currently use alcohol. She reports that she does not currently use drugs.  Allergies:  Allergies  Allergen Reactions   Codeine Anaphylaxis   Penicillins Anaphylaxis   Dantrolene Other (See Comments)    Pt had confusion, dizziness and "off" with 50 mg Dantrolene   Lyrica [Pregabalin] Other (See Comments)    Edema   Penicillin G      Prior to Admission medications   Medication Sig Start Date End Date Taking? Authorizing Provider  Acetaminophen Extra Strength 500 MG TABS Take 1 tablet by mouth 3 (three) times daily as needed (for increased pain). 06/25/22  Yes [provider]  azithromycin (ZITHROMAX) 500 MG tablet Take 500 mg by mouth daily. For 7 days 08/01/22  Yes [provider]  Baclofen 5 MG TABS Take 5 mg by mouth 2 (two) times daily.   Yes [provider]  DULoxetine (CYMBALTA) 30 MG capsule Take 90 mg by mouth at bedtime.   Yes [provider]  fesoterodine (TOVIAZ) 4 MG TB24 tablet Take 4 mg by mouth daily.   Yes [provider]  furosemide (LASIX) 20 MG tablet Take 10-20 mg by mouth daily. Takes 1/2 tablet on Mondays,Wednesdays, Fridays. And Sundays and 1 tablet on Tuesday, Thursday and Saturday 05/28/22  Yes [provider]  levETIRAcetam (KEPPRA) 500 MG tablet Take 500 mg by mouth 2 (two) times daily.   Yes [provider]  meloxicam (MOBIC) 7.5 MG tablet Take 7.5 mg by mouth daily. 06/25/22  Yes [provider]  polyethylene glycol (MIRALAX / GLYCOLAX) 17 g packet Take 17 g by mouth 2 (two) times daily. Patient taking differently: Take 17 g by mouth daily. 08/21/20  Yes Love, Ivan Anchors, PA-C  potassium chloride (KLOR-CON) 10 MEQ tablet Take 10 mEq by mouth daily. 05/28/22  Yes [provider]  PROTONIX 20  MG tablet Take 20 mg by mouth 2 (two) times daily. 06/25/22  Yes [provider]  senna-docusate (SENOKOT-S) 8.6-50 MG tablet Take 2 tablets by mouth at bedtime. 08/21/20  Yes Love, Ivan Anchors, PA-C  tiZANidine (ZANAFLEX) 4 MG tablet Take 4 mg by mouth in the morning and at bedtime.   Yes [provider]  traMADol (ULTRAM) 50 MG tablet Take 50 mg by mouth every 8 (eight) hours as needed for moderate pain. 11/23/20  Yes [provider]  trimethoprim (TRIMPEX) 100 MG tablet Take 1 tablet (100 mg total) by mouth daily.  02/11/21  Yes MacDiarmid, Nicki Reaper, MD  trolamine salicylate (ASPERCREME) 10 % cream Apply 1 Application topically 3 (three) times daily as needed for muscle pain.   Yes [provider]  TUMS 500 MG chewable tablet Chew 2 tablets by mouth 4 (four) times daily as needed for indigestion or heartburn. 06/06/22  Yes [provider]  VITAMIN D, ERGOCALCIFEROL, PO Take 1,000 Units by mouth every 7 (seven) days.   Yes [provider]  bisacodyl (DULCOLAX) 10 MG suppository Place 10 mg rectally as needed for moderate constipation. Patient not taking: Reported on 03/21/2022    [provider]  diclofenac Sodium (VOLTAREN) 1 % GEL Apply 2 g topically daily. Patient not taking: Reported on 08/06/2022    [provider]  famotidine (PEPCID) 20 MG tablet Take 1 tablet (20 mg total) by mouth at bedtime. Patient not taking: Reported on 03/21/2022 07/11/20   Nolberto Hanlon, MD  folic acid (FOLVITE) 1 MG tablet Take by mouth. Patient not taking: Reported on 03/21/2022    [provider]  lidocaine (LIDODERM) 5 % Place 1 patch onto the skin daily. Remove & Discard patch within 12 hours or as directed by MD Patient not taking: Reported on 03/21/2022    [provider]  Multiple Vitamin (MULTIVITAMIN WITH MINERALS) TABS tablet Take 1 tablet by mouth daily. Patient not taking: Reported on 03/21/2022 07/12/20   Nolberto Hanlon, MD  Nystatin (GERHARDT'S BUTT CREAM) CREA Apply 1 application topically 3 (three) times daily. Patient not taking: Reported on 03/21/2022 08/21/20   Love, Ivan Anchors, PA-C  thiamine 100 MG tablet Take 1 tablet (100 mg total) by mouth daily. Patient not taking: Reported on 03/21/2022 07/12/20   Nolberto Hanlon, MD  vitamin B-12 2000 MCG tablet Take 1 tablet (2,000 mcg total) by mouth daily. Patient not taking: Reported on 08/06/2022 08/21/20   Love, Ivan Anchors, PA-C  white petrolatum (VASELINE) OINT Apply 1 application topically 2 (two) times  daily. Patient not taking: Reported on 08/06/2022 08/21/20   Bary Leriche, PA-C    Results for orders placed or performed during the hospital encounter of 08/06/22 (from the past 48 hour(s))  Lactic acid, plasma     Status: None   Collection Time: 08/06/22 12:12 PM  Result Value Ref Range   Lactic Acid, Venous 1.7 0.5 - 1.9 mmol/L    Comment: Performed at [facility], [address] [place], [place] [address]  Comprehensive metabolic panel     Status: Abnormal   Collection Time: 08/06/22 12:12 PM  Result Value Ref Range   Sodium 135 135 - 145 mmol/L   Potassium 3.7 3.5 - 5.1 mmol/L   Chloride 104 98 - 111 mmol/L   CO2 18 (L) 22 - 32 mmol/L   Glucose, Bld 101 (H) 70 - 99 mg/dL    Comment: Glucose reference range applies only to samples taken after fasting for  at least 8 hours.   BUN 11 8 - 23 mg/dL   Creatinine, Ser 0.85 0.44 - 1.00 mg/dL   Calcium 9.3 8.9 - 10.3 mg/dL   Total Protein 7.8 6.5 - 8.1 g/dL   Albumin 3.1 (L) 3.5 - 5.0 g/dL   AST 20 15 - 41 U/L   ALT 14 0 - 44 U/L   Alkaline Phosphatase 140 (H) 38 - 126 U/L   Total Bilirubin 1.0 0.3 - 1.2 mg/dL   GFR, Estimated >60 >60 mL/min    Comment: (NOTE) Calculated using the CKD-EPI Creatinine Equation (2021)    Anion gap 13 5 - 15    Comment: Performed at [facility], [address] [place], [place] [address]  CBC with Differential     Status: Abnormal   Collection Time: 08/06/22 12:12 PM  Result Value Ref Range   WBC 14.5 (H) 4.0 - 10.5 K/uL   RBC 5.25 (H) 3.87 - 5.11 MIL/uL   Hemoglobin 15.3 (H) 12.0 - 15.0 g/dL   HCT 47.7 (H) 36.0 - 46.0 %   MCV 90.9 80.0 - 100.0 fL   MCH 29.1 26.0 - 34.0 pg   MCHC 32.1 30.0 - 36.0 g/dL   RDW 16.4 (H) 11.5 - 15.5 %   Platelets 346 150 - 400 K/uL   nRBC 0.0 0.0 - 0.2 %   Neutrophils Relative % 80 %   Neutro Abs 11.6 (H) 1.7 - 7.7 K/uL   Lymphocytes Relative 12 %   Lymphs Abs 1.7 0.7 - 4.0 K/uL   Monocytes Relative 6 %   Monocytes Absolute 0.9 0.1 - 1.0  K/uL   Eosinophils Relative 1 %   Eosinophils Absolute 0.1 0.0 - 0.5 K/uL   Basophils Relative 0 %   Basophils Absolute 0.1 0.0 - 0.1 K/uL   Immature Granulocytes 1 %   Abs Immature Granulocytes 0.12 (H) 0.00 - 0.07 K/uL    Comment: Performed at [facility], [address] [place], [place] [address]  Creatinine, serum     Status: None   Collection Time: 08/07/22  7:02 AM  Result Value Ref Range   Creatinine, Ser 0.75 0.44 - 1.00 mg/dL   GFR, Estimated >60 >60 mL/min    Comment: (NOTE) Calculated using the CKD-EPI Creatinine Equation (2021) Performed at [facility], [address] [place], [place] [address]   CBC with Differential/Platelet     Status: Abnormal   Collection Time: 08/07/22  7:02 AM  Result Value Ref Range   WBC 17.0 (H) 4.0 - 10.5 K/uL   RBC 4.52 3.87 - 5.11 MIL/uL   Hemoglobin 13.0 12.0 - 15.0 g/dL   HCT 41.6 36.0 - 46.0 %   MCV 92.0 80.0 - 100.0 fL   MCH 28.8 26.0 - 34.0 pg   MCHC 31.3 30.0 - 36.0 g/dL   RDW 16.4 (H) 11.5 - 15.5 %   Platelets 297 150 - 400 K/uL   nRBC 0.0 0.0 - 0.2 %   Neutrophils Relative % 80 %   Neutro Abs 13.6 (H) 1.7 - 7.7 K/uL   Lymphocytes Relative 12 %   Lymphs Abs 2.0 0.7 - 4.0 K/uL   Monocytes Relative 6 %   Monocytes Absolute 1.0 0.1 - 1.0 K/uL   Eosinophils Relative 1 %   Eosinophils Absolute 0.2 0.0 - 0.5 K/uL   Basophils Relative 0 %   Basophils Absolute 0.1 0.0 - 0.1 K/uL   Immature Granulocytes 1 %   Abs Immature Granulocytes  0.11 (H) 0.00 - 0.07 K/uL    Comment: Performed at [facility], [address] [place], [place] [address]    CT Orbits W Contrast  Result Date: 08/06/2022 CLINICAL DATA:  right eye swelling EXAM: CT ORBITS WITH CONTRAST TECHNIQUE: Multidetector CT images was performed according to the standard protocol following intravenous contrast administration. RADIATION DOSE REDUCTION: This exam was performed according to the departmental dose-optimization program which  includes automated exposure control, adjustment of the mA and/or kV according to patient size and/or use of iterative reconstruction technique. CONTRAST:  76m OMNIPAQUE IOHEXOL 300 MG/ML  SOLN COMPARISON:  None Available. FINDINGS: Orbits: Right preseptal/periorbital edema. The right lacrimal gland is contiguous with the changes and is also asymmetrically enlarged and edematous suggesting involvement. In this region there may be slight postseptal extension of infectious findings. No discrete, drainable fluid collection. The globes, extraocular muscles and optic nerves are within normal limits and symmetric. Visible paranasal sinuses: Mild mucosal thickening. No air-fluid levels. Soft tissues: Right orbital findings are described above. Otherwise, unremarkable soft tissues. Osseous: No fracture or aggressive lesion. Limited intracranial: No acute or significant finding. IMPRESSION: Right preseptal/periorbital edema, compatible with cellulitis. The right lacrimal gland is contiguous with the changes and is also asymmetrically enlarged and edematous suggesting involvement. In this region there may be slight postseptal extension of infectious findings. No discrete, drainable fluid collection. Electronically Signed   By: FMargaretha SheffieldM.D.   On: 08/06/2022 16:25    Blood pressure (!) 116/54, pulse 95, temperature 98.2 F (36.8 C), resp. rate 19, height '5\' 7"'$  (1.702 m), weight 114.9 kg, SpO2 98 %.  Mental status: Alert and Oriented x 4, pleasant, in hospital bed HOB elevated.  Visual Acuity:  20/30 OD  20/30 near cc  Pupils:  Equally round/ reactive to light.  No Afferent defect.  Miotic, 1 mm OU.  Motility:  Full/ orthophoric  Visual Fields:  Full to confrontation  IOP:  soft to palpation  External/ Lids/ Lashes:  2+ edema, 1+ erythema right upper lid.  Palpebral fissure <3 mm OD, 9 mm OS.  Anterior Segment:  Conjunctiva:  Normal  OU  Cornea:  Normal  OU  Anterior Chamber: Normal   OU  Lens:   Nuclear sclerosis OU     Assessment/Plan:  Cellulitis, right eye.  External bacterial infection, resistent to azithromycin, improving on IV antibiotics (vanc, cipro).  Allergic to penicillin.  Reassuring eye exam, no signs of optic neuropathy on exam or intraocular infection or inflammation.  Consider transition to oral antibiotics, such as fluoroquinolone.  [facility]  Please reconsult if there are complaints of vision changes or clinical worsening.  BBenay Pillow3/14/2024, 12:19 PM

## 2022-08-07 NOTE — Plan of Care (Signed)
  Problem: Clinical Measurements: Goal: Ability to maintain clinical measurements within normal limits will improve Outcome: Progressing   Problem: Elimination: Goal: Will not experience complications related to bowel motility Outcome: Progressing Goal: Will not experience complications related to urinary retention Outcome: Progressing   Problem: Pain Managment: Goal: General experience of comfort will improve Outcome: Progressing   Problem: Safety: Goal: Ability to remain free from injury will improve Outcome: Progressing   Problem: Skin Integrity: Goal: Risk for impaired skin integrity will decrease Outcome: Progressing

## 2022-08-07 NOTE — Progress Notes (Signed)
Progress Note    Yvette Phillips  [id number] DOB: [birth year]  DOA: 08/06/2022 PCP: Eddie North      Brief Narrative:    Medical records reviewed and are as summarized below:  Yvette Phillips is a 62 y.o. 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  She saw an ophthalmologist at [facility] on 08/01/2022.  She was given azithromycin for infection around the right eye.  She states she has noticed some improvement in the swelling around the right eye but it has not completely resolved.  She said she had pressure behind the right eye prior to starting the antibiotics and she thinks that the pressure behind the right eye has improved.  She has no pain in the right eye.  She has increased watery discharge from the right eye.  No  abnormal vision or change in vision, fever, chills, headache, dizziness, chest pain, shortness of breath, vomiting, diarrhea, abdominal pain.  She said she was initially wheelchair-bound but she has been able to ambulate with a walker for the past 2 months      Assessment/Plan:   Principal Problem:   Preseptal cellulitis of right eye    Body mass index is 39.67 kg/m.  (Obesity)   Right eye preseptal cellulitis, suspected postseptal extension, leukocytosis: Continue IV vancomycin and ciprofloxacin.  Follow-up with Dr. Edison Pace, ophthalmologist.     Seizure disorder: Continue Keppra     Depression: Continue Cymbalta     History of central cord syndrome at C4: She said she is able to ambulate with a walker.  Fall precautions.  Tizanidine on hold while on ciprofloxacin.     Diet Order             Diet Heart Fluid consistency: Thin  Diet effective now                             Consultants: Ophthalmologist  Procedures: None    Medications:    baclofen  5 mg Oral BID   DULoxetine  90 mg Oral Daily   enoxaparin (LOVENOX) injection  0.5 mg/kg Subcutaneous Q24H   fesoterodine  4 mg Oral Daily   levETIRAcetam  500 mg Oral BID   pantoprazole  20 mg Oral BID   senna-docusate  2 tablet Oral QHS   trimethoprim  100 mg Oral Daily   Continuous Infusions:  ciprofloxacin 400 mg (08/07/22 0949)   vancomycin       Anti-infectives (From admission, onward)    Start     Dose/Rate Route Frequency Ordered Stop   08/07/22 1400  vancomycin (VANCOREADY) IVPB 2000 mg/400 mL        2,000 mg 200 mL/hr over 120 Minutes Intravenous Every 24 hours 08/06/22 1914     08/07/22 1000  trimethoprim (TRIMPEX) tablet 100 mg        100 mg Oral Daily 08/06/22 1936     08/07/22 0830  ciprofloxacin (CIPRO) IVPB 400 mg        400 mg 200 mL/hr over 60 Minutes Intravenous Every 12 hours 08/07/22 0742     08/06/22 2000  vancomycin (VANCOREADY) IVPB 1500 mg/300 mL  Status:  Discontinued        1,500 mg 150 mL/hr over 120 Minutes  Intravenous  Once 08/06/22 1852 08/06/22 1910   08/06/22 1930  vancomycin (VANCOREADY) IVPB 2000 mg/400 mL        2,000 mg 200 mL/hr over 120 Minutes Intravenous  Once 08/06/22 1911 08/06/22 2156   08/06/22 1700  vancomycin (VANCOCIN) IVPB 1000 mg/200 mL premix  Status:  Discontinued        1,000 mg 200 mL/hr over 60 Minutes Intravenous  Once 08/06/22 1648 08/06/22 1910   08/06/22 1700  ciprofloxacin (CIPRO) IVPB 400 mg        400 mg 200 mL/hr over 60 Minutes Intravenous  Once 08/06/22 1648 08/06/22 1834              Family Communication/Anticipated D/C date and plan/Code Status   DVT prophylaxis:      Code Status: Full Code  Family Communication: None Disposition Plan: Plan to discharge home in 1 to 2 days   Status is: Inpatient Remains inpatient appropriate because: IV antibiotics for preseptal  cellulitis       Subjective:   Interval events noted.  No pain in the right eye.  She cannot see out of her right eye without any problems.  She feels there is still swelling around the right eye.  She had 1 episode of loose stools last night.  She also complains of heartburn.  No diarrhea today.  Objective:    Vitals:   08/06/22 1928 08/06/22 2135 08/07/22 0445 08/07/22 0802  BP: (!) 136/99 139/70 137/64 (!) 116/54  Pulse: (!) 113 (!) 103 (!) 101 95  Resp: '15 18 18 19  '$ Temp: 98.3 F (36.8 C) 97.7 F (36.5 C) 98.3 F (36.8 C) 98.2 F (36.8 C)  TempSrc: Oral Oral    SpO2: 99% 100% 100% 98%  Weight: 114.9 kg     Height: '5\' 7"'$  (1.702 m)      No data found.   Intake/Output Summary (Last 24 hours) at 08/07/2022 1103 Last data filed at 08/07/2022 1031 Gross per 24 hour  Intake 510.77 ml  Output 1 ml  Net 509.77 ml   Filed Weights   08/06/22 1206 08/06/22 1928  Weight: (!) 136.3 kg 114.9 kg    Exam:  GEN: NAD SKIN: No rash EYES: Mild right periorbital erythema and swelling, PERRLA, EOMI, vision is okay in both eyes ENT: MMM CV: RRR PULM: CTA B ABD: soft, obese, NT, +BS CNS: AAO x 3, non focal EXT: No edema or tenderness        Data Reviewed:   I have personally reviewed following labs and imaging studies:  Labs: Labs show the following:   Basic Metabolic Panel: Recent Labs  Lab 08/06/22 1212 08/07/22 0702  NA 135  --   K 3.7  --   CL 104  --   CO2 18*  --   GLUCOSE 101*  --   BUN 11  --   CREATININE 0.85 0.75  CALCIUM 9.3  --    GFR Estimated Creatinine Clearance: 94.2 mL/min (by C-G formula based on SCr of 0.75 mg/dL). Liver Function Tests: Recent Labs  Lab 08/06/22 1212  AST 20  ALT 14  ALKPHOS 140*  BILITOT 1.0  PROT 7.8  ALBUMIN 3.1*   No results for input(s): "LIPASE", "AMYLASE" in the last 168 hours. No results for input(s): "AMMONIA" in the last 168 hours. Coagulation profile No results for input(s): "INR", "PROTIME" in  the last 168 hours.  CBC: Recent Labs  Lab 08/06/22 1212 08/07/22 0702  WBC 14.5* 17.0*  NEUTROABS 11.6* 13.6*  HGB 15.3* 13.0  HCT 47.7* 41.6  MCV 90.9 92.0  PLT 346 297   Cardiac Enzymes: No results for input(s): "CKTOTAL", "CKMB", "CKMBINDEX", "TROPONINI" in the last 168 hours. BNP (last 3 results) No results for input(s): "PROBNP" in the last 8760 hours. CBG: No results for input(s): "GLUCAP" in the last 168 hours. D-Dimer: No results for input(s): "DDIMER" in the last 72 hours. Hgb A1c: No results for input(s): "HGBA1C" in the last 72 hours. Lipid Profile: No results for input(s): "CHOL", "HDL", "LDLCALC", "TRIG", "CHOLHDL", "LDLDIRECT" in the last 72 hours. Thyroid function studies: No results for input(s): "TSH", "T4TOTAL", "T3FREE", "THYROIDAB" in the last 72 hours.  Invalid input(s): "FREET3" Anemia work up: No results for input(s): "VITAMINB12", "FOLATE", "FERRITIN", "TIBC", "IRON", "RETICCTPCT" in the last 72 hours. Sepsis Labs: Recent Labs  Lab 08/06/22 1212 08/07/22 0702  WBC 14.5* 17.0*  LATICACIDVEN 1.7  --     Microbiology No results found for this or any previous visit (from the past 240 hour(s)).  Procedures and diagnostic studies:  CT Orbits W Contrast  Result Date: 08/06/2022 CLINICAL DATA:  right eye swelling EXAM: CT ORBITS WITH CONTRAST TECHNIQUE: Multidetector CT images was performed according to the standard protocol following intravenous contrast administration. RADIATION DOSE REDUCTION: This exam was performed according to the departmental dose-optimization program which includes automated exposure control, adjustment of the mA and/or kV according to patient size and/or use of iterative reconstruction technique. CONTRAST:  91m OMNIPAQUE IOHEXOL 300 MG/ML  SOLN COMPARISON:  None Available. FINDINGS: Orbits: Right preseptal/periorbital edema. The right lacrimal gland is contiguous with the changes and is also asymmetrically enlarged and  edematous suggesting involvement. In this region there may be slight postseptal extension of infectious findings. No discrete, drainable fluid collection. The globes, extraocular muscles and optic nerves are within normal limits and symmetric. Visible paranasal sinuses: Mild mucosal thickening. No air-fluid levels. Soft tissues: Right orbital findings are described above. Otherwise, unremarkable soft tissues. Osseous: No fracture or aggressive lesion. Limited intracranial: No acute or significant finding. IMPRESSION: Right preseptal/periorbital edema, compatible with cellulitis. The right lacrimal gland is contiguous with the changes and is also asymmetrically enlarged and edematous suggesting involvement. In this region there may be slight postseptal extension of infectious findings. No discrete, drainable fluid collection. Electronically Signed   By: FMargaretha SheffieldM.D.   On: 08/06/2022 16:25               LOS: 1 day   Chalice Philbert  [facility]   Pager on [email or web address] If 7PM-7AM, please contact night-coverage at [email or web address]     08/07/2022, 11:03 AM

## 2022-08-08 LAB — CBC WITH DIFFERENTIAL/PLATELET
Abs Immature Granulocytes: 0.07 10*3/uL (ref 0.00–0.07)
Basophils Absolute: 0.1 10*3/uL (ref 0.0–0.1)
Basophils Relative: 1 %
Eosinophils Absolute: 0.2 10*3/uL (ref 0.0–0.5)
Eosinophils Relative: 2 %
HCT: 40.4 % (ref 36.0–46.0)
Hemoglobin: 12.7 g/dL (ref 12.0–15.0)
Immature Granulocytes: 1 %
Lymphocytes Relative: 19 %
Lymphs Abs: 1.8 10*3/uL (ref 0.7–4.0)
MCH: 29 pg (ref 26.0–34.0)
MCHC: 31.4 g/dL (ref 30.0–36.0)
MCV: 92.2 fL (ref 80.0–100.0)
Monocytes Absolute: 0.8 10*3/uL (ref 0.1–1.0)
Monocytes Relative: 9 %
Neutro Abs: 6.2 10*3/uL (ref 1.7–7.7)
Neutrophils Relative %: 68 %
Platelets: 289 10*3/uL (ref 150–400)
RBC: 4.38 MIL/uL (ref 3.87–5.11)
RDW: 16.3 % — ABNORMAL HIGH (ref 11.5–15.5)
WBC: 9.1 10*3/uL (ref 4.0–10.5)
nRBC: 0 % (ref 0.0–0.2)

## 2022-08-08 LAB — URINALYSIS, ROUTINE W REFLEX MICROSCOPIC
Bilirubin Urine: NEGATIVE
Glucose, UA: NEGATIVE mg/dL
Hgb urine dipstick: NEGATIVE
Ketones, ur: NEGATIVE mg/dL
Nitrite: NEGATIVE
Protein, ur: 30 mg/dL — AB
RBC / HPF: >50 RBC/hpf (ref 0–5)
Specific Gravity, Urine: 1.033 — ABNORMAL HIGH (ref 1.005–1.030)
WBC, UA: >50 WBC/hpf (ref 0–5)
pH: 5 (ref 5.0–8.0)

## 2022-08-08 LAB — LACTIC ACID, PLASMA
Lactic Acid, Venous: 0.9 mmol/L (ref 0.5–1.9)
Lactic Acid, Venous: 1.1 mmol/L (ref 0.5–1.9)

## 2022-08-08 MED ORDER — LEVOFLOXACIN 500 MG PO TABS: 500.0000 mg | ORAL_TABLET | Freq: Every day | ORAL | 0 refills | Status: AC

## 2022-08-08 MED ORDER — POLYETHYLENE GLYCOL 3350 17 G PO PACK: 17.0000 g | PACK | Freq: Every day | ORAL | Status: AC

## 2022-08-08 NOTE — Care Management Important Message (Signed)
Important Message  Patient Details  Name: Yvette Phillips MRN: [id number] Date of Birth: 1960-06-12   Medicare Important Message Given:  N/A - LOS <3 / Initial given by admissions     Dannette Barbara 08/08/2022, 8:33 AM

## 2022-08-08 NOTE — Discharge Summary (Signed)
Physician Discharge Summary   Patient: Yvette Phillips MRN: [id number] DOB: [birth year]  Admit date:     08/06/2022  Discharge date:   Discharge Physician: Jennye Boroughs   PCP: Eddie North   Recommendations at discharge:   Follow-up with PCP in 1 week Follow-up with ophthalmologist at [facility] eye clinic in 1 week  Discharge Diagnoses: Principal Problem:   Preseptal cellulitis of right eye  Resolved Problems:   * No resolved hospital problems. [facility] Course:  Yvette Phillips is a 62 y.o. 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  She saw an ophthalmologist at [facility] on 08/01/2022.  She was given azithromycin for infection around the right eye.  She states she has noticed some improvement in the swelling around the right eye but it has not completely resolved.  She said she had pressure behind the right eye prior to starting the antibiotics and she thinks that the pressure behind the right eye has improved.  She has no pain in the right eye.  She has increased watery discharge from the right eye.  No  abnormal vision or change in vision, fever, chills, headache, dizziness, chest pain, shortness of breath, vomiting, diarrhea, abdominal pain.  She said she was initially wheelchair-bound but she has been able to ambulate with a walker for the past 2 months      Assessment and Plan:  Right eye preseptal cellulitis, suspected postseptal extension, leukocytosis: Improved.  She will be discharged on Levaquin.  Follow-up with Dr. Edison Pace, ophthalmologist, as an outpatient.       Seizure disorder: Continue Keppra     Depression: Continue Cymbalta     History of central cord syndrome at C4: She said she is able to ambulate with a  walker.  Outpatient follow-up with PCP and neurosurgeon       Consultants: Ophthalmologist Procedures performed: None Disposition: Home Diet recommendation:  Discharge Diet Orders (From admission, onward)     Start     Ordered   08/08/22 0000  Diet - low sodium heart healthy        08/08/22 1110           Cardiac diet DISCHARGE MEDICATION: Allergies as of 08/08/2022       Reactions   Codeine Anaphylaxis   Penicillins Anaphylaxis   Dantrolene Other (See Comments)   Pt had confusion, dizziness and "off" with 50 mg Dantrolene   Lyrica [pregabalin] Other (See Comments)   Edema   Penicillin G         Medication List     STOP taking these medications    azithromycin 500 MG tablet Commonly known as: ZITHROMAX   bisacodyl 10 MG suppository Commonly known as: DULCOLAX   cyanocobalamin 2000 MCG tablet   famotidine 20 MG tablet Commonly known as: PEPCID   folic acid 1 MG tablet Commonly known as: FOLVITE   Gerhardt's butt cream Crea   lidocaine 5 % Commonly known as: LIDODERM   multivitamin with minerals Tabs tablet   thiamine 100 MG tablet Commonly known as: VITAMIN B1   Voltaren 1 % Gel Generic drug: diclofenac Sodium   white petrolatum Oint Commonly known as: VASELINE       TAKE these medications    Acetaminophen Extra Strength 500 MG Tabs Take 1 tablet  by mouth 3 (three) times daily as needed (for increased pain).   Baclofen 5 MG Tabs Take 5 mg by mouth 2 (two) times daily.   Cymbalta 30 MG capsule Generic drug: DULoxetine Take 90 mg by mouth at bedtime.   furosemide 20 MG tablet Commonly known as: LASIX Take 10-20 mg by mouth daily. Takes 1/2 tablet on Mondays,Wednesdays, Fridays. And Sundays and 1 tablet on Tuesday, Thursday and Saturday   Keppra 500 MG tablet Generic drug: levETIRAcetam Take 500 mg by mouth 2 (two) times daily.   levofloxacin 500 MG tablet Commonly known as: Levaquin Take 1 tablet (500 mg total) by mouth  daily for 4 days. Start taking on: August 09, 2022   meloxicam 7.5 MG tablet Commonly known as: MOBIC Take 7.5 mg by mouth daily.   polyethylene glycol 17 g packet Commonly known as: MIRALAX / GLYCOLAX Take 17 g by mouth daily.   potassium chloride 10 MEQ tablet Commonly known as: KLOR-CON Take 10 mEq by mouth daily.   Protonix 20 MG tablet Generic drug: pantoprazole Take 20 mg by mouth 2 (two) times daily.   senna-docusate 8.6-50 MG tablet Commonly known as: Senokot-S Take 2 tablets by mouth at bedtime.   tiZANidine 4 MG tablet Commonly known as: ZANAFLEX Take 4 mg by mouth in the morning and at bedtime.   Toviaz 4 MG Tb24 tablet Generic drug: fesoterodine Take 4 mg by mouth daily.   traMADol 50 MG tablet Commonly known as: ULTRAM Take 50 mg by mouth every 8 (eight) hours as needed for moderate pain.   trimethoprim 100 MG tablet Commonly known as: TRIMPEX Take 1 tablet (100 mg total) by mouth daily.   trolamine salicylate 10 % cream Commonly known as: ASPERCREME Apply 1 Application topically 3 (three) times daily as needed for muscle pain.   Tums 500 MG chewable tablet Generic drug: calcium carbonate Chew 2 tablets by mouth 4 (four) times daily as needed for indigestion or heartburn.   VITAMIN D (ERGOCALCIFEROL) PO Take 1,000 Units by mouth every 7 (seven) days.        Follow-up Information     Eulogio Bear, MD .   Specialty: Ophthalmology Contact information: [address] [place] [address] [phone]                Discharge Exam: Danley Danker Weights   08/06/22 1206 08/06/22 1928  Weight: (!) 136.3 kg 114.9 kg   GEN: NAD SKIN: No rash EYES: EOMI, PERRLA, periorbital swelling and erythema have improved ENT: MMM CV: RRR PULM: CTA B ABD: soft, ND, NT, +BS CNS: AAO x 3, non focal EXT: No edema or tenderness   Condition at discharge: good  The results of significant diagnostics from this hospitalization (including  imaging, microbiology, ancillary and laboratory) are listed below for reference.   Imaging Studies: CT Orbits W Contrast  Result Date: 08/06/2022 CLINICAL DATA:  right eye swelling EXAM: CT ORBITS WITH CONTRAST TECHNIQUE: Multidetector CT images was performed according to the standard protocol following intravenous contrast administration. RADIATION DOSE REDUCTION: This exam was performed according to the departmental dose-optimization program which includes automated exposure control, adjustment of the mA and/or kV according to patient size and/or use of iterative reconstruction technique. CONTRAST:  66mL OMNIPAQUE IOHEXOL 300 MG/ML  SOLN COMPARISON:  None Available. FINDINGS: Orbits: Right preseptal/periorbital edema. The right lacrimal gland is contiguous with the changes and is also asymmetrically enlarged and edematous suggesting involvement. In this region there may be slight postseptal extension of  infectious findings. No discrete, drainable fluid collection. The globes, extraocular muscles and optic nerves are within normal limits and symmetric. Visible paranasal sinuses: Mild mucosal thickening. No air-fluid levels. Soft tissues: Right orbital findings are described above. Otherwise, unremarkable soft tissues. Osseous: No fracture or aggressive lesion. Limited intracranial: No acute or significant finding. IMPRESSION: Right preseptal/periorbital edema, compatible with cellulitis. The right lacrimal gland is contiguous with the changes and is also asymmetrically enlarged and edematous suggesting involvement. In this region there may be slight postseptal extension of infectious findings. No discrete, drainable fluid collection. Electronically Signed   By: Margaretha Sheffield M.D.   On: 08/06/2022 16:25    Microbiology: Results for orders placed or performed in visit on 12/17/20  CULTURE, URINE COMPREHENSIVE     Status: Abnormal   Collection Time: 12/17/20  2:39 PM   Specimen: Urine   UR  Result  Value Ref Range Status   Urine Culture, Comprehensive Final report (A)  Final   Organism ID, Bacteria Escherichia coli (A)  Final    Comment: Cefazolin <=4 ug/mL Cefazolin with an MIC <=16 predicts susceptibility to the oral agents cefaclor, cefdinir, cefpodoxime, cefprozil, cefuroxime, cephalexin, and loracarbef when used for therapy of uncomplicated urinary tract infections due to E. coli, Klebsiella pneumoniae, and Proteus mirabilis. Greater than 100,000 colony forming units per mL    ANTIMICROBIAL SUSCEPTIBILITY Comment  Final    Comment:       ** S = Susceptible; I = Intermediate; R = Resistant **                    P = Positive; N = Negative             MICS are expressed in micrograms per mL    Antibiotic                 RSLT#1    RSLT#2    RSLT#3    RSLT#4 Amoxicillin/Clavulanic Acid    S Ampicillin                     S Cefepime                       S Ceftriaxone                    S Cefuroxime                     S Ciprofloxacin                  S Ertapenem                      S Gentamicin                     S Imipenem                       S Levofloxacin                   S Meropenem                      S Nitrofurantoin                 S Piperacillin/Tazobactam        S Tetracycline  S Tobramycin                     S Trimethoprim/Sulfa             S   Microscopic Examination     Status: Abnormal   Collection Time: 12/17/20  2:39 PM   Urine  Result Value Ref Range Status   WBC, UA 11-30 (A) 0 - 5 /hpf Final   RBC, Urine 0-2 0 - 2 /hpf Final   Epithelial Cells (non renal) 0-10 0 - 10 /hpf Final   Bacteria, UA Many (A) None seen/Few Final    Labs: CBC: Recent Labs  Lab 08/06/22 1212 08/07/22 0702 08/08/22 0705  WBC 14.5* 17.0* 9.1  NEUTROABS 11.6* 13.6* 6.2  HGB 15.3* 13.0 12.7  HCT 47.7* 41.6 40.4  MCV 90.9 92.0 92.2  PLT 346 297 [id number]   Basic Metabolic Panel: Recent Labs  Lab 08/06/22 1212 08/07/22 0702  NA 135  --   K 3.7   --   CL 104  --   CO2 18*  --   GLUCOSE 101*  --   BUN 11  --   CREATININE 0.85 0.75  CALCIUM 9.3  --    Liver Function Tests: Recent Labs  Lab 08/06/22 1212  AST 20  ALT 14  ALKPHOS 140*  BILITOT 1.0  PROT 7.8  ALBUMIN 3.1*   CBG: No results for input(s): "GLUCAP" in the last 168 hours.  Discharge time spent: greater than 30 minutes.  Signed: Jennye Boroughs, MD [facility] 08/08/2022

## 2023-06-09 ENCOUNTER — Other Ambulatory Visit: Payer: Self-pay | Admitting: Family Medicine

## 2023-06-09 DIAGNOSIS — Z1231 Encounter for screening mammogram for malignant neoplasm of breast: Secondary | ICD-10-CM

## 2023-09-01 ENCOUNTER — Ambulatory Visit
Admission: RE | Admit: 2023-09-01 | Discharge: 2023-09-01 | Disposition: A | Discharge status: Home / Self Care | Payer: Medicare (Managed Care) | Source: Ambulatory Visit | Attending: Family Medicine | Admitting: Family Medicine

## 2023-09-01 DIAGNOSIS — Z1231 Encounter for screening mammogram for malignant neoplasm of breast: Secondary | ICD-10-CM | POA: Insufficient documentation

## 2024-05-04 ENCOUNTER — Other Ambulatory Visit: Payer: Self-pay

## 2024-05-04 ENCOUNTER — Telehealth: Payer: Self-pay

## 2024-05-04 DIAGNOSIS — F17210 Nicotine dependence, cigarettes, uncomplicated: Secondary | ICD-10-CM

## 2024-05-04 DIAGNOSIS — Z87891 Personal history of nicotine dependence: Secondary | ICD-10-CM

## 2024-05-04 DIAGNOSIS — Z122 Encounter for screening for malignant neoplasm of respiratory organs: Secondary | ICD-10-CM

## 2024-05-04 NOTE — Telephone Encounter (Signed)
 Lung Cancer Screening Narrative/Criteria Questionnaire (Cigarette Smokers Only- No Cigars/Pipes/vapes)   Yvette Phillips   SDMV:05/05/2024 2:30 pm [place]        February 26, 1961               LDCT: 06/09/2023 at 10:30 am [facility] scheduled by [facility]    63 y.o.   Phone: [phone]  Lung Screening Narrative (confirm age 53-77 yrs Medicare / 50-80 yrs Private pay insurance)   Insurance information: [facility]/Medicaid   Referring Provider:Lars   This screening involves an initial phone call with a team member from our program. It is called a shared decision making visit. The initial meeting is required by  insurance and Medicare to make sure you understand the program. This appointment takes about 15-20 minutes to complete. You will complete the screening scan at your scheduled date/time.  This scan takes about 5-10 minutes to complete. You can eat and drink normally before and after the scan.  Criteria questions for Lung Cancer Screening:   Are you a current or former smoker? Current Age began smoking: 14   If you are a former smoker, what year did you quit smoking? Never Quit (within 15 yrs)   To calculate your smoking history, I need an accurate estimate of how many packs of cigarettes you smoked per day and for how many years. (Not just the number of PPD you are now smoking)   Years smoking 48 x Packs per day .5 = Pack years 24   (at least 20 pack yrs)   (Make sure they understand that we need to know how much they have smoked in the past, not just the number of PPD they are smoking now)  Do you have a personal history of cancer?  No    Do you have a family history of cancer? No  Are you coughing up blood?  No  Have you had unexplained weight loss of 15 lbs or more in the last 6 months? No  It looks like you meet all criteria.  When would be a good time for us  to schedule you for this screening?   Additional information: N/A

## 2024-05-05 ENCOUNTER — Encounter

## 2024-05-05 ENCOUNTER — Telehealth (INDEPENDENT_AMBULATORY_CARE_PROVIDER_SITE_OTHER)

## 2024-05-05 DIAGNOSIS — Z539 Procedure and treatment not carried out, unspecified reason: Secondary | ICD-10-CM

## 2024-05-05 NOTE — Telephone Encounter (Cosign Needed)
 Was able to reach patient after several attempts for Shared Decision Making visit. Patient was unable to complete phone call at this time as she forgot about the appointment. Patient would like to reschedule SDMV and requests a call back tomorrow to do so.

## 2024-05-11 ENCOUNTER — Encounter: Payer: Self-pay | Admitting: Emergency Medicine

## 2024-06-08 ENCOUNTER — Ambulatory Visit: Admission: RE | Admit: 2024-06-08 | Source: Ambulatory Visit

## 2024-06-21 NOTE — Addendum Note (Signed)
 Addended by: DIONISIO AQUAS on: 06/21/2024 03:37 PM   Modules accepted: Orders

## 2024-06-24 ENCOUNTER — Ambulatory Visit: Admitting: Adult Health

## 2024-06-24 ENCOUNTER — Encounter: Payer: Self-pay | Admitting: Adult Health

## 2024-06-24 DIAGNOSIS — F17210 Nicotine dependence, cigarettes, uncomplicated: Secondary | ICD-10-CM

## 2024-06-24 NOTE — Patient Instructions (Signed)

## 2024-06-24 NOTE — Progress Notes (Signed)
" °  Virtual Visit via Telephone Note  I connected with Yvette Phillips , 06/24/24 10:20 AM by a telemedicine application and verified that I am speaking with the correct person using two identifiers.  Location: Patient: home Provider: home   I discussed the limitations of evaluation and management by telemedicine and the availability of in person appointments. The patient expressed understanding and agreed to proceed.   Shared Decision Making Visit Lung Cancer Screening Program [phone])   Eligibility: 64 y.o. Pack Years Smoking History Calculation = 24 pack years (# packs/per year x # years smoked) Recent History of coughing up blood  no Unexplained weight loss? no ( >Than 15 pounds within the last 6 months ) Prior History Lung / other cancer no (Diagnosis within the last 5 years already requiring surveillance chest CT Scans). Smoking Status Current Smoker   Visit Components: Discussion included one or more decision making aids. YES Discussion included risk/benefits of screening. YES Discussion included potential follow up diagnostic testing for abnormal scans. YES Discussion included meaning and risk of over diagnosis. YES Discussion included meaning and risk of False Positives. YES Discussion included meaning of total radiation exposure. YES  Counseling Included: Importance of adherence to annual lung cancer LDCT screening. YES Impact of comorbidities on ability to participate in the program. YES Ability and willingness to under diagnostic treatment. YES  Smoking Cessation Counseling: Current Smokers:  Discussed importance of smoking cessation. yes Information about tobacco cessation classes and interventions provided to patient. yes Patient provided with ticket for LDCT Scan. yes Symptomatic Patient. NO Diagnosis Code: Tobacco Use Z72.0 Asymptomatic Patient yes  Counseling - 4 minutes of smoking cessation counseling  (CT Chest Lung Cancer Screening Low Dose W/O CM)  [id number]  Smoking/Tobacco Cessation Counseling Yvette Phillips is a current user of tobacco or nicotine  products. She is considering quitting at this time. Counseling provided today addressed the risks of continued use and the benefits of cessation. Discussed tobacco/nicotine  use history, readiness to quit, and evidence-based treatment options including behavioral strategies, support resources, and pharmacologic therapies. Provided encouragement and educational materials on steps and resources to quit smoking. Patient questions were addressed, and follow-up recommended for continued support. Total time spent on counseling: 3 minutes.    Z12.2-Screening of respiratory organs Z87.891-Personal history of nicotine  dependence   Yvette Phillips 06/24/24      "

## 2024-07-15 ENCOUNTER — Ambulatory Visit
